# Patient Record
Sex: Male | Born: 1940 | Race: Black or African American | Hispanic: No | Marital: Married | Smoking: Never smoker
Health system: Southern US, Community
[De-identification: ages and names within clinical notes are randomized; demographics above are authoritative.]

## PROBLEM LIST (undated history)

## (undated) DIAGNOSIS — E119 Type 2 diabetes mellitus without complications: Secondary | ICD-10-CM

## (undated) DIAGNOSIS — I10 Essential (primary) hypertension: Secondary | ICD-10-CM

## (undated) DIAGNOSIS — I509 Heart failure, unspecified: Secondary | ICD-10-CM

## (undated) DIAGNOSIS — I499 Cardiac arrhythmia, unspecified: Secondary | ICD-10-CM

## (undated) DIAGNOSIS — B999 Unspecified infectious disease: Secondary | ICD-10-CM

## (undated) DIAGNOSIS — K859 Acute pancreatitis without necrosis or infection, unspecified: Secondary | ICD-10-CM

## (undated) HISTORY — PX: CHOLECYSTECTOMY: SHX55

## (undated) HISTORY — PX: ABOVE KNEE LEG AMPUTATION: SUR20

## (undated) HISTORY — PX: EYE SURGERY: SHX253

---

## 2003-05-09 ENCOUNTER — Other Ambulatory Visit: Payer: Self-pay

## 2003-05-12 ENCOUNTER — Other Ambulatory Visit: Payer: Self-pay

## 2003-05-13 ENCOUNTER — Other Ambulatory Visit: Payer: Self-pay

## 2003-08-13 ENCOUNTER — Other Ambulatory Visit: Payer: Self-pay

## 2004-10-11 ENCOUNTER — Ambulatory Visit: Payer: Self-pay | Admitting: Specialist

## 2005-08-26 ENCOUNTER — Other Ambulatory Visit: Payer: Self-pay

## 2005-08-26 ENCOUNTER — Inpatient Hospital Stay: Payer: Self-pay | Admitting: Specialist

## 2011-04-17 ENCOUNTER — Ambulatory Visit: Payer: Self-pay | Admitting: Internal Medicine

## 2011-10-27 DIAGNOSIS — E785 Hyperlipidemia, unspecified: Secondary | ICD-10-CM | POA: Insufficient documentation

## 2011-10-27 DIAGNOSIS — I4891 Unspecified atrial fibrillation: Secondary | ICD-10-CM | POA: Insufficient documentation

## 2011-10-27 DIAGNOSIS — K219 Gastro-esophageal reflux disease without esophagitis: Secondary | ICD-10-CM | POA: Insufficient documentation

## 2011-10-27 DIAGNOSIS — E7800 Pure hypercholesterolemia, unspecified: Secondary | ICD-10-CM | POA: Insufficient documentation

## 2012-02-19 DIAGNOSIS — I10 Essential (primary) hypertension: Secondary | ICD-10-CM | POA: Insufficient documentation

## 2012-02-19 DIAGNOSIS — J45909 Unspecified asthma, uncomplicated: Secondary | ICD-10-CM | POA: Insufficient documentation

## 2012-02-20 ENCOUNTER — Emergency Department: Payer: Self-pay | Admitting: Emergency Medicine

## 2012-02-20 LAB — COMPREHENSIVE METABOLIC PANEL
Albumin: 3.5 g/dL (ref 3.4–5.0)
Alkaline Phosphatase: 66 U/L (ref 50–136)
Anion Gap: 11 (ref 7–16)
Calcium, Total: 9.1 mg/dL (ref 8.5–10.1)
Co2: 24 mmol/L (ref 21–32)
EGFR (Non-African Amer.): 45 — ABNORMAL LOW
Glucose: 146 mg/dL — ABNORMAL HIGH (ref 65–99)
Osmolality: 296 (ref 275–301)
Potassium: 4.1 mmol/L (ref 3.5–5.1)
SGOT(AST): 67 U/L — ABNORMAL HIGH (ref 15–37)

## 2012-02-20 LAB — CBC
HCT: 45.2 % (ref 40.0–52.0)
HGB: 15.4 g/dL (ref 13.0–18.0)
MCHC: 33.9 g/dL (ref 32.0–36.0)
MCV: 89 fL (ref 80–100)
RDW: 14.6 % — ABNORMAL HIGH (ref 11.5–14.5)
WBC: 9.2 10*3/uL (ref 3.8–10.6)

## 2012-02-22 ENCOUNTER — Observation Stay: Payer: Self-pay | Admitting: Internal Medicine

## 2012-02-22 LAB — CBC WITH DIFFERENTIAL/PLATELET
Basophil #: 0.1 10*3/uL (ref 0.0–0.1)
Eosinophil %: 0.6 %
HCT: 44.3 % (ref 40.0–52.0)
Lymphocyte #: 2.5 10*3/uL (ref 1.0–3.6)
Lymphocyte %: 26 %
MCH: 30.2 pg (ref 26.0–34.0)
MCHC: 33.7 g/dL (ref 32.0–36.0)
Monocyte %: 12.1 %
Neutrophil %: 60.6 %
RDW: 14.3 % (ref 11.5–14.5)

## 2012-02-22 LAB — BASIC METABOLIC PANEL
Calcium, Total: 9.1 mg/dL (ref 8.5–10.1)
Co2: 29 mmol/L (ref 21–32)
Creatinine: 1.69 mg/dL — ABNORMAL HIGH (ref 0.60–1.30)
EGFR (African American): 47 — ABNORMAL LOW
EGFR (Non-African Amer.): 40 — ABNORMAL LOW
Glucose: 124 mg/dL — ABNORMAL HIGH (ref 65–99)
Osmolality: 296 (ref 275–301)
Sodium: 146 mmol/L — ABNORMAL HIGH (ref 136–145)

## 2012-02-22 LAB — CK TOTAL AND CKMB (NOT AT ARMC)
CK, Total: 92 U/L (ref 35–232)
CK, Total: 92 U/L (ref 35–232)
CK-MB: 1.4 ng/mL (ref 0.5–3.6)
CK-MB: 1.3 ng/mL (ref 0.5–3.6)

## 2012-02-22 LAB — HEPATIC FUNCTION PANEL A (ARMC)
Alkaline Phosphatase: 61 U/L (ref 50–136)
Bilirubin, Direct: 0.4 mg/dL — ABNORMAL HIGH (ref 0.00–0.20)
Bilirubin,Total: 1.4 mg/dL — ABNORMAL HIGH (ref 0.2–1.0)

## 2012-02-22 LAB — TROPONIN I: Troponin-I: 0.03 ng/mL

## 2012-02-22 LAB — PRO B NATRIURETIC PEPTIDE: B-Type Natriuretic Peptide: 3786 pg/mL — ABNORMAL HIGH (ref 0–125)

## 2012-02-23 LAB — BASIC METABOLIC PANEL
Anion Gap: 11 (ref 7–16)
BUN: 23 mg/dL — ABNORMAL HIGH (ref 7–18)
Chloride: 108 mmol/L — ABNORMAL HIGH (ref 98–107)
Co2: 24 mmol/L (ref 21–32)
Creatinine: 1.41 mg/dL — ABNORMAL HIGH (ref 0.60–1.30)
EGFR (African American): 58 — ABNORMAL LOW
EGFR (Non-African Amer.): 50 — ABNORMAL LOW
Glucose: 103 mg/dL — ABNORMAL HIGH (ref 65–99)
Osmolality: 289 (ref 275–301)
Potassium: 3.8 mmol/L (ref 3.5–5.1)
Sodium: 143 mmol/L (ref 136–145)

## 2012-02-23 LAB — CBC WITH DIFFERENTIAL/PLATELET
Basophil #: 0.1 10*3/uL (ref 0.0–0.1)
Basophil %: 0.8 %
Eosinophil %: 1.2 %
HCT: 43.5 % (ref 40.0–52.0)
HGB: 14.3 g/dL (ref 13.0–18.0)
Lymphocyte #: 2.5 10*3/uL (ref 1.0–3.6)
Lymphocyte %: 31.7 %
MCH: 29.5 pg (ref 26.0–34.0)
MCV: 90 fL (ref 80–100)
Monocyte #: 0.9 x10 3/mm (ref 0.2–1.0)
Monocyte %: 10.9 %
RBC: 4.84 10*6/uL (ref 4.40–5.90)
RDW: 14.2 % (ref 11.5–14.5)
WBC: 8 10*3/uL (ref 3.8–10.6)

## 2012-02-23 LAB — CK TOTAL AND CKMB (NOT AT ARMC)
CK, Total: 87 U/L (ref 35–232)
CK-MB: 1.6 ng/mL (ref 0.5–3.6)

## 2012-02-23 LAB — LIPID PANEL: VLDL Cholesterol, Calc: 20 mg/dL (ref 5–40)

## 2012-02-23 LAB — HEMOGLOBIN A1C: Hemoglobin A1C: 6.6 % — ABNORMAL HIGH (ref 4.2–6.3)

## 2012-02-23 LAB — TROPONIN I: Troponin-I: <0.02 ng/mL

## 2012-02-24 LAB — CBC WITH DIFFERENTIAL/PLATELET
Basophil %: 1.1 %
Eosinophil %: 1.3 %
HGB: 14.7 g/dL (ref 13.0–18.0)
Lymphocyte #: 2 10*3/uL (ref 1.0–3.6)
Lymphocyte %: 28.9 %
MCH: 29.7 pg (ref 26.0–34.0)
MCV: 90 fL (ref 80–100)
Monocyte #: 0.7 x10 3/mm (ref 0.2–1.0)
Monocyte %: 9.5 %
Neutrophil %: 59.2 %
Platelet: 108 10*3/uL — ABNORMAL LOW (ref 150–440)
RBC: 4.95 10*6/uL (ref 4.40–5.90)
WBC: 7 10*3/uL (ref 3.8–10.6)

## 2012-02-24 LAB — BASIC METABOLIC PANEL
Anion Gap: 9 (ref 7–16)
Calcium, Total: 9.1 mg/dL (ref 8.5–10.1)
Co2: 26 mmol/L (ref 21–32)
Creatinine: 1.47 mg/dL — ABNORMAL HIGH (ref 0.60–1.30)
EGFR (African American): 55 — ABNORMAL LOW
EGFR (Non-African Amer.): 48 — ABNORMAL LOW
Glucose: 120 mg/dL — ABNORMAL HIGH (ref 65–99)

## 2012-02-25 LAB — CULTURE, BLOOD (SINGLE)

## 2012-03-01 ENCOUNTER — Ambulatory Visit: Payer: Self-pay | Admitting: Internal Medicine

## 2013-07-29 DIAGNOSIS — I429 Cardiomyopathy, unspecified: Secondary | ICD-10-CM | POA: Insufficient documentation

## 2014-08-11 NOTE — H&P (Signed)
PATIENT NAME:  [name] [name], [name] [name] MR#:  [id number] DATE OF BIRTH:  1940/07/19  DATE OF ADMISSION:  02/22/2012  REFERRING PHYSICIAN: Dr. Leavy Cella  PRIMARY CARE PHYSICIAN: Dr. Leavy Cella   HISTORY OF PRESENT ILLNESS: The patient is a pleasant 74 year old African American male with history of atrial fibrillation, diabetes, hypertension, pancreatitis, and diabetic ketoacidosis in the past who presents with shortness of breath. The patient states his symptoms started with a cough, which was mostly dry, about 2 to 3 weeks ago. The patient has occasional whitish sputum. About a week ago he started to develop shortness of breath and dyspnea on exertion. The patient also developed progressive orthopnea and currently for the past few nights has been sleeping sitting up in a chair. There are no fevers, chills, or sick contacts.  He went to his primary care physician and was noted to have good oxygen sats per patient and his lungs sounded clear. His symptoms progressed and continued. He went to the ER where x-ray of the chest and BNP supported the possibility of congestive heart failure and the patient was given some doses of Lasix. At that time he had a BNP of 2900 and a negative troponin. However, his creatinine was slightly up. He stated that he did not get significantly better and his symptoms persisted. He went back to his PCP who referred the patient for direct admission here.  For today there are no labs or x-ray or EKG.  These will be ordered and the patient will be admitted to the hospital for progressive dyspnea on exertion and shortness of breath.   PAST MEDICAL HISTORY:  1. Atrial fibrillation. 2. History of pancreatitis. 3. History of diabetic ketoacidosis.  4. Hypertension.  5. Hyperlipidemia.  6. Gastroesophageal reflux disease.  7. History of right lower extremity cellulitis with chronic right lower extremity swelling.   ALLERGIES: None.   SOCIAL HISTORY: No alcohol. No drugs. Does use chewing tobacco.  Married.   PAST SURGICAL HISTORY:  1. Right lower extremity surgery. 2. Cholecystectomy.   FAMILY HISTORY: Brother with diabetes and heart attack and sister with heart attack.   REVIEW OF SYSTEMS: CONSTITUTIONAL: The patient endorses decreased p.o. intake and about 5-pound weight loss in the last several days. No fevers or chills. No pain. EYES: No blurry vision or double vision. ENT: No tinnitus or hearing loss. RESPIRATORY: Cough is mostly dry with occasional sputum production. No hemoptysis. Positive for dyspnea on exertion, orthopnea, and shortness of breath. CARDIOVASCULAR: No chest pain. History of atrial fibrillation and hypertension. GI: No nausea, vomiting, diarrhea, or abdominal pain. ENDOCRINE: Positive for diabetes. No polyuria or nocturia. GU: Denies dysuria or hematuria. HEME/LYMPH: No anemia or easy bruising. SKIN: No new rashes. MUSCULOSKELETAL: No joint pains or muscle pains.  NEUROLOGIC: No numbness, weakness, cerebrovascular accident, or transient ischemic attack.  PSYCH: No anxiety or depression.   PHYSICAL EXAMINATION:  VITAL SIGNS: Temperature 96.1, pulse 82, respiratory rate 18, blood pressure 109/77, oxygen saturation 99% on room air.   GENERAL: The patient is a well-developed [place] American male sitting in bed, talking in full sentences.   HEENT: Normocephalic, atraumatic. Pupils are equal and reactive. Extraocular muscles intact. Anicteric sclerae. Somewhat dry mucous membranes.   NECK: Supple. No thyroid tenderness. No hepatojugular reflux or JVD appreciated. No cervical lymphadenopathy.   CARDIOVASCULAR: S1, S2, regular rate and rhythm. No murmurs, rubs, or gallops.   LUNGS: Clear to auscultation with good air entry bilaterally.  ABDOMEN: Soft, nontender, nondistended. No organomegaly appreciated.   EXTREMITIES: Right lower  extremity- there is a healed scar in the lateral ankle area and right lower extremity appears somewhat increased in diameter compared to  the left lower extremity without tenderness, erythema, or evidence for cellulitis.   NEUROLOGIC: Cranial nerves II through XII grossly intact. Strength is five out of five in all extremities. Sensation intact to light touch.   PSYCH: Pleasant cooperative. Awake, alert, oriented times three.   SKIN: No obvious rashes, otherwise.  LABORATORY DATA:    No labs as of yet for today, but from 10/29 creatinine was 1.53, sodium was 147, chloride was 112.  BNP was 2957. GFR was 53. AST 67, ALT 79, total bilirubin 1.2. Troponin was negative and TSH was 3.24. The patient had no leukocytosis. X-ray of the chest from 10/29 showed the heart mildly enlarged and increased in size from prior, mild increased density at the lung bases felt to be secondary to overlying soft tissue. No focal pulmonary opacities.   ASSESSMENT AND PLAN:  1. We have a pleasant 74 year old [place] American male with hypertension, diabetes, hyperlipidemia, and atrial fibrillation who presents with subacute cough and progressive shortness of breath, dyspnea on exertion, with some lab work suggesting congestive heart failure. However, the patient does not appear to be in overt congestive heart failure. At this point we will admit the patient for direct admission.  We will hold further diuretics at this point as the patient does not appear to be clinical congestive heart failure. X-ray two days ago also did not support significant pleural effusions or pneumonia. We will start the work-up with obtaining fresh labs including BNP, BMP, and CBC. Here the patient is not hypoxic. We will get an echocardiogram and EKG. We will get a lower extremity deep vein thrombosis scan as he does have different lower extremity diameters. However, the patient stated it is chronic, but at this point deep vein thrombosis should be excluded. If GFR is okay, would also consider CT scan with PE protocol to look at the lung parenchyma and rule out pulmonary embolism. We will  cycle the troponins. The patient has no chest pain of note.  2. Acute renal failure. From two days ago GFR seemed to be down from his baseline. He did state that he had poor p.o. intake and has had some weight loss in the recent past. However, at this point I would hold his ACE inhibitor and not diurese him further and see what his GFR looks like and consider gentle IV fluids.  3. Diabetes. We will check a hemoglobin A1c, hold metformin, and start him on sliding scale insulin.  4. Hypertension. His blood pressure is acceptable at this point.  5. History of atrial fibrillation. There is no EKG and the previous EKG from several days ago found flutter. We will obtain another EKG here and consider cardiology consult. The patient did have some LFT abnormalities and we will recheck his hepatic panel here and start him on heparin for deep vein thrombosis prophylaxis.  Further tests depending on his outcome through this hospitalization.   CODE STATUS: FULL CODE.  TOTAL TIME SPENT: 60 minutes.     ____________________________ Krystal Eaton, MD sa:bjt D: 02/22/2012 13:28:00 ET T: 02/22/2012 13:56:23 ET JOB#: [id number]  cc: Krystal Eaton, MD, <Dictator> Rosalyn Gess. Blocker, MD Krystal Eaton MD ELECTRONICALLY SIGNED 02/23/2012 12:47

## 2014-08-11 NOTE — Consult Note (Signed)
PATIENT NAME:  [name] [name], [name] [name] MR#:  [id number] DATE OF BIRTH:  02/05/1941  DATE OF CONSULTATION:  02/22/2012  REFERRING PHYSICIAN:  Orson Aloe, MD CONSULTING PHYSICIAN:  Shedrick Sarli D. Romie Keeble, MD  INDICATION: Atrial fibrillation.   HISTORY OF PRESENT ILLNESS: Mr. Brackin is a 74 year old male with history of atrial fibrillation in the past, diabetes, hypertension, pancreatitis, diabetic ketoacidosis, who states to be doing reasonably well but started having complaints of orthopnea, shortness of breath, vague chest pain. He has had recurrent symptoms of congestion and cough over the last 2 to 3 weeks. He saw his primary physician a few times, went to the Emergency Room, was evaluated, had an EKG and BNP, found to be in mild failure and was treated with Lasix, BNP of 2,900. He had recurrent symptoms and returned to the Emergency Room. He also complains of possible palpitations.   REVIEW OF SYSTEMS: No blackout spells or syncope. No nausea or vomiting. No fever, no chills, no sweats. No weight loss. No weight gain. No hemoptysis or hematemesis. No bright red blood per rectum. No vision change or hearing change. No sputum production. Denies cough.   PAST MEDICAL HISTORY:  1. Atrial fibrillation.  2. Pancreatitis.  3. Diabetic ketoacidosis.  4. Hypertension.  5. Hyperlipidemia.  6. Reflux.  7. Lower extremity edema.   ALLERGIES: None.   SOCIAL HISTORY: No smoking or alcohol consumption. Married. Retired.   PAST SURGICAL HISTORY:  1. Lower extremity. Surgery.  2. Cholecystectomy.   FAMILY HISTORY: Diabetes, heart attack.   PHYSICAL EXAMINATION:  VITAL SIGNS: Blood pressure 110/80, pulse 80, irregular, respiratory rate 18, afebrile.    HEENT: Normocephalic, atraumatic. Pupils equal and reactive to light.   NECK: Supple. No jugular venous distention, bruits, or adenopathy.   LUNGS: Bilateral rhonchi. Mild rales in the bases. No wheezing.   HEART: Irregularly irregular. Systolic  ejection murmur left sternal border. PMI is nondisplaced.   ABDOMEN: Benign.   EXTREMITIES: Within normal limits.   NEUROLOGIC: Intact.   SKIN: Normal. No significant edema.   LABORATORY, RADIOLOGICAL AND DIAGNOSTIC DATA: TSH 2.24. Chest x-ray: Cardiomegaly, borderline failure, creatinine 1.53, sodium 147. BNP 2,957. EKG: Atrial fibrillation, controlled response at about 90.   ASSESSMENT:  1. Atrial fibrillation, nonspecific ST-T wave changes.  2. Renal insufficiency. 3. Heart failure. 4. Diabetes. 5. Hypertension. 6. Mild heart failure.   PLAN:  1. Agree with admit.  2. Place on telemetry.  3. Continue rate control.  4. Consider anticoagulation.  5. Would consider long-term therapy either with Coumadin or Xarelto. He appears to not have any contraindications. It is not clear how long he has had atrial fibrillation.  6. Would continue Lasix therapy. 7. Consider antiarrhythmic, possibly Rythmol or amiodarone.  8. Continue current therapy with ACE inhibitors as necessary.  9. Follow-up echocardiogram to assess LV function and wall motion.  10. Continue hypertension control. Again, presumably with an ACE to began and beta blocker. Add anticoagulation.  11. Continue diabetes management.  12. Reflux therapy.  13. Base further evaluation on results of his echocardiogram as well as antiarrhythmic therapy. Would not cardiovert at this point. Would probably anticoagulate for 3 to 4 weeks.  14. Would consider transesophageal echocardiogram.   ____________________________ Bobbie Stack. Juliann Pares, MD ddc:ap D: 02/22/2012 23:51:52 ET T: 02/23/2012 07:37:48 ET JOB#: [id number]  cc: Daesha Insco D. Juliann Pares, MD, <Dictator> Alwyn Pea MD ELECTRONICALLY SIGNED 03/26/2012 16:06

## 2014-08-11 NOTE — Consult Note (Signed)
Chief Complaint:   Subjective/Chief Complaint Pt states to have improved sob but no cp. He denies palp. He still has some fatigue.   VITAL SIGNS/ANCILLARY NOTES: **Vital Signs.:   01-Nov-13 08:00   Vital Signs Type Routine   Temperature Temperature (F) 97.6   Celsius 36.4   Temperature Source oral   Pulse Pulse 98   Respirations Respirations 18   Systolic BP Systolic BP 130   Diastolic BP (mmHg) Diastolic BP (mmHg) 83   Mean BP 98   Pulse Ox % Pulse Ox % 94   Pulse Ox Activity Level  At rest   Oxygen Delivery Room Air/ 21 %  *Intake and Output.:   01-Nov-13 08:15   Grand Totals Intake:  240 Output:      Net:  240 24 Hr.:  240   Oral Intake      In:  240   Percentage of Meal Eaten  75   Brief Assessment:   Cardiac Irregular  murmur present  -- carotid bruits  -- LE edema  + JVD  --Gallop    Respiratory normal resp effort  rhonchi    Gastrointestinal Normal    Gastrointestinal details normal Soft  Nontender  Nondistended  No masses palpable   Lab Results: Routine Chem:  01-Nov-13 04:44    Glucose, Serum  103   BUN  23   Creatinine (comp)  1.41   Sodium, Serum 143   Potassium, Serum 3.8   Chloride, Serum  108   CO2, Serum 24   Calcium (Total), Serum 9.2   Anion Gap 11   Osmolality (calc) 289   eGFR (African American)  58   eGFR (Non-African American)  50 (eGFR values <2mL/min/1.73 m2 may be an indication of chronic kidney disease (CKD). Calculated eGFR is useful in patients with stable renal function. The eGFR calculation will not be reliable in acutely ill patients when serum creatinine is changing rapidly. It is not useful in  patients on dialysis. The eGFR calculation may not be applicable to patients at the low and high extremes of body sizes, pregnant women, and vegetarians.)   Magnesium, Serum 2.1 (1.8-2.4 THERAPEUTIC RANGE: 4-7 mg/dL TOXIC: > 10 mg/dL  -----------------------)   Hemoglobin A1c ([facility])  6.6 (The American Diabetes Association  recommends that a primary goal of therapy should be <7% and that physicians should reevaluate the treatment regimen in patients with HbA1c values consistently >8%.)   Cholesterol, Serum 128   Triglycerides, Serum 98   HDL (INHOUSE)  32   VLDL Cholesterol Calculated 20   LDL Cholesterol Calculated 76 (Result(s) reported on 23 Feb 2012 at 05:58AM.)  Cardiac:  01-Nov-13 04:44    CK, Total 87   CPK-MB, Serum 1.6 (Result(s) reported on 23 Feb 2012 at [facility].)   Troponin I < 0.02 (0.00-0.05 0.05 ng/mL or less: NEGATIVE  Repeat testing in 3-6 hrs  if clinically indicated. >0.05 ng/mL: POTENTIAL  MYOCARDIAL INJURY. Repeat  testing in 3-6 hrs if  clinically indicated. NOTE: An increase or decrease  of 30% or more on serial  testing suggests a  clinically important change)  Routine Hem:  01-Nov-13 04:44    WBC (CBC) 8.0   RBC (CBC) 4.84   Hemoglobin (CBC) 14.3   Hematocrit (CBC) 43.5   Platelet Count (CBC)  103   MCV 90   MCH 29.5   MCHC 32.8   RDW 14.2   Neutrophil % 55.4   Lymphocyte % 31.7   Monocyte % 10.9  Eosinophil % 1.2   Basophil % 0.8   Neutrophil # 4.4   Lymphocyte # 2.5   Monocyte # 0.9   Eosinophil # 0.1   Basophil # 0.1 (Result(s) reported on 23 Feb 2012 at 05:31AM.)   Radiology Results: XRay:    31-Oct-13 13:24, Chest PA and Lateral   Chest PA and Lateral    REASON FOR EXAM:    shortness of breath  COMMENTS:       PROCEDURE: DXR - DXR CHEST PA (OR AP) AND LATERAL  - Feb 22 2012  1:24PM     RESULT: Comparison is made to the study of 20 February 2012.    Cardiac silhouette is at the upper limits of normal. The lungs appear   fully inflated and clear. There is no edema, effusion, infiltrate or mass.    IMPRESSION:  No acute cardiopulmonary disease. Mild hyperinflation with   borderline cardiomegaly.    Dictation Site: 2      Verified By: Sundra Aland, M.D., MD  [place]:    31-Oct-13 14:11, [place] Color Flow Doppler Low Extrem Bilat   [place] Color Flow  Doppler Low Extrem Bilat    REASON FOR EXAM:    eval for dvt  COMMENTS:       PROCEDURE: [place]  - US DOPPLER LOW EXTR BILATERAL  - Feb 22 2012  2:11PM     RESULT: Duplex Doppler interrogation of the deep venous system of both   legs from the inguinal to the popliteal region demonstrates the deep   venous systems are fully compressible throughout. The color Doppler and   spectral Doppler appearance is normal. There is normal response to distal   augmentation. The color Doppler images show no filling defect.    IMPRESSION:    1.No evidence of DVT in either lower extremity.    Dictation Site: 2    Verified By: Sundra Aland, M.D., MD  Cardiology:    31-Oct-13 13:36, ECG   Ventricular Rate 91   Atrial Rate 91   P-R Interval 164   QRS Duration 116   QT 388   QTc 477   R Axis 56   T Axis 117   ECG interpretation    Normal sinus rhythm  Nonspecific T wave abnormality  Prolonged QT  Abnormal ECG  When compared with ECG of 20-Feb-2012 03:06,  Sinus rhythm has replaced Atrial flutter  Nonspecific T wave abnormality, worse in Inferior leads  Confirmed by Humphrey Rolls, SHAUKAT ([id number]) on 02/22/2012 5:34:46 PM    Overreader: Neoma Laming   ECG     31-Oct-13 14:20, Echo Doppler   Echo Doppler    Interpretation Summary    The left ventricle is moderately dilated. There is no thrombus. Left   ventricular systolic function is moderate to severely reduced.   Ejection Fraction = 25-35%. There is mild concentric left   ventricular hypertrophy. There is moderate to severe global   hypokinesis of the left ventricle. The right ventricle is mildly   dilated. There is severe mitral regurgitation. There is moderate to   severe tricuspid regurgitation. Right ventricular systolic pressure   is elevated at 30-27mmHg.    Procedure:    A two-dimensional transthoracic echocardiogram with color flow and   Doppler was performed.    Left Ventricle    Left ventricular systolic function is moderate to  severely reduced.    Ejection Fraction = 25-35%.    There is moderate to severe global hypokinesis of the left  ventricle.    The left ventricle is moderately dilated.    There is no thrombus.    There is mild concentric left ventricular hypertrophy.    Right Ventricle    The right ventricle is mildly dilated.    There is normal right ventricular wall thickness.    The right ventricular systolic function is normal.    Atria    The left atrium is mildly dilated.    Borderline right atrial enlargement.    Mitral Valve    The mitral valve leaflets appear thickened, but open well.    There is severe mitral regurgitation.    Tricuspid Valve    The tricuspid valve is not well visualized, but is grossly normal.    There is moderate to severe tricuspid regurgitation.    Right ventricular systolic pressure is elevated at 30-61mmHg.    Aortic Valve    The aortic valve is normal in structure and function.    No aortic regurgitation is present.    Pulmonic Valve    The pulmonic valve is not well seen, but is grossly normal.    There is no pulmonic valvular regurgitation.    Great Vessels    The aortic root is not well visualized but is probably normal size.    Pericardium/Pleural    There is no pleural effusion.    No pericardial effusion.    MMode 2D Measurements and Calculations    RVDd: 3.4 cm    IVSd: 1.3 cm    LVIDd: 5.1 cm    LVIDs: 4.3 cm    LVPWd: 1.4 cm    FS: 16 %    EF(Teich): 34 %    Ao root diam: 2.9 cm    LA dimension: 4.4 cm    LVOT diam: 2.0 cm    Doppler Measurements and Calculations    MV E point: 118 cm/sec    MV dec time: 0.15sec    Ao V2 max: 96 cm/sec    Ao max PG: 4.0 mmHg    AVA(V,D): 2.1 cm2    LV max PG: 2.0 mmHg    LV V1 max: 65 cm/sec    MR max vel: 420 cm/sec    MR max PG: 71 mmHg    MR mean vel: 303 cm/sec    MR mean PG: 42 mmHg    MR VTI: 108 cm    MR PISA: 6.3cm2    MR ERO: 0.85 cm2    MR volume: 91 ml    MR PISA radius: 1.0 cm     MR alias vel: 57 cm/sec    PA V2 max: 65 cm/sec    PA max PG: 2.0 mmHg    TR Max vel: 269 cm/sec    TR Max PG: 29 mmHg    RVSP: 34 mmHg    RAP systole: 5.0 mmHg    Reading Physician: Lujean Amel   Sonographer: Sherrie Sport  Interpreting Physician:  Lujean Amel,  electronically signed on   02-23-2012 15:27:11  Requesting Physician: Lujean Amel  Nuclear Med:    01-Nov-13 14:35, Lung VQ Scan - Nuc Med   Lung VQ Scan - Nuc Med    REASON FOR EXAM:    sob  COMMENTS:       PROCEDURE: NM  - NM VQ LUNG SCAN  - Feb 23 2012  2:35PM     RESULT: Comparison: 02/22/2012    Radiopharmaceutical: 40.35 mCi Tc-42m DTPA inhaled gas via a nebulizer   for  ventilation; 4.13 mCi Tc-47m MAA administered intravenously for   perfusion.    Technique: Standard anterior, posterior, and oblique images were obtained   for the ventilation study; anterior, posterior, and oblique images were   obtained for the perfusion study. The ventilation and perfusion studies   were compared with a recent chest radiograph.  Findings:   There is a small ventilation defect in the posterior aspect of the right   lateral view which is matched on the perfusion study. There is a small   perfusion defect a along the posterior aspect of the right RPO view which   is matched with the ventilation images. Decreased perfusion to the lung   apices is matched with the ventilation images. No mismatched segmental   perfusion defects identified.    IMPRESSION:   Low probability for pulmonary embolus.    Dictation site: 2          Verified By: Gregor Hams, M.D., MD   Assessment/Plan:  Invasive Device Daily Assessment of Necessity:   Does the patient currently have any of the following indwelling devices? none   Assessment/Plan:   Assessment IMP AFIB SOB HTN CHF CM Hyperchol CRI DM    Plan .PLAN Control AFIB with rate control, Coreg bid Consider Digoxin low dose 0.125 daily Low dose ACE for CM  and CHF Lisinopril 5 daily HTN control with Coreg/ACE/Lasix Consider Rhythmol for antiarrthytmic control He will need long term anticougulation I rec cardiac cath for CHF/CM/R/o CAD Refer to Nephrology for CRI Continue DM  Consider cardiac cath as outpt Lipid management with statin   Electronic Signatures: Yolonda Kida (MD)  (Signed [id number] 09:15)  Authored: Chief Complaint, VITAL SIGNS/ANCILLARY NOTES, Brief Assessment, Lab Results, Radiology Results, Assessment/Plan   Last Updated: 02-Nov-13 09:15 by Lujean Amel D (MD)

## 2014-08-11 NOTE — Discharge Summary (Signed)
PATIENT NAME:  [name] [name], [name] [name] MR#:  [id number] DATE OF BIRTH:  12/19/1940  DATE OF ADMISSION:  02/22/2012 DATE OF DISCHARGE:  02/24/2012   PRIMARY MEDICAL DOCTOR:  Dr Leavy Cella.   DISCHARGE DIAGNOSES: Atrial fibrillation, congestive heart failure exacerbation, acute systolic heart failure.   HISTORY OF PRESENT ILLNESS:  The patient is a 74 year old African American male with history of atrial fibrillation, Diabetes mellitus, hypertension, pancreatitis and diabetic ketoacidosis in the past presented with shortness of breath. He states that his symptoms started with cough which was mostly dry, about 2 to 3 weeks ago associated with wheat-colored sputum. About a week ago he started to develop shortness of breath and dyspnea on exertion. The patient also developed weakness, orthopnea, and currently the past few nights has been sleeping sitting up in the chair. There was no fever or chills or sick contacts. He went to his primary care physician who noted to have good oxygen saturations. But the patient in his lungs sounded clear. His symptoms progressed and continued. He went to Emergency Room where x-ray of the chest and BNP supported the  possibility of congestive heart failure and he was admitted with diagnosis of acute heart failure.  HOSPITAL STAY: After getting admitted with congestive heart failure exacerbation with elevated BNP, he was given IV Lasix and he had significant diuretics and improvement in his symptoms. He was also noted having atrial fibrillation and so monitored on telemetry. His echocardiogram was done and it reported left ventricle moderately elevated, no thrombus. left ventricle systolic function moderately to severely  reduced, ejection fraction of 25 to 35%, mild concentric left ventricular hypertrophy and moderate to severe global hypokinesis of the left ventricle, right ventricle mildly dilated, severe mitral regurgitation and moderate to severe tricuspid regurgitation. Right  ventricular systolic pressure is elevated at 30 to 40 mm HG.   Cardiology consult was done for his atrial fibrillation with tachycardia and for congestive heart failure. Dr Juliann Pares saw the patient during the hospital and he suggested the patient to be  rate controlled for his atrial fibrillation with beta blockers and digoxin. He suggested to start on lisinopril for congestive heart failure and give Lasix as needed basis. He also wanted to go cardiac catheterization for the patient to  find out the cause for his congestive heart failure, but  he suggested to get it done as an outpatient and so not to start the patient on any anticoagulation therapy for atrial fibrillation. I had a discussion with Dr. Juliann Pares and he suggested  discharge the patient without anticoagulation at this time he is afebrile. He will try to get his cardiac catheterization done within the next 2 to 3 days as an outpatient basis and then he will start on anticoagulation. The patient was discharged with these instructions at home.   OTHER MEDICAL ISSUES ADDRESSED DURING THIS HOSPITAL ADMISSION:  1. Acute renal failure, his creatinine was 1.69 on presentation, which came down up to 1.47 and so we presumed that he has acute on chronic renal failure secondary to his hypertension and diabetes mellitus and advised him to follow as outpatient in nephrologic clinic.  2. Diabetes mellitus was managed on insulin sliding scale. His HbA1c was 6.6, and blood sugar remained under control with only diet.  3. Hypertension. Blood pressure was acceptable and he was on cardiac medications like Coreg, lisinopril.  4. History of atrial fibrillation, as mentioned about cardiology consult was done and they suggested to start him on Coreg and digoxin not to start on  any anticoagulation until  cardiac catheterization is done as outpatient, which he will get within the next 2 to 3 days. 5. Episode of confusion and numbness. During the hospital stay he woke up  around 3:00 a.m. with episode of confusion and having numbness in his arm and he said that he had a difficulty in finding words also. We got CT of the head done, which is reported as no acute intracranial abnormality evident but I explained to him that he is high risk of having stroke or transient ischemic attack because of atrial fibrillation and being discharged without anticoagulation because of planning for cardiac cath in near future and he agreed with these risks and understand the plan   CODE STATUS: FULL CODE.   CONDITION ON DISCHARGE: Satisfactory.  MEDICATIONS ON DISCHARGE:  Omeprazole 40 mg oral daily. Aspirin 81 mg oral daily, lisinopril 5 mg oral daily. Digoxin 125 mg oral once a day. Carvedilol 3.125 mg oral 2 times a day. Furosemide 20 mg oral once a day.   DIET:  He was advised carbohydrate controlled diet and total 1 liter of fluid in a day.  FOLLOWUP: Also advised to follow up within 1 to 2 weeks with his primary medical doctor and call Dr. Glennis Brink office within the next 1-2 days to schedule his outpatient cardiac cath  and follow up with Nephrology as an outpatient.   Total Time Spent ON this discharge: 45 minutes    ____________________________ Hope Pigeon. Elisabeth Pigeon, MD [id number] D: 02/25/2012 23:15:39 ET T: 02/26/2012 11:51:32 ET JOB#: [id number]  cc: Hope Pigeon. Elisabeth Pigeon, MD, <Dictator> Altamese Dilling MD ELECTRONICALLY SIGNED 03/12/2012 22:01

## 2014-10-05 DIAGNOSIS — E780 Pure hypercholesterolemia: Secondary | ICD-10-CM | POA: Diagnosis not present

## 2014-10-05 DIAGNOSIS — I10 Essential (primary) hypertension: Secondary | ICD-10-CM | POA: Diagnosis not present

## 2014-10-05 DIAGNOSIS — I480 Paroxysmal atrial fibrillation: Secondary | ICD-10-CM | POA: Diagnosis not present

## 2014-10-05 DIAGNOSIS — E119 Type 2 diabetes mellitus without complications: Secondary | ICD-10-CM | POA: Diagnosis not present

## 2014-10-05 DIAGNOSIS — Z125 Encounter for screening for malignant neoplasm of prostate: Secondary | ICD-10-CM | POA: Diagnosis not present

## 2014-10-20 DIAGNOSIS — E119 Type 2 diabetes mellitus without complications: Secondary | ICD-10-CM | POA: Diagnosis not present

## 2014-10-20 DIAGNOSIS — I429 Cardiomyopathy, unspecified: Secondary | ICD-10-CM | POA: Diagnosis not present

## 2014-10-20 DIAGNOSIS — I480 Paroxysmal atrial fibrillation: Secondary | ICD-10-CM | POA: Diagnosis not present

## 2014-10-20 DIAGNOSIS — E669 Obesity, unspecified: Secondary | ICD-10-CM | POA: Diagnosis not present

## 2014-11-23 DIAGNOSIS — I480 Paroxysmal atrial fibrillation: Secondary | ICD-10-CM | POA: Diagnosis not present

## 2014-11-23 DIAGNOSIS — I10 Essential (primary) hypertension: Secondary | ICD-10-CM | POA: Diagnosis not present

## 2014-11-23 DIAGNOSIS — E1165 Type 2 diabetes mellitus with hyperglycemia: Secondary | ICD-10-CM | POA: Diagnosis not present

## 2014-11-23 DIAGNOSIS — I429 Cardiomyopathy, unspecified: Secondary | ICD-10-CM | POA: Diagnosis not present

## 2015-03-24 DIAGNOSIS — I429 Cardiomyopathy, unspecified: Secondary | ICD-10-CM | POA: Diagnosis not present

## 2015-03-24 DIAGNOSIS — E669 Obesity, unspecified: Secondary | ICD-10-CM | POA: Diagnosis not present

## 2015-03-24 DIAGNOSIS — I5042 Chronic combined systolic (congestive) and diastolic (congestive) heart failure: Secondary | ICD-10-CM | POA: Diagnosis not present

## 2015-03-24 DIAGNOSIS — E119 Type 2 diabetes mellitus without complications: Secondary | ICD-10-CM | POA: Diagnosis not present

## 2015-03-24 DIAGNOSIS — N182 Chronic kidney disease, stage 2 (mild): Secondary | ICD-10-CM | POA: Diagnosis not present

## 2015-03-24 DIAGNOSIS — K219 Gastro-esophageal reflux disease without esophagitis: Secondary | ICD-10-CM | POA: Diagnosis not present

## 2015-03-24 DIAGNOSIS — I480 Paroxysmal atrial fibrillation: Secondary | ICD-10-CM | POA: Diagnosis not present

## 2015-03-24 DIAGNOSIS — I10 Essential (primary) hypertension: Secondary | ICD-10-CM | POA: Diagnosis not present

## 2015-03-27 ENCOUNTER — Ambulatory Visit
Admission: EM | Admit: 2015-03-27 | Discharge: 2015-03-27 | Disposition: A | Discharge status: Home / Self Care | Payer: Commercial Managed Care - HMO | Attending: Family Medicine | Admitting: Family Medicine

## 2015-03-27 DIAGNOSIS — S0502XA Injury of conjunctiva and corneal abrasion without foreign body, left eye, initial encounter: Secondary | ICD-10-CM | POA: Diagnosis not present

## 2015-03-27 HISTORY — DX: Type 2 diabetes mellitus without complications: E11.9

## 2015-03-27 HISTORY — DX: Essential (primary) hypertension: I10

## 2015-03-27 MED ORDER — ERYTHROMYCIN 5 MG/GM OP OINT: TOPICAL_OINTMENT | OPHTHALMIC | Status: DC

## 2015-03-27 NOTE — Discharge Instructions (Signed)

## 2015-03-27 NOTE — ED Provider Notes (Signed)
CSN: [id number]     Arrival date & time 03/27/15  1309 History   First MD Initiated Contact with Patient 03/27/15 1358     Chief Complaint  Patient presents with  . Eye Pain   HPI  [name] [name] is a pleasant 74 y.o. male who present with left eye pain. He states he was blowing and chopping Lees in his yard and hour ago. He meal he felt like something hit his left eye. He has sensation of foreign body. He has not noticed any discharge from his eye. He has a discomfort which he rates 1/10 on pain scale. He has noted any visual changes.  He denies any aggravating or relieving factors. He has not tried anything for the pain.  Past Medical History  Diagnosis Date  . Diabetes (HCC)   . HTN (hypertension)    Past Surgical History  Procedure Laterality Date  . Cholecystectomy     History reviewed. No pertinent family history. Social History  Substance Use Topics  . Smoking status: Never Smoker   . Smokeless tobacco: Current User  . Alcohol Use: No    Review of Systems  Constitutional: Negative.   HENT: Negative.   Eyes: Positive for pain and redness. Negative for photophobia, discharge, itching and visual disturbance.  Respiratory: Negative.   Cardiovascular: Negative.   Gastrointestinal: Negative.   Genitourinary: Negative.   Musculoskeletal: Negative.   Skin: Negative.   Neurological: Negative.   Psychiatric/Behavioral: Negative.     Allergies  Review of patient's allergies indicates no known allergies.  Home Medications   Prior to Admission medications   Medication Sig Start Date End Date Taking? Authorizing Provider  lisinopril (PRINIVIL,ZESTRIL) 10 MG tablet Take 10 mg by mouth daily.   Yes Historical Provider, MD  metFORMIN (GLUCOPHAGE) 1000 MG tablet Take 1,000 mg by mouth 2 (two) times daily with a meal.   Yes Historical Provider, MD  Rivaroxaban (XARELTO) 15 MG TABS tablet Take 15 mg by mouth daily with supper.   Yes Historical Provider, MD  erythromycin ophthalmic  ointment Place a 1/2 inch ribbon of ointment into the lower eyelid 5 times per day 03/27/15   Joselyn Arrow, NP   Meds Ordered and Administered this Visit  Medications - No data to display  BP 128/83 mmHg  Pulse 93  Temp(Src) 97.5 F (36.4 C)  Resp 15  Ht 6' (1.829 m)  Wt 224 lb (101.606 kg)  BMI 30.37 kg/m2  SpO2 98% No data found.   Physical Exam  Constitutional: He is oriented to person, place, and time. He appears well-developed and well-nourished. No distress.  HENT:  Head: Normocephalic and atraumatic.  Eyes: EOM and lids are normal. Pupils are equal, round, and reactive to light. Lids are everted and swept, no foreign bodies found. Right conjunctiva is not injected. Left conjunctiva is injected. Left conjunctiva has no hemorrhage. No scleral icterus.  Fundoscopic exam:      The right eye shows no arteriolar narrowing, no AV nicking, no exudate, no hemorrhage and no papilledema. The right eye shows red reflex.       The left eye shows no arteriolar narrowing, no AV nicking, no exudate, no hemorrhage and no papilledema. The left eye shows red reflex.  Slit lamp exam:      The right eye shows no corneal abrasion.       The left eye shows corneal abrasion.    Neck: Normal range of motion.  Cardiovascular: Normal rate and regular rhythm.  Pulmonary/Chest: Effort normal and breath sounds normal. No respiratory distress.  Abdominal: Soft. Bowel sounds are normal. He exhibits no distension.  Musculoskeletal: Normal range of motion. He exhibits no edema or tenderness.  Neurological: He is alert and oriented to person, place, and time. No cranial nerve deficit.  Skin: Skin is warm and dry. No rash noted. No erythema.  Psychiatric: His behavior is normal. Judgment normal.  Nursing note and vitals reviewed.   ED Course  Procedures (including critical care time)  Labs Review Labs Reviewed - No data to display  Imaging Review No results found.   Visual Acuity  Review  Right Eye Distance:   Left Eye Distance:   Bilateral Distance:    Right Eye Near:   Left Eye Near:    Bilateral Near:     MDM   1. Corneal abrasion, left, initial encounter    Plan: Test results and diagnosis reviewed with patient Rx as per orders;  benefits, risks, potential side effects reviewed Patient advised to call [facility] Monday morning for reevaluation or sooner if severe pain or visual changes  Joselyn Arrow, NP 03/27/15 1436

## 2015-03-27 NOTE — ED Notes (Signed)
Patient complains of left eye pain. States that he was chopping up leaves and something flew in his eye. He states that he is unable to get it out at home and is driving him crazy. He states that area is painful when he moves his eye. Patient states that this incident happened about 1 hour ago.

## 2015-04-14 DIAGNOSIS — I10 Essential (primary) hypertension: Secondary | ICD-10-CM | POA: Diagnosis not present

## 2015-04-14 DIAGNOSIS — E7800 Pure hypercholesterolemia, unspecified: Secondary | ICD-10-CM | POA: Diagnosis not present

## 2015-04-14 DIAGNOSIS — E119 Type 2 diabetes mellitus without complications: Secondary | ICD-10-CM | POA: Diagnosis not present

## 2015-08-26 DIAGNOSIS — E119 Type 2 diabetes mellitus without complications: Secondary | ICD-10-CM | POA: Diagnosis not present

## 2015-10-13 DIAGNOSIS — E7800 Pure hypercholesterolemia, unspecified: Secondary | ICD-10-CM | POA: Diagnosis not present

## 2015-10-13 DIAGNOSIS — I429 Cardiomyopathy, unspecified: Secondary | ICD-10-CM | POA: Diagnosis not present

## 2015-10-13 DIAGNOSIS — I509 Heart failure, unspecified: Secondary | ICD-10-CM | POA: Diagnosis not present

## 2015-10-13 DIAGNOSIS — K219 Gastro-esophageal reflux disease without esophagitis: Secondary | ICD-10-CM | POA: Diagnosis not present

## 2015-10-13 DIAGNOSIS — E119 Type 2 diabetes mellitus without complications: Secondary | ICD-10-CM | POA: Diagnosis not present

## 2015-10-13 DIAGNOSIS — I480 Paroxysmal atrial fibrillation: Secondary | ICD-10-CM | POA: Diagnosis not present

## 2015-10-13 DIAGNOSIS — I10 Essential (primary) hypertension: Secondary | ICD-10-CM | POA: Diagnosis not present

## 2015-10-20 DIAGNOSIS — I10 Essential (primary) hypertension: Secondary | ICD-10-CM | POA: Diagnosis not present

## 2015-10-20 DIAGNOSIS — I429 Cardiomyopathy, unspecified: Secondary | ICD-10-CM | POA: Diagnosis not present

## 2015-10-20 DIAGNOSIS — I5042 Chronic combined systolic (congestive) and diastolic (congestive) heart failure: Secondary | ICD-10-CM | POA: Diagnosis not present

## 2015-10-20 DIAGNOSIS — K219 Gastro-esophageal reflux disease without esophagitis: Secondary | ICD-10-CM | POA: Diagnosis not present

## 2015-10-20 DIAGNOSIS — N182 Chronic kidney disease, stage 2 (mild): Secondary | ICD-10-CM | POA: Diagnosis not present

## 2015-10-20 DIAGNOSIS — E119 Type 2 diabetes mellitus without complications: Secondary | ICD-10-CM | POA: Diagnosis not present

## 2015-10-20 DIAGNOSIS — I480 Paroxysmal atrial fibrillation: Secondary | ICD-10-CM | POA: Diagnosis not present

## 2015-10-20 DIAGNOSIS — I2510 Atherosclerotic heart disease of native coronary artery without angina pectoris: Secondary | ICD-10-CM | POA: Diagnosis not present

## 2015-10-20 DIAGNOSIS — E669 Obesity, unspecified: Secondary | ICD-10-CM | POA: Diagnosis not present

## 2015-10-29 DIAGNOSIS — E119 Type 2 diabetes mellitus without complications: Secondary | ICD-10-CM | POA: Diagnosis not present

## 2016-04-19 DIAGNOSIS — I10 Essential (primary) hypertension: Secondary | ICD-10-CM | POA: Diagnosis not present

## 2016-04-19 DIAGNOSIS — E7800 Pure hypercholesterolemia, unspecified: Secondary | ICD-10-CM | POA: Diagnosis not present

## 2016-04-19 DIAGNOSIS — E119 Type 2 diabetes mellitus without complications: Secondary | ICD-10-CM | POA: Diagnosis not present

## 2016-05-03 DIAGNOSIS — I480 Paroxysmal atrial fibrillation: Secondary | ICD-10-CM | POA: Diagnosis not present

## 2016-05-03 DIAGNOSIS — I10 Essential (primary) hypertension: Secondary | ICD-10-CM | POA: Diagnosis not present

## 2016-05-03 DIAGNOSIS — N182 Chronic kidney disease, stage 2 (mild): Secondary | ICD-10-CM | POA: Diagnosis not present

## 2016-05-03 DIAGNOSIS — I2510 Atherosclerotic heart disease of native coronary artery without angina pectoris: Secondary | ICD-10-CM | POA: Diagnosis not present

## 2016-05-03 DIAGNOSIS — I429 Cardiomyopathy, unspecified: Secondary | ICD-10-CM | POA: Diagnosis not present

## 2016-05-03 DIAGNOSIS — K219 Gastro-esophageal reflux disease without esophagitis: Secondary | ICD-10-CM | POA: Diagnosis not present

## 2016-05-03 DIAGNOSIS — E119 Type 2 diabetes mellitus without complications: Secondary | ICD-10-CM | POA: Diagnosis not present

## 2016-05-03 DIAGNOSIS — E669 Obesity, unspecified: Secondary | ICD-10-CM | POA: Diagnosis not present

## 2016-05-03 DIAGNOSIS — I5042 Chronic combined systolic (congestive) and diastolic (congestive) heart failure: Secondary | ICD-10-CM | POA: Diagnosis not present

## 2016-05-08 DIAGNOSIS — I10 Essential (primary) hypertension: Secondary | ICD-10-CM | POA: Diagnosis not present

## 2016-05-08 DIAGNOSIS — E7800 Pure hypercholesterolemia, unspecified: Secondary | ICD-10-CM | POA: Diagnosis not present

## 2016-05-08 DIAGNOSIS — E119 Type 2 diabetes mellitus without complications: Secondary | ICD-10-CM | POA: Diagnosis not present

## 2016-10-30 DIAGNOSIS — E1165 Type 2 diabetes mellitus with hyperglycemia: Secondary | ICD-10-CM | POA: Diagnosis not present

## 2016-10-30 DIAGNOSIS — K219 Gastro-esophageal reflux disease without esophagitis: Secondary | ICD-10-CM | POA: Diagnosis not present

## 2016-10-30 DIAGNOSIS — Z125 Encounter for screening for malignant neoplasm of prostate: Secondary | ICD-10-CM | POA: Diagnosis not present

## 2016-10-30 DIAGNOSIS — E7800 Pure hypercholesterolemia, unspecified: Secondary | ICD-10-CM | POA: Diagnosis not present

## 2016-10-30 DIAGNOSIS — Z716 Tobacco abuse counseling: Secondary | ICD-10-CM | POA: Diagnosis not present

## 2016-10-30 DIAGNOSIS — B351 Tinea unguium: Secondary | ICD-10-CM | POA: Diagnosis not present

## 2016-10-30 DIAGNOSIS — I10 Essential (primary) hypertension: Secondary | ICD-10-CM | POA: Diagnosis not present

## 2016-10-30 DIAGNOSIS — Z79899 Other long term (current) drug therapy: Secondary | ICD-10-CM | POA: Diagnosis not present

## 2016-11-13 DIAGNOSIS — M79674 Pain in right toe(s): Secondary | ICD-10-CM | POA: Diagnosis not present

## 2016-11-13 DIAGNOSIS — B351 Tinea unguium: Secondary | ICD-10-CM | POA: Diagnosis not present

## 2016-11-13 DIAGNOSIS — E119 Type 2 diabetes mellitus without complications: Secondary | ICD-10-CM | POA: Diagnosis not present

## 2016-11-13 DIAGNOSIS — M79675 Pain in left toe(s): Secondary | ICD-10-CM | POA: Diagnosis not present

## 2016-11-16 DIAGNOSIS — I480 Paroxysmal atrial fibrillation: Secondary | ICD-10-CM | POA: Diagnosis not present

## 2016-11-16 DIAGNOSIS — I2510 Atherosclerotic heart disease of native coronary artery without angina pectoris: Secondary | ICD-10-CM | POA: Diagnosis not present

## 2016-11-16 DIAGNOSIS — I5042 Chronic combined systolic (congestive) and diastolic (congestive) heart failure: Secondary | ICD-10-CM | POA: Diagnosis not present

## 2016-11-16 DIAGNOSIS — N182 Chronic kidney disease, stage 2 (mild): Secondary | ICD-10-CM | POA: Diagnosis not present

## 2016-11-16 DIAGNOSIS — E669 Obesity, unspecified: Secondary | ICD-10-CM | POA: Diagnosis not present

## 2016-11-16 DIAGNOSIS — I429 Cardiomyopathy, unspecified: Secondary | ICD-10-CM | POA: Diagnosis not present

## 2016-11-16 DIAGNOSIS — E119 Type 2 diabetes mellitus without complications: Secondary | ICD-10-CM | POA: Diagnosis not present

## 2016-11-16 DIAGNOSIS — K219 Gastro-esophageal reflux disease without esophagitis: Secondary | ICD-10-CM | POA: Diagnosis not present

## 2016-11-16 DIAGNOSIS — I10 Essential (primary) hypertension: Secondary | ICD-10-CM | POA: Diagnosis not present

## 2016-12-22 DIAGNOSIS — E119 Type 2 diabetes mellitus without complications: Secondary | ICD-10-CM | POA: Diagnosis not present

## 2017-01-30 DIAGNOSIS — E118 Type 2 diabetes mellitus with unspecified complications: Secondary | ICD-10-CM | POA: Diagnosis not present

## 2017-01-30 DIAGNOSIS — Z79899 Other long term (current) drug therapy: Secondary | ICD-10-CM | POA: Diagnosis not present

## 2017-01-30 DIAGNOSIS — Z23 Encounter for immunization: Secondary | ICD-10-CM | POA: Diagnosis not present

## 2017-01-30 DIAGNOSIS — Z0000 Encounter for general adult medical examination without abnormal findings: Secondary | ICD-10-CM | POA: Diagnosis not present

## 2017-01-30 DIAGNOSIS — N182 Chronic kidney disease, stage 2 (mild): Secondary | ICD-10-CM | POA: Diagnosis not present

## 2017-02-23 DIAGNOSIS — H2511 Age-related nuclear cataract, right eye: Secondary | ICD-10-CM | POA: Diagnosis not present

## 2017-03-06 ENCOUNTER — Encounter: Payer: Self-pay | Admitting: *Deleted

## 2017-03-08 ENCOUNTER — Encounter
Admission: RE | Disposition: A | Discharge status: Home / Self Care | Payer: Self-pay | Source: Ambulatory Visit | Attending: Ophthalmology

## 2017-03-08 ENCOUNTER — Ambulatory Visit: Payer: Medicare HMO | Admitting: Certified Registered Nurse Anesthetist

## 2017-03-08 ENCOUNTER — Encounter: Payer: Self-pay | Admitting: Emergency Medicine

## 2017-03-08 ENCOUNTER — Ambulatory Visit
Admission: RE | Admit: 2017-03-08 | Discharge: 2017-03-08 | Disposition: A | Discharge status: Home / Self Care | Payer: Medicare HMO | Source: Ambulatory Visit | Attending: Ophthalmology | Admitting: Ophthalmology

## 2017-03-08 DIAGNOSIS — Z7984 Long term (current) use of oral hypoglycemic drugs: Secondary | ICD-10-CM | POA: Insufficient documentation

## 2017-03-08 DIAGNOSIS — Z79899 Other long term (current) drug therapy: Secondary | ICD-10-CM | POA: Insufficient documentation

## 2017-03-08 DIAGNOSIS — Z7982 Long term (current) use of aspirin: Secondary | ICD-10-CM | POA: Insufficient documentation

## 2017-03-08 DIAGNOSIS — I110 Hypertensive heart disease with heart failure: Secondary | ICD-10-CM | POA: Diagnosis not present

## 2017-03-08 DIAGNOSIS — Z7901 Long term (current) use of anticoagulants: Secondary | ICD-10-CM | POA: Insufficient documentation

## 2017-03-08 DIAGNOSIS — Z720 Tobacco use: Secondary | ICD-10-CM | POA: Diagnosis not present

## 2017-03-08 DIAGNOSIS — Z9049 Acquired absence of other specified parts of digestive tract: Secondary | ICD-10-CM | POA: Diagnosis not present

## 2017-03-08 DIAGNOSIS — E119 Type 2 diabetes mellitus without complications: Secondary | ICD-10-CM | POA: Diagnosis not present

## 2017-03-08 DIAGNOSIS — I509 Heart failure, unspecified: Secondary | ICD-10-CM | POA: Diagnosis not present

## 2017-03-08 DIAGNOSIS — I4891 Unspecified atrial fibrillation: Secondary | ICD-10-CM | POA: Diagnosis not present

## 2017-03-08 DIAGNOSIS — H2511 Age-related nuclear cataract, right eye: Secondary | ICD-10-CM | POA: Insufficient documentation

## 2017-03-08 HISTORY — DX: Unspecified infectious disease: B99.9

## 2017-03-08 HISTORY — DX: Heart failure, unspecified: I50.9

## 2017-03-08 HISTORY — PX: CATARACT EXTRACTION W/PHACO: SHX586

## 2017-03-08 HISTORY — DX: Cardiac arrhythmia, unspecified: I49.9

## 2017-03-08 LAB — GLUCOSE, CAPILLARY: Glucose-Capillary: 99 mg/dL (ref 65–99)

## 2017-03-08 SURGERY — PHACOEMULSIFICATION, CATARACT, WITH IOL INSERTION
Anesthesia: Monitor Anesthesia Care | Site: Eye | Laterality: Right | Wound class: Clean

## 2017-03-08 MED ORDER — BSS IO SOLN
INTRAOCULAR | Status: DC | PRN
  Administered 2017-03-08: 1 via INTRAOCULAR

## 2017-03-08 MED ORDER — FENTANYL CITRATE (PF) 100 MCG/2ML IJ SOLN
INTRAMUSCULAR | Status: AC
  Filled 2017-03-08: qty 2

## 2017-03-08 MED ORDER — SODIUM HYALURONATE 23 MG/ML IO SOLN
INTRAOCULAR | Status: AC
  Filled 2017-03-08: qty 0.6

## 2017-03-08 MED ORDER — ARMC OPHTHALMIC DILATING DROPS
1.0000 "application " | OPHTHALMIC | Status: DC
  Administered 2017-03-08 (×3): 1 via OPHTHALMIC

## 2017-03-08 MED ORDER — LIDOCAINE HCL (PF) 4 % IJ SOLN
INTRAMUSCULAR | Status: AC
  Filled 2017-03-08: qty 5

## 2017-03-08 MED ORDER — SODIUM HYALURONATE 23 MG/ML IO SOLN
INTRAOCULAR | Status: DC | PRN
  Administered 2017-03-08: 0.6 mL via INTRAOCULAR

## 2017-03-08 MED ORDER — TRYPAN BLUE 0.06 % OP SOLN
OPHTHALMIC | Status: AC
  Filled 2017-03-08: qty 0.5

## 2017-03-08 MED ORDER — MOXIFLOXACIN HCL 0.5 % OP SOLN
OPHTHALMIC | Status: DC | PRN
  Administered 2017-03-08: 0.2 mL via OPHTHALMIC

## 2017-03-08 MED ORDER — FENTANYL CITRATE (PF) 100 MCG/2ML IJ SOLN
INTRAMUSCULAR | Status: DC | PRN
  Administered 2017-03-08 (×2): 50 ug via INTRAVENOUS

## 2017-03-08 MED ORDER — MOXIFLOXACIN HCL 0.5 % OP SOLN: 1.0000 [drp] | OPHTHALMIC | Status: DC | PRN

## 2017-03-08 MED ORDER — SODIUM CHLORIDE 0.9 % IV SOLN
INTRAVENOUS | Status: DC
  Administered 2017-03-08: 06:00:00 via INTRAVENOUS

## 2017-03-08 MED ORDER — SODIUM HYALURONATE 10 MG/ML IO SOLN
INTRAOCULAR | Status: DC | PRN
  Administered 2017-03-08: 0.55 mL via INTRAOCULAR

## 2017-03-08 MED ORDER — MOXIFLOXACIN HCL 0.5 % OP SOLN
OPHTHALMIC | Status: DC
Start: 2017-03-08 — End: 2017-03-08
  Filled 2017-03-08: qty 3

## 2017-03-08 MED ORDER — EPINEPHRINE PF 1 MG/ML IJ SOLN
INTRAMUSCULAR | Status: AC
  Filled 2017-03-08: qty 1

## 2017-03-08 MED ORDER — POVIDONE-IODINE 5 % OP SOLN
OPHTHALMIC | Status: DC | PRN
Start: 2017-03-08 — End: 2017-03-08
  Administered 2017-03-08: 1 via OPHTHALMIC

## 2017-03-08 MED ORDER — ARMC OPHTHALMIC DILATING DROPS
OPHTHALMIC | Status: AC
Start: 2017-03-08 — End: 2017-03-08
  Administered 2017-03-08: 1 via OPHTHALMIC
  Filled 2017-03-08: qty 0.4

## 2017-03-08 MED ORDER — LIDOCAINE HCL (PF) 4 % IJ SOLN
INTRAOCULAR | Status: DC | PRN
  Administered 2017-03-08: 4 mL via OPHTHALMIC

## 2017-03-08 MED ORDER — POVIDONE-IODINE 5 % OP SOLN
OPHTHALMIC | Status: AC
  Filled 2017-03-08: qty 30

## 2017-03-08 SURGICAL SUPPLY — 16 items
DISSECTOR HYDRO NUCLEUS 50X22 (MISCELLANEOUS) ×2 IMPLANT
GLOVE BIO SURGEON STRL SZ8 (GLOVE) ×2 IMPLANT
GLOVE BIOGEL M 6.5 STRL (GLOVE) ×2 IMPLANT
GLOVE SURG LX 7.5 STRW (GLOVE) ×1
GLOVE SURG LX STRL 7.5 STRW (GLOVE) ×1 IMPLANT
GOWN STRL REUS W/ TWL LRG LVL3 (GOWN DISPOSABLE) ×2 IMPLANT
GOWN STRL REUS W/TWL LRG LVL3 (GOWN DISPOSABLE) ×2
LABEL CATARACT MEDS ST (LABEL) ×2 IMPLANT
LENS IOL TECNIS ITEC 22.0 (Intraocular Lens) ×2 IMPLANT
PACK CATARACT (MISCELLANEOUS) ×2 IMPLANT
PACK CATARACT KING (MISCELLANEOUS) ×2 IMPLANT
PACK EYE AFTER SURG (MISCELLANEOUS) ×2 IMPLANT
SOL BSS BAG (MISCELLANEOUS) ×2
SOLUTION BSS BAG (MISCELLANEOUS) ×1 IMPLANT
WATER STERILE IRR 250ML POUR (IV SOLUTION) ×2 IMPLANT
WIPE NON LINTING 3.25X3.25 (MISCELLANEOUS) ×2 IMPLANT

## 2017-03-08 NOTE — Discharge Instructions (Signed)
Eye Surgery Discharge Instructions  Expect mild scratchy sensation or mild soreness. DO NOT RUB YOUR EYE!  The day of surgery:  Minimal physical activity, but bed rest is not required  No reading, computer work, or close hand work  No bending, lifting, or straining.  May watch TV  For 24 hours:  No driving, legal decisions, or alcoholic beverages  Safety precautions  Eat anything you prefer: It is better to start with liquids, then soup then solid foods.  _____ Eye patch should be worn until postoperative exam tomorrow.  ____ Solar shield eyeglasses should be worn for comfort in the sunlight/patch while sleeping  Resume all regular medications including aspirin or Coumadin if these were discontinued prior to surgery. You may shower, bathe, shave, or wash your hair. Tylenol may be taken for mild discomfort.  Call your doctor if you experience significant pain, nausea, or vomiting, fever > 101 or other signs of infection. [phone] or [phone] Specific instructions:  Follow-up Information    Nevada Crane, MD Follow up.   Specialty:  Ophthalmology Why:   November 16 at 9:30am Contact information: [address] [place] [address] [phone]

## 2017-03-08 NOTE — Anesthesia Preprocedure Evaluation (Signed)
Anesthesia Evaluation  Patient identified by MRN, date of birth, ID band Patient awake    Reviewed: Allergy & Precautions, NPO status , Patient's Chart, lab work & pertinent test results  History of Anesthesia Complications Negative for: history of anesthetic complications  Airway Mallampati: II  TM Distance: >3 FB Neck ROM: Full    Dental  (+) Poor Dentition   Pulmonary neg pulmonary ROS, neg sleep apnea, neg COPD,    breath sounds clear to auscultation- rhonchi (-) wheezing      Cardiovascular Exercise Tolerance: Good hypertension, Pt. on medications +CHF  (-) CAD, (-) Past MI and (-) Cardiac Stents + dysrhythmias Atrial Fibrillation  Rhythm:Regular Rate:Normal - Systolic murmurs and - Diastolic murmurs    Neuro/Psych negative neurological ROS  negative psych ROS   GI/Hepatic negative GI ROS, Neg liver ROS,   Endo/Other  diabetes, Oral Hypoglycemic Agents  Renal/GU negative Renal ROS     Musculoskeletal negative musculoskeletal ROS (+)   Abdominal (+) - obese,   Peds  Hematology negative hematology ROS (+)   Anesthesia Other Findings Past Medical History: No date: CHF (congestive heart failure) (HCC) No date: Diabetes (HCC) No date: Dysrhythmia     Comment:  AFIB No date: HTN (hypertension) No date: Infection     Comment:  LEG   Reproductive/Obstetrics                             Anesthesia Physical  Anesthesia Plan  ASA: III  Anesthesia Plan: MAC   Post-op Pain Management:    Induction: Intravenous  PONV Risk Score and Plan: 1 and Midazolam  Airway Management Planned: Natural Airway  Additional Equipment:   Intra-op Plan:   Post-operative Plan:   Informed Consent: I have reviewed the patients History and Physical, chart, labs and discussed the procedure including the risks, benefits and alternatives for the proposed anesthesia with the patient or authorized  representative who has indicated his/her understanding and acceptance.     Plan Discussed with: CRNA and Anesthesiologist  Anesthesia Plan Comments:         Anesthesia Quick Evaluation  

## 2017-03-08 NOTE — H&P (Signed)
The History and Physical notes are on paper, have been signed, and are to be scanned.   I have examined the patient and there are no changes to the H&P.   Willey Blade 03/08/2017 7:21 AM

## 2017-03-08 NOTE — Anesthesia Postprocedure Evaluation (Signed)
Anesthesia Post Note  Patient: [name] [name]  Procedure(s) Performed: CATARACT EXTRACTION PHACO AND INTRAOCULAR LENS PLACEMENT (IOC) (Right Eye)  Patient location during evaluation: PACU Anesthesia Type: MAC Level of consciousness: awake and alert and oriented Pain management: pain level controlled Vital Signs Assessment: post-procedure vital signs reviewed and stable Respiratory status: spontaneous breathing, nonlabored ventilation and respiratory function stable Cardiovascular status: blood pressure returned to baseline and stable Postop Assessment: no signs of nausea or vomiting Anesthetic complications: no     Last Vitals:  Vitals:   03/08/17 0812 03/08/17 0833  BP: (!) 134/92 (!) 126/93  Pulse: (!) 49   Resp: 16   Temp: 36.6 C   SpO2: 99% 100%    Last Pain:  Vitals:   03/08/17 0608  TempSrc: Tympanic                 Melodi Happel

## 2017-03-08 NOTE — Op Note (Signed)
OPERATIVE NOTE  [name] [name] [id number] 03/08/2017   PREOPERATIVE DIAGNOSIS:  Nuclear sclerotic cataract right eye.  H25.11   POSTOPERATIVE DIAGNOSIS:    Nuclear sclerotic cataract right eye.     PROCEDURE:  Phacoemusification with posterior chamber intraocular lens placement of the right eye   LENS:   Implant Name Type Inv. Item Serial No. Manufacturer Lot No. LRB No. Used  LENS IOL DIOP 22.0 - [id number] Intraocular Lens LENS IOL DIOP 22.0 [id number]  Right 1       PCB00 +22.0   ULTRASOUND TIME: 0 minutes 30 seconds.  CDE 2.54   SURGEON:  Willey Blade, MD, MPH  ANESTHESIOLOGIST: Anesthesiologist: Alver Fisher, MD CRNA: Malva Cogan, CRNA   ANESTHESIA:  Topical with tetracaine drops augmented with 1% preservative-free intracameral lidocaine.  ESTIMATED BLOOD LOSS: less than 1 mL.   COMPLICATIONS:  None.   DESCRIPTION OF PROCEDURE:  The patient was identified in the holding room and transported to the operating room and placed in the supine position under the operating microscope.  The right eye was identified as the operative eye and it was prepped and draped in the usual sterile ophthalmic fashion.   A 1.0 millimeter clear-corneal paracentesis was made at the 10:30 position. 0.5 ml of preservative-free 1% lidocaine with epinephrine was injected into the anterior chamber.  The anterior chamber was filled with Healon 5 viscoelastic.  A 2.4 millimeter keratome was used to make a near-clear corneal incision at the 8:00 position.  A curvilinear capsulorrhexis was made with a cystotome and capsulorrhexis forceps.  Balanced salt solution was used to hydrodissect and hydrodelineate the nucleus.   Phacoemulsification was then used in stop and chop fashion to remove the lens nucleus and epinucleus.  The remaining cortex was then removed using the irrigation and aspiration handpiece. Healon was then placed into the capsular bag to distend it for lens placement.  A lens was  then injected into the capsular bag.  The remaining viscoelastic was aspirated.   Wounds were hydrated with balanced salt solution.  The anterior chamber was inflated to a physiologic pressure with balanced salt solution.   Intracameral vigamox 0.1 mL undiluted was injected into the eye and a drop placed onto the ocular surface.  No wound leaks were noted.  The patient was taken to the recovery room in stable condition without complications of anesthesia or surgery  Willey Blade 03/08/2017, 8:11 AM

## 2017-03-08 NOTE — Anesthesia Post-op Follow-up Note (Signed)
Anesthesia QCDR form completed.        

## 2017-03-08 NOTE — Transfer of Care (Signed)
Immediate Anesthesia Transfer of Care Note  Patient: [name] [name]  Procedure(s) Performed: CATARACT EXTRACTION PHACO AND INTRAOCULAR LENS PLACEMENT (IOC) (Right Eye)  Patient Location: PACU  Anesthesia Type:MAC  Level of Consciousness: awake, alert  and oriented  Airway & Oxygen Therapy: Patient Spontanous Breathing  Post-op Assessment: Report given to RN and Post -op Vital signs reviewed and stable  Post vital signs: Reviewed and stable  Last Vitals:  Vitals:   03/08/17 0608  BP: (!) 136/92  Pulse: 82  Resp: 18  Temp: (!) 36.4 C  SpO2: 99%    Last Pain:  Vitals:   03/08/17 0608  TempSrc: Tympanic         Complications: No apparent anesthesia complications

## 2017-03-08 NOTE — Anesthesia Procedure Notes (Signed)
Procedure Name: MAC Performed by: Demetrius Charity, CRNA Pre-anesthesia Checklist: Patient identified, Suction available, Timeout performed, Patient being monitored and Emergency Drugs available Oxygen Delivery Method: Nasal cannula

## 2017-03-09 ENCOUNTER — Encounter: Payer: Self-pay | Admitting: Ophthalmology

## 2017-03-23 DIAGNOSIS — H2512 Age-related nuclear cataract, left eye: Secondary | ICD-10-CM | POA: Diagnosis not present

## 2017-04-03 ENCOUNTER — Encounter: Payer: Self-pay | Admitting: *Deleted

## 2017-04-04 ENCOUNTER — Ambulatory Visit
Admission: RE | Admit: 2017-04-04 | Discharge: 2017-04-04 | Disposition: A | Discharge status: Home / Self Care | Payer: Medicare HMO | Source: Ambulatory Visit | Attending: Ophthalmology | Admitting: Ophthalmology

## 2017-04-04 ENCOUNTER — Ambulatory Visit: Payer: Medicare HMO | Admitting: Anesthesiology

## 2017-04-04 ENCOUNTER — Other Ambulatory Visit: Payer: Self-pay

## 2017-04-04 ENCOUNTER — Encounter
Admission: RE | Disposition: A | Discharge status: Home / Self Care | Payer: Self-pay | Source: Ambulatory Visit | Attending: Ophthalmology

## 2017-04-04 ENCOUNTER — Encounter: Payer: Self-pay | Admitting: *Deleted

## 2017-04-04 DIAGNOSIS — I110 Hypertensive heart disease with heart failure: Secondary | ICD-10-CM | POA: Diagnosis not present

## 2017-04-04 DIAGNOSIS — Z7982 Long term (current) use of aspirin: Secondary | ICD-10-CM | POA: Insufficient documentation

## 2017-04-04 DIAGNOSIS — Z7901 Long term (current) use of anticoagulants: Secondary | ICD-10-CM | POA: Diagnosis not present

## 2017-04-04 DIAGNOSIS — Z7984 Long term (current) use of oral hypoglycemic drugs: Secondary | ICD-10-CM | POA: Diagnosis not present

## 2017-04-04 DIAGNOSIS — E119 Type 2 diabetes mellitus without complications: Secondary | ICD-10-CM | POA: Insufficient documentation

## 2017-04-04 DIAGNOSIS — I509 Heart failure, unspecified: Secondary | ICD-10-CM | POA: Insufficient documentation

## 2017-04-04 DIAGNOSIS — H2512 Age-related nuclear cataract, left eye: Secondary | ICD-10-CM | POA: Diagnosis not present

## 2017-04-04 DIAGNOSIS — Z79899 Other long term (current) drug therapy: Secondary | ICD-10-CM | POA: Diagnosis not present

## 2017-04-04 DIAGNOSIS — Z955 Presence of coronary angioplasty implant and graft: Secondary | ICD-10-CM | POA: Insufficient documentation

## 2017-04-04 DIAGNOSIS — I4891 Unspecified atrial fibrillation: Secondary | ICD-10-CM | POA: Insufficient documentation

## 2017-04-04 HISTORY — PX: CATARACT EXTRACTION W/PHACO: SHX586

## 2017-04-04 HISTORY — DX: Acute pancreatitis without necrosis or infection, unspecified: K85.90

## 2017-04-04 LAB — GLUCOSE, CAPILLARY: GLUCOSE-CAPILLARY: 115 mg/dL — AB (ref 65–99)

## 2017-04-04 SURGERY — PHACOEMULSIFICATION, CATARACT, WITH IOL INSERTION
Anesthesia: Monitor Anesthesia Care | Site: Eye | Laterality: Left | Wound class: Clean

## 2017-04-04 MED ORDER — MOXIFLOXACIN HCL 0.5 % OP SOLN
OPHTHALMIC | Status: AC
  Filled 2017-04-04: qty 3

## 2017-04-04 MED ORDER — MIDAZOLAM HCL 2 MG/2ML IJ SOLN
INTRAMUSCULAR | Status: AC
  Filled 2017-04-04: qty 2

## 2017-04-04 MED ORDER — EPINEPHRINE PF 1 MG/ML IJ SOLN
INTRAMUSCULAR | Status: AC
  Filled 2017-04-04: qty 1

## 2017-04-04 MED ORDER — LIDOCAINE HCL (PF) 2 % IJ SOLN
INTRAMUSCULAR | Status: AC
  Filled 2017-04-04: qty 10

## 2017-04-04 MED ORDER — LIDOCAINE HCL (PF) 4 % IJ SOLN
INTRAOCULAR | Status: DC | PRN
  Administered 2017-04-04: 4 mL via OPHTHALMIC

## 2017-04-04 MED ORDER — ARMC OPHTHALMIC DILATING DROPS
OPHTHALMIC | Status: AC
  Administered 2017-04-04: 06:00:00
  Filled 2017-04-04: qty 0.4

## 2017-04-04 MED ORDER — FENTANYL CITRATE (PF) 100 MCG/2ML IJ SOLN
INTRAMUSCULAR | Status: DC | PRN
  Administered 2017-04-04: 25 ug via INTRAVENOUS

## 2017-04-04 MED ORDER — FENTANYL CITRATE (PF) 100 MCG/2ML IJ SOLN
INTRAMUSCULAR | Status: AC
  Filled 2017-04-04: qty 2

## 2017-04-04 MED ORDER — TRYPAN BLUE 0.06 % OP SOLN
OPHTHALMIC | Status: AC
  Filled 2017-04-04: qty 0.5

## 2017-04-04 MED ORDER — POVIDONE-IODINE 5 % OP SOLN
OPHTHALMIC | Status: DC | PRN
  Administered 2017-04-04: 1 via OPHTHALMIC

## 2017-04-04 MED ORDER — CARBACHOL 0.01 % IO SOLN
INTRAOCULAR | Status: DC | PRN
  Administered 2017-04-04: 0.5 mL via INTRAOCULAR

## 2017-04-04 MED ORDER — MOXIFLOXACIN HCL 0.5 % OP SOLN
OPHTHALMIC | Status: DC | PRN
  Administered 2017-04-04: 0.2 mL via OPHTHALMIC

## 2017-04-04 MED ORDER — ARMC OPHTHALMIC DILATING DROPS
1.0000 "application " | Freq: Once | OPHTHALMIC | Status: AC
  Administered 2017-04-04: 1 via OPHTHALMIC

## 2017-04-04 MED ORDER — SODIUM HYALURONATE 23 MG/ML IO SOLN
INTRAOCULAR | Status: DC | PRN
  Administered 2017-04-04: 0.6 mL via INTRAOCULAR

## 2017-04-04 MED ORDER — MOXIFLOXACIN HCL 0.5 % OP SOLN: 1.0000 [drp] | OPHTHALMIC | Status: DC | PRN

## 2017-04-04 MED ORDER — MIDAZOLAM HCL 2 MG/2ML IJ SOLN
INTRAMUSCULAR | Status: DC | PRN
  Administered 2017-04-04: 0.5 mg via INTRAVENOUS

## 2017-04-04 MED ORDER — EPINEPHRINE PF 1 MG/ML IJ SOLN
INTRAMUSCULAR | Status: DC | PRN
  Administered 2017-04-04: 08:00:00 via OPHTHALMIC

## 2017-04-04 MED ORDER — SODIUM HYALURONATE 23 MG/ML IO SOLN
INTRAOCULAR | Status: AC
Start: 2017-04-04 — End: ?
  Filled 2017-04-04: qty 0.6

## 2017-04-04 MED ORDER — SODIUM HYALURONATE 10 MG/ML IO SOLN
INTRAOCULAR | Status: DC | PRN
  Administered 2017-04-04: 0.55 mL via INTRAOCULAR

## 2017-04-04 MED ORDER — SODIUM CHLORIDE 0.9 % IV SOLN
INTRAVENOUS | Status: DC
  Administered 2017-04-04 (×2): via INTRAVENOUS

## 2017-04-04 MED ORDER — PROPOFOL 10 MG/ML IV BOLUS
INTRAVENOUS | Status: AC
  Filled 2017-04-04: qty 20

## 2017-04-04 MED ORDER — POVIDONE-IODINE 5 % OP SOLN
OPHTHALMIC | Status: AC
  Filled 2017-04-04: qty 30

## 2017-04-04 MED ORDER — LIDOCAINE HCL (PF) 4 % IJ SOLN
INTRAMUSCULAR | Status: AC
Start: 2017-04-04 — End: ?
  Filled 2017-04-04: qty 5

## 2017-04-04 SURGICAL SUPPLY — 16 items
DISSECTOR HYDRO NUCLEUS 50X22 (MISCELLANEOUS) ×3 IMPLANT
GLOVE BIO SURGEON STRL SZ8 (GLOVE) ×3 IMPLANT
GLOVE BIOGEL M 6.5 STRL (GLOVE) ×3 IMPLANT
GLOVE SURG LX 7.5 STRW (GLOVE) ×2
GLOVE SURG LX STRL 7.5 STRW (GLOVE) ×1 IMPLANT
GOWN STRL REUS W/ TWL LRG LVL3 (GOWN DISPOSABLE) ×2 IMPLANT
GOWN STRL REUS W/TWL LRG LVL3 (GOWN DISPOSABLE) ×4
LABEL CATARACT MEDS ST (LABEL) ×3 IMPLANT
LENS IOL TECNIS ITEC 21.5 (Intraocular Lens) ×3 IMPLANT
PACK CATARACT (MISCELLANEOUS) ×3 IMPLANT
PACK CATARACT KING (MISCELLANEOUS) ×3 IMPLANT
PACK EYE AFTER SURG (MISCELLANEOUS) ×3 IMPLANT
SOL BSS BAG (MISCELLANEOUS) ×3
SOLUTION BSS BAG (MISCELLANEOUS) ×1 IMPLANT
WATER STERILE IRR 250ML POUR (IV SOLUTION) ×3 IMPLANT
WIPE NON LINTING 3.25X3.25 (MISCELLANEOUS) ×3 IMPLANT

## 2017-04-04 NOTE — Anesthesia Postprocedure Evaluation (Signed)
Anesthesia Post Note  Patient: [name] [name]  Procedure(s) Performed: CATARACT EXTRACTION PHACO AND INTRAOCULAR LENS PLACEMENT (IOC) (Left Eye)  Patient location during evaluation: PACU Anesthesia Type: MAC Level of consciousness: awake and alert Pain management: pain level controlled Vital Signs Assessment: post-procedure vital signs reviewed and stable Respiratory status: spontaneous breathing, nonlabored ventilation, respiratory function stable and patient connected to nasal cannula oxygen Cardiovascular status: stable and blood pressure returned to baseline Postop Assessment: no apparent nausea or vomiting Anesthetic complications: no     Last Vitals:  Vitals:   04/04/17 0822 04/04/17 0833  BP: (!) 147/96 (!) 151/93  Pulse: 78 62  Resp: 16 16  Temp:    SpO2: 100% 97%    Last Pain:  Vitals:   04/04/17 0819  TempSrc: Oral                 Precious Haws Piscitello

## 2017-04-04 NOTE — Anesthesia Preprocedure Evaluation (Signed)
Anesthesia Evaluation  Patient identified by MRN, date of birth, ID band Patient awake    Reviewed: Allergy & Precautions, NPO status , Patient's Chart, lab work & pertinent test results  History of Anesthesia Complications Negative for: history of anesthetic complications  Airway Mallampati: II  TM Distance: >3 FB Neck ROM: Full    Dental  (+) Poor Dentition   Pulmonary neg pulmonary ROS, neg sleep apnea, neg COPD,    breath sounds clear to auscultation- rhonchi (-) wheezing      Cardiovascular Exercise Tolerance: Good hypertension, Pt. on medications +CHF  (-) CAD, (-) Past MI and (-) Cardiac Stents + dysrhythmias Atrial Fibrillation  Rhythm:Regular Rate:Normal - Systolic murmurs and - Diastolic murmurs    Neuro/Psych negative neurological ROS  negative psych ROS   GI/Hepatic negative GI ROS, Neg liver ROS,   Endo/Other  diabetes, Oral Hypoglycemic Agents  Renal/GU negative Renal ROS     Musculoskeletal negative musculoskeletal ROS (+)   Abdominal (+) - obese,   Peds  Hematology negative hematology ROS (+)   Anesthesia Other Findings Past Medical History: No date: CHF (congestive heart failure) (HCC) No date: Diabetes (HCC) No date: Dysrhythmia     Comment:  AFIB No date: HTN (hypertension) No date: Infection     Comment:  LEG   Reproductive/Obstetrics                             Anesthesia Physical  Anesthesia Plan  ASA: III  Anesthesia Plan: MAC   Post-op Pain Management:    Induction: Intravenous  PONV Risk Score and Plan: 1 and Midazolam  Airway Management Planned: Natural Airway  Additional Equipment:   Intra-op Plan:   Post-operative Plan:   Informed Consent: I have reviewed the patients History and Physical, chart, labs and discussed the procedure including the risks, benefits and alternatives for the proposed anesthesia with the patient or authorized  representative who has indicated his/her understanding and acceptance.     Plan Discussed with: CRNA and Anesthesiologist  Anesthesia Plan Comments:         Anesthesia Quick Evaluation

## 2017-04-04 NOTE — H&P (Signed)
The History and Physical notes are on paper, have been signed, and are to be scanned.   I have examined the patient and there are no changes to the H&P.   Willey Blade 04/04/2017 7:21 AM

## 2017-04-04 NOTE — Anesthesia Post-op Follow-up Note (Signed)
Anesthesia QCDR form completed.        

## 2017-04-04 NOTE — Op Note (Signed)
OPERATIVE NOTE  Krithik Padmanabhan [id number] 04/04/2017   PREOPERATIVE DIAGNOSIS:  Nuclear sclerotic cataract left eye.  H25.12   POSTOPERATIVE DIAGNOSIS:    Nuclear sclerotic cataract left eye.     PROCEDURE:  Phacoemusification with posterior chamber intraocular lens placement of the left eye   LENS:   Implant Name Type Inv. Item Serial No. Manufacturer Lot No. LRB No. Used  LENS IOL DIOP 21.5 - [id number] Intraocular Lens LENS IOL DIOP 21.5 [id number] AMO  Left 1       PCB00 +21.5   ULTRASOUND TIME: 0 minutes 31 seconds.  CDE 3.78   SURGEON:  Willey Blade, MD, MPH   ANESTHESIA:  Topical with tetracaine drops augmented with 1% preservative-free intracameral lidocaine.  ESTIMATED BLOOD LOSS: <1 mL   COMPLICATIONS:  None.   DESCRIPTION OF PROCEDURE:  The patient was identified in the holding room and transported to the operating room and placed in the supine position under the operating microscope.  The left eye was identified as the operative eye and it was prepped and draped in the usual sterile ophthalmic fashion.   A 1.0 millimeter clear-corneal paracentesis was made at the 5:00 position. 0.5 ml of preservative-free 1% lidocaine with epinephrine was injected into the anterior chamber.  The anterior chamber was filled with Healon 5 viscoelastic.  A 2.4 millimeter keratome was used to make a near-clear corneal incision at the 2:00 position.  A curvilinear capsulorrhexis was made with a cystotome and capsulorrhexis forceps.  Balanced salt solution was used to hydrodissect and hydrodelineate the nucleus.   Phacoemulsification was then used in stop and chop fashion to remove the lens nucleus and epinucleus.  The remaining cortex was then removed using the irrigation and aspiration handpiece. Healon was then placed into the capsular bag to distend it for lens placement.  A lens was then injected into the capsular bag.  The remaining viscoelastic was aspirated.   Wounds were hydrated  with balanced salt solution.  The anterior chamber was inflated to a physiologic pressure with balanced salt solution.  A fragment was released from the paracentesis site, a new cassette had to be assembled since the other was contaminated.  The I/A was reassembled and the fragment easily removed.  Intracameral vigamox 0.1 mL undiltued was injected into the eye and a drop placed onto the ocular surface.  No wound leaks were noted.  The patient was taken to the recovery room in stable condition without complications of anesthesia or surgery  Willey Blade 04/04/2017, 8:18 AM

## 2017-04-04 NOTE — Transfer of Care (Signed)
Immediate Anesthesia Transfer of Care Note  Patient: [name] [name]  Procedure(s) Performed: CATARACT EXTRACTION PHACO AND INTRAOCULAR LENS PLACEMENT (IOC) (Left Eye)  Patient Location: PACU and Short Stay  Anesthesia Type:MAC  Level of Consciousness: awake and patient cooperative  Airway & Oxygen Therapy: Patient Spontanous Breathing  Post-op Assessment: Report given to RN and Post -op Vital signs reviewed and stable  Post vital signs: Reviewed and stable  Last Vitals:  Vitals:   04/04/17 0610 04/04/17 0822  BP: 140/88 (!) 147/96  Pulse: 72 78  Resp: 16 16  Temp: 36.6 C   SpO2: 93% 100%    Last Pain:  Vitals:   04/04/17 0610  TempSrc: Oral         Complications: No apparent anesthesia complications

## 2017-04-04 NOTE — Anesthesia Procedure Notes (Signed)
Performed by: Harshaan Whang, CRNA       

## 2017-04-04 NOTE — Discharge Instructions (Signed)
FOLLOW DR. Elmer Bales POSTOP EYE DROP SHEET AS REVIEWED.    Eye Surgery Discharge Instructions  Expect mild scratchy sensation or mild soreness. DO NOT RUB YOUR EYE!  The day of surgery:  Minimal physical activity, but bed rest is not required  No reading, computer work, or close hand work  No bending, lifting, or straining.  May watch TV  For 24 hours:  No driving, legal decisions, or alcoholic beverages  Safety precautions  Eat anything you prefer: It is better to start with liquids, then soup then solid foods.  _____ Eye patch should be worn until postoperative exam tomorrow.  ____ Solar shield eyeglasses should be worn for comfort in the sunlight/patch while sleeping  Resume all regular medications including aspirin or Coumadin if these were discontinued prior to surgery. You may shower, bathe, shave, or wash your hair. Tylenol may be taken for mild discomfort.  Call your doctor if you experience significant pain, nausea, or vomiting, fever > 101 or other signs of infection. [phone] or [phone] Specific instructions:  Follow-up Information    [place] Crane, MD Follow up.   Specialty:  Ophthalmology Why:  Thursday  04/05/17 @ 2:10 pm IN THE [facility] OFFICE Contact information: [address] [place] [address] [phone]

## 2017-04-05 ENCOUNTER — Encounter: Payer: Self-pay | Admitting: Ophthalmology

## 2017-05-18 DIAGNOSIS — Z961 Presence of intraocular lens: Secondary | ICD-10-CM | POA: Diagnosis not present

## 2017-05-23 DIAGNOSIS — I480 Paroxysmal atrial fibrillation: Secondary | ICD-10-CM | POA: Diagnosis not present

## 2017-05-23 DIAGNOSIS — I429 Cardiomyopathy, unspecified: Secondary | ICD-10-CM | POA: Diagnosis not present

## 2017-05-23 DIAGNOSIS — E669 Obesity, unspecified: Secondary | ICD-10-CM | POA: Diagnosis not present

## 2017-05-23 DIAGNOSIS — N182 Chronic kidney disease, stage 2 (mild): Secondary | ICD-10-CM | POA: Diagnosis not present

## 2017-05-23 DIAGNOSIS — E119 Type 2 diabetes mellitus without complications: Secondary | ICD-10-CM | POA: Diagnosis not present

## 2017-05-23 DIAGNOSIS — I5042 Chronic combined systolic (congestive) and diastolic (congestive) heart failure: Secondary | ICD-10-CM | POA: Diagnosis not present

## 2017-05-23 DIAGNOSIS — I10 Essential (primary) hypertension: Secondary | ICD-10-CM | POA: Diagnosis not present

## 2017-05-23 DIAGNOSIS — K219 Gastro-esophageal reflux disease without esophagitis: Secondary | ICD-10-CM | POA: Diagnosis not present

## 2017-05-23 DIAGNOSIS — I2510 Atherosclerotic heart disease of native coronary artery without angina pectoris: Secondary | ICD-10-CM | POA: Diagnosis not present

## 2017-08-08 DIAGNOSIS — Z79899 Other long term (current) drug therapy: Secondary | ICD-10-CM | POA: Diagnosis not present

## 2017-08-08 DIAGNOSIS — I10 Essential (primary) hypertension: Secondary | ICD-10-CM | POA: Diagnosis not present

## 2017-08-08 DIAGNOSIS — I4891 Unspecified atrial fibrillation: Secondary | ICD-10-CM | POA: Diagnosis not present

## 2017-08-08 DIAGNOSIS — I509 Heart failure, unspecified: Secondary | ICD-10-CM | POA: Diagnosis not present

## 2017-08-08 DIAGNOSIS — K219 Gastro-esophageal reflux disease without esophagitis: Secondary | ICD-10-CM | POA: Diagnosis not present

## 2017-08-08 DIAGNOSIS — E7800 Pure hypercholesterolemia, unspecified: Secondary | ICD-10-CM | POA: Diagnosis not present

## 2017-08-08 DIAGNOSIS — E119 Type 2 diabetes mellitus without complications: Secondary | ICD-10-CM | POA: Diagnosis not present

## 2017-08-09 DIAGNOSIS — E119 Type 2 diabetes mellitus without complications: Secondary | ICD-10-CM | POA: Diagnosis not present

## 2018-01-16 DIAGNOSIS — I429 Cardiomyopathy, unspecified: Secondary | ICD-10-CM | POA: Diagnosis not present

## 2018-01-16 DIAGNOSIS — N182 Chronic kidney disease, stage 2 (mild): Secondary | ICD-10-CM | POA: Diagnosis not present

## 2018-01-16 DIAGNOSIS — I10 Essential (primary) hypertension: Secondary | ICD-10-CM | POA: Diagnosis not present

## 2018-01-16 DIAGNOSIS — I480 Paroxysmal atrial fibrillation: Secondary | ICD-10-CM | POA: Diagnosis not present

## 2018-01-16 DIAGNOSIS — E669 Obesity, unspecified: Secondary | ICD-10-CM | POA: Diagnosis not present

## 2018-01-16 DIAGNOSIS — I2510 Atherosclerotic heart disease of native coronary artery without angina pectoris: Secondary | ICD-10-CM | POA: Diagnosis not present

## 2018-01-16 DIAGNOSIS — K219 Gastro-esophageal reflux disease without esophagitis: Secondary | ICD-10-CM | POA: Diagnosis not present

## 2018-01-16 DIAGNOSIS — E119 Type 2 diabetes mellitus without complications: Secondary | ICD-10-CM | POA: Diagnosis not present

## 2018-01-16 DIAGNOSIS — I5042 Chronic combined systolic (congestive) and diastolic (congestive) heart failure: Secondary | ICD-10-CM | POA: Diagnosis not present

## 2018-05-20 DIAGNOSIS — E538 Deficiency of other specified B group vitamins: Secondary | ICD-10-CM | POA: Insufficient documentation

## 2018-05-20 DIAGNOSIS — E559 Vitamin D deficiency, unspecified: Secondary | ICD-10-CM | POA: Insufficient documentation

## 2018-11-06 DIAGNOSIS — K219 Gastro-esophageal reflux disease without esophagitis: Secondary | ICD-10-CM | POA: Diagnosis not present

## 2018-11-06 DIAGNOSIS — J45909 Unspecified asthma, uncomplicated: Secondary | ICD-10-CM | POA: Diagnosis not present

## 2018-11-06 DIAGNOSIS — E7800 Pure hypercholesterolemia, unspecified: Secondary | ICD-10-CM | POA: Diagnosis not present

## 2018-11-06 DIAGNOSIS — E119 Type 2 diabetes mellitus without complications: Secondary | ICD-10-CM | POA: Diagnosis not present

## 2018-11-06 DIAGNOSIS — I4891 Unspecified atrial fibrillation: Secondary | ICD-10-CM | POA: Diagnosis not present

## 2018-11-06 DIAGNOSIS — E538 Deficiency of other specified B group vitamins: Secondary | ICD-10-CM | POA: Diagnosis not present

## 2018-11-06 DIAGNOSIS — Z1329 Encounter for screening for other suspected endocrine disorder: Secondary | ICD-10-CM | POA: Diagnosis not present

## 2018-11-06 DIAGNOSIS — Z125 Encounter for screening for malignant neoplasm of prostate: Secondary | ICD-10-CM | POA: Diagnosis not present

## 2018-11-06 DIAGNOSIS — I509 Heart failure, unspecified: Secondary | ICD-10-CM | POA: Diagnosis not present

## 2018-11-06 DIAGNOSIS — L230 Allergic contact dermatitis due to metals: Secondary | ICD-10-CM | POA: Diagnosis not present

## 2018-11-06 DIAGNOSIS — I10 Essential (primary) hypertension: Secondary | ICD-10-CM | POA: Diagnosis not present

## 2018-11-06 DIAGNOSIS — E559 Vitamin D deficiency, unspecified: Secondary | ICD-10-CM | POA: Diagnosis not present

## 2018-11-06 DIAGNOSIS — Z0000 Encounter for general adult medical examination without abnormal findings: Secondary | ICD-10-CM | POA: Diagnosis not present

## 2018-11-06 DIAGNOSIS — I429 Cardiomyopathy, unspecified: Secondary | ICD-10-CM | POA: Diagnosis not present

## 2019-05-07 DIAGNOSIS — I4891 Unspecified atrial fibrillation: Secondary | ICD-10-CM | POA: Diagnosis not present

## 2019-05-07 DIAGNOSIS — E559 Vitamin D deficiency, unspecified: Secondary | ICD-10-CM | POA: Diagnosis not present

## 2019-05-07 DIAGNOSIS — E119 Type 2 diabetes mellitus without complications: Secondary | ICD-10-CM | POA: Diagnosis not present

## 2019-05-07 DIAGNOSIS — Z7189 Other specified counseling: Secondary | ICD-10-CM | POA: Diagnosis not present

## 2019-05-07 DIAGNOSIS — Z0000 Encounter for general adult medical examination without abnormal findings: Secondary | ICD-10-CM | POA: Diagnosis not present

## 2019-05-07 DIAGNOSIS — Z79899 Other long term (current) drug therapy: Secondary | ICD-10-CM | POA: Diagnosis not present

## 2019-05-07 DIAGNOSIS — I10 Essential (primary) hypertension: Secondary | ICD-10-CM | POA: Diagnosis not present

## 2019-05-07 DIAGNOSIS — E538 Deficiency of other specified B group vitamins: Secondary | ICD-10-CM | POA: Diagnosis not present

## 2019-05-07 DIAGNOSIS — J45909 Unspecified asthma, uncomplicated: Secondary | ICD-10-CM | POA: Diagnosis not present

## 2019-09-16 DIAGNOSIS — E7800 Pure hypercholesterolemia, unspecified: Secondary | ICD-10-CM | POA: Diagnosis not present

## 2019-09-16 DIAGNOSIS — I4891 Unspecified atrial fibrillation: Secondary | ICD-10-CM | POA: Diagnosis not present

## 2019-09-16 DIAGNOSIS — I429 Cardiomyopathy, unspecified: Secondary | ICD-10-CM | POA: Diagnosis not present

## 2019-09-16 DIAGNOSIS — R001 Bradycardia, unspecified: Secondary | ICD-10-CM | POA: Diagnosis not present

## 2019-09-16 DIAGNOSIS — J45909 Unspecified asthma, uncomplicated: Secondary | ICD-10-CM | POA: Diagnosis not present

## 2019-09-16 DIAGNOSIS — I509 Heart failure, unspecified: Secondary | ICD-10-CM | POA: Diagnosis not present

## 2019-09-16 DIAGNOSIS — I10 Essential (primary) hypertension: Secondary | ICD-10-CM | POA: Diagnosis not present

## 2019-09-16 DIAGNOSIS — E119 Type 2 diabetes mellitus without complications: Secondary | ICD-10-CM | POA: Diagnosis not present

## 2019-09-16 DIAGNOSIS — K219 Gastro-esophageal reflux disease without esophagitis: Secondary | ICD-10-CM | POA: Diagnosis not present

## 2019-11-19 DIAGNOSIS — I429 Cardiomyopathy, unspecified: Secondary | ICD-10-CM | POA: Diagnosis not present

## 2019-11-19 DIAGNOSIS — E538 Deficiency of other specified B group vitamins: Secondary | ICD-10-CM | POA: Diagnosis not present

## 2019-11-19 DIAGNOSIS — Z1329 Encounter for screening for other suspected endocrine disorder: Secondary | ICD-10-CM | POA: Diagnosis not present

## 2019-11-19 DIAGNOSIS — E7800 Pure hypercholesterolemia, unspecified: Secondary | ICD-10-CM | POA: Diagnosis not present

## 2019-11-19 DIAGNOSIS — E119 Type 2 diabetes mellitus without complications: Secondary | ICD-10-CM | POA: Diagnosis not present

## 2019-11-19 DIAGNOSIS — R29898 Other symptoms and signs involving the musculoskeletal system: Secondary | ICD-10-CM | POA: Insufficient documentation

## 2019-11-19 DIAGNOSIS — J45909 Unspecified asthma, uncomplicated: Secondary | ICD-10-CM | POA: Diagnosis not present

## 2019-11-19 DIAGNOSIS — I509 Heart failure, unspecified: Secondary | ICD-10-CM | POA: Diagnosis not present

## 2019-11-19 DIAGNOSIS — Z0000 Encounter for general adult medical examination without abnormal findings: Secondary | ICD-10-CM | POA: Diagnosis not present

## 2019-11-19 DIAGNOSIS — I110 Hypertensive heart disease with heart failure: Secondary | ICD-10-CM | POA: Diagnosis not present

## 2019-11-19 DIAGNOSIS — I4891 Unspecified atrial fibrillation: Secondary | ICD-10-CM | POA: Diagnosis not present

## 2020-03-24 DIAGNOSIS — I428 Other cardiomyopathies: Secondary | ICD-10-CM | POA: Diagnosis not present

## 2020-03-24 DIAGNOSIS — I5022 Chronic systolic (congestive) heart failure: Secondary | ICD-10-CM | POA: Diagnosis not present

## 2020-03-24 DIAGNOSIS — R058 Other specified cough: Secondary | ICD-10-CM | POA: Diagnosis not present

## 2020-03-24 DIAGNOSIS — E7800 Pure hypercholesterolemia, unspecified: Secondary | ICD-10-CM | POA: Diagnosis not present

## 2020-03-24 DIAGNOSIS — R918 Other nonspecific abnormal finding of lung field: Secondary | ICD-10-CM | POA: Diagnosis not present

## 2020-03-24 DIAGNOSIS — R0602 Shortness of breath: Secondary | ICD-10-CM | POA: Diagnosis not present

## 2020-03-24 DIAGNOSIS — E1122 Type 2 diabetes mellitus with diabetic chronic kidney disease: Secondary | ICD-10-CM | POA: Diagnosis not present

## 2020-03-24 DIAGNOSIS — E119 Type 2 diabetes mellitus without complications: Secondary | ICD-10-CM | POA: Diagnosis not present

## 2020-03-24 DIAGNOSIS — R001 Bradycardia, unspecified: Secondary | ICD-10-CM | POA: Diagnosis not present

## 2020-03-24 DIAGNOSIS — I480 Paroxysmal atrial fibrillation: Secondary | ICD-10-CM | POA: Diagnosis not present

## 2020-03-24 DIAGNOSIS — I10 Essential (primary) hypertension: Secondary | ICD-10-CM | POA: Diagnosis not present

## 2020-05-05 DIAGNOSIS — R058 Other specified cough: Secondary | ICD-10-CM | POA: Diagnosis not present

## 2020-05-05 DIAGNOSIS — R0602 Shortness of breath: Secondary | ICD-10-CM | POA: Diagnosis not present

## 2020-05-21 DIAGNOSIS — I10 Essential (primary) hypertension: Secondary | ICD-10-CM | POA: Diagnosis not present

## 2020-05-21 DIAGNOSIS — I110 Hypertensive heart disease with heart failure: Secondary | ICD-10-CM | POA: Diagnosis not present

## 2020-05-21 DIAGNOSIS — E119 Type 2 diabetes mellitus without complications: Secondary | ICD-10-CM | POA: Diagnosis not present

## 2020-05-21 DIAGNOSIS — I509 Heart failure, unspecified: Secondary | ICD-10-CM | POA: Diagnosis not present

## 2020-05-21 DIAGNOSIS — J45909 Unspecified asthma, uncomplicated: Secondary | ICD-10-CM | POA: Diagnosis not present

## 2020-05-21 DIAGNOSIS — E538 Deficiency of other specified B group vitamins: Secondary | ICD-10-CM | POA: Diagnosis not present

## 2020-05-21 DIAGNOSIS — E559 Vitamin D deficiency, unspecified: Secondary | ICD-10-CM | POA: Diagnosis not present

## 2020-06-09 DIAGNOSIS — E7800 Pure hypercholesterolemia, unspecified: Secondary | ICD-10-CM | POA: Diagnosis not present

## 2020-06-09 DIAGNOSIS — R0602 Shortness of breath: Secondary | ICD-10-CM | POA: Diagnosis not present

## 2020-06-09 DIAGNOSIS — I480 Paroxysmal atrial fibrillation: Secondary | ICD-10-CM | POA: Diagnosis not present

## 2020-06-09 DIAGNOSIS — E119 Type 2 diabetes mellitus without complications: Secondary | ICD-10-CM | POA: Diagnosis not present

## 2020-06-09 DIAGNOSIS — I5022 Chronic systolic (congestive) heart failure: Secondary | ICD-10-CM | POA: Diagnosis not present

## 2020-06-09 DIAGNOSIS — I428 Other cardiomyopathies: Secondary | ICD-10-CM | POA: Diagnosis not present

## 2020-06-09 DIAGNOSIS — N189 Chronic kidney disease, unspecified: Secondary | ICD-10-CM | POA: Diagnosis not present

## 2020-06-09 DIAGNOSIS — I10 Essential (primary) hypertension: Secondary | ICD-10-CM | POA: Diagnosis not present

## 2020-06-09 DIAGNOSIS — R001 Bradycardia, unspecified: Secondary | ICD-10-CM | POA: Diagnosis not present

## 2020-11-19 DIAGNOSIS — E538 Deficiency of other specified B group vitamins: Secondary | ICD-10-CM | POA: Diagnosis not present

## 2020-11-19 DIAGNOSIS — J45909 Unspecified asthma, uncomplicated: Secondary | ICD-10-CM | POA: Diagnosis not present

## 2020-11-19 DIAGNOSIS — Z0000 Encounter for general adult medical examination without abnormal findings: Secondary | ICD-10-CM | POA: Diagnosis not present

## 2020-11-19 DIAGNOSIS — K219 Gastro-esophageal reflux disease without esophagitis: Secondary | ICD-10-CM | POA: Diagnosis not present

## 2020-11-19 DIAGNOSIS — I509 Heart failure, unspecified: Secondary | ICD-10-CM | POA: Diagnosis not present

## 2020-11-19 DIAGNOSIS — E119 Type 2 diabetes mellitus without complications: Secondary | ICD-10-CM | POA: Diagnosis not present

## 2020-11-19 DIAGNOSIS — I429 Cardiomyopathy, unspecified: Secondary | ICD-10-CM | POA: Diagnosis not present

## 2020-11-19 DIAGNOSIS — E559 Vitamin D deficiency, unspecified: Secondary | ICD-10-CM | POA: Diagnosis not present

## 2020-11-19 DIAGNOSIS — I4891 Unspecified atrial fibrillation: Secondary | ICD-10-CM | POA: Diagnosis not present

## 2020-11-19 DIAGNOSIS — E7800 Pure hypercholesterolemia, unspecified: Secondary | ICD-10-CM | POA: Diagnosis not present

## 2020-11-19 DIAGNOSIS — R29898 Other symptoms and signs involving the musculoskeletal system: Secondary | ICD-10-CM | POA: Diagnosis not present

## 2020-11-19 DIAGNOSIS — I110 Hypertensive heart disease with heart failure: Secondary | ICD-10-CM | POA: Diagnosis not present

## 2020-11-19 DIAGNOSIS — I10 Essential (primary) hypertension: Secondary | ICD-10-CM | POA: Diagnosis not present

## 2020-11-24 DIAGNOSIS — E119 Type 2 diabetes mellitus without complications: Secondary | ICD-10-CM | POA: Diagnosis not present

## 2020-11-24 DIAGNOSIS — E7800 Pure hypercholesterolemia, unspecified: Secondary | ICD-10-CM | POA: Diagnosis not present

## 2020-11-24 DIAGNOSIS — N189 Chronic kidney disease, unspecified: Secondary | ICD-10-CM | POA: Diagnosis not present

## 2020-11-24 DIAGNOSIS — I10 Essential (primary) hypertension: Secondary | ICD-10-CM | POA: Diagnosis not present

## 2020-11-24 DIAGNOSIS — R001 Bradycardia, unspecified: Secondary | ICD-10-CM | POA: Diagnosis not present

## 2020-11-24 DIAGNOSIS — R0602 Shortness of breath: Secondary | ICD-10-CM | POA: Diagnosis not present

## 2020-11-24 DIAGNOSIS — I480 Paroxysmal atrial fibrillation: Secondary | ICD-10-CM | POA: Diagnosis not present

## 2020-11-24 DIAGNOSIS — I5022 Chronic systolic (congestive) heart failure: Secondary | ICD-10-CM | POA: Diagnosis not present

## 2020-11-24 DIAGNOSIS — I428 Other cardiomyopathies: Secondary | ICD-10-CM | POA: Diagnosis not present

## 2020-12-20 ENCOUNTER — Other Ambulatory Visit: Payer: Self-pay

## 2020-12-20 ENCOUNTER — Emergency Department
Admission: EM | Admit: 2020-12-20 | Discharge: 2020-12-20 | Disposition: A | Discharge status: Home / Self Care | Payer: Medicare HMO | Attending: Emergency Medicine | Admitting: Emergency Medicine

## 2020-12-20 ENCOUNTER — Emergency Department: Payer: Medicare HMO

## 2020-12-20 DIAGNOSIS — L089 Local infection of the skin and subcutaneous tissue, unspecified: Secondary | ICD-10-CM | POA: Diagnosis present

## 2020-12-20 DIAGNOSIS — Z7901 Long term (current) use of anticoagulants: Secondary | ICD-10-CM | POA: Insufficient documentation

## 2020-12-20 DIAGNOSIS — F17220 Nicotine dependence, chewing tobacco, uncomplicated: Secondary | ICD-10-CM | POA: Insufficient documentation

## 2020-12-20 DIAGNOSIS — Z7982 Long term (current) use of aspirin: Secondary | ICD-10-CM | POA: Insufficient documentation

## 2020-12-20 DIAGNOSIS — D72829 Elevated white blood cell count, unspecified: Secondary | ICD-10-CM | POA: Diagnosis not present

## 2020-12-20 DIAGNOSIS — Z79899 Other long term (current) drug therapy: Secondary | ICD-10-CM | POA: Diagnosis not present

## 2020-12-20 DIAGNOSIS — I110 Hypertensive heart disease with heart failure: Secondary | ICD-10-CM | POA: Diagnosis not present

## 2020-12-20 DIAGNOSIS — Z7984 Long term (current) use of oral hypoglycemic drugs: Secondary | ICD-10-CM | POA: Insufficient documentation

## 2020-12-20 DIAGNOSIS — L03115 Cellulitis of right lower limb: Secondary | ICD-10-CM | POA: Diagnosis not present

## 2020-12-20 DIAGNOSIS — S91301A Unspecified open wound, right foot, initial encounter: Secondary | ICD-10-CM | POA: Diagnosis not present

## 2020-12-20 DIAGNOSIS — I509 Heart failure, unspecified: Secondary | ICD-10-CM | POA: Diagnosis not present

## 2020-12-20 DIAGNOSIS — E119 Type 2 diabetes mellitus without complications: Secondary | ICD-10-CM | POA: Diagnosis not present

## 2020-12-20 DIAGNOSIS — M7731 Calcaneal spur, right foot: Secondary | ICD-10-CM | POA: Diagnosis not present

## 2020-12-20 LAB — CBC
HCT: 38.2 % — ABNORMAL LOW (ref 39.0–52.0)
Hemoglobin: 13.4 g/dL (ref 13.0–17.0)
MCH: 29.3 pg (ref 26.0–34.0)
MCHC: 35.1 g/dL (ref 30.0–36.0)
MCV: 83.6 fL (ref 80.0–100.0)
Platelets: 200 10*3/uL (ref 150–400)
RBC: 4.57 MIL/uL (ref 4.22–5.81)
RDW: 14.3 % (ref 11.5–15.5)
WBC: 12.9 10*3/uL — ABNORMAL HIGH (ref 4.0–10.5)
nRBC: 0 % (ref 0.0–0.2)

## 2020-12-20 LAB — BASIC METABOLIC PANEL
Anion gap: 8 (ref 5–15)
BUN: 12 mg/dL (ref 8–23)
CO2: 24 mmol/L (ref 22–32)
Calcium: 9.1 mg/dL (ref 8.9–10.3)
Chloride: 103 mmol/L (ref 98–111)
Creatinine, Ser: 1.44 mg/dL — ABNORMAL HIGH (ref 0.61–1.24)
GFR, Estimated: 49 mL/min — ABNORMAL LOW (ref >60)
Glucose, Bld: 222 mg/dL — ABNORMAL HIGH (ref 70–99)
Potassium: 3.7 mmol/L (ref 3.5–5.1)
Sodium: 135 mmol/L (ref 135–145)

## 2020-12-20 MED ORDER — CEPHALEXIN 500 MG PO CAPS: 500.0000 mg | ORAL_CAPSULE | Freq: Four times a day (QID) | ORAL | 0 refills | Status: AC

## 2020-12-20 NOTE — Discharge Instructions (Addendum)
Please seek medical attention for any high fevers, chest pain, shortness of breath, change in behavior, persistent vomiting, bloody stool or any other new or concerning symptoms.  

## 2020-12-20 NOTE — ED Notes (Signed)
This RN cleaned and dressed pt rt ankle wound.

## 2020-12-20 NOTE — ED Provider Notes (Signed)
[facility] Emergency Department Provider Note  ____________________________________________   I have reviewed the triage vital signs and the nursing notes.   HISTORY  Chief Complaint Wound Infection   History limited by: Not Limited   HPI [name] [name] is a 80 y.o. male who presents to the emergency department today because of concern for a wound to his right ankle. The patient states that he had surgery on that leg in 2006 and has not had issues with it since then. The patient does state that a few weeks ago he noticed that the wound started to open up towards the bottom of the incision. Had been putting some dressing on it. Did have some pain to that area. Comes in today because the pain was worse. Has noticed some bleeding from the wound. The patient denies any fevers, nausea or vomiting.   Records reviewed. Per medical record review patient has a history of CHF, DM.   Past Medical History:  Diagnosis Date   CHF (congestive heart failure) (HCC)    Diabetes (HCC)    Dysrhythmia    AFIB   HTN (hypertension)    Infection    LEG   Pancreatitis     There are no problems to display for this patient.   Past Surgical History:  Procedure Laterality Date   CATARACT EXTRACTION W/PHACO Right 03/08/2017   Procedure: CATARACT EXTRACTION PHACO AND INTRAOCULAR LENS PLACEMENT (IOC);  Surgeon: [place] Crane, MD;  Location: [facility] ORS;  Service: Ophthalmology;  Laterality: Right;  Lot# [id number] H [place]: 00:30.9 AP%: 8.2 CDE: 2.54   CATARACT EXTRACTION W/PHACO Left 04/04/2017   Procedure: CATARACT EXTRACTION PHACO AND INTRAOCULAR LENS PLACEMENT (IOC);  Surgeon: [place] Crane, MD;  Location: [facility] ORS;  Service: Ophthalmology;  Laterality: Left;  [place] 00:31.4 AP% 12.0 CDE 3.78 Fluid Pack lot # [id number]   CHOLECYSTECTOMY     EYE SURGERY     right eye    Prior to Admission medications   Medication Sig Start Date End Date Taking? Authorizing Provider  aspirin  EC 81 MG tablet Take 81 mg daily by mouth.    [provider]  carvedilol (COREG) 6.25 MG tablet Take 6.25 mg 2 (two) times daily by mouth. 12/19/16   [provider]  digoxin (LANOXIN) 0.125 MG tablet Take 0.125 mcg daily by mouth. 12/19/16   [provider]  erythromycin ophthalmic ointment Place a 1/2 inch ribbon of ointment into the lower eyelid 5 times per day Patient not taking: Reported on 03/29/2017 03/27/15   Joselyn Arrow, NP  furosemide (LASIX) 20 MG tablet Take 20 mg daily by mouth. 12/19/16   [provider]  Hydrocortisone Probutate 0.1 % CREA Apply 1 application topically 3 (three) times daily as needed (for itching).  11/23/14   [provider]  lisinopril (PRINIVIL,ZESTRIL) 10 MG tablet Take 10 mg by mouth daily.    [provider]  metFORMIN (GLUCOPHAGE) 1000 MG tablet Take 500 mg by mouth 2 (two) times daily with a meal.     [provider]  XARELTO 20 MG TABS tablet Take 20 mg daily by mouth. 12/19/16   [provider]    Allergies Patient has no known allergies.  No family history on file.  Social History Social History   Tobacco Use   Smoking status: Never   Smokeless tobacco: Current    Types: Chew  Substance Use Topics   Alcohol use: No    Alcohol/week: 0.0 standard drinks  Drug use: No    Review of Systems Constitutional: No fever/chills Eyes: No visual changes. ENT: No sore throat. Cardiovascular: Denies chest pain. Respiratory: Denies shortness of breath. Gastrointestinal: No abdominal pain.  No nausea, no vomiting.  No diarrhea.   Genitourinary: Negative for dysuria. Musculoskeletal: Negative for back pain. Skin: Positive for right leg wound. Neurological: Negative for headaches, focal weakness or numbness.  ____________________________________________   PHYSICAL EXAM:  VITAL SIGNS: ED Triage Vitals [12/20/20 1416]  Enc Vitals Group     BP 127/81     Pulse Rate 74      Resp 18     Temp 98.1 F (36.7 C)     Temp Source Oral     SpO2 96 %     Weight 197 lb (89.4 kg)     Height 6' (1.829 m)     Head Circumference      Peak Flow      Pain Score 8   Constitutional: Alert and oriented.  Eyes: Conjunctivae are normal.  ENT      Head: Normocephalic and atraumatic.      Nose: No congestion/rhinnorhea.      Mouth/Throat: Mucous membranes are moist.      Neck: No stridor. Hematological/Lymphatic/Immunilogical: No cervical lymphadenopathy. Cardiovascular: Normal rate, regular rhythm.  No murmurs, rubs, or gallops.  Respiratory: Normal respiratory effort without tachypnea nor retractions. Breath sounds are clear and equal bilaterally. No wheezes/rales/rhonchi. Gastrointestinal: Soft and non tender. No rebound. No guarding.  Genitourinary: Deferred Musculoskeletal: Normal range of motion in all extremities. No lower extremity edema. Neurologic:  Normal speech and language. No gross focal neurologic deficits are appreciated.  Skin:  Wound to right ankle at distal part of the patients old surgical incision scar. No drainage at this time. No surrounding erythema. Mildly tender to palpation.  Psychiatric: Mood and affect are normal. Speech and behavior are normal. Patient exhibits appropriate insight and judgment.  ____________________________________________    LABS (pertinent positives/negatives)  CBC wbc 12.9, hgb 13.4, plt 200 BMP na 135, k 3.7, glu 222, cr 1.44  ____________________________________________   EKG  None  ____________________________________________    RADIOLOGY  Right foot No findings concerning for osteomyelitis  ____________________________________________   PROCEDURES  Procedures  ____________________________________________   INITIAL IMPRESSION / ASSESSMENT AND PLAN / ED COURSE  Pertinent labs & imaging results that were available during my care of the patient were reviewed by me and considered in my medical  decision making (see chart for details).   Patient presented to the emergency department today because of concerns for wound to old incision site in his right lower leg.  On exam he does have a small wound there.  I do not appreciate any surrounding erythema or warmth.  It is mildly tender.  No discharge at this time.  Blood work revealed a slight leukocytosis however patient is afebrile here.  Will plan on starting patient on antibiotics for possible cellulitis.  X-ray was obtained which is not consistent with osteomyelitis at this time.  Will give patient wound care center follow-up.   ____________________________________________   FINAL CLINICAL IMPRESSION(S) / ED DIAGNOSES  Final diagnoses:  Cellulitis of right foot  Wound infection     Note: This dictation was prepared with Dragon dictation. Any transcriptional errors that result from this process are unintentional     Phineas Semen, MD 12/20/20 1624

## 2020-12-20 NOTE — ED Triage Notes (Signed)
Pt here with a wound to his right foot that has gotten worse over the past few weeks. Pt states that he had an operation on his foor in 2006 nbut has had issues ever since. Wound is draining and red tinged. Pt also c/o pain to the foot.

## 2021-01-03 ENCOUNTER — Other Ambulatory Visit: Payer: Self-pay

## 2021-01-03 ENCOUNTER — Encounter: Payer: Medicare HMO | Attending: Physician Assistant | Admitting: Physician Assistant

## 2021-01-03 ENCOUNTER — Other Ambulatory Visit
Admission: RE | Admit: 2021-01-03 | Discharge: 2021-01-03 | Disposition: A | Discharge status: Home / Self Care | Payer: Medicare HMO | Source: Ambulatory Visit | Attending: Physician Assistant | Admitting: Physician Assistant

## 2021-01-03 DIAGNOSIS — E1151 Type 2 diabetes mellitus with diabetic peripheral angiopathy without gangrene: Secondary | ICD-10-CM | POA: Insufficient documentation

## 2021-01-03 DIAGNOSIS — L97319 Non-pressure chronic ulcer of right ankle with unspecified severity: Secondary | ICD-10-CM | POA: Diagnosis not present

## 2021-01-03 DIAGNOSIS — E11622 Type 2 diabetes mellitus with other skin ulcer: Secondary | ICD-10-CM | POA: Diagnosis not present

## 2021-01-03 DIAGNOSIS — A499 Bacterial infection, unspecified: Secondary | ICD-10-CM | POA: Diagnosis not present

## 2021-01-03 DIAGNOSIS — L97312 Non-pressure chronic ulcer of right ankle with fat layer exposed: Secondary | ICD-10-CM | POA: Insufficient documentation

## 2021-01-03 DIAGNOSIS — I110 Hypertensive heart disease with heart failure: Secondary | ICD-10-CM | POA: Diagnosis not present

## 2021-01-03 DIAGNOSIS — I5042 Chronic combined systolic (congestive) and diastolic (congestive) heart failure: Secondary | ICD-10-CM | POA: Diagnosis not present

## 2021-01-03 DIAGNOSIS — L089 Local infection of the skin and subcutaneous tissue, unspecified: Secondary | ICD-10-CM | POA: Diagnosis not present

## 2021-01-03 DIAGNOSIS — I480 Paroxysmal atrial fibrillation: Secondary | ICD-10-CM | POA: Diagnosis not present

## 2021-01-03 NOTE — Progress Notes (Signed)
[name] [name], [name] [name] ([id number]) Visit Report for 01/03/2021 Abuse/Suicide Risk Screen Details Patient Name: [name] [name], [name] [name] Date of Service: 01/03/2021 9:45 AM Medical Record Number: [id number] Patient Account Number: [id number] Date of Birth/Sex: 28-Jun-1940 (80 y.o. M) Treating RN: Hansel Feinstein Primary Care Zaidy Absher: Cheryll Dessert Other Clinician: Referring Susann Lawhorne: Phineas Semen Treating Gilbert Narain/Extender: Rowan Blase in Treatment: 0 Abuse/Suicide Risk Screen Items Answer ABUSE RISK SCREEN: Has anyone close to you tried to hurt or harm you recentlyo No Do you feel uncomfortable with anyone in your familyo No Has anyone forced you do things that you didnot want to doo No Electronic Signature(s) Signed: 01/03/2021 11:54:59 AM By: Hansel Feinstein Entered By: Hansel Feinstein on 01/03/2021 09:51:51 [name] [name] ([id number]) -------------------------------------------------------------------------------- Activities of Daily Living Details Patient Name: [name] [name] Date of Service: 01/03/2021 9:45 AM Medical Record Number: [id number] Patient Account Number: [id number] Date of Birth/Sex: [birth year] (80 y.o. M) Treating RN: Hansel Feinstein Primary Care Eithan Beagle: Cheryll Dessert Other Clinician: Referring Gregg Winchell: Phineas Semen Treating Maimuna Leaman/Extender: Rowan Blase in Treatment: 0 Activities of Daily Living Items Answer Activities of Daily Living (Please select one for each item) Drive Automobile Completely Able Take Medications Completely Able Use Telephone Completely Able Care for Appearance Completely Able Use Toilet Completely Able Bath / Shower Completely Able Dress Self Completely Able Feed Self Completely Able Walk Completely Able Get In / Out Bed Completely Able Housework Completely Able Prepare Meals Completely Able Handle Money Completely Able Shop for Self Completely Able Electronic Signature(s) Signed: 01/03/2021 11:54:59 AM By: Hansel Feinstein Entered By: Hansel Feinstein on 01/03/2021 09:52:11 [name] [name] ([id number]) -------------------------------------------------------------------------------- Education Screening Details Patient Name: [name] [name] Date of Service: 01/03/2021 9:45 AM Medical Record Number: [id number] Patient Account Number: [id number] Date of Birth/Sex: 25-Jan-1941 (80 y.o. M) Treating RN: Hansel Feinstein Primary Care Deysha Cartier: Cheryll Dessert Other Clinician: Referring Kentrel Clevenger: Phineas Semen Treating Anahi Belmar/Extender: Rowan Blase in Treatment: 0 Primary Learner Assessed: Patient Learning Preferences/Education Level/Primary Language Learning Preference: Explanation Highest Education Level: High School Preferred Language: English Cognitive Barrier Language Barrier: No Translator Needed: No Memory Deficit: No Emotional Barrier: No Cultural/Religious Beliefs Affecting Medical Care: No Physical Barrier Impaired Vision: No Impaired Hearing: No Decreased Hand dexterity: No Knowledge/Comprehension Knowledge Level: Medium Comprehension Level: Medium Ability to understand written instructions: High Ability to understand verbal instructions: High Motivation Anxiety Level: Calm Cooperation: Cooperative Education Importance: Acknowledges Need Interest in Health Problems: Asks Questions Perception: Coherent Willingness to Engage in Self-Management High Activities: Readiness to Engage in Self-Management High Activities: Electronic Signature(s) Signed: 01/03/2021 11:54:59 AM By: Hansel Feinstein Entered ByHansel Feinstein on 01/03/2021 09:53:55 [name] [name] ([id number]) -------------------------------------------------------------------------------- Fall Risk Assessment Details Patient Name: [name] [name] Date of Service: 01/03/2021 9:45 AM Medical Record Number: [id number] Patient Account Number: [id number] Date of Birth/Sex: 1940/11/29 (80 y.o. M) Treating RN: Hansel Feinstein Primary Care Rendy Lazard: Cheryll Dessert Other  Clinician: Referring Sheela Mcculley: Phineas Semen Treating Foster Sonnier/Extender: Rowan Blase in Treatment: 0 Fall Risk Assessment Items Have you had 2 or more falls in the last 12 monthso 0 No Have you had any fall that resulted in injury in the last 12 monthso 0 No FALLS RISK SCREEN History of falling - immediate or within 3 months 0 No Secondary diagnosis (Do you have 2 or more medical diagnoseso) 0 No Ambulatory aid None/bed rest/wheelchair/nurse 0 Yes Crutches/cane/walker 0 No Furniture 0 No Intravenous therapy Access/Saline/Heparin Lock 0 No Gait/Transferring Normal/ bed rest/ wheelchair 0 Yes Weak (short steps with or without shuffle, stooped but able to lift head while walking, may  0 No seek support from furniture) Impaired (short steps with shuffle, may have difficulty arising from chair, head down, impaired 0 No balance) Mental Status Oriented to own ability 0 Yes Electronic Signature(s) Signed: 01/03/2021 11:54:59 AM By: Hansel Feinstein Entered By: Hansel Feinstein on 01/03/2021 09:54:13 [name] [name] ([id number]) -------------------------------------------------------------------------------- Foot Assessment Details Patient Name: [name] [name] Date of Service: 01/03/2021 9:45 AM Medical Record Number: [id number] Patient Account Number: [id number] Date of Birth/Sex: 12-20-1940 (80 y.o. M) Treating RN: Hansel Feinstein Primary Care Elizabeth Paulsen: Cheryll Dessert Other Clinician: Referring Zadiel Leyh: Phineas Semen Treating Brieanne Mignone/Extender: Rowan Blase in Treatment: 0 Foot Assessment Items Site Locations + = Sensation present, - = Sensation absent, C = Callus, U = Ulcer R = Redness, W = Warmth, M = Maceration, PU = Pre-ulcerative lesion F = Fissure, S = Swelling, D = Dryness Assessment Right: Left: Other Deformity: No No Prior Foot Ulcer: No No Prior Amputation: No No Charcot Joint: No No Ambulatory Status: Ambulatory Without Help Gait: Steady Electronic  Signature(s) Signed: 01/03/2021 11:54:59 AM By: Hansel Feinstein Entered ByHansel Feinstein on 01/03/2021 09:53:14 [name] [name] ([id number]) -------------------------------------------------------------------------------- Nutrition Risk Screening Details Patient Name: [name] [name] Date of Service: 01/03/2021 9:45 AM Medical Record Number: [id number] Patient Account Number: [id number] Date of Birth/Sex: 04/29/1940 (80 y.o. M) Treating RN: Hansel Feinstein Primary Care Khiyan Crace: Cheryll Dessert Other Clinician: Referring Daviyon Widmayer: Phineas Semen Treating Bexleigh Theriault/Extender: Rowan Blase in Treatment: 0 Height (in): 72 Weight (lbs): 178 Body Mass Index (BMI): 24.1 Nutrition Risk Screening Items Score Screening NUTRITION RISK SCREEN: I have an illness or condition that made me change the kind and/or amount of food I eat 0 No I eat fewer than two meals per day 3 Yes I eat few fruits and vegetables, or milk products 0 No I have three or more drinks of beer, liquor or wine almost every day 0 No I have tooth or mouth problems that make it hard for me to eat 0 No I don't always have enough money to buy the food I need 0 No I eat alone most of the time 0 No I take three or more different prescribed or over-the-counter drugs a day 1 Yes Without wanting to, I have lost or gained 10 pounds in the last six months 0 No I am not always physically able to shop, cook and/or feed myself 0 No Nutrition Protocols Good Risk Protocol Moderate Risk Protocol 0 Provide education on nutrition High Risk Proctocol Risk Level: Moderate Risk Score: 4 Electronic Signature(s) Signed: 01/03/2021 11:54:59 AM By: Hansel Feinstein Entered By: Hansel Feinstein on 01/03/2021 09:52:34

## 2021-01-03 NOTE — Progress Notes (Signed)
[name] [name] ([id number]) Visit Report for 01/03/2021 Allergy List Details Patient Name: [name] [name] Date of Service: 01/03/2021 9:45 AM Medical Record Number: [id number] Patient Account Number: [id number] Date of Birth/Sex: [birth year] (80 y.o. M) Treating RN: [name] [name] Primary Care [name] [name]: [name] [name] Other Clinician: Referring [name] [name]: [name] [name] Treating [name] [name]/Extender: [name] [name] in Treatment: 0 Allergies Active Allergies No Known Allergies Allergy Notes Electronic Signature(s) Signed: 01/03/2021 11:54:59 AM By: [name] [name] Entered By: [name] [name] on 01/03/2021 09:53:29 [name] [name] ([id number]) -------------------------------------------------------------------------------- Arrival Information Details Patient Name: [name] [name] Date of Service: 01/03/2021 9:45 AM Medical Record Number: [id number] Patient Account Number: [id number] Date of Birth/Sex: 04-Oct-1940 (80 y.o. M) Treating RN: [name] [name] Primary Care [name] [name]: [name] [name] Other Clinician: Referring [name] [name]: [name] [name] Treating [name] [name]/Extender: [name] [name] in Treatment: 0 Visit Information Patient Arrived: [name] [name] Time: 09:39 Accompanied By: self Transfer Assistance: None Patient Identification Verified: Yes Secondary Verification Process Completed: Yes Patient Has Alerts: Yes Patient Alerts: Patient on Blood Thinner XARELTO Diabetic Electronic Signature(s) Signed: 01/03/2021 11:54:59 AM By: [name] [name] Entered By: [name] [name] on 01/03/2021 09:42:16 [name] [name] ([id number]) -------------------------------------------------------------------------------- Clinic Level of Care Assessment Details Patient Name: [name] [name] Date of Service: 01/03/2021 9:45 AM Medical Record Number: [id number] Patient Account Number: [id number] Date of Birth/Sex: 1940-12-23 (80 y.o. M) Treating RN: [name] [name] Primary Care Anasia Agro: [name] [name] Other Clinician: Referring  [name] [name]: [name] [name] Treating [name] [name]/Extender: [name] [name] in Treatment: 0 Clinic Level of Care Assessment Items TOOL 1 Quantity Score []  - Use when EandM and Procedure is performed on INITIAL visit 0 ASSESSMENTS - Nursing Assessment / Reassessment X - General Physical Exam (combine w/ comprehensive assessment (listed just below) when performed on new 1 20 pt. evals) X- 1 25 Comprehensive Assessment (HX, ROS, Risk Assessments, Wounds Hx, etc.) ASSESSMENTS - Wound and Skin Assessment / Reassessment []  - Dermatologic / Skin Assessment (not related to wound area) 0 ASSESSMENTS - Ostomy and/or Continence Assessment and Care []  - Incontinence Assessment and Management 0 []  - 0 Ostomy Care Assessment and Management (repouching, etc.) PROCESS - Coordination of Care X - Simple Patient / Family Education for ongoing care 1 15 []  - 0 Complex (extensive) Patient / Family Education for ongoing care X- 1 10 Staff obtains , Records, Test Results / Process Orders []  - 0 Staff telephones HHA, Nursing Homes / Clarify orders / etc []  - 0 Routine Transfer to another Facility (non-emergent condition) []  - 0 Routine Hospital Admission (non-emergent condition) X- 1 15 New Admissions / / Ordering NPWT, Apligraf, etc. []  - 0 Emergency Hospital Admission (emergent condition) PROCESS - Special Needs []  - Pediatric / Minor Patient Management 0 []  - 0 Isolation Patient Management []  - 0 Hearing / Language / Visual special needs []  - 0 Assessment of Community assistance (transportation, D/C planning, etc.) []  - 0 Additional assistance / Altered mentation []  - 0 Support Surface(s) Assessment (bed, cushion, seat, etc.) INTERVENTIONS - Miscellaneous []  - External ear exam 0 []  - 0 Patient Transfer (multiple staff / / Similar devices) []  - 0 Simple Staple / Suture removal (25 or less) []  - 0 Complex Staple / Suture removal (26 or more) []   - 0 Hypo/Hyperglycemic Management (do not check if billed separately) X- 1 15 Ankle / Brachial Index (ABI) - do not check if billed separately Has the patient been seen at the hospital within the last three years: Yes Total Score: 100 Level Of Care: New/Established - Level 3 [name], [name] (  [id number]) Electronic Signature(s) Signed: 01/03/2021 11:54:59 AM By: [name] [name] Entered By: [name] [name] on 01/03/2021 11:11:03 [name] [name] ([id number]) -------------------------------------------------------------------------------- Encounter Discharge Information Details Patient Name: [name] [name] Date of Service: 01/03/2021 9:45 AM Medical Record Number: [id number] Patient Account Number: [id number] Date of Birth/Sex: October 31, 1940 (80 y.o. M) Treating RN: [name] [name] Primary Care [name] [name]: [name] [name] Other Clinician: Referring [name] [name]: [name] [name] Treating [name] [name]/Extender: [name] [name] in Treatment: 0 Encounter Discharge Information Items Post Procedure Vitals Discharge Condition: Stable Temperature (F): 97.7 Ambulatory Status: Cane Pulse (bpm): 93 Discharge Destination: Home Respiratory Rate (breaths/min): 16 Transportation: Private Auto Blood Pressure (mmHg): 119/80 Accompanied By: son Schedule Follow-up Appointment: Yes Clinical Summary of Care: Electronic Signature(s) Signed: 01/03/2021 11:54:59 AM By: [name] [name] Entered By: [name] [name] on 01/03/2021 11:12:19 [name] [name] ([id number]) -------------------------------------------------------------------------------- Lower Extremity Assessment Details Patient Name: [name] [name] Date of Service: 01/03/2021 9:45 AM Medical Record Number: [id number] Patient Account Number: [id number] Date of Birth/Sex: July 02, 1940 (80 y.o. M) Treating RN: [name] [name] Primary Care [name] [name]: [name] [name] Other Clinician: Referring [name] [name]: [name] [name] Treating [name] [name]/Extender: [name] [name] in Treatment: 0 Edema  Assessment Assessed: [Left: No] [Right: Yes] Edema: [Left: N] [Right: o] Calf Left: Right: Point of Measurement: 38 cm From Medial Instep 32 cm Ankle Left: Right: Point of Measurement: 12 cm From Medial Instep 24 cm Knee To Floor Left: Right: From Medial Instep 47 cm Vascular Assessment Pulses: Dorsalis Pedis Palpable: [Right:No] Doppler Audible: [Right:Yes] Blood Pressure: Brachial: [Right:106] Ankle: [Right:Dorsalis Pedis: 48 0.45] Electronic Signature(s) Signed: 01/03/2021 11:54:59 AM By: [name] [name] Entered By: [name] [name] on 01/03/2021 10:01:55 [name] [name] ([id number]) -------------------------------------------------------------------------------- Multi Wound Chart Details Patient Name: [name] [name] Date of Service: 01/03/2021 9:45 AM Medical Record Number: [id number] Patient Account Number: [id number] Date of Birth/Sex: 06-24-1940 (80 y.o. M) Treating RN: [name] [name] Primary Care Cahlil Sattar: [name] [name] Other Clinician: Referring Kynzli Rease: [name] [name] Treating Broc Caspers/Extender: [name] [name] in Treatment: 0 Vital Signs Height(in): 72 Pulse(bpm): 93 Weight(lbs): 178 Blood Pressure(mmHg): 119/80 Body Mass Index(BMI): 24 Temperature(F): 97.7 Respiratory Rate(breaths/min): 16 Photos: [N/A:N/A] Wound Location: Right, Lateral Malleolus N/A N/A Wounding Event: Gradually Appeared N/A N/A Primary Etiology: Diabetic Wound/Ulcer of the Lower N/A N/A Extremity Comorbid History: Cataracts, Arrhythmia, Congestive N/A N/A Heart Failure, Hypertension, Type II Diabetes Date Acquired: 12/06/2020 N/A N/A [name] of Treatment: 0 N/A N/A Wound Status: Open N/A N/A Measurements L x W x D (cm) 2.7x3.8x0.8 N/A N/A Area (cm) : 8.058 N/A N/A Volume (cm) : 6.447 N/A N/A Classification: Grade 3 N/A N/A Exudate Amount: Medium N/A N/A Exudate Type: Serosanguineous N/A N/A Exudate Color: red, brown N/A N/A Foul Odor After Cleansing: Yes N/A N/A Odor Anticipated Due  to Product No N/A N/A Use: Granulation Amount: Small (1-33%) N/A N/A Granulation Quality: Red N/A N/A Necrotic Amount: Large (67-100%) N/A N/A Necrotic Tissue: Eschar, Adherent Slough N/A N/A Exposed Structures: Fat Layer (Subcutaneous Tissue): N/A N/A Yes Fascia: No Tendon: No Muscle: No Joint: No Bone: No Treatment Notes Electronic Signature(s) Signed: 01/03/2021 11:54:59 AM By: [name] [name] Entered By: [name] [name] on 01/03/2021 10:04:58 [name] [name] ([id number]) -------------------------------------------------------------------------------- Multi-Disciplinary Care Plan Details Patient Name: [name] [name] Date of Service: 01/03/2021 9:45 AM Medical Record Number: [id number] Patient Account Number: [id number] Date of Birth/Sex: 09/26/1940 (80 y.o. M) Treating RN: [name] [name] Primary Care Airelle Everding: [name] [name] Other Clinician: Referring Shannelle Alguire: [name] [name] Treating Stella Encarnacion/Extender: [name] [name] in Treatment: 0 Active Inactive Orientation to the Wound Care Program Nursing Diagnoses: Knowledge deficit related to the wound healing center program Goals:  Patient/caregiver will verbalize understanding of the Wound Healing Center Program Date Initiated: 01/03/2021 Target Resolution Date: 01/13/2021 Goal Status: Active Interventions: Provide education on orientation to the wound center Notes: Wound/Skin Impairment Nursing Diagnoses: Impaired tissue integrity Knowledge deficit related to smoking impact on wound healing Knowledge deficit related to ulceration/compromised skin integrity Goals: Patient/caregiver will verbalize understanding of skin care regimen Date Initiated: 01/03/2021 Target Resolution Date: 01/13/2021 Goal Status: Active Ulcer/skin breakdown will have a volume reduction of 30% by week 4 Date Initiated: 01/03/2021 Target Resolution Date: 02/02/2021 Goal Status: Active Ulcer/skin breakdown will have a volume reduction of 50% by week 8 Date  Initiated: 01/03/2021 Target Resolution Date: 03/05/2021 Goal Status: Active Ulcer/skin breakdown will have a volume reduction of 80% by week 12 Date Initiated: 01/03/2021 Target Resolution Date: 04/04/2021 Goal Status: Active Ulcer/skin breakdown will heal within 14 [name] Date Initiated: 01/03/2021 Target Resolution Date: 04/18/2021 Goal Status: Active Interventions: Assess patient/caregiver ability to obtain necessary supplies Assess patient/caregiver ability to perform ulcer/skin care regimen upon admission and as needed Assess ulceration(s) every visit Notes: Electronic Signature(s) Signed: 01/03/2021 11:54:59 AM By: [name] [name] Entered By: [name] [name] on 01/03/2021 10:04:46 [name] [name] ([id number]) -------------------------------------------------------------------------------- Pain Assessment Details Patient Name: [name] [name] Date of Service: 01/03/2021 9:45 AM Medical Record Number: [id number] Patient Account Number: [id number] Date of Birth/Sex: 1940-11-11 (80 y.o. M) Treating RN: [name] [name] Primary Care Ivelis Norgard: [name] [name] Other Clinician: Referring Rockne Dearinger: [name] [name] Treating Graeson Nouri/Extender: [name] [name] in Treatment: 0 Active Problems Location of Pain Severity and Description of Pain Patient Has Paino No Site Locations Rate the pain. Current Pain Level: 0 Pain Management and Medication Current Pain Management: Electronic Signature(s) Signed: 01/03/2021 11:54:59 AM By: [name] [name] Entered By: [name] [name] on 01/03/2021 09:42:54 [name] [name] ([id number]) -------------------------------------------------------------------------------- Patient/Caregiver Education Details Patient Name: [name] [name] Date of Service: 01/03/2021 9:45 AM Medical Record Number: [id number] Patient Account Number: [id number] Date of Birth/Gender: 1941-03-23 (80 y.o. M) Treating RN: [name] [name] Primary Care Physician: [name] [name] Other Clinician: Referring  Physician: Phineas [name] Treating Physician/Extender: [name] [name] in Treatment: 0 Education Assessment Education Provided To: Patient Education Topics Provided Basic Hygiene: Infection: Nutrition: Welcome To The [facility]: Wound Debridement: Wound/Skin Impairment: Electronic Signature(s) Signed: 01/03/2021 11:54:59 AM By: [name] [name] Entered By: [name] [name] on 01/03/2021 11:11:29 [name] [name] ([id number]) -------------------------------------------------------------------------------- Wound Assessment Details Patient Name: [name] [name] Date of Service: 01/03/2021 9:45 AM Medical Record Number: [id number] Patient Account Number: [id number] Date of Birth/Sex: [birth year] (80 y.o. M) Treating RN: [name] [name] Primary Care Alyse Kathan: [name] [name] Other Clinician: Referring Malya Cirillo: [name] [name] Treating Deanie Jupiter/Extender: [name] [name] in Treatment: 0 Wound Status Wound Number: 1 Primary Diabetic Wound/Ulcer of the Lower Extremity Etiology: Wound Location: Right, Lateral Malleolus Wound Status: Open Wounding Event: Gradually Appeared Comorbid Cataracts, Arrhythmia, Congestive Heart Failure, Date Acquired: 12/06/2020 History: Hypertension, Type II Diabetes [name] Of Treatment: 0 Clustered Wound: No Photos Wound Measurements Length: (cm) 2.7 Width: (cm) 3.8 Depth: (cm) 0.8 Area: (cm) 8.058 Volume: (cm) 6.447 % Reduction in Area: 0% % Reduction in Volume: 0% Tunneling: No Undermining: No Wound Description Classification: Grade 3 Exudate Amount: Medium Exudate Type: Serosanguineous Exudate Color: red, brown Foul Odor After Cleansing: Yes Due to Product Use: No Slough/Fibrino Yes Wound Bed Granulation Amount: Small (1-33%) Exposed Structure Granulation Quality: Red Fascia Exposed: No Necrotic Amount: Large (67-100%) Fat Layer (Subcutaneous Tissue) Exposed: Yes Necrotic Quality: Eschar, Adherent Slough Tendon Exposed: No Muscle Exposed:  No Joint Exposed: No Bone Exposed: No Treatment Notes Wound #1 (  Malleolus) Wound Laterality: Right, Lateral Cleanser Wound Cleanser Discharge Instruction: Wash your hands with soap and water. Remove old dressing, discard into plastic bag and place into trash. Cleanse the wound with Wound Cleanser prior to applying a clean dressing using gauze sponges, not tissues or cotton balls. Do not scrub or use excessive force. Pat dry using gauze sponges, not tissue or cotton balls. HILDING, MALLARY ([id number]) Peri-Wound Care Topical Primary Dressing Gauze Discharge Instruction: Lightly moisten with Dakins and pack lightly into woundwith saline or moistened with Dakins Solution Secondary Dressing ABD Pad 5x9 (in/in) Discharge Instruction: Cover with ABD pad Conforming Guaze Roll-Large Discharge Instruction: Apply Conforming Stretch Guaze Bandage as directed Secured With 104M Medipore H Soft Cloth Surgical Tape, 2x2 (in/yd) Compression Wrap Compression Stockings Add-Ons Electronic Signature(s) Signed: 01/03/2021 11:54:59 AM By: [name] [name] Entered By: [name] [name] on 01/03/2021 10:34:31 [name] [name] ([id number]) -------------------------------------------------------------------------------- Vitals Details Patient Name: [name] [name] Date of Service: 01/03/2021 9:45 AM Medical Record Number: [id number] Patient Account Number: [id number] Date of Birth/Sex: 05/04/1940 (80 y.o. M) Treating RN: [name] [name] Primary Care Daphanie Oquendo: [name] [name] Other Clinician: Referring Clarice Bonaventure: [name] [name] Treating Dehlia Kilner/Extender: [name] [name] in Treatment: 0 Vital Signs Time Taken: 09:39 Temperature (F): 97.7 Height (in): 72 Pulse (bpm): 93 Source: Stated Respiratory Rate (breaths/min): 16 Weight (lbs): 178 Blood Pressure (mmHg): 119/80 Source: Measured Reference Range: 80 - 120 mg / dl Body Mass Index (BMI): 24.1 Electronic Signature(s) Signed: 01/03/2021 11:54:59 AM By: [name] [name] Entered ByHansel [name] on 01/03/2021 09:43:32

## 2021-01-04 DIAGNOSIS — S91301D Unspecified open wound, right foot, subsequent encounter: Secondary | ICD-10-CM | POA: Diagnosis not present

## 2021-01-04 NOTE — Progress Notes (Signed)
BRAY, VICKERMAN ([id number]) Visit Report for 01/03/2021 Chief Complaint Document Details Patient Name: [name] [name], [name] [name] Date of Service: 01/03/2021 9:45 AM Medical Record Number: [id number] Patient Account Number: [id number] Date of Birth/Sex: Sep 09, 1940 (80 y.o. Male) Treating RN: Hansel Feinstein Primary Care Provider: Cheryll Dessert Other Clinician: Referring Provider: Phineas Semen Treating Provider/Extender: Rowan Blase in Treatment: 0 Information Obtained from: Patient Chief Complaint Right ankle ulcer Electronic Signature(s) Signed: 01/03/2021 10:42:19 AM By: Lenda Kelp PA-C Entered By: Lenda Kelp on 01/03/2021 10:42:19 [name] [name] ([id number]) -------------------------------------------------------------------------------- Debridement Details Patient Name: [name] [name] Date of Service: 01/03/2021 9:45 AM Medical Record Number: [id number] Patient Account Number: [id number] Date of Birth/Sex: 08-Feb-1941 (80 y.o. Male) Treating RN: Hansel Feinstein Primary Care Provider: Cheryll Dessert Other Clinician: Referring Provider: Phineas Semen Treating Provider/Extender: Rowan Blase in Treatment: 0 Debridement Performed for Wound #1 Right,Lateral Malleolus Assessment: Performed By: Physician Nelida Meuse., PA-C Debridement Type: Debridement Severity of Tissue Pre Debridement: Fat layer exposed Level of Consciousness (Pre- Awake and Alert procedure): Pre-procedure Verification/Time Out Yes - 10:45 Taken: Start Time: 10:46 Pain Control: Lidocaine Total Area Debrided (L x W): 2.7 (cm) x 3.8 (cm) = 10.26 (cm) Tissue and other material Non-Viable, Eschar, Slough, Biofilm, Slough debrided: Level: Non-Viable Tissue Debridement Description: Selective/Open Wound Instrument: Forceps, Scissors, Other : Dakins/gauze Bleeding: None Response to Treatment: Procedure was tolerated well Level of Consciousness (Post- Awake and Alert procedure): Post Debridement  Measurements of Total Wound Length: (cm) 2.7 Width: (cm) 3.8 Depth: (cm) 0.8 Volume: (cm) 6.447 Character of Wound/Ulcer Post Debridement: Improved Severity of Tissue Post Debridement: Fat layer exposed Post Procedure Diagnosis Same as Pre-procedure Electronic Signature(s) Signed: 01/03/2021 11:54:59 AM By: Hansel Feinstein Signed: 01/03/2021 6:07:16 PM By: Lenda Kelp PA-C Entered By: Hansel Feinstein on 01/03/2021 10:56:29 [name] [name] ([id number]) -------------------------------------------------------------------------------- HPI Details Patient Name: [name] [name] Date of Service: 01/03/2021 9:45 AM Medical Record Number: [id number] Patient Account Number: [id number] Date of Birth/Sex: 05-Jul-1940 (80 y.o. Male) Treating RN: Hansel Feinstein Primary Care Provider: Cheryll Dessert Other Clinician: Referring Provider: Phineas Semen Treating Provider/Extender: Rowan Blase in Treatment: 0 History of Present Illness HPI Description: 01/03/2021 upon evaluation today patient appears to be doing somewhat poorly in regard to wound on his right lateral ankle. This is quite significant based on what I see today. He tells me this is a site that back in 2006 he actually had a spider bite at this location that ended up requiring a fairly significant surgery to where he tells me that "the bone and there looks like a log lying in a creek". Nonetheless this sounds like it was a fairly significant wound at that time. Nonetheless its been okay for a number of years until more recently where unfortunately on around 12/06/2020 this began to breakdown and development what we are seeing currently. There is a significant amount of necrotic tissue in the central portion of the wound. More importantly and concerning [name] his ABI is very low at 0.45 I am concerned that this is an arterial issue obviously he is diabetic and that could be playing a role as well but nonetheless I do believe we need to focus and see  what we can do about getting him to vascular ASAP. The patient does have a history of diabetes mellitus type 2, hypertension, atrial fibrillation, and congestive heart failure. Electronic Signature(s) Signed: 01/03/2021 5:54:54 PM By: Lenda Kelp PA-C Entered By: Lenda Kelp on 01/03/2021 17:54:54 [name] [name] ([id number]) -------------------------------------------------------------------------------- Physical Exam Details Patient Name:  [name] [name] Date of Service: 01/03/2021 9:45 AM Medical Record Number: [id number] Patient Account Number: [id number] Date of Birth/Sex: 1941/04/05 (80 y.o. Male) Treating RN: Hansel Feinstein Primary Care Provider: Cheryll Dessert Other Clinician: Referring Provider: Phineas Semen Treating Provider/Extender: Rowan Blase in Treatment: 0 Constitutional sitting or standing blood pressure is within target range for patient.. pulse regular and within target range for patient.Marland Kitchen respirations regular, non- labored and within target range for patient.Marland Kitchen temperature within target range for patient.. Well-nourished and well-hydrated in no acute distress. Eyes conjunctiva clear no eyelid edema noted. pupils equal round and reactive to light and accommodation. Ears, Nose, Mouth, and Throat no gross abnormality of ear auricles or external auditory canals. normal hearing noted during conversation. mucus membranes moist. Respiratory normal breathing without difficulty. Cardiovascular Absent posterior tibial and dorsalis pedis pulses bilateral lower extremities. trace pitting edema of the bilateral lower extremities. Musculoskeletal normal gait and posture. no significant deformity or arthritic changes, no loss or range of motion, no clubbing. Psychiatric this patient is able to make decisions and demonstrates good insight into disease process. Alert and Oriented x 3. pleasant and cooperative. Notes Upon inspection patient's wound bed actually showed signs  of being fairly significant as far as the tissue breakdown at this point. Unfortunately we also did the MolecuLight DX imaging and there was significant and profound red/blush fluorescence noted in the wound as well as in the primary periphery of the wound extending up his leg and down into the ankle and foot region. Subsequently this does have me significantly concerned about infection he is recently been on Keflex in the past 2 weeks. Nonetheless this may still indicate a much more significant infection that needs to be addressed here. I do believe that as well however the arterial flow is a significant issue 2. I did actually perform debridement extremely lightly just to remove necrotic tissue on the surface of the wound this includes an eschar and slough there was no bleeding whatsoever and postdebridement the wound bed actually appeared to be doing a little better but they are still so much necrotic tissue were not even seeing anywhere close to good tissue here. Electronic Signature(s) Signed: 01/03/2021 5:56:05 PM By: Lenda Kelp PA-C Entered By: Lenda Kelp on 01/03/2021 17:56:05 [name] [name] ([id number]) -------------------------------------------------------------------------------- Physician Orders Details Patient Name: [name] [name] Date of Service: 01/03/2021 9:45 AM Medical Record Number: [id number] Patient Account Number: [id number] Date of Birth/Sex: 01-Apr-1941 (80 y.o. Male) Treating RN: Hansel Feinstein Primary Care Provider: Cheryll Dessert Other Clinician: Referring Provider: Phineas Semen Treating Provider/Extender: Rowan Blase in Treatment: 0 Verbal / Phone Orders: No Diagnosis Coding ICD-10 Coding Code Description E11.622 Type 2 diabetes mellitus with other skin 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 I10 Essential (primary) hypertension I48.0 Paroxysmal atrial fibrillation I50.42 Chronic combined systolic  (congestive) and diastolic (congestive) heart failure Follow-up Appointments o Return Appointment in 1 week. Bathing/ Shower/ Hygiene o May shower with wound dressing protected with water repellent cover or cast protector. o No tub bath. Edema Control - Lymphedema / Segmental Compressive Device / Other o Elevate leg(s) parallel to the floor when sitting. o DO YOUR BEST to sleep in the bed at night. DO NOT sleep in your recliner. Long hours of sitting in a recliner leads to swelling of the legs and/or potential wounds on your backside. Off-Loading o Other: - keep pressure off of the wound/right ankle to promote healing Medications-Please add to medication list. o Other: -  Pick up the Dakins solution at the pharmacy on file so you can do your dressings Wound Treatment Wound #1 - Malleolus Wound Laterality: Right, Lateral Cleanser: Wound Cleanser (DME) (Generic) 1 x Per Day/30 Days Discharge Instructions: Wash your hands with soap and water. Remove old dressing, discard into plastic bag and place into trash. Cleanse the wound with Wound Cleanser prior to applying a clean dressing using gauze sponges, not tissues or cotton balls. Do not scrub or use excessive force. Pat dry using gauze sponges, not tissue or cotton balls. Primary Dressing: Gauze (DME) (Generic) 1 x Per Day/30 Days Discharge Instructions: Lightly moisten with Dakins and pack lightly into woundwith saline or moistened with Dakins Solution Secondary Dressing: ABD Pad 5x9 (in/in) (DME) (Generic) 1 x Per Day/30 Days Discharge Instructions: Cover with ABD pad Secondary Dressing: Conforming Guaze Roll-Large (DME) (Generic) 1 x Per Day/30 Days Discharge Instructions: Apply Conforming Stretch Guaze Bandage as directed Secured With: 75M Medipore H Soft Cloth Surgical Tape, 2x2 (in/yd) (DME) (Generic) 1 x Per Day/30 Days Consults o Vascular - [facility] will call you for the appt which is needed  asap Laboratory o Bacteria identified in Wound by Culture (MICRO) - We will call you about picking up an antibiotic at the pharmacy on file oooo LOINC Code: 6462-6 KESTON, SEEVER ([id number]) oooo Convenience Name: Wound culture routine Electronic Signature(s) Signed: 01/03/2021 11:54:59 AM By: Hansel Feinstein Signed: 01/03/2021 6:07:16 PM By: Lenda Kelp PA-C Entered By: Hansel Feinstein on 01/03/2021 11:06:33 [name] [name] ([id number]) -------------------------------------------------------------------------------- Problem List Details Patient Name: [name] [name] Date of Service: 01/03/2021 9:45 AM Medical Record Number: [id number] Patient Account Number: [id number] Date of Birth/Sex: 03-Feb-1941 (80 y.o. Male) Treating RN: Hansel Feinstein Primary Care Provider: Cheryll Dessert Other Clinician: Referring Provider: Phineas Semen Treating Provider/Extender: Allen Derry Weeks in Treatment: 0 Active Problems ICD-10 Encounter Code Description Active Date MDM Diagnosis E11.622 Type 2 diabetes mellitus with other skin ulcer 01/03/2021 No Yes L97.312 Non-pressure chronic ulcer of right ankle with fat layer exposed 01/03/2021 No Yes I10 Essential (primary) hypertension 01/03/2021 No Yes I48.0 Paroxysmal atrial fibrillation 01/03/2021 No Yes I50.42 Chronic combined systolic (congestive) and diastolic (congestive) heart 01/03/2021 No Yes failure Inactive Problems Resolved Problems Electronic Signature(s) Signed: 01/03/2021 10:41:46 AM By: Lenda Kelp PA-C Entered By: Lenda Kelp on 01/03/2021 10:41:46 [name] [name] ([id number]) -------------------------------------------------------------------------------- Progress Note Details Patient Name: [name] [name] Date of Service: 01/03/2021 9:45 AM Medical Record Number: [id number] Patient Account Number: [id number] Date of Birth/Sex: April 06, 1941 (80 y.o. Male) Treating RN: Hansel Feinstein Primary Care Provider: Cheryll Dessert Other  Clinician: Referring Provider: Phineas Semen Treating Provider/Extender: Rowan Blase in Treatment: 0 Subjective Chief Complaint Information obtained from Patient Right ankle ulcer History of Present Illness (HPI) 01/03/2021 upon evaluation today patient appears to be doing somewhat poorly in regard to wound on his right lateral ankle. This is quite significant based on what I see today. He tells me this is a site that back in 2006 he actually had a spider bite at this location that ended up requiring a fairly significant surgery to where he tells me that "the bone and there looks like a log lying in a creek". Nonetheless this sounds like it was a fairly significant wound at that time. Nonetheless its been okay for a number of years until more recently where unfortunately on around 12/06/2020 this began to breakdown and development what we are seeing currently. There is a significant amount of necrotic tissue in the central portion  of the wound. More importantly and concerning [name] his ABI is very low at 0.45 I am concerned that this is an arterial issue obviously he is diabetic and that could be playing a role as well but nonetheless I do believe we need to focus and see what we can do about getting him to vascular ASAP. The patient does have a history of diabetes mellitus type 2, hypertension, atrial fibrillation, and congestive heart failure. Patient History Information obtained from Patient. Allergies No Known Allergies Social History Never smoker, Marital Status - Married, Alcohol Use - Never, Drug Use - No History, Caffeine Use - Rarely. Medical History Eyes Patient has history of Cataracts - hx Cardiovascular Patient has history of Arrhythmia - afib, Congestive Heart Failure, Hypertension Endocrine Patient has history of Type II Diabetes Patient is treated with Oral Agents. Blood sugar is not tested. Review of Systems (ROS) Constitutional Symptoms (General Health) Denies  complaints or symptoms of Fatigue, Fever, Chills, Marked Weight Change. Ear/Nose/Mouth/Throat Denies complaints or symptoms of Difficult clearing ears, Sinusitis. Hematologic/Lymphatic Denies complaints or symptoms of Bleeding / Clotting Disorders, Human Immunodeficiency Virus. Respiratory Complains or has symptoms of Shortness of Breath. Gastrointestinal Denies complaints or symptoms of Frequent diarrhea, Nausea, Vomiting. Genitourinary Denies complaints or symptoms of Kidney failure/ Dialysis, Incontinence/dribbling. Immunological Denies complaints or symptoms of Hives, Itching. Integumentary (Skin) Denies complaints or symptoms of Wounds, Bleeding or bruising tendency, Breakdown, Swelling. Musculoskeletal Denies complaints or symptoms of Muscle Pain, Muscle Weakness. Neurologic Denies complaints or symptoms of Numbness/parasthesias, Focal/Weakness. Psychiatric Denies complaints or symptoms of Anxiety, Claustrophobia. [facility], Jerolyn Shin ([id number]) Objective Constitutional sitting or standing blood pressure is within target range for patient.. pulse regular and within target range for patient.Marland Kitchen respirations regular, non- labored and within target range for patient.Marland Kitchen temperature within target range for patient.. Well-nourished and well-hydrated in no acute distress. Vitals Time Taken: 9:39 AM, Height: 72 in, Source: Stated, Weight: 178 lbs, Source: Measured, BMI: 24.1, Temperature: 97.7 F, Pulse: 93 bpm, Respiratory Rate: 16 breaths/min, Blood Pressure: 119/80 mmHg. Eyes conjunctiva clear no eyelid edema noted. pupils equal round and reactive to light and accommodation. Ears, Nose, Mouth, and Throat no gross abnormality of ear auricles or external auditory canals. normal hearing noted during conversation. mucus membranes moist. Respiratory normal breathing without difficulty. Cardiovascular Absent posterior tibial and dorsalis pedis pulses bilateral lower extremities. trace pitting  edema of the bilateral lower extremities. Musculoskeletal normal gait and posture. no significant deformity or arthritic changes, no loss or range of motion, no clubbing. Psychiatric this patient is able to make decisions and demonstrates good insight into disease process. Alert and Oriented x 3. pleasant and cooperative. General Notes: Upon inspection patient's wound bed actually showed signs of being fairly significant as far as the tissue breakdown at this point. Unfortunately we also did the MolecuLight DX imaging and there was significant and profound red/blush fluorescence noted in the wound as well as in the primary periphery of the wound extending up his leg and down into the ankle and foot region. Subsequently this does have me significantly concerned about infection he is recently been on Keflex in the past 2 weeks. Nonetheless this may still indicate a much more significant infection that needs to be addressed here. I do believe that as well however the arterial flow is a significant issue 2. I did actually perform debridement extremely lightly just to remove necrotic tissue on the surface of the wound this includes an eschar and slough there was no bleeding whatsoever and postdebridement the  wound bed actually appeared to be doing a little better but they are still so much necrotic tissue were not even seeing anywhere close to good tissue here. Integumentary (Hair, Skin) Wound #1 status is Open. Original cause of wound was Gradually Appeared. The date acquired was: 12/06/2020. The wound is located on the Right,Lateral Malleolus. The wound measures 2.7cm length x 3.8cm width x 0.8cm depth; 8.058cm^2 area and 6.447cm^3 volume. There is Fat Layer (Subcutaneous Tissue) exposed. There is no tunneling or undermining noted. There is a medium amount of serosanguineous drainage noted. Foul odor after cleansing was noted. There is small (1-33%) red granulation within the wound bed. There is a large  (67-100%) amount of necrotic tissue within the wound bed including Eschar and Adherent Slough. Assessment Active Problems ICD-10 Type 2 diabetes mellitus with other skin ulcer Non-pressure chronic ulcer of right ankle with fat layer exposed Essential (primary) hypertension Paroxysmal atrial fibrillation Chronic combined systolic (congestive) and diastolic (congestive) heart failure Procedures Wound #1 Pre-procedure diagnosis of Wound #1 is a Diabetic Wound/Ulcer of the Lower Extremity located on the Right,Lateral Malleolus .Severity of Tissue Pre Debridement is: Fat layer exposed. There was a Selective/Open Wound Non-Viable Tissue Debridement with a total area of 10.26 sq cm performed by Nelida Meuse., PA-C. With the following instrument(s): Forceps, Scissors, Dakins/gauze to remove Non-Viable tissue/material. Material removed includes Eschar, Slough, and Biofilm after achieving pain control using Lidocaine. A time out was conducted at 10:45, prior to the start of the procedure. There was no bleeding. The procedure was tolerated well. Post Debridement Measurements: 2.7cm length x 3.8cm Upshur, Lukus ([id number]) width x 0.8cm depth; 6.447cm^3 volume. Character of Wound/Ulcer Post Debridement is improved. Severity of Tissue Post Debridement is: Fat layer exposed. Post procedure Diagnosis Wound #1: Same as Pre-Procedure Plan Follow-up Appointments: Return Appointment in 1 week. Bathing/ Shower/ Hygiene: May shower with wound dressing protected with water repellent cover or cast protector. No tub bath. Edema Control - Lymphedema / Segmental Compressive Device / Other: Elevate leg(s) parallel to the floor when sitting. DO YOUR BEST to sleep in the bed at night. DO NOT sleep in your recliner. Long hours of sitting in a recliner leads to swelling of the legs and/or potential wounds on your backside. Off-Loading: Other: - keep pressure off of the wound/right ankle to promote  healing Medications-Please add to medication list.: Other: - Pick up the Dakins solution at the pharmacy on file so you can do your dressings Laboratory ordered were: Wound culture routine - We will call you about picking up an antibiotic at the pharmacy on file Consults ordered were: Vascular - [facility] will call you for the appt which is needed asap WOUND #1: - Malleolus Wound Laterality: Right, Lateral Cleanser: Wound Cleanser (DME) (Generic) 1 x Per Day/30 Days Discharge Instructions: Wash your hands with soap and water. Remove old dressing, discard into plastic bag and place into trash. Cleanse the wound with Wound Cleanser prior to applying a clean dressing using gauze sponges, not tissues or cotton balls. Do not scrub or use excessive force. Pat dry using gauze sponges, not tissue or cotton balls. Primary Dressing: Gauze (DME) (Generic) 1 x Per Day/30 Days Discharge Instructions: Lightly moisten with Dakins and pack lightly into woundwith saline or moistened with Dakins Solution Secondary Dressing: ABD Pad 5x9 (in/in) (DME) (Generic) 1 x Per Day/30 Days Discharge Instructions: Cover with ABD pad Secondary Dressing: Conforming Guaze Roll-Large (DME) (Generic) 1 x Per Day/30 Days Discharge Instructions: Apply Conforming  Stretch Guaze Bandage as directed Secured With: 22M Medipore H Soft Cloth Surgical Tape, 2x2 (in/yd) (DME) (Generic) 1 x Per Day/30 Days 1. Would recommend currently that we go ahead and initiate treatment with Dakin's moistened gauze dressing which I think is good to be the best way to go both to clean the periphery of the wound on the leg as well as packing the wound bed itself. I think this will help hopefully with clearing away some of the necrotic debris as well. 2. Muscle can recommend at this time that we have the patient cover this with an ABD pad and the roll gauze to secure in place. 3. I am also going to suggest based on what we are seeing  currently that we go ahead and obtain a wound culture. I am also going to hold off on any additional antibiotics until I see what the culture shows and subsequently how the patient does with cleaning utilizing the Dakin's as well. 4. 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 We will see patient back for reevaluation in 1 week here in the clinic. If anything worsens or changes patient will contact our office for additional recommendations. MolecuLight Medical Necessity Initial Evaluation of the wound with MolecuLightDX to determine Fluorescence bacterial imaging was medically necessary today due to: baseline bacterial bioburden level Wound(s) Scanned The following wound(s) were scanned with MolecuLight DX Right lateral ankle region MolecuLight Procedure The MolecularLight DX device was cleaned with a disinfectant wipe prior to use., The correct patient profile was confirmed and correct wound was verified., Range finder sensor used to ensure appropriate distance The following was completed: selected between imaging unit and wound bed, Room lights were turned off and the ambient light sensor was checked., Blue circle appeared around the lightbulb., The fluorescence icon was selected. Screen was tapped to enhance focus and the image was captured. Additional drapes were used to ensure adequate darknesso No MolecuLight Results BECKEM, TOMBERLIN ([id number]) The results of todayos scan revealed: Red Colors, Yellow Colors, Green Colors The indicated colors were noted in the following areas. In the periphery of the wound, In the center of the wound As a result of todayos scan, the following treatment plans were put in Culture sent and Dakin's moistened gauze packing started place. Potential ICD-10 Codes ICD-10 49.9 Bacterial infection unspecified (Red Color) Electronic  Signature(s) Signed: 01/03/2021 5:58:52 PM By: Lenda Kelp PA-C Entered By: Lenda Kelp on 01/03/2021 17:58:52 [name] [name] ([id number]) -------------------------------------------------------------------------------- ROS/PFSH Details Patient Name: [name] [name] Date of Service: 01/03/2021 9:45 AM Medical Record Number: [id number] Patient Account Number: [id number] Date of Birth/Sex: 1940-12-29 (80 y.o. Male) Treating RN: Hansel Feinstein Primary Care Provider: Cheryll Dessert Other Clinician: Referring Provider: Phineas Semen Treating Provider/Extender: Rowan Blase in Treatment: 0 Information Obtained From Patient Constitutional Symptoms (General Health) Complaints and Symptoms: Negative for: Fatigue; Fever; Chills; Marked Weight Change Ear/Nose/Mouth/Throat Complaints and Symptoms: Negative for: Difficult clearing ears; Sinusitis Hematologic/Lymphatic Complaints and Symptoms: Negative for: Bleeding / Clotting Disorders; Human Immunodeficiency Virus Respiratory Complaints and Symptoms: Positive for: Shortness of Breath Gastrointestinal Complaints and Symptoms: Negative for: Frequent diarrhea; Nausea; Vomiting Genitourinary Complaints and Symptoms: Negative for: Kidney failure/ Dialysis; Incontinence/dribbling Immunological Complaints and Symptoms: Negative for: Hives; Itching Integumentary (Skin) Complaints and Symptoms: Negative for: Wounds; Bleeding or bruising tendency; Breakdown; Swelling Musculoskeletal Complaints and Symptoms: Negative for: Muscle Pain; Muscle  Weakness Neurologic Complaints and Symptoms: Negative for: Numbness/parasthesias; Focal/Weakness Psychiatric Complaints and Symptoms: Negative for: Anxiety; Claustrophobia Fassler, Tyliek ([id number]) Eyes Medical History: Positive for: Cataracts - hx Cardiovascular Medical History: Positive for: Arrhythmia - afib; Congestive Heart Failure; Hypertension Endocrine Medical History: Positive  for: Type II Diabetes Time with diabetes: 2006 Treated with: Oral agents Blood sugar tested every day: No Oncologic HBO Extended History Items Eyes: Cataracts Immunizations Pneumococcal Vaccine: Received Pneumococcal Vaccination: No Implantable Devices None Family and Social History Never smoker; Marital Status - Married; Alcohol Use: Never; Drug Use: No History; Caffeine Use: Rarely; Financial Concerns: No; Food, Clothing or Shelter Needs: No; Support System Lacking: No; Transportation Concerns: No Electronic Signature(s) Signed: 01/03/2021 11:54:59 AM By: Hansel Feinstein Signed: 01/03/2021 6:07:16 PM By: Lenda Kelp PA-C Entered By: Hansel Feinstein on 01/03/2021 09:51:42 [name] [name] ([id number]) -------------------------------------------------------------------------------- SuperBill Details Patient Name: [name] [name] Date of Service: 01/03/2021 Medical Record Number: [id number] Patient Account Number: [id number] Date of Birth/Sex: [birth year] (80 y.o. 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 I10 Essential (primary) hypertension I48.0 Paroxysmal atrial fibrillation I50.42 Chronic combined systolic (congestive) and diastolic (congestive) heart failure Facility Procedures CPT4 Code: [id number] Description: 99213 - WOUND CARE VISIT-LEV 3 EST PT Modifier: Quantity: 1 CPT4 Code: 07121975 Description: 97597 - DEBRIDE WOUND 1ST 20 SQ CM OR < Modifier: Quantity: 1 CPT4 Code: Description: ICD-10 Diagnosis Description L97.312 Non-pressure chronic ulcer of right ankle with fat layer exposed Modifier: Quantity: CPT4 Code: 0598T Description: NONCNTACT RT FLORO WND 1ST  STE Modifier: Quantity: 1 CPT4 Code: Description: ICD-10 Diagnosis Description L97.312 Non-pressure chronic ulcer of right ankle with fat layer exposed Modifier: Quantity: Physician Procedures CPT4 Code: 8832549 Description: 99204 - WC PHYS LEVEL 4 - NEW PT Modifier: 25 Quantity: 1 CPT4 Code: Description: ICD-10 Diagnosis Description E11.622 Type 2 diabetes mellitus with other skin 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 I10 Essential (primary) hypertension I48.0 Paroxysmal atrial fibrillation Modifier: Quantity: CPT4 Code: 8264158 Description: 97597 - WC PHYS DEBR WO ANESTH 20 SQ CM Modifier: Quantity: 1 CPT4 Code: Description: ICD-10 Diagnosis Description L97.312 Non-pressure chronic ulcer of right ankle with fat layer exposed Modifier: Quantity: CPT4 Code: 0598T Description: HC MOLECULIGHT WND IMG 1ST ANATOMIC SITE Modifier: Quantity: 1 CPT4 Code: Description: ICD-10 Diagnosis Description L97.312 Non-pressure chronic ulcer of right ankle with fat layer exposed Modifier: Quantity: Electronic Signature(s) Signed: 01/03/2021 5:59:17 PM By: Lenda Kelp PA-C Previous Signature: 01/03/2021 11:54:40 AM Version By: Hansel Feinstein Entered By: Lenda Kelp on 01/03/2021 17:59:17

## 2021-01-06 LAB — AEROBIC CULTURE W GRAM STAIN (SUPERFICIAL SPECIMEN)

## 2021-01-10 ENCOUNTER — Encounter: Payer: Medicare HMO | Admitting: Physician Assistant

## 2021-01-10 ENCOUNTER — Other Ambulatory Visit (INDEPENDENT_AMBULATORY_CARE_PROVIDER_SITE_OTHER): Payer: Self-pay | Admitting: Physician Assistant

## 2021-01-10 ENCOUNTER — Other Ambulatory Visit: Payer: Self-pay

## 2021-01-10 DIAGNOSIS — E11622 Type 2 diabetes mellitus with other skin ulcer: Secondary | ICD-10-CM

## 2021-01-10 DIAGNOSIS — L97312 Non-pressure chronic ulcer of right ankle with fat layer exposed: Secondary | ICD-10-CM | POA: Diagnosis not present

## 2021-01-10 DIAGNOSIS — A499 Bacterial infection, unspecified: Secondary | ICD-10-CM | POA: Diagnosis not present

## 2021-01-10 DIAGNOSIS — E1151 Type 2 diabetes mellitus with diabetic peripheral angiopathy without gangrene: Secondary | ICD-10-CM | POA: Diagnosis not present

## 2021-01-10 DIAGNOSIS — I480 Paroxysmal atrial fibrillation: Secondary | ICD-10-CM

## 2021-01-10 DIAGNOSIS — I110 Hypertensive heart disease with heart failure: Secondary | ICD-10-CM | POA: Diagnosis not present

## 2021-01-10 DIAGNOSIS — I10 Essential (primary) hypertension: Secondary | ICD-10-CM

## 2021-01-10 DIAGNOSIS — I5042 Chronic combined systolic (congestive) and diastolic (congestive) heart failure: Secondary | ICD-10-CM | POA: Diagnosis not present

## 2021-01-10 NOTE — Progress Notes (Addendum)
VIDYUTH, BELSITO ([id number]) Visit Report for 01/10/2021 Chief Complaint Document Details Patient Name: [name] [name], [name] [name] Date of Service: 01/10/2021 3:15 PM Medical Record Number: [id number] Patient Account Number: [id number] Date of Birth/Sex: Sep 21, 1940 (80 y.o. M) Treating RN: Hansel Feinstein Primary Care Provider: Cheryll Dessert Other Clinician: Referring Provider: Cheryll Dessert Treating Provider/Extender: Rowan Blase in Treatment: 1 Information Obtained from: Patient Chief Complaint Right ankle ulcer Electronic Signature(s) Signed: 01/10/2021 3:56:18 PM By: Lenda Kelp PA-C Entered By: Lenda Kelp on 01/10/2021 15:56:18 [name] [name] ([id number]) -------------------------------------------------------------------------------- HPI Details Patient Name: [name] [name] Date of Service: 01/10/2021 3:15 PM Medical Record Number: [id number] Patient Account Number: [id number] Date of Birth/Sex: Dec 13, 1940 (80 y.o. M) Treating RN: Hansel Feinstein Primary Care Provider: Cheryll Dessert Other Clinician: Referring Provider: Cheryll Dessert Treating Provider/Extender: Rowan Blase in Treatment: 1 History of Present Illness HPI Description: 01/03/2021 upon evaluation today patient appears to be doing somewhat poorly in regard to wound on his right lateral ankle. This is quite significant based on what I see today. He tells me this is a site that back in 2006 he actually had a spider bite at this location that ended up requiring a fairly significant surgery to where he tells me that "the bone and there looks like a log lying in a creek". Nonetheless this sounds like it was a fairly significant wound at that time. Nonetheless its been okay for a number of years until more recently where unfortunately on around 12/06/2020 this began to breakdown and development what we are seeing currently. There is a significant amount of necrotic tissue in the central portion of the wound. More importantly  and concerning [name] his ABI is very low at 0.45 I am concerned that this is an arterial issue obviously he is diabetic and that could be playing a role as well but nonetheless I do believe we need to focus and see what we can do about getting him to vascular ASAP. The patient does have a history of diabetes mellitus type 2, hypertension, atrial fibrillation, and congestive heart failure. 01/10/2021 upon evaluation today patient appears to be doing well with regard to his wound. He has been tolerating the dressing changes without complication. The good news is the Levaquin does seem to be helping with overall clearing up the surface of the wound as well as the infection. We did do repeat fluorescence imaging today which showed that he had much less of the red fluorescence as compared to last week. This is great news. Still this was quite significant and still were not completely out of the water as far as bacterial load is concerned. Electronic Signature(s) Signed: 01/10/2021 5:51:37 PM By: Lenda Kelp PA-C Entered By: Lenda Kelp on 01/10/2021 17:51:37 [name] [name] ([id number]) -------------------------------------------------------------------------------- Physical Exam Details Patient Name: [name] [name] Date of Service: 01/10/2021 3:15 PM Medical Record Number: [id number] Patient Account Number: [id number] Date of Birth/Sex: Oct 31, 1940 (80 y.o. M) Treating RN: Hansel Feinstein Primary Care Provider: Cheryll Dessert Other Clinician: Referring Provider: Cheryll Dessert Treating Provider/Extender: Rowan Blase in Treatment: 1 Constitutional Well-nourished and well-hydrated in no acute distress. Respiratory normal breathing without difficulty. Psychiatric this patient is able to make decisions and demonstrates good insight into disease process. Alert and Oriented x 3. pleasant and cooperative. Notes Upon evaluation patient unfortunately continues to have significant issues with his  wounds currently. He has been tolerating the dressing changes without complication. With that being said he overall has been tolerating the Levaquin which is great news and  again the wound does appear to be doing better we started this on the 15th. He also has the appointment with vascular tomorrow which is good to be good as well to see what this shows and where we go from there I do believe his arterial flow is probably lacking. Electronic Signature(s) Signed: 01/10/2021 5:52:28 PM By: Lenda Kelp PA-C Entered By: Lenda Kelp on 01/10/2021 17:52:28 [name] [name] ([id number]) -------------------------------------------------------------------------------- Physician Orders Details Patient Name: [name] [name] Date of Service: 01/10/2021 3:15 PM Medical Record Number: [id number] Patient Account Number: [id number] Date of Birth/Sex: 06-Apr-1941 (80 y.o. 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 Bathing/ Shower/ Hygiene o May shower with wound dressing protected with water repellent cover or cast protector. o No tub bath. Edema Control - Lymphedema / Segmental Compressive Device / Other o Elevate leg(s) parallel to the floor when sitting. o DO YOUR BEST to sleep in the bed at night. DO NOT sleep in your recliner. Long hours of sitting in a recliner leads to swelling of the legs and/or potential wounds on your  backside. Off-Loading o Other: - keep pressure off of the wound/right ankle to promote healing Medications-Please add to medication list. o P.O. Antibiotics - Continue Levaquin antibiotic as ordered until they are completed Wound Treatment Wound #1 - Malleolus Wound Laterality: Right, Lateral Cleanser: Wound Cleanser (Generic) 1 x Per Day/30 Days Discharge Instructions: Wash your hands with soap and water. Remove old dressing, discard into plastic bag and place into trash. Cleanse the wound with Wound Cleanser prior to applying a clean dressing using gauze sponges, not tissues or cotton balls. Do not scrub or use excessive force. Pat dry using gauze sponges, not tissue or cotton balls. Primary Dressing: Gauze (Generic) 1 x Per Day/30 Days Discharge Instructions: Lightly moisten with Dakins and pack lightly into woundwith saline or moistened with Dakins Solution Secondary Dressing: ABD Pad 5x9 (in/in) (Generic) 1 x Per Day/30 Days Discharge Instructions: Cover with ABD pad Secondary Dressing: Conforming Guaze Roll-Large (Generic) 1 x Per Day/30 Days Discharge Instructions: Apply Conforming Stretch Guaze Bandage as directed Secured With: 70M Medipore H Soft Cloth Surgical Tape, 2x2 (in/yd) (Generic) 1 x Per Day/30 Days Notes Keep appointment at [facility] as scheduled 01/11/21 Electronic Signature(s) Signed: 01/10/2021 4:24:02 PM By: Hansel Feinstein Signed: 01/10/2021 6:26:07 PM By: Lenda Kelp PA-C Entered By: Hansel Feinstein on 01/10/2021 16:07:53 [name] [name] ([id number]) Cephus Shelling, [name] [name] ([id number]) -------------------------------------------------------------------------------- Problem List Details Patient Name: [name] [name] Date of Service: 01/10/2021 3:15 PM Medical Record Number: [id number] Patient Account Number: [id number] Date of Birth/Sex: 04/10/1941 (80 y.o. M) Treating RN: Hansel Feinstein Primary Care Provider: Cheryll Dessert Other Clinician: Referring Provider: Cheryll Dessert Treating Provider/Extender: Rowan Blase in Treatment: 1 Active Problems ICD-10 Encounter Code Description Active Date MDM Diagnosis E11.622 Type 2 diabetes mellitus with other skin ulcer 01/03/2021 No Yes L97.312 Non-pressure chronic ulcer of right ankle with fat layer exposed 01/03/2021 No Yes I10 Essential (primary) hypertension 01/03/2021 No Yes I48.0 Paroxysmal atrial fibrillation 01/03/2021 No Yes I50.42 Chronic combined systolic (congestive) and diastolic (congestive) heart  01/03/2021 No Yes failure Inactive Problems Resolved Problems Electronic Signature(s) Signed: 01/10/2021 3:56:12 PM By: Lenda Kelp PA-C Entered By: Lenda Kelp on 01/10/2021 15:56:12 [name] [name] ([id number]) -------------------------------------------------------------------------------- Progress Note Details Patient Name: [name] [name] Date of Service: 01/10/2021 3:15 PM Medical Record Number: [id number] Patient Account Number: [id number] Date of Birth/Sex: 1941/03/07 (80 y.o. M) Treating RN: Hansel Feinstein Primary Care Provider: Cheryll Dessert Other Clinician: Referring Provider: Cheryll Dessert Treating Provider/Extender: Rowan Blase in Treatment: 1 Subjective Chief Complaint Information obtained from Patient Right ankle ulcer History of Present Illness (HPI) 01/03/2021 upon evaluation today patient appears to be doing somewhat poorly in regard to wound on his right lateral ankle. This is quite significant based on what I see today. He tells me this is a site that back in 2006 he actually had a spider bite at this location that ended up requiring a fairly significant surgery to where he tells me that "the bone and there looks like a log lying in a creek". Nonetheless this sounds like it was a fairly significant wound at that time. Nonetheless its been okay for a number of years until more recently where unfortunately on around 12/06/2020 this began to breakdown and development  what we are seeing currently. There is a significant amount of necrotic tissue in the central portion of the wound. More importantly and concerning [name] his ABI is very low at 0.45 I am concerned that this is an arterial issue obviously he is diabetic and that could be playing a role as well but nonetheless I do believe we need to focus and see what we can do about getting him to vascular ASAP. The patient does have a history of diabetes mellitus type 2, hypertension, atrial fibrillation, and congestive heart failure. 01/10/2021 upon evaluation today patient appears to be doing well with regard to his wound. He has been tolerating the dressing changes without complication. The good news is the Levaquin does seem to be helping with overall clearing up the surface of the wound as well as the infection. We did do repeat fluorescence imaging today which showed that he had much less of the red fluorescence as compared to last week. This is great news. Still this was quite significant and still were not completely out of the water as far as bacterial load is concerned. Objective Constitutional Well-nourished and well-hydrated in no acute distress. Vitals Time Taken: 3:36 PM, Height: 72 in, Weight: 178 lbs, BMI: 24.1, Temperature: 98 F, Pulse: 69 bpm, Respiratory Rate: 18 breaths/min, Blood Pressure: 129/77 mmHg. Respiratory normal breathing without difficulty. Psychiatric this patient is able to make decisions and demonstrates good insight into disease process. Alert and Oriented x 3. pleasant and cooperative. General Notes: Upon evaluation patient unfortunately continues to have significant issues with his wounds currently. He has been tolerating the dressing changes without complication. With that being said he overall has been tolerating the Levaquin which is great news and again the wound does appear to be doing better we started this on the 15th. He also has the appointment with vascular tomorrow  which is good to be good as well to see what this shows and where we go from there I do believe his arterial flow is probably lacking. Integumentary (Hair, Skin) Wound #1 status is Open. Original cause of wound was Gradually Appeared. The date acquired was: 12/06/2020. The wound has been in treatment 1 weeks. The wound is located on the Right,Lateral Malleolus. The wound measures 5.6cm length x 3cm width  x 0.8cm depth; 13.195cm^2 area and 10.556cm^3 volume. There is Fat Layer (Subcutaneous Tissue) exposed. There is no tunneling or undermining noted. There is a medium amount of serosanguineous drainage noted. Foul odor after cleansing was noted. There is small (1-33%) red granulation within the wound bed. There is a large (67-100%) amount of necrotic tissue within the wound bed including Eschar and Adherent Slough. 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 Bathing/ Shower/ Hygiene: May shower with wound dressing protected with water repellent cover or cast protector. No tub bath. Edema Control - Lymphedema / Segmental Compressive Device / Other: Elevate leg(s) parallel to the floor when sitting. DO YOUR BEST to sleep in the bed at night. DO NOT sleep in your recliner. Long hours of sitting in a recliner leads to swelling of the legs and/or potential wounds on your backside. Off-Loading: Other: - keep pressure off of the wound/right ankle to promote healing Medications-Please add to medication list.: P.O. Antibiotics - Continue Levaquin antibiotic as ordered until they are completed General Notes: Keep appointment at [facility] as scheduled 01/11/21 WOUND #1:  - Malleolus Wound Laterality: Right, Lateral Cleanser: Wound Cleanser (Generic) 1 x Per Day/30 Days Discharge Instructions: Wash your hands with soap and water. Remove old dressing, discard into plastic bag and place into trash. Cleanse the wound with Wound Cleanser prior to applying a clean dressing using gauze sponges, not tissues or cotton balls. Do not scrub or use excessive force. Pat dry using gauze sponges, not tissue or cotton balls. Primary Dressing: Gauze (Generic) 1 x Per Day/30 Days Discharge Instructions: Lightly moisten with Dakins and pack lightly into woundwith saline or moistened with Dakins Solution Secondary Dressing: ABD Pad 5x9 (in/in) (Generic) 1 x Per Day/30 Days Discharge Instructions: Cover with ABD pad Secondary Dressing: Conforming Guaze Roll-Large (Generic) 1 x Per Day/30 Days Discharge Instructions: Apply Conforming Stretch Guaze Bandage as directed Secured With: 43M Medipore H Soft Cloth Surgical Tape, 2x2 (in/yd) (Generic) 1 x Per Day/30 Days 1. Would recommend currently that we going continue with the wound care measures as before and the patient is in agreement the plan this includes the use of the Dakin's moistened gauze dressing which I think is still good to be the best way to go. 2. Also continue with ABD pad to cover. 3. We will also use roll gauze to secure in place. We will see patient back for reevaluation in 1 week here in the clinic. If anything worsens or changes patient will contact our office for additional recommendations. MolecuLight DX: 1st Scanned Wound The following wound was scanned with MolecuLight DX): Right lateral malleolus ulcer Fluorescence bacterial imaging was medically necessary today due to Prior visit fluorescence image demonstrated a high bioburden load (Indication): monitoring impact of prescribe tx MolecuLight Results Red Colors, Green Colors The indicated colors were noted in the following area(s). In the center of the  wound As a result of todayos scan, the following treatment plans were put in Continue Levaquin and Dakin's moistened gauze cleaning/packing place. MolecuLight Procedure The MolecularLight DX device was cleaned with a disinfectant wipe prior to use., The correct patient profile was confirmed and correct wound was verified., Range finder sensor used to ensure appropriate distance The following was completed: selected between imaging unit and  wound bed, Room lights were turned off and the ambient light sensor was checked., Blue circle appeared around the lightbulb., The fluorescence icon was selected. Screen was tapped to enhance focus and the image was captured. Additional drapes were used to ensure adequate darknesso No [name] [name], [name] [name] ([id number]) Potential ICD-10 Codes ICD-10 A49.9 Bacterial infection unspecified (Red/Blush/Yellow Color) Additional Scanned Wounds Did you scan any additional Woundso No Electronic Signature(s) Signed: 01/10/2021 5:53:33 PM By: Lenda Kelp PA-C Entered By: Lenda Kelp on 01/10/2021 17:53:33 [name] [name] ([id number]) -------------------------------------------------------------------------------- SuperBill Details Patient Name: [name] [name] Date of Service: 01/10/2021 Medical Record Number: [id number] Patient Account Number: [id number] Date of Birth/Sex: 12-02-1940 (80 y.o. 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 I10 Essential (primary) hypertension I48.0 Paroxysmal atrial fibrillation I50.42 Chronic combined systolic (congestive) and diastolic (congestive) heart failure Facility Procedures CPT4 Code: [id number] Description: [phone]  - WOUND CARE VISIT-LEV 2 EST PT Modifier: Quantity: 1 CPT4 Code: [id number] Description: NONCNTACT RT FLORO WND 1ST STE (CDM [id number]) Modifier: Quantity: 1 CPT4 Code: Description: ICD-10 Diagnosis Description L97.312 Non-pressure chronic ulcer of right ankle with fat layer exposed Modifier: Quantity: Physician Procedures CPT4 Code: 9791504 Description: 99214 - WC PHYS LEVEL 4 - EST PT Modifier: 25 Quantity: 1 CPT4 Code: Description: ICD-10 Diagnosis Description E11.622 Type 2 diabetes mellitus with other 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

## 2021-01-11 ENCOUNTER — Ambulatory Visit (INDEPENDENT_AMBULATORY_CARE_PROVIDER_SITE_OTHER): Payer: Medicare HMO

## 2021-01-11 DIAGNOSIS — I480 Paroxysmal atrial fibrillation: Secondary | ICD-10-CM

## 2021-01-11 DIAGNOSIS — I5042 Chronic combined systolic (congestive) and diastolic (congestive) heart failure: Secondary | ICD-10-CM | POA: Diagnosis not present

## 2021-01-11 DIAGNOSIS — L98499 Non-pressure chronic ulcer of skin of other sites with unspecified severity: Secondary | ICD-10-CM

## 2021-01-11 DIAGNOSIS — L97312 Non-pressure chronic ulcer of right ankle with fat layer exposed: Secondary | ICD-10-CM

## 2021-01-11 DIAGNOSIS — I10 Essential (primary) hypertension: Secondary | ICD-10-CM | POA: Diagnosis not present

## 2021-01-11 DIAGNOSIS — E11622 Type 2 diabetes mellitus with other skin ulcer: Secondary | ICD-10-CM

## 2021-01-13 NOTE — Progress Notes (Signed)
[name] [name] [name] [name] ([id number]) Visit Report for 01/10/2021 Arrival Information Details Patient Name: [name] [name] [name] [name] Date of Service: 01/10/2021 3:15 PM Medical Record Number: [id number] Patient Account Number: [id number] Date of Birth/Sex: 20-Apr-1941 (80 y.o. M) Treating [name]: [name] [name] Primary Care [name] [name]: [name] [name] Other Clinician: Referring [name] [name]: [name] [name] Treating [name] [name]/Extender: [name] [name] in Treatment: 1 Visit Information History Since Last Visit All ordered tests and consults were completed: No Patient Arrived: [name] [name] Added or deleted any medications: No Arrival Time: 15:32 Any new allergies or adverse reactions: No Accompanied By: self Had a fall or experienced change in No Transfer Assistance: None activities of daily living that may affect Patient Identification Verified: Yes risk of falls: Secondary Verification Process Completed: Yes Signs or symptoms of abuse/neglect since last visito No Patient Requires Transmission-Based No Hospitalized since last visit: No Precautions: Implantable device outside of the clinic excluding No Patient Has Alerts: Yes cellular tissue based products placed in the center Patient Alerts: Patient on Blood since last visit: Thinner Has Dressing in Place as Prescribed: Yes XARELTO Pain Present Now: No Diabetic Electronic Signature(s) Signed: 01/13/2021 1:17:18 PM By: [name] [name] Entered By: [name] [name] on 01/10/2021 15:36:34 [name] [name] ([id number]) -------------------------------------------------------------------------------- Clinic Level of Care Assessment Details Patient Name: [name] [name] Date of Service: 01/10/2021 3:15 PM Medical Record Number: [id number] Patient Account Number: [id number] Date of Birth/Sex: [birth year] (80 y.o. M) Treating [name]: [name] [name] Primary Care [name] [name]: [name] [name] Other Clinician: Referring [name] [name]: [name] [name] Treating [name] [name]/Extender: [name] [name] in  Treatment: 1 Clinic Level of Care Assessment Items TOOL 4 Quantity Score []  - Use when only an EandM is performed on FOLLOW-UP visit 0 ASSESSMENTS - Nursing Assessment / Reassessment []  - Reassessment of Co-morbidities (includes updates in patient status) 0 []  - 0 Reassessment of Adherence to Treatment Plan ASSESSMENTS - Wound and Skin Assessment / Reassessment X - Simple Wound Assessment / Reassessment - one wound 1 5 []  - 0 Complex Wound Assessment / Reassessment - multiple wounds []  - 0 Dermatologic / Skin Assessment (not related to wound area) ASSESSMENTS - Focused Assessment []  - Circumferential Edema Measurements - multi extremities 0 []  - 0 Nutritional Assessment / Counseling / Intervention []  - 0 Lower Extremity Assessment (monofilament, tuning fork, pulses) []  - 0 Peripheral Arterial Disease Assessment (using hand held doppler) ASSESSMENTS - Ostomy and/or Continence Assessment and Care []  - Incontinence Assessment and Management 0 []  - 0 Ostomy Care Assessment and Management (repouching, etc.) PROCESS - Coordination of Care X - Simple Patient / Family Education for ongoing care 1 15 []  - 0 Complex (extensive) Patient / Family Education for ongoing care X- 1 10 Staff obtains , Records, Test Results / Process Orders []  - 0 Staff telephones HHA, Nursing Homes / Clarify orders / etc []  - 0 Routine Transfer to another Facility (non-emergent condition) []  - 0 Routine Hospital Admission (non-emergent condition) []  - 0 New Admissions / / Ordering NPWT, Apligraf, etc. []  - 0 Emergency Hospital Admission (emergent condition) X- 1 10 Simple Discharge Coordination []  - 0 Complex (extensive) Discharge Coordination PROCESS - Special Needs []  - Pediatric / Minor Patient Management 0 []  - 0 Isolation Patient Management []  - 0 Hearing / Language / Visual special needs []  - 0 Assessment of Community assistance (transportation, D/C planning,  etc.) []  - 0 Additional assistance / Altered mentation []  - 0 Support Surface(s) Assessment (bed, cushion, seat, etc.) INTERVENTIONS - Wound Cleansing / Measurement [name] [name] ( ) X- 1 5 Simple Wound Cleansing -  one wound []  - 0 Complex Wound Cleansing - multiple wounds X- 1 5 Wound Imaging (photographs - any number of wounds) []  - 0 Wound Tracing (instead of photographs) X- 1 5 Simple Wound Measurement - one wound []  - 0 Complex Wound Measurement - multiple wounds INTERVENTIONS - Wound Dressings X - Small Wound Dressing one or multiple wounds 1 10 []  - 0 Medium Wound Dressing one or multiple wounds []  - 0 Large Wound Dressing one or multiple wounds X- 1 5 Application of Medications - topical []  - 0 Application of Medications - injection INTERVENTIONS - Miscellaneous []  - External ear exam 0 []  - 0 Specimen Collection (cultures, biopsies, blood, body fluids, etc.) []  - 0 Specimen(s) / Culture(s) sent or taken to Lab for analysis []  - 0 Patient Transfer (multiple staff / / Similar devices) []  - 0 Simple Staple / Suture removal (25 or less) []  - 0 Complex Staple / Suture removal (26 or more) []  - 0 Hypo / Hyperglycemic Management (close monitor of Blood Glucose) []  - 0 Ankle / Brachial Index (ABI) - do not check if billed separately X- 1 5 Vital Signs Has the patient been seen at the hospital within the last three years: Yes Total Score: 75 Level Of Care: New/Established - Level 2 Electronic Signature(s) Signed: 01/10/2021 4:24:02 PM By: Entered By on 01/10/2021 16:09:32 ( ) -------------------------------------------------------------------------------- Encounter Discharge Information Details Patient Name: Date of Service: 01/10/2021 3:15 PM Medical Record Number: Patient Account Number: Nurse, adult Date of Birth/Sex: 03/31/1941 (80 y.o. M) Treating [name]: Primary Care Kade Demicco: Other Clinician: Referring Lorissa Kishbaugh: 01/12/2021 Treating Tekela Garguilo/Extender: [name] [name] in Treatment: 1 Encounter Discharge Information Items Discharge Condition: Stable Ambulatory Status: Cane Discharge Destination: Home Transportation: Private Auto Accompanied By: self Schedule Follow-up Appointment: Yes Clinical Summary of Care: Electronic Signature(s) Signed: 01/10/2021 4:24:02 PM By: 01/12/2021 Entered By: [name] [name] on 01/10/2021 16:12:58 [name] [name] (01/12/2021) -------------------------------------------------------------------------------- Lower Extremity Assessment Details Patient Name: [id number] Date of Service: 01/10/2021 3:15 PM Medical Record Number: 12/11/1940 Patient Account Number: (65 Date of Birth/Sex: 04-28-1940 (80 y.o. M) Treating [name]: [name] [name] Primary Care Gladis Soley: [name] [name] Other Clinician: Referring Kele Barthelemy: 01/12/2021 Treating Mariusz Jubb/Extender: [name] [name] in Treatment: 1 Edema Assessment Assessed: [Left: No] [Right: No] Edema: [Left: Ye] [Right: s] Calf Left: Right: Point of Measurement: 38 cm From Medial Instep 33 cm Ankle Left: Right: Point of Measurement: 12 cm From Medial Instep 29 cm Knee To Floor Left: Right: From Medial Instep 47 cm Vascular Assessment Pulses: Dorsalis Pedis Palpable: [Right:Yes] Electronic Signature(s) Signed: 01/13/2021 1:17:18 PM By: 01/12/2021 [name] Entered By: [name] [name] on 01/10/2021 15:39:51 [name] [name] (01/12/2021) -------------------------------------------------------------------------------- Multi Wound Chart Details Patient Name: [id number] Date of Service: 01/10/2021 3:15 PM Medical Record Number: 12/11/1940 Patient Account Number: [id number] Date of Birth/Sex: [birth year] (80 y.o. M) Treating [name]: [name] [name] Primary Care Vineta Carone: [name] [name] Other Clinician: Referring Lowella Kindley: 01/15/2021 Treating Keiondre Colee/Extender: [name] [name] in Treatment: 1 Vital Signs Height(in): 72 Pulse(bpm): 69 Weight(lbs): 178 Blood Pressure(mmHg): 129/77 Body Mass Index(BMI): 24 Temperature(F): 98 Respiratory Rate(breaths/min): 18 Photos: [N/A:N/A] Wound Location: Right, Lateral Malleolus N/A N/A Wounding Event: Gradually Appeared N/A N/A Primary Etiology: Diabetic Wound/Ulcer of the Lower N/A N/A Extremity Comorbid History: Cataracts, Arrhythmia, Congestive N/A N/A Heart Failure, Hypertension, Type II Diabetes Date Acquired: 12/06/2020 N/A N/A Weeks of Treatment: 1 N/A N/A Wound Status: Open N/A N/A Measurements L  x W x D (cm) 5.6x3x0.8 N/A N/A Area (cm) : 13.195 N/A N/A Volume (cm) : 10.556 N/A N/A % Reduction in Area: -63.80% N/A N/A % Reduction in Volume: -63.70% N/A N/A Classification: Grade 3 N/A N/A Exudate Amount: Medium N/A N/A Exudate Type: Serosanguineous N/A N/A Exudate Color: red, brown N/A N/A Foul Odor After Cleansing: Yes N/A N/A No N/A N/A [name] [name] [name] [name] ([id number]) Odor Anticipated Due to Product Use: Granulation Amount: Small (1-33%) N/A N/A Granulation Quality: Red N/A N/A Necrotic Amount: Large (67-100%) N/A N/A Necrotic Tissue: Eschar, Adherent Slough N/A N/A Exposed Structures: Fat Layer (Subcutaneous Tissue): N/A N/A Yes Fascia: No Tendon: No Muscle: No Joint: No Bone: No Treatment Notes Electronic Signature(s) Signed: 01/10/2021 4:24:02 PM By: [name] [name] Entered By: [name] [name] on 01/10/2021 16:06:39 [name] [name] ([id number]) -------------------------------------------------------------------------------- Multi-Disciplinary Care Plan Details Patient Name: [name] [name] Date of Service: 01/10/2021 3:15 PM Medical Record Number: [id number] Patient Account Number: [id number] Date of Birth/Sex: 1940/10/05 (80 y.o. M) Treating [name]: [name] [name] Primary Care Clarion Mooneyhan: [name] [name] Other Clinician: Referring Wilkin Lippy: [name] [name] Treating Barrie Wale/Extender: [name] [name] in Treatment: 1 Active Inactive Wound/Skin Impairment Nursing Diagnoses: Impaired tissue integrity Knowledge deficit related to smoking impact on wound healing Knowledge deficit related to ulceration/compromised skin integrity Goals: Patient/caregiver will verbalize understanding of skin care regimen Date Initiated: 01/03/2021 Target Resolution Date: 01/13/2021 Goal Status: Active Ulcer/skin breakdown will have a volume reduction of 30% by week 4 Date Initiated: 01/03/2021 Target Resolution Date: 02/02/2021 Goal Status: Active Ulcer/skin breakdown will have a volume reduction of 50% by week 8 Date Initiated: 01/03/2021 Target Resolution Date: 03/05/2021 Goal Status: Active Ulcer/skin breakdown will have a volume reduction of 80% by week 12 Date Initiated: 01/03/2021 Target Resolution Date: 04/04/2021 Goal Status: Active Ulcer/skin breakdown will heal within 14 weeks Date Initiated: 01/03/2021 Target Resolution Date: 04/18/2021 Goal Status: Active Interventions: Assess patient/caregiver ability to obtain necessary supplies Assess patient/caregiver ability to perform ulcer/skin care regimen upon admission and as needed Assess ulceration(s) every visit Notes: Electronic Signature(s) Signed: 01/10/2021 4:24:02 PM By: [name] [name] Entered By: [name] [name] on 01/10/2021 16:06:30 [name] [name] ([id number]) -------------------------------------------------------------------------------- Pain Assessment Details Patient Name: [name] [name] Date of Service: 01/10/2021 3:15 PM Medical Record Number: [id number] Patient Account Number: [id number] Date of Birth/Sex: 1940/11/12 (80 y.o. M) Treating [name]: [name] [name] Primary Care Etherine Mackowiak: [name] [name] Other Clinician: Referring Calin Fantroy: [name] [name] Treating Swathi Dauphin/Extender: [name] [name] in Treatment: 1 Active Problems Location of Pain Severity and Description of  Pain Patient Has Paino No Site Locations Pain Management and Medication Current Pain Management: Electronic Signature(s) Signed: 01/13/2021 1:17:18 PM By: [name] [name] Entered By: [name] [name] on 01/10/2021 15:37:00 [name] [name] ([id number]) -------------------------------------------------------------------------------- Patient/Caregiver Education Details Patient Name: [name] [name] Date of Service: 01/10/2021 3:15 PM Medical Record Number: [id number] Patient Account Number: [id number] Date of Birth/Gender: 10/13/1940 (80 y.o. M) Treating [name]: [name] [name] Primary Care Physician: [name] [name] Other Clinician: Referring Physician: Cheryll [name] Treating Physician/Extender: [name] [name] in Treatment: 1 Education Assessment Education Provided To: Patient Education Topics Provided Basic Hygiene: Wound/Skin Impairment: Electronic Signature(s) Signed: 01/10/2021 4:24:02 PM By: [name] [name] Entered By: [name] [name] on 01/10/2021 16:09:58 [name] [name] ([id number]) -------------------------------------------------------------------------------- Wound Assessment Details Patient Name: [name] [name] Date of Service: 01/10/2021 3:15 PM Medical Record Number: [id number] Patient Account Number: [id number] Date of Birth/Sex: Mar 15, 1941 (80 y.o. M) Treating [name]: [name] [name] Primary Care Nicolet Griffy: [name] [name] Other Clinician: Referring Fatiha Guzy: [name] [name] Treating Donavyn Fecher/Extender: [name] [name] in Treatment: 1 Wound Status Wound Number:  1 Primary Diabetic Wound/Ulcer of the Lower Extremity Etiology: Wound Location: Right, Lateral Malleolus Wound Status: Open Wounding Event: Gradually Appeared Comorbid Cataracts, Arrhythmia, Congestive Heart Failure, Date Acquired: 12/06/2020 History: Hypertension, Type II Diabetes Weeks Of Treatment: 1 Clustered Wound: No Photos Wound Measurements Length: (cm) 5.6 Width: (cm) 3 Depth: (cm) 0.8 Area: (cm) 13.195 Volume:  (cm) 10.556 % Reduction in Area: -63.8% % Reduction in Volume: -63.7% Tunneling: No Undermining: No Wound Description Classification: Grade 3 Exudate Amount: Medium Exudate Type: Serosanguineous Exudate Color: red, brown Foul Odor After Cleansing: Yes Due to Product Use: No Slough/Fibrino Yes Wound Bed Granulation Amount: Small (1-33%) Exposed Structure Granulation Quality: Red Fascia Exposed: No Necrotic Amount: Large (67-100%) Fat Layer (Subcutaneous Tissue) Exposed: Yes Necrotic Quality: Eschar, Adherent Slough Tendon Exposed: No Muscle Exposed: No Joint Exposed: No Bone Exposed: No Treatment Notes Wound #1 (Malleolus) Wound Laterality: Right, Lateral Cleanser Wound Cleanser Discharge Instruction: Wash your hands with soap and water. Remove old dressing, discard into plastic bag and place into trash. Cleanse the wound with Wound Cleanser prior to applying a clean dressing using gauze sponges, not tissues or cotton balls. Do not scrub or use excessive force. Pat dry using gauze sponges, not tissue or cotton balls. [name] [name] [name] [name] ([id number]) Peri-Wound Care Topical Primary Dressing Gauze Discharge Instruction: Lightly moisten with Dakins and pack lightly into woundwith saline or moistened with Dakins Solution Secondary Dressing ABD Pad 5x9 (in/in) Discharge Instruction: Cover with ABD pad Conforming Guaze Roll-Large Discharge Instruction: Apply Conforming Stretch Guaze Bandage as directed Secured With 45M Medipore H Soft Cloth Surgical Tape, 2x2 (in/yd) Compression Wrap Compression Stockings Add-Ons Electronic Signature(s) Signed: 01/10/2021 4:24:02 PM By: [name] [name] Signed: 01/13/2021 1:17:18 PM By: [name] [name] Previous Signature: 01/10/2021 3:42:32 PM Version By: [name] [name] Entered By: [name] [name] on 01/10/2021 16:02:17 [name] [name] ([id number]) -------------------------------------------------------------------------------- Vitals Details Patient Name:  [name] [name] Date of Service: 01/10/2021 3:15 PM Medical Record Number: [id number] Patient Account Number: [id number] Date of Birth/Sex: May 09, 1940 (80 y.o. M) Treating [name]: [name] [name] Primary Care Ho Parisi: [name] [name] Other Clinician: Referring Beecher Furio: [name] [name] Treating Sigourney Portillo/Extender: [name] [name] in Treatment: 1 Vital Signs Time Taken: 15:36 Temperature (F): 98 Height (in): 72 Pulse (bpm): 69 Weight (lbs): 178 Respiratory Rate (breaths/min): 18 Body Mass Index (BMI): 24.1 Blood Pressure (mmHg): 129/77 Reference Range: 80 - 120 mg / dl Electronic Signature(s) Signed: 01/13/2021 1:17:18 PM By: [name] [name] Entered By: [name] [name] on 01/10/2021 15:36:51

## 2021-01-17 ENCOUNTER — Encounter: Payer: Medicare HMO | Admitting: Internal Medicine

## 2021-01-17 ENCOUNTER — Other Ambulatory Visit: Payer: Self-pay

## 2021-01-17 DIAGNOSIS — I5042 Chronic combined systolic (congestive) and diastolic (congestive) heart failure: Secondary | ICD-10-CM | POA: Diagnosis not present

## 2021-01-17 DIAGNOSIS — E11622 Type 2 diabetes mellitus with other skin ulcer: Secondary | ICD-10-CM | POA: Diagnosis not present

## 2021-01-17 DIAGNOSIS — L97312 Non-pressure chronic ulcer of right ankle with fat layer exposed: Secondary | ICD-10-CM | POA: Diagnosis not present

## 2021-01-17 DIAGNOSIS — I480 Paroxysmal atrial fibrillation: Secondary | ICD-10-CM | POA: Diagnosis not present

## 2021-01-17 DIAGNOSIS — E1151 Type 2 diabetes mellitus with diabetic peripheral angiopathy without gangrene: Secondary | ICD-10-CM | POA: Diagnosis not present

## 2021-01-17 DIAGNOSIS — I110 Hypertensive heart disease with heart failure: Secondary | ICD-10-CM | POA: Diagnosis not present

## 2021-01-17 NOTE — Progress Notes (Signed)
NELSON, NOONE ([id number]) Visit Report for 01/17/2021 HPI Details Patient Name: [name] [name], [name] [name] Date of Service: 01/17/2021 11:30 AM Medical Record Number: [id number] Patient Account Number: [id number] Date of Birth/Sex: 1941/04/03 (80 y.o. M) Treating RN: Donnamarie Poag Primary Care Provider: Alanson Aly Other Clinician: Referring Provider: Alanson Aly Treating Provider/Extender: Tito Dine in Treatment: 2 History of Present Illness HPI Description: 01/03/2021 upon evaluation today patient appears to be doing somewhat poorly in regard to wound on his right lateral ankle. This is quite significant based on what I see today. He tells me this is a site that back in 2006 he actually had a spider bite at this location that ended up requiring a fairly significant surgery to where he tells me that "the bone and there looks like a log lying in a creek". Nonetheless this sounds like it was a fairly significant wound at that time. Nonetheless its been okay for a number of years until more recently where unfortunately on around 12/06/2020 this began to breakdown and development what we are seeing currently. There is a significant amount of necrotic tissue in the central portion of the wound. More importantly and concerning Truman Hayward his ABI is very low at 0.45 I am concerned that this is an arterial issue obviously he is diabetic and that could be playing a role as well but nonetheless I do believe we need to focus and see what we can do about getting him to vascular ASAP. The patient does have a history of diabetes mellitus type 2, hypertension, atrial fibrillation, and congestive heart failure. 01/10/2021 upon evaluation today patient appears to be doing well with regard to his wound. He has been tolerating the dressing changes without complication. The good news is the Levaquin does seem to be helping with overall clearing up the surface of the wound as well as the infection. We did do repeat  fluorescence imaging today which showed that he had much less of the red fluorescence as compared to last week. This is great news. Still this was quite significant and still were not completely out of the water as far as bacterial load is concerned. 01/17/2021; patient with a large necrotic wound over the right lateral malleolus. Arterial studies from 9/22 showed an ABI of 0.46 biphasic waveforms at the PTA on the right. His TBI is 0.24 with a toe pressure of 32 mmHg. This is indicative of severe PAD and possibly limb threatening ischemia. The patient does not describe a lot of pain except at night. He is a minimal ambulator. He is using been using Dakin's half-strength Electronic Signature(s) Signed: 01/17/2021 4:23:07 PM By: Linton Ham MD Entered By: Linton Ham on 01/17/2021 12:40:56 [name] [name] ([id number]) -------------------------------------------------------------------------------- Physical Exam Details Patient Name: [name] [name] Date of Service: 01/17/2021 11:30 AM Medical Record Number: [id number] Patient Account Number: [id number] Date of Birth/Sex: 22-Feb-1941 (80 y.o. M) Treating RN: Donnamarie Poag Primary Care Provider: Alanson Aly Other Clinician: Referring Provider: Alanson Aly Treating Provider/Extender: Tito Dine in Treatment: 2 Constitutional Patient is hypotensive.. Pulse regular and within target range for patient.Marland Kitchen Respirations regular, non-labored and within target range.. Temperature is normal and within the target range for the patient.Marland Kitchen appears in no distress. Cardiovascular Dorsalis pedis and posterior tibial pulses are absent on the right however the popliteal and femoral pulses are palpable. Notes Wound exam; right lateral malleolus completely necrotic surface with drainage. Odiferous. I think the bottom of this he will have exposed bone. No mechanical debridement. There is no surrounding soft  tissue infection Electronic  Signature(s) Signed: 01/17/2021 4:23:07 PM By: Linton Ham MD Entered By: Linton Ham on 01/17/2021 12:42:51 [name] [name] ([id number]) -------------------------------------------------------------------------------- Physician Orders Details Patient Name: [name] [name] Date of Service: 01/17/2021 11:30 AM Medical Record Number: [id number] Patient Account Number: [id number] Date of Birth/Sex: 02/17/1941 (80 y.o. M) Treating RN: Donnamarie Poag Primary Care Provider: Alanson Aly Other Clinician: Referring Provider: Alanson Aly Treating Provider/Extender: Tito Dine in Treatment: 2 Verbal / Phone Orders: No Diagnosis Coding Wound Treatment Wound #1 - Malleolus Wound Laterality: Right, Lateral Cleanser: Byram Ancillary Kit - 15 Day Supply (DME) (Generic) 1 x Per Day/15 Days Discharge Instructions: Use supplies as instructed; Kit contains: (15) Saline Bullets; (15) 3x3 Gauze; 15 pr Gloves Cleanser: Wound Cleanser (DME) (Generic) 1 x Per Day/15 Days Discharge Instructions: Wash your hands with soap and water. Remove old dressing, discard into plastic bag and place into trash. Cleanse the wound with Wound Cleanser prior to applying a clean dressing using gauze sponges, not tissues or cotton balls. Do not scrub or use excessive force. Pat dry using gauze sponges, not tissue or cotton balls. Primary Dressing: Silvercel 4 1/4x 4 1/4 (in/in) (DME) (Generic) 1 x Per Day/15 Days Discharge Instructions: Apply Silvercel 4 1/4x 4 1/4 (in/in) as instructed Secondary Dressing: ABD Pad 5x9 (in/in) (DME) (Generic) 1 x Per Day/15 Days Discharge Instructions: Cover with ABD pad Secondary Dressing: Conforming Guaze Roll-Large (DME) (Generic) 1 x Per Day/15 Days Discharge Instructions: Apply Conforming Stretch Bear as directed Secured With: 23M Medipore H Soft Cloth Surgical Tape, 2x2 (in/yd) (Generic) 1 x Per Day/15 Days Consults o Vascular - Referral sent 01/13/21  ASAP Electronic Signature(s) Signed: 01/17/2021 4:23:07 PM By: Linton Ham MD Signed: 01/17/2021 4:33:20 PM By: Donnamarie Poag Entered By: Donnamarie Poag on 01/17/2021 12:01:47 [name] [name] ([id number]) -------------------------------------------------------------------------------- Problem List Details Patient Name: [name] [name] Date of Service: 01/17/2021 11:30 AM Medical Record Number: [id number] Patient Account Number: [id number] Date of Birth/Sex: March 28, 1941 (80 y.o. 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 M) Treating RN: Donnamarie Poag Primary Care Provider: Alanson Aly Other Clinician: Referring Provider: Alanson Aly Treating Provider/Extender: Tito Dine in Treatment: 2 Subjective History of Present Illness (HPI) 01/03/2021 upon evaluation today patient appears to be doing somewhat poorly in regard to wound  on his right lateral ankle. This is quite significant based on what I see today. He tells me this is a site that back in 2006 he actually had a spider bite at this location that ended up requiring a fairly significant surgery to where he tells me that "the bone and there looks like a log lying in a creek". Nonetheless this sounds like it was a fairly significant wound at that time.  Nonetheless its been okay for a number of years until more recently where unfortunately on around 12/06/2020 this began to breakdown and development what we are seeing currently. There is a significant amount of necrotic tissue in the central portion of the wound. More importantly and concerning Truman Hayward his ABI is very low at 0.45 I am concerned that this is an arterial issue obviously he is diabetic and that could be playing a role as well but nonetheless I do believe we need to focus and see what we can do about getting him to vascular ASAP. The patient does have a history of diabetes mellitus type 2, hypertension, atrial fibrillation, and congestive heart failure. 01/10/2021 upon evaluation today patient appears to be doing well with regard to his wound. He has been tolerating the dressing changes without complication. The good news is the Levaquin does seem to be helping with overall clearing up the surface of the wound as well as the infection. We did do repeat fluorescence imaging today which showed that he had much less of the red fluorescence as compared to last week. This is great news. Still this was quite significant and still were not completely out of the water as far as bacterial load is concerned. 01/17/2021; patient with a large necrotic wound over the right lateral malleolus. Arterial studies from 9/22 showed an ABI of 0.46 biphasic waveforms at the PTA on the right. His TBI is 0.24 with a toe pressure of 32 mmHg. This is indicative of severe PAD and possibly limb threatening ischemia. The patient does not  describe a lot of pain except at night. He is a minimal ambulator. He is using been using Dakin's half-strength Objective Constitutional Patient is hypotensive.. Pulse regular and within target range for patient.Marland Kitchen Respirations regular, non-labored and within target range.. Temperature is normal and within the target range for the patient.Marland Kitchen appears in no distress. Vitals Time Taken: 11:37 AM, Height: 72 in, Weight: 178 lbs, BMI: 24.1, Temperature: 97.9 F, Pulse: 91 bpm, Respiratory Rate: 16 breaths/min, Blood Pressure: 86/61 mmHg. General Notes: denies dizziness; stated he didn't drink anything but one cup of juice, water given at office Cardiovascular Dorsalis pedis and posterior tibial pulses are absent on the right however the popliteal and femoral pulses are palpable. General Notes: Wound exam; right lateral malleolus completely necrotic surface with drainage. Odiferous. I think the bottom of this he will have exposed bone. No mechanical debridement. There is no surrounding soft tissue infection Integumentary (Hair, Skin) Wound #1 status is Open. Original cause of wound was Gradually Appeared. The date acquired was: 12/06/2020. The wound has been in treatment 2 weeks. The wound is located on the Right,Lateral Malleolus. The wound measures 5.4cm length x 2.2cm width x 1cm depth; 9.331cm^2 area and 9.331cm^3 volume. There is Fat Layer (Subcutaneous Tissue) exposed. There is no tunneling noted, however, there is undermining starting at 4:00 and ending at 6:00 with a maximum distance of 1.4cm. There is a large amount of serosanguineous drainage noted. There is small (1-33%) red granulation within the wound bed. There is a large (67-100%) amount of necrotic tissue within the wound bed including Eschar and Adherent Slough. Assessment Active Problems ICD-10 Type 2 diabetes mellitus with other skin ulcer Azeez, Chaze ([id number]) Non-pressure chronic ulcer of right ankle with fat layer  exposed Essential (primary) hypertension Paroxysmal atrial fibrillation Chronic combined systolic (congestive) and diastolic (congestive) heart failure Plan Consults ordered were: Vascular - Referral sent 01/13/21 ASAP WOUND #1: - Malleolus Wound Laterality: Right, Lateral  Cleanser: Byram Ancillary Kit - 15 Day Supply (DME) (Generic) 1 x Per Day/15 Days Discharge Instructions: Use supplies as instructed; Kit contains: (15) Saline Bullets; (15) 3x3 Gauze; 15 pr Gloves Cleanser: Wound Cleanser (DME) (Generic) 1 x Per Day/15 Days Discharge Instructions: Wash your hands with soap and water. Remove old dressing, discard into plastic bag and place into trash. Cleanse the wound with Wound Cleanser prior to applying a clean dressing using gauze sponges, not tissues or cotton balls. Do not scrub or use excessive force. Pat dry using gauze sponges, not tissue or cotton balls. Primary Dressing: Silvercel 4 1/4x 4 1/4 (in/in) (DME) (Generic) 1 x Per Day/15 Days Discharge Instructions: Apply Silvercel 4 1/4x 4 1/4 (in/in) as instructed Secondary Dressing: ABD Pad 5x9 (in/in) (DME) (Generic) 1 x Per Day/15 Days Discharge Instructions: Cover with ABD pad Secondary Dressing: Conforming Guaze Roll-Large (DME) (Generic) 1 x Per Day/15 Days Discharge Instructions: Apply Conforming Stretch Guaze Bandage as directed Secured With: 77M Medipore H Soft Cloth Surgical Tape, 2x2 (in/yd) (Generic) 1 x Per Day/15 Days 1. The patient is still on antibiotics. 2. Changed his primary dressing to silver alginate change daily because of drainage and odor. 3. We are going to work on getting this man in ASAP appointment with vein and vascular to review his noninvasive studies which show evidence of critical limb ischemia 4. The wound itself does not look good. Completely nonviable surface which I think probes down to bone. 5. Follow-up next week Electronic Signature(s) Signed: 01/17/2021 4:23:07 PM By: Linton Ham  MD Entered By: Linton Ham on 01/17/2021 12:44:20 [name] [name] ([id number]) -------------------------------------------------------------------------------- [facility] Details Patient Name: [name] [name] Date of Service: 01/17/2021 Medical Record Number: [id number] Patient Account Number: [id number] Date of Birth/Sex: 1941/01/29 (80 y.o. M) Treating RN: Donnamarie Poag Primary Care Provider: Alanson Aly Other Clinician: Referring Provider: Alanson Aly Treating Provider/Extender: Tito Dine in Treatment: 2 Diagnosis Coding ICD-10 Codes Code Description E11.622 Type 2 diabetes mellitus with other skin 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 I10 Essential (primary) hypertension I48.0 Paroxysmal atrial fibrillation I50.42 Chronic combined systolic (congestive) and diastolic (congestive) heart failure E11.51 Type 2 diabetes mellitus with diabetic peripheral angiopathy without gangrene Facility Procedures CPT4 Code: [id number] Description: 99213 - WOUND CARE VISIT-LEV 3 EST PT Modifier: Quantity: 1 Physician Procedures CPT4 Code: [id number] Description: 99214 - WC PHYS LEVEL 4 - EST PT Modifier: Quantity: 1 CPT4 Code: Description: ICD-10 Diagnosis Description Z04.045 Non-pressure chronic ulcer of right ankle with fat layer exposed E11.622 Type 2 diabetes mellitus with other skin ulcer E11.51 Type 2 diabetes mellitus with diabetic peripheral angiopathy without Modifier: gangrene Quantity: Electronic Signature(s) Signed: 01/17/2021 4:23:07 PM By: Linton Ham MD Entered By: Linton Ham on 01/17/2021 12:45:17

## 2021-01-17 NOTE — Progress Notes (Signed)
[name] [name], [name] [name] ([id number]) Visit Report for 01/17/2021 Arrival Information Details Patient Name: [name] [name], [name] [name] Date of Service: 01/17/2021 11:30 AM Medical Record Number: [id number] Patient Account Number: [id number] Date of Birth/Sex: [birth year] (80 y.o. M) Treating RN: Donnamarie Poag Primary Care Runette Scifres: Alanson Aly Other Clinician: Referring Melton Walls: Alanson Aly Treating Easton Fetty/Extender: Tito Dine in Treatment: 2 Visit Information History Since Last Visit Added or deleted any medications: No Patient Arrived: Wheel Chair Had a fall or experienced change in No Arrival Time: 11:34 activities of daily living that may affect Accompanied By: self risk of falls: Transfer Assistance: EasyPivot Patient Lift Hospitalized since last visit: No Patient Identification Verified: Yes Has Dressing in Place as Prescribed: Yes Secondary Verification Process Completed: Yes Pain Present Now: No Patient Requires Transmission-Based No Precautions: Patient Has Alerts: Yes Patient Alerts: Patient on Blood Thinner [place] Diabetic Electronic Signature(s) Signed: 01/17/2021 4:33:20 PM By: Donnamarie Poag Entered By: Donnamarie Poag on 01/17/2021 11:37:58 [name] [name] ([id number]) -------------------------------------------------------------------------------- Clinic Level of Care Assessment Details Patient Name: [name] [name] Date of Service: 01/17/2021 11:30 AM Medical Record Number: [id number] Patient Account Number: [id number] Date of Birth/Sex: 10-22-1940 (80 y.o. M) Treating RN: Donnamarie Poag Primary Care Serai Tukes: Alanson Aly Other Clinician: Referring Vandella Ord: Alanson Aly Treating Ananda Caya/Extender: Tito Dine in Treatment: 2 Clinic Level of Care Assessment Items TOOL 4 Quantity Score _0  - Use when only an EandM is performed on FOLLOW-UP visit 0 ASSESSMENTS - Nursing Assessment / Reassessment _1  - Reassessment of Co-morbidities (includes updates in  patient status) 0 _2  - 0 Reassessment of Adherence to Treatment Plan ASSESSMENTS - Wound and Skin Assessment / Reassessment X - Simple Wound Assessment / Reassessment - one wound 1 5 _3  - 0 Complex Wound Assessment / Reassessment - multiple wounds _4  - 0 Dermatologic / Skin Assessment (not related to wound area) ASSESSMENTS - Focused Assessment _5  - Circumferential Edema Measurements - multi extremities 0 _6  - 0 Nutritional Assessment / Counseling / Intervention _7  - 0 Lower Extremity Assessment (monofilament, tuning fork, pulses) _8  - 0 Peripheral Arterial Disease Assessment (using hand held doppler) ASSESSMENTS - Ostomy and/or Continence Assessment and Care _9  - Incontinence Assessment and Management 0 _10  - 0 Ostomy Care Assessment and Management (repouching, etc.) PROCESS - Coordination of Care X - Simple Patient / Family Education for ongoing care 1 15 _11  - 0 Complex (extensive) Patient / Family Education for ongoing care X- 1 10 Staff obtains Consents, Records, Test Results / Process Orders _12  - 0 Staff telephones HHA, Nursing Homes / Clarify orders / etc _13  - 0 Routine Transfer to another Facility (non-emergent condition) _14  - 0 Routine Hospital Admission (non-emergent condition) _15  - 0 New Admissions / Biomedical engineer / Ordering NPWT, Apligraf, etc. _16  - 0 Emergency Hospital Admission (emergent condition) X- 1 10 Simple Discharge Coordination _17  - 0 Complex (extensive) Discharge Coordination PROCESS - Special Needs _18  - Pediatric / Minor Patient Management 0 _19  - 0 Isolation Patient Management _20  - 0 Hearing / Language / Visual special needs _21  - 0 Assessment of Community assistance (transportation, D/C planning, etc.) _22  - 0 Additional assistance / Altered mentation _23  - 0 Support Surface(s) Assessment (bed, cushion, seat, etc.) INTERVENTIONS - Wound Cleansing / Measurement [name], [name] ([id number]) X- 1 5 Simple Wound Cleansing - one wound _24   - 0 Complex Wound Cleansing - multiple wounds X- 1 5 Wound Imaging (photographs - any number of wounds) _25  - 0 Wound Tracing (instead of photographs) X- 1 5 Simple Wound Measurement - one  wound _0  - 0 Complex Wound Measurement - multiple wounds INTERVENTIONS - Wound Dressings _1  - Small Wound Dressing one or multiple wounds 0 X- 1 15 Medium Wound Dressing one or multiple wounds _2  - 0 Large Wound Dressing one or multiple wounds X- 1 5 Application of Medications - topical <DGLOVFIEPPIRJJOA>_4<\/[id number]>_5  - 0 Application of Medications - injection INTERVENTIONS - Miscellaneous _4  - External ear exam 0 _5  - 0 Specimen Collection (cultures, biopsies, blood, body fluids, etc.) _6  - 0 Specimen(s) / Culture(s) sent or taken to Lab for analysis _7  - 0 Patient Transfer (multiple staff / Harrel Lemon Lift / Similar devices) _8  - 0 Simple Staple / Suture removal (25 or less) _9  - 0 Complex Staple / Suture removal (26 or more) _10  - 0 Hypo / Hyperglycemic Management (close monitor of Blood Glucose) _11  - 0 Ankle / Brachial Index (ABI) - do not check if billed separately X- 1 5 Vital Signs Has the patient been seen at the hospital within the last three years: Yes Total Score: 80 Level Of Care: New/Established - Level 3 Electronic Signature(s) Signed: 01/17/2021 4:33:20 PM By: Donnamarie Poag Entered By: Donnamarie Poag on 01/17/2021 12:07:16 [name] [name] ([id number]) -------------------------------------------------------------------------------- Encounter Discharge Information Details Patient Name: [name] [name] Date of Service: 01/17/2021 11:30 AM Medical Record Number: [id number] Patient Account Number: [id number] Date of Birth/Sex: Dec 28, 1940 (80 y.o. M) Treating RN: Donnamarie Poag Primary Care Brentney Goldbach: Alanson Aly Other Clinician: Referring Darol Cush: Alanson Aly Treating Louden Houseworth/Extender: Tito Dine in Treatment: 2 Encounter Discharge Information Items Discharge Condition: Stable Ambulatory  Status: Wheelchair Discharge Destination: Home Transportation: Private Auto Accompanied By: son Schedule Follow-up Appointment: Yes Clinical Summary of Care: Electronic Signature(s) Signed: 01/17/2021 4:33:20 PM By: Donnamarie Poag Entered By: Donnamarie Poag on 01/17/2021 12:09:29 [name] [name] ([id number]) -------------------------------------------------------------------------------- Lower Extremity Assessment Details Patient Name: [name] [name] Date of Service: 01/17/2021 11:30 AM Medical Record Number: [id number] Patient Account Number: [id number] Date of Birth/Sex: 04/20/1941 (80 y.o. M) Treating RN: Donnamarie Poag Primary Care Donterrius Santucci: Alanson Aly Other Clinician: Referring Hung Rhinesmith: Alanson Aly Treating Marieta Markov/Extender: Tito Dine in Treatment: 2 Edema Assessment Assessed: [Left: No] [Right: Yes] Edema: [Left: N] [Right: o] Calf Left: Right: Point of Measurement: 38 cm From Medial Instep 33 cm Ankle Left: Right: Point of Measurement: 12 cm From Medial Instep 23 cm Knee To Floor Left: Right: From Medial Instep 47 cm Electronic Signature(s) Signed: 01/17/2021 4:33:20 PM By: Donnamarie Poag Entered By: Donnamarie Poag on 01/17/2021 11:44:13 [facility], Leane Para ([id number]) -------------------------------------------------------------------------------- Multi Wound Chart Details Patient Name: [name] [name] Date of Service: 01/17/2021 11:30 AM Medical Record Number: [id number] Patient Account Number: [id number] Date of Birth/Sex: [birth year] (80 y.o. M) Treating RN: Donnamarie Poag Primary Care Mckaylah Bettendorf: Alanson Aly Other Clinician: Referring Mairi Stagliano: Alanson Aly Treating Sivan Cuello/Extender: Tito Dine in Treatment: 2 Vital Signs Height(in): 72 Pulse(bpm): 38 Weight(lbs): 178 Blood Pressure(mmHg): 86/61 Body Mass Index(BMI): 24 Temperature(F): 97.9 Respiratory Rate(breaths/min): 16 Photos: [N/A:N/A] Wound Location: Right, Lateral Malleolus N/A  N/A Wounding Event: Gradually Appeared N/A N/A Primary Etiology: Diabetic Wound/Ulcer of the Lower N/A N/A Extremity Comorbid History: Cataracts, Arrhythmia, Congestive N/A N/A Heart Failure, Hypertension, Type II Diabetes Date Acquired: 12/06/2020 N/A N/A Weeks of Treatment: 2 N/A N/A Wound Status: Open N/A N/A Measurements L x W x D (cm) 5.4x2.2x1 N/A N/A Area (cm) : 9.331 N/A N/A Volume (cm) : 9.331 N/A N/A % Reduction in Area: -15.80% N/A N/A % Reduction in Volume: -44.70% N/A N/A Starting Position 1 (o'clock): 4 Ending Position 1 (o'clock): 6  Maximum Distance 1 (cm): 1.4 Undermining: Yes N/A N/A Classification: Grade 3 N/A N/A Exudate Amount: Large N/A N/A Exudate Type: Serosanguineous N/A N/A Exudate Color: red, brown N/A N/A Granulation Amount: Small (1-33%) N/A N/A Granulation Quality: Red N/A N/A Necrotic Amount: Large (67-100%) N/A N/A Necrotic Tissue: Eschar, Adherent Slough N/A N/A Exposed Structures: Fat Layer (Subcutaneous Tissue): N/A N/A Yes Fascia: No Tendon: No Muscle: No Joint: No Bone: No Treatment Notes Wound #1 (Malleolus) Wound Laterality: Right, Lateral Cleanser [place] Ancillary Kit - 15 Day Supply [facility], Leane Para ([id number]) Discharge Instruction: Use supplies as instructed; Kit contains: (15) Saline Bullets; (15) 3x3 Gauze; 15 pr Gloves Wound Cleanser Discharge Instruction: Wash your hands with soap and water. Remove old dressing, discard into plastic bag and place into trash. Cleanse the wound with Wound Cleanser prior to applying a clean dressing using gauze sponges, not tissues or cotton balls. Do not scrub or use excessive force. Pat dry using gauze sponges, not tissue or cotton balls. Peri-Wound Care Topical Primary Dressing Silvercel 4 1/4x 4 1/4 (in/in) Discharge Instruction: Apply Silvercel 4 1/4x 4 1/4 (in/in) as instructed Secondary Dressing ABD Pad 5x9 (in/in) Discharge Instruction: Cover with ABD pad [place] Discharge Instruction: Apply Conforming Stretch Guaze Bandage as directed Secured With [place] Surgical Tape, 2x2 (in/yd) Compression Wrap Compression Stockings Add-Ons Electronic Signature(s) Signed: 01/17/2021 4:23:07 PM By: Linton Ham MD Entered By: Linton Ham on 01/17/2021 12:38:41 [name] [name] ([id number]) -------------------------------------------------------------------------------- Multi-Disciplinary Care Plan Details Patient Name: [name] [name] Date of Service: 01/17/2021 11:30 AM Medical Record Number: [id number] Patient Account Number: [id number] Date of Birth/Sex: 07-06-1940 (80 y.o. M) Treating RN: Donnamarie Poag Primary Care Crespin Forstrom: Alanson Aly Other Clinician: Referring Sherrell Farish: Alanson Aly Treating Matei Magnone/Extender: Tito Dine in Treatment: 2 Active Inactive Wound/Skin Impairment Nursing Diagnoses: Impaired tissue integrity Knowledge deficit related to smoking impact on wound healing Knowledge deficit related to ulceration/compromised skin integrity Goals: Patient/caregiver will verbalize understanding of skin care regimen Date Initiated: 01/03/2021 Date Inactivated: 01/17/2021 Target Resolution Date: 01/13/2021 Goal Status: Met Ulcer/skin breakdown will have a volume reduction of 30% by week 4 Date Initiated: 01/03/2021 Target Resolution Date: 02/02/2021 Goal Status: Active Ulcer/skin breakdown will have a volume reduction of 50% by week 8 Date Initiated: 01/03/2021 Target Resolution Date: 03/05/2021 Goal Status: Active Ulcer/skin breakdown will have a volume reduction of 80% by week 12 Date Initiated: 01/03/2021 Target Resolution Date: 04/04/2021 Goal Status: Active Ulcer/skin breakdown will heal within 14 weeks Date Initiated: 01/03/2021 Target Resolution Date: 04/18/2021 Goal Status: Active Interventions: Assess patient/caregiver ability to obtain necessary supplies Assess patient/caregiver  ability to perform ulcer/skin care regimen upon admission and as needed Assess ulceration(s) every visit Notes: Electronic Signature(s) Signed: 01/17/2021 4:33:20 PM By: Donnamarie Poag Entered By: Donnamarie Poag on 01/17/2021 11:52:37 [name] [name] ([id number]) -------------------------------------------------------------------------------- Pain Assessment Details Patient Name: [name] [name] Date of Service: 01/17/2021 11:30 AM Medical Record Number: [id number] Patient Account Number: [id number] Date of Birth/Sex: 04-Dec-1940 (80 y.o. M) Treating RN: Donnamarie Poag Primary Care Danyele Smejkal: Alanson Aly Other Clinician: Referring Ellise Kovack: Alanson Aly Treating Randalyn Ahmed/Extender: Tito Dine in Treatment: 2 Active Problems Location of Pain Severity and Description of Pain Patient Has Paino No Site Locations Rate the pain. Current Pain Level: 0 Pain Management and Medication Current Pain Management: Electronic Signature(s) Signed: 01/17/2021 4:33:20 PM By: Donnamarie Poag Entered By: Donnamarie Poag on 01/17/2021 11:39:17 [name] [name] ([id number]) -------------------------------------------------------------------------------- Patient/Caregiver Education Details Patient Name: [name] [name] Date of Service: 01/17/2021 11:30 AM Medical Record  Number: [id number] Patient Account Number: [id number] Date of Birth/Gender: July 20, 1940 (80 y.o. M) Treating RN: Donnamarie Poag Primary Care Physician: Alanson Aly Other Clinician: Referring Physician: Alanson Aly Treating Physician/Extender: Tito Dine in Treatment: 2 Education Assessment Education Provided To: Patient and Caregiver Education Topics Provided Basic Hygiene: Wound Debridement: Wound/Skin Impairment: Electronic Signature(s) Signed: 01/17/2021 4:33:20 PM By: Donnamarie Poag Entered By: Donnamarie Poag on 01/17/2021 12:08:33 [name] [name]  ([id number]) -------------------------------------------------------------------------------- Wound Assessment Details Patient Name: [name] [name] Date of Service: 01/17/2021 11:30 AM Medical Record Number: [id number] Patient Account Number: [id number] Date of Birth/Sex: [birth year] (80 y.o. M) Treating RN: Donnamarie Poag Primary Care Giorgia Wahler: Alanson Aly Other Clinician: Referring Keirston Saephanh: Alanson Aly Treating Darsh Vandevoort/Extender: Tito Dine in Treatment: 2 Wound Status Wound Number: 1 Primary Diabetic Wound/Ulcer of the Lower Extremity Etiology: Wound Location: Right, Lateral Malleolus Wound Status: Open Wounding Event: Gradually Appeared Comorbid Cataracts, Arrhythmia, Congestive Heart Failure, Date Acquired: 12/06/2020 History: Hypertension, Type II Diabetes Weeks Of Treatment: 2 Clustered Wound: No Photos Wound Measurements Length: (cm) 5.4 % Re Width: (cm) 2.2 % Re Depth: (cm) 1 Tunn Area: (cm) 9.331 Und Volume: (cm) 9.331 S En Ma duction in Area: -15.8% duction in Volume: -44.7% eling: No ermining: Yes tarting Position (o'clock): 4 ding Position (o'clock): 6 ximum Distance: (cm) 1.4 Wound Description Classification: Grade 3 Foul Exudate Amount: Large Slou Exudate Type: Serosanguineous Exudate Color: red, brown Odor After Cleansing: No gh/Fibrino Yes Wound Bed Granulation Amount: Small (1-33%) Exposed Structure Granulation Quality: Red Fascia Exposed: No Necrotic Amount: Large (67-100%) Fat Layer (Subcutaneous Tissue) Exposed: Yes Necrotic Quality: Eschar, Adherent Slough Tendon Exposed: No Muscle Exposed: No Joint Exposed: No Bone Exposed: No Treatment Notes Wound #1 (Malleolus) Wound Laterality: Right, Lateral Cleanser [place] Ancillary Kit - 15 Day Supply [facility], Leane Para ([id number]) Discharge Instruction: Use supplies as instructed; Kit contains: (15) Saline Bullets; (15) 3x3 Gauze; 15 pr Gloves Wound Cleanser Discharge Instruction:  Wash your hands with soap and water. Remove old dressing, discard into plastic bag and place into trash. Cleanse the wound with Wound Cleanser prior to applying a clean dressing using gauze sponges, not tissues or cotton balls. Do not scrub or use excessive force. Pat dry using gauze sponges, not tissue or cotton balls. Peri-Wound Care Topical Primary Dressing Silvercel 4 1/4x 4 1/4 (in/in) Discharge Instruction: Apply Silvercel 4 1/4x 4 1/4 (in/in) as instructed Secondary Dressing ABD Pad 5x9 (in/in) Discharge Instruction: Cover with ABD pad [place] Discharge Instruction: Apply Conforming Stretch Guaze Bandage as directed Secured With [place] Surgical Tape, 2x2 (in/yd) Compression Wrap Compression Stockings Add-Ons Electronic Signature(s) Signed: 01/17/2021 4:33:20 PM By: Donnamarie Poag Entered By: Donnamarie Poag on 01/17/2021 11:43:11 [name] [name] ([id number]) -------------------------------------------------------------------------------- [place] Details Patient Name: [name] [name] Date of Service: 01/17/2021 11:30 AM Medical Record Number: [id number] Patient Account Number: [id number] Date of Birth/Sex: 10-Dec-1940 (80 y.o. M) Treating RN: Donnamarie Poag Primary Care Kosisochukwu Burningham: Alanson Aly Other Clinician: Referring Lamyra Malcolm: Alanson Aly Treating Welborn Keena/Extender: Tito Dine in Treatment: 2 Vital Signs Time Taken: 11:37 Temperature (F): 97.9 Height (in): 72 Pulse (bpm): 91 Weight (lbs): 178 Respiratory Rate (breaths/min): 16 Body Mass Index (BMI): 24.1 Blood Pressure (mmHg): 86/61 Reference Range: 80 - 120 mg / dl Notes denies dizziness; stated he didn't drink anything but one cup of juice, water given at office Electronic Signature(s) Signed: 01/17/2021 4:33:20 PM By: Donnamarie Poag Entered ByDonnamarie Poag on 01/17/2021 11:39:09

## 2021-01-18 DIAGNOSIS — S91301D Unspecified open wound, right foot, subsequent encounter: Secondary | ICD-10-CM | POA: Diagnosis not present

## 2021-01-24 ENCOUNTER — Other Ambulatory Visit: Payer: Self-pay

## 2021-01-24 ENCOUNTER — Encounter: Payer: Medicare HMO | Attending: Physician Assistant | Admitting: Physician Assistant

## 2021-01-24 DIAGNOSIS — E11622 Type 2 diabetes mellitus with other skin ulcer: Secondary | ICD-10-CM | POA: Diagnosis not present

## 2021-01-24 DIAGNOSIS — E1151 Type 2 diabetes mellitus with diabetic peripheral angiopathy without gangrene: Secondary | ICD-10-CM | POA: Insufficient documentation

## 2021-01-24 DIAGNOSIS — L97312 Non-pressure chronic ulcer of right ankle with fat layer exposed: Secondary | ICD-10-CM | POA: Insufficient documentation

## 2021-01-24 DIAGNOSIS — I480 Paroxysmal atrial fibrillation: Secondary | ICD-10-CM | POA: Diagnosis not present

## 2021-01-24 DIAGNOSIS — L97318 Non-pressure chronic ulcer of right ankle with other specified severity: Secondary | ICD-10-CM | POA: Diagnosis not present

## 2021-01-24 DIAGNOSIS — I110 Hypertensive heart disease with heart failure: Secondary | ICD-10-CM | POA: Insufficient documentation

## 2021-01-24 DIAGNOSIS — I5042 Chronic combined systolic (congestive) and diastolic (congestive) heart failure: Secondary | ICD-10-CM | POA: Diagnosis not present

## 2021-01-24 NOTE — Progress Notes (Signed)
ZUHAIR, LARICCIA ([id number]) Visit Report for 01/24/2021 Arrival Information Details Patient Name: [name] [name], [name] [name] Date of Service: 01/24/2021 11:30 AM Medical Record Number: [id number] Patient Account Number: [id number] Date of Birth/Sex: 10/11/1940 (80 y.o. M) Treating RN: Donnamarie Poag Primary Care Jacorey Donaway: Alanson Aly Other Clinician: Referring Alvaretta Eisenberger: Alanson Aly Treating Jann Milkovich/Extender: Skipper Cliche in Treatment: 3 Visit Information History Since Last Visit Added or deleted any medications: No Patient Arrived: [name] [name] Had a fall or experienced change in No Arrival Time: 11:48 activities of daily living that may affect Accompanied By: self risk of falls: Transfer Assistance: Manual Hospitalized since last visit: No Patient Identification Verified: Yes Has Dressing in Place as Prescribed: Yes Secondary Verification Process Completed: Yes Pain Present Now: No Patient Requires Transmission-Based No Precautions: Patient Has Alerts: Yes Patient Alerts: Patient on Blood [place] Diabetic Electronic Signature(s) Signed: 01/24/2021 4:16:00 PM By: Donnamarie Poag Entered By: Donnamarie Poag on 01/24/2021 11:51:00 [name] [name] ([id number]) -------------------------------------------------------------------------------- Clinic Level of Care Assessment Details Patient Name: [name] [name] Date of Service: 01/24/2021 11:30 AM Medical Record Number: [id number] Patient Account Number: [id number] Date of Birth/Sex: 11-10-1940 (80 y.o. M) Treating RN: Donnamarie Poag Primary Care Druanne Bosques: Alanson Aly Other Clinician: Referring Lameeka Schleifer: Alanson Aly Treating Jaimi Belle/Extender: Skipper Cliche in Treatment: 3 Clinic Level of Care Assessment Items TOOL 1 Quantity Score []  - Use when EandM and Procedure is performed on INITIAL visit 0 ASSESSMENTS - Nursing Assessment / Reassessment []  - General Physical Exam (combine w/ comprehensive assessment (listed just below) when  performed on new 0 pt. evals) []  - 0 Comprehensive Assessment (HX, ROS, Risk Assessments, Wounds Hx, etc.) ASSESSMENTS - Wound and Skin Assessment / Reassessment []  - Dermatologic / Skin Assessment (not related to wound area) 0 ASSESSMENTS - Ostomy and/or Continence Assessment and Care []  - Incontinence Assessment and Management 0 []  - 0 Ostomy Care Assessment and Management (repouching, etc.) PROCESS - Coordination of Care []  - Simple Patient / Family Education for ongoing care 0 []  - 0 Complex (extensive) Patient / Family Education for ongoing care []  - 0 Staff obtains Programmer, systems, Records, Test Results / Process Orders []  - 0 Staff telephones HHA, Nursing Homes / Clarify orders / etc []  - 0 Routine Transfer to another Facility (non-emergent condition) []  - 0 Routine Hospital Admission (non-emergent condition) []  - 0 New Admissions / Biomedical engineer / Ordering NPWT, Apligraf, etc. []  - 0 Emergency Hospital Admission (emergent condition) PROCESS - Special Needs []  - Pediatric / Minor Patient Management 0 []  - 0 Isolation Patient Management []  - 0 Hearing / Language / Visual special needs []  - 0 Assessment of Community assistance (transportation, D/C planning, etc.) []  - 0 Additional assistance / Altered mentation []  - 0 Support Surface(s) Assessment (bed, cushion, seat, etc.) INTERVENTIONS - Miscellaneous []  - External ear exam 0 []  - 0 Patient Transfer (multiple staff / Civil Service fast streamer / Similar devices) []  - 0 Simple Staple / Suture removal (25 or less) []  - 0 Complex Staple / Suture removal (26 or more) []  - 0 Hypo/Hyperglycemic Management (do not check if billed separately) []  - 0 Ankle / Brachial Index (ABI) - do not check if billed separately Has the patient been seen at the hospital within the last three years: Yes Total Score: 0 Level Of Care: ____ [name] [name] ([id number]) Electronic Signature(s) Signed: 01/24/2021 4:16:00 PM By: Donnamarie Poag Entered  By: Donnamarie Poag on 01/24/2021 12:05:36 [name] [name] ([id number]) -------------------------------------------------------------------------------- Encounter Discharge Information Details Patient Name: [name] [name] Date of Service: 01/24/2021 11:30 AM  Medical Record Number: [id number] Patient Account Number: [id number] Date of Birth/Sex: 1941/04/06 (80 y.o. M) Treating RN: Donnamarie Poag Primary Care Jodeci Roarty: Alanson Aly Other Clinician: Referring Edom Schmuhl: Alanson Aly Treating Aryaan Persichetti/Extender: Skipper Cliche in Treatment: 3 Encounter Discharge Information Items Post Procedure Vitals Discharge Condition: Stable Temperature (F): 98.1 Ambulatory Status: Cane Pulse (bpm): 69 Discharge Destination: Home Respiratory Rate (breaths/min): 16 Transportation: Private Auto Blood Pressure (mmHg): 167/88 Accompanied By: son Schedule Follow-up Appointment: Yes Clinical Summary of Care: Electronic Signature(s) Signed: 01/24/2021 4:16:00 PM By: Donnamarie Poag Entered By: Donnamarie Poag on 01/24/2021 12:14:23 [name] [name] ([id number]) -------------------------------------------------------------------------------- Lower Extremity Assessment Details Patient Name: [name] [name] Date of Service: 01/24/2021 11:30 AM Medical Record Number: [id number] Patient Account Number: [id number] Date of Birth/Sex: 10/01/1940 (80 y.o. M) Treating RN: Donnamarie Poag Primary Care Tashima Scarpulla: Alanson Aly Other Clinician: Referring Deshay Blumenfeld: Alanson Aly Treating Shawnette Augello/Extender: Skipper Cliche in Treatment: 3 Edema Assessment Assessed: [Left: No] [Right: Yes] Edema: [Left: N] [Right: o] Calf Left: Right: Point of Measurement: 38 cm From Medial Instep 33 cm Ankle Left: Right: Point of Measurement: 12 cm From Medial Instep 23 cm Knee To Floor Left: Right: From Medial Instep 47 cm Electronic Signature(s) Signed: 01/24/2021 4:16:00 PM By: Donnamarie Poag Entered By: Donnamarie Poag on 01/24/2021  11:59:10 [name] [name] ([id number]) -------------------------------------------------------------------------------- Multi Wound Chart Details Patient Name: [name] [name] Date of Service: 01/24/2021 11:30 AM Medical Record Number: [id number] Patient Account Number: [id number] Date of Birth/Sex: 1940/11/13 (80 y.o. M) Treating RN: Donnamarie Poag Primary Care Giabella Duhart: Alanson Aly Other Clinician: Referring Averleigh Savary: Alanson Aly Treating Camryn Lampson/Extender: Skipper Cliche in Treatment: 3 Vital Signs Height(in): 72 Pulse(bpm): 46 Weight(lbs): 178 Blood Pressure(mmHg): 167/88 Body Mass Index(BMI): 24 Temperature(F): 98.2 Respiratory Rate(breaths/min): 16 Photos: [N/A:N/A] Wound Location: Right, Lateral Malleolus N/A N/A Wounding Event: Gradually Appeared N/A N/A Primary Etiology: Diabetic Wound/Ulcer of the Lower N/A N/A Extremity Comorbid History: Cataracts, Arrhythmia, Congestive N/A N/A Heart Failure, Hypertension, Type II Diabetes Date Acquired: 12/06/2020 N/A N/A Weeks of Treatment: 3 N/A N/A Wound Status: Open N/A N/A Measurements L x W x D (cm) 5.4x2.2x1 N/A N/A Area (cm) : 9.331 N/A N/A Volume (cm) : 9.331 N/A N/A % Reduction in Area: -15.80% N/A N/A % Reduction in Volume: -44.70% N/A N/A Starting Position 1 (o'clock): 3 Ending Position 1 (o'clock): 5 Maximum Distance 1 (cm): 0.9 Undermining: Yes N/A N/A Classification: Grade 3 N/A N/A Exudate Amount: Large N/A N/A Exudate Type: Serosanguineous N/A N/A Exudate Color: red, brown N/A N/A Granulation Amount: Small (1-33%) N/A N/A Granulation Quality: Red N/A N/A Necrotic Amount: Large (67-100%) N/A N/A Necrotic Tissue: Eschar, Adherent Slough N/A N/A Exposed Structures: Fat Layer (Subcutaneous Tissue): N/A N/A Yes Tendon: Yes Fascia: No Muscle: No Joint: No Bone: No Treatment Notes Electronic Signature(s) Signed: 01/24/2021 4:16:00 PM By: Sheron Nightingale, Leane Para ([id number]) Entered By: Donnamarie Poag on 01/24/2021 11:59:44 [name] [name] ([id number]) -------------------------------------------------------------------------------- Multi-Disciplinary Care Plan Details Patient Name: [name] [name] Date of Service: 01/24/2021 11:30 AM Medical Record Number: [id number] Patient Account Number: [id number] Date of Birth/Sex: [birth year] (80 y.o. M) Treating RN: Donnamarie Poag Primary Care Verna Desrocher: Alanson Aly Other Clinician: Referring Junior Huezo: Alanson Aly Treating Shayne Diguglielmo/Extender: Skipper Cliche in Treatment: 3 Active Inactive Wound/Skin Impairment Nursing Diagnoses: Impaired tissue integrity Knowledge deficit related to smoking impact on wound healing Knowledge deficit related to ulceration/compromised skin integrity Goals: Patient/caregiver will verbalize understanding of skin care regimen Date Initiated: 01/03/2021 Date Inactivated: 01/17/2021 Target Resolution Date: 01/13/2021 Goal Status: Met Ulcer/skin breakdown will have a volume reduction  of 30% by week 4 Date Initiated: 01/03/2021 Target Resolution Date: 02/02/2021 Goal Status: Active Ulcer/skin breakdown will have a volume reduction of 50% by week 8 Date Initiated: 01/03/2021 Target Resolution Date: 03/05/2021 Goal Status: Active Ulcer/skin breakdown will have a volume reduction of 80% by week 12 Date Initiated: 01/03/2021 Target Resolution Date: 04/04/2021 Goal Status: Active Ulcer/skin breakdown will heal within 14 weeks Date Initiated: 01/03/2021 Target Resolution Date: 04/18/2021 Goal Status: Active Interventions: Assess patient/caregiver ability to obtain necessary supplies Assess patient/caregiver ability to perform ulcer/skin care regimen upon admission and as needed Assess ulceration(s) every visit Notes: Electronic Signature(s) Signed: 01/24/2021 4:16:00 PM By: Donnamarie Poag Entered By: Donnamarie Poag on 01/24/2021 11:59:33 [name] [name]  ([id number]) -------------------------------------------------------------------------------- Pain Assessment Details Patient Name: [name] [name] Date of Service: 01/24/2021 11:30 AM Medical Record Number: [id number] Patient Account Number: [id number] Date of Birth/Sex: 02-04-1941 (80 y.o. M) Treating RN: Donnamarie Poag Primary Care Elaya Droege: Alanson Aly Other Clinician: Referring Neytiri Asche: Alanson Aly Treating Dontaye Hur/Extender: Skipper Cliche in Treatment: 3 Active Problems Location of Pain Severity and Description of Pain Patient Has Paino No Site Locations Rate the pain. Current Pain Level: 0 Pain Management and Medication Current Pain Management: Electronic Signature(s) Signed: 01/24/2021 4:16:00 PM By: Donnamarie Poag Entered By: Donnamarie Poag on 01/24/2021 11:53:14 [name] [name] ([id number]) -------------------------------------------------------------------------------- Patient/Caregiver Education Details Patient Name: [name] [name] Date of Service: 01/24/2021 11:30 AM Medical Record Number: [id number] Patient Account Number: [id number] Date of Birth/Gender: 14-Apr-1941 (80 y.o. M) Treating RN: Donnamarie Poag Primary Care Physician: Alanson Aly Other Clinician: Referring Physician: Alanson Aly Treating Physician/Extender: Skipper Cliche in Treatment: 3 Education Assessment Education Provided To: Patient Education Topics Provided Basic Hygiene: Wound Debridement: Wound/Skin Impairment: Electronic Signature(s) Signed: 01/24/2021 4:16:00 PM By: Donnamarie Poag Entered By: Donnamarie Poag on 01/24/2021 12:06:19 [name] [name] ([id number]) -------------------------------------------------------------------------------- Wound Assessment Details Patient Name: [name] [name] Date of Service: 01/24/2021 11:30 AM Medical Record Number: [id number] Patient Account Number: [id number] Date of Birth/Sex: 15-Apr-1941 (80 y.o. M) Treating RN: Donnamarie Poag Primary Care Chana Lindstrom: Alanson Aly Other Clinician: Referring Nolyn Swab: Alanson Aly Treating Sumiye Hirth/Extender: Skipper Cliche in Treatment: 3 Wound Status Wound Number: 1 Primary Diabetic Wound/Ulcer of the Lower Extremity Etiology: Wound Location: Right, Lateral Malleolus Wound Status: Open Wounding Event: Gradually Appeared Comorbid Cataracts, Arrhythmia, Congestive Heart Failure, Date Acquired: 12/06/2020 History: Hypertension, Type II Diabetes Weeks Of Treatment: 3 Clustered Wound: No Photos Wound Measurements Length: (cm) 5.4 Width: (cm) 2.2 Depth: (cm) 1 Area: (cm) 9.331 Volume: (cm) 9.331 % Reduction in Area: -15.8% % Reduction in Volume: -44.7% Undermining: Yes Starting Position (o'clock): 3 Ending Position (o'clock): 5 Maximum Distance: (cm) 0.9 Wound Description Classification: Grade 3 Exudate Amount: Large Exudate Type: Serosanguineous Exudate Color: red, brown Foul Odor After Cleansing: No Slough/Fibrino Yes Wound Bed Granulation Amount: Small (1-33%) Exposed Structure Granulation Quality: Red Fascia Exposed: No Necrotic Amount: Large (67-100%) Fat Layer (Subcutaneous Tissue) Exposed: Yes Necrotic Quality: Eschar, Adherent Slough Tendon Exposed: Yes Muscle Exposed: No Joint Exposed: No Bone Exposed: No Treatment Notes Wound #1 (Malleolus) Wound Laterality: Right, Lateral Cleanser Dakin 16 (oz) 0.25 Discharge Instruction: Use as directed. DEIONDRE, HARROWER ([id number]) Peri-Wound Care Topical Primary Dressing Silvercel 4 1/4x 4 1/4 (in/in) Discharge Instruction: Apply Silvercel 4 1/4x 4 1/4 (in/in) as instructed Secondary Dressing ABD Pad 5x9 (in/in) Discharge Instruction: Cover with ABD pad Virgie Discharge Instruction: Apply Conforming Stretch Guaze Bandage as directed Secured With 64M [place] [facility] Tape, 2x2 (in/yd) Compression Wrap Compression Stockings Environmental education officer) Signed:  01/24/2021 4:16:00 PM By:  Donnamarie Poag Entered By: Donnamarie Poag on 01/24/2021 11:55:33 [name] [name] ([id number]) -------------------------------------------------------------------------------- Temperance Details Patient Name: [name] [name] Date of Service: 01/24/2021 11:30 AM Medical Record Number: [id number] Patient Account Number: [id number] Date of Birth/Sex: 08-15-1940 (80 y.o. M) Treating RN: Donnamarie Poag Primary Care Zohair Epp: Alanson Aly Other Clinician: Referring Emmily Pellegrin: Alanson Aly Treating Tashiba Timoney/Extender: Skipper Cliche in Treatment: 3 Vital Signs Time Taken: 11:50 Temperature (F): 98.2 Height (in): 72 Pulse (bpm): 69 Weight (lbs): 178 Respiratory Rate (breaths/min): 16 Body Mass Index (BMI): 24.1 Blood Pressure (mmHg): 167/88 Reference Range: 80 - 120 mg / dl Electronic Signature(s) Signed: 01/24/2021 4:16:00 PM By: Donnamarie Poag Entered ByDonnamarie Poag on 01/24/2021 11:53:07

## 2021-01-24 NOTE — Progress Notes (Addendum)
JERIC, PAWELSKI ([id number]) Visit Report for 01/24/2021 Chief Complaint Document Details Patient Name: [name] [name], [name] [name] Date of Service: 01/24/2021 11:30 AM Medical Record Number: [id number] Patient Account Number: [id number] Date of Birth/Sex: 04-07-1941 (80 y.o. Male) Treating RN: Hansel Feinstein Primary Care Provider: Lorenso Quarry Other Clinician: Referring Provider: Cheryll Dessert Treating Provider/Extender: Rowan Blase in Treatment: 3 Information Obtained from: Patient Chief Complaint Right ankle ulcer Electronic Signature(s) Signed: 01/24/2021 11:57:37 AM By: Lenda Kelp PA-C Entered By: Lenda Kelp on 01/24/2021 11:57:37 [name] [name] ([id number]) -------------------------------------------------------------------------------- Debridement Details Patient Name: [name] [name] Date of Service: 01/24/2021 11:30 AM Medical Record Number: [id number] Patient Account Number: [id number] Date of Birth/Sex: Oct 30, 1940 (80 y.o. Male) Treating RN: Hansel Feinstein Primary Care Provider: Lorenso Quarry Other Clinician: Referring Provider: Cheryll Dessert Treating Provider/Extender: Rowan Blase in Treatment: 3 Debridement Performed for Wound #1 Right,Lateral Malleolus Assessment: Performed By: Physician Nelida Meuse., PA-C Debridement Type: Debridement Severity of Tissue Pre Debridement: Fat layer exposed Level of Consciousness (Pre- Awake and Alert procedure): Pre-procedure Verification/Time Out Yes - 12:00 Taken: Start Time: 12:00 Pain Control: Lidocaine Total Area Debrided (L x W): 2 (cm) x 2 (cm) = 4 (cm) Tissue and other material Non-Viable, Eschar, Slough, Slough debrided: Level: Non-Viable Tissue Debridement Description: Selective/Open Wound Instrument: Curette Bleeding: None Response to Treatment: Procedure was tolerated well Level of Consciousness (Post- Awake and Alert procedure): Post Debridement Measurements of Total Wound Length: (cm) 5.4 Width: (cm)  2.2 Depth: (cm) 1 Volume: (cm) 9.331 Character of Wound/Ulcer Post Debridement: Requires Further Debridement Severity of Tissue Post Debridement: Other severity specified Post Procedure Diagnosis Same as Pre-procedure Electronic Signature(s) Signed: 01/24/2021 4:16:00 PM By: Hansel Feinstein Signed: 01/24/2021 5:28:09 PM By: Lenda Kelp PA-C Entered By: Hansel Feinstein on 01/24/2021 12:02:58 [name] [name] ([id number]) -------------------------------------------------------------------------------- HPI Details Patient Name: [name] [name] Date of Service: 01/24/2021 11:30 AM Medical Record Number: [id number] Patient Account Number: [id number] Date of Birth/Sex: 1940-08-20 (80 y.o. Male) Treating RN: Hansel Feinstein Primary Care Provider: Lorenso Quarry Other Clinician: Referring Provider: Cheryll Dessert Treating Provider/Extender: Rowan Blase in Treatment: 3 History of Present Illness HPI Description: 01/03/2021 upon evaluation today patient appears to be doing somewhat poorly in regard to wound on his right lateral ankle. This is quite significant based on what I see today. He tells me this is a site that back in 2006 he actually had a spider bite at this location that ended up requiring a fairly significant surgery to where he tells me that "the bone and there looks like a log lying in a creek". Nonetheless this sounds like it was a fairly significant wound at that time. Nonetheless its been okay for a number of years until more recently where unfortunately on around 12/06/2020 this began to breakdown and development what we are seeing currently. There is a significant amount of necrotic tissue in the central portion of the wound. More importantly and concerning [name] his ABI is very low at 0.45 I am concerned that this is an arterial issue obviously he is diabetic and that could be playing a role as well but nonetheless I do believe we need to focus and see what we can do about getting him to  vascular ASAP. The patient does have a history of diabetes mellitus type 2, hypertension, atrial fibrillation, and congestive heart failure. 01/10/2021 upon evaluation today patient appears to be doing well with regard to his wound. He has been tolerating the dressing changes without complication. The good news is the Levaquin does  seem to be helping with overall clearing up the surface of the wound as well as the infection. We did do repeat fluorescence imaging today which showed that he had much less of the red fluorescence as compared to last week. This is great news. Still this was quite significant and still were not completely out of the water as far as bacterial load is concerned. 01/17/2021; patient with a large necrotic wound over the right lateral malleolus. Arterial studies from 9/22 showed an ABI of 0.46 biphasic waveforms at the PTA on the right. His TBI is 0.24 with a toe pressure of 32 mmHg. This is indicative of severe PAD and possibly limb threatening ischemia. The patient does not describe a lot of pain except at night. He is a minimal ambulator. He is using been using Dakin's half-strength 01/24/2021 upon evaluation today patient's ankle ulcer actually starting to loosen up as far as the eschar is concerned. With that being said the patient still is having quite a bit of issues with a significant wound here. There does not appear to be any pain that is improved with the antibiotic treatment which is good news he is done with the antibiotics at this point. With that being said initially I had thought that we had sent him for an x-ray but he tells me that he did not get that form. Nonetheless I am going to send him for an x-ray of the ankle currently. Fortunately I think that the infection is significantly better by 1 to make sure there is no evidence of osteomyelitis or something more significant that would indicate that we are going to have to address this a little bit more  aggressively. Patient voiced understanding. Electronic Signature(s) Signed: 01/24/2021 1:24:09 PM By: Lenda Kelp PA-C Entered By: Lenda Kelp on 01/24/2021 13:24:09 [name] [name] ([id number]) -------------------------------------------------------------------------------- Physical Exam Details Patient Name: [name] [name] Date of Service: 01/24/2021 11:30 AM Medical Record Number: [id number] Patient Account Number: [id number] Date of Birth/Sex: 03/20/1941 (80 y.o. Male) Treating RN: Hansel Feinstein Primary Care Provider: Lorenso Quarry Other Clinician: Referring Provider: Cheryll Dessert Treating Provider/Extender: Rowan Blase in Treatment: 3 Constitutional Well-nourished and well-hydrated in no acute distress. Respiratory normal breathing without difficulty. Psychiatric this patient is able to make decisions and demonstrates good insight into disease process. Alert and Oriented x 3. pleasant and cooperative. Notes Upon inspection patient's wound bed actually showed signs of good granulation epithelization at this point. Fortunately there does not appear to be any signs of active infection systemically which is great news and overall very pleased with where things stand today. No fevers, chills, nausea, vomiting, or diarrhea. Electronic Signature(s) Signed: 01/24/2021 1:24:25 PM By: Lenda Kelp PA-C Entered By: Lenda Kelp on 01/24/2021 13:24:25 [name] [name] ([id number]) -------------------------------------------------------------------------------- Physician Orders Details Patient Name: [name] [name] Date of Service: 01/24/2021 11:30 AM Medical Record Number: [id number] Patient Account Number: [id number] Date of Birth/Sex: [birth year] (80 y.o. Male) Treating RN: Hansel Feinstein Primary Care Provider: Lorenso Quarry Other Clinician: Referring Provider: Cheryll Dessert Treating Provider/Extender: Rowan Blase in Treatment: 3 Verbal / Phone Orders: No Diagnosis  Coding ICD-10 Coding Code Description E11.622 Type 2 diabetes mellitus with other skin 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 I10 Essential (primary) hypertension I48.0 Paroxysmal atrial fibrillation I50.42 Chronic combined systolic (congestive) and diastolic (congestive) heart failure Follow-up Appointments o Return Appointment in 1 week. Bathing/ Shower/ Hygiene o May shower with wound dressing protected with water repellent cover or cast  protector. o No tub bath. Edema Control - Lymphedema / Segmental Compressive Device / Other o Elevate leg(s) parallel to the floor when sitting. o DO YOUR BEST to sleep in the bed at night. DO NOT sleep in your recliner. Long hours of sitting in a recliner leads to swelling of the legs and/or potential wounds on your backside. Off-Loading o Other: - keep pressure off of the wound/right ankle to promote healing Wound Treatment Wound #1 - Malleolus Wound Laterality: Right, Lateral Cleanser: Dakin 16 (oz) 0.25 1 x Per Day/30 Days Discharge Instructions: Use as directed. Primary Dressing: Silvercel 4 1/4x 4 1/4 (in/in) 1 x Per Day/30 Days Discharge Instructions: Apply Silvercel 4 1/4x 4 1/4 (in/in) as instructed Secondary Dressing: ABD Pad 5x9 (in/in) (Generic) 1 x Per Day/30 Days Discharge Instructions: Cover with ABD pad Secondary Dressing: Conforming Guaze Roll-Large (Generic) 1 x Per Day/30 Days Discharge Instructions: Apply Conforming Stretch Guaze Bandage as directed Secured With: 73M Medipore H Soft Cloth Surgical Tape, 2x2 (in/yd) (Generic) 1 x Per Day/30 Days Radiology o X-ray, foot - (ICD10 E11.622 - Type 2 diabetes mellitus with other skin ulcer) o MRI without Contrast right ankle - (ICD10 E11.622 - Type 2 diabetes mellitus with other skin ulcer) Electronic Signature(s) Signed: 02/15/2021 4:19:00 PM By: Yevonne Pax RN Signed: 02/21/2021 3:59:15 PM By: Lenda Kelp PA-C Previous  Signature: 01/24/2021 4:16:00 PM Version By: Hansel Feinstein Previous Signature: 01/24/2021 5:28:09 PM Version By: Lenda Kelp PA-C Entered By: Yevonne Pax on 02/10/2021 16:24:15 [name] [name] ([id number]) Cephus Shelling, Jerolyn Shin ([id number]) -------------------------------------------------------------------------------- Problem List Details Patient Name: [name] [name] Date of Service: 01/24/2021 11:30 AM Medical Record Number: [id number] Patient Account Number: [id number] Date of Birth/Sex: 1941-03-20 (80 y.o. Male) Treating RN: Hansel Feinstein Primary Care Provider: Lorenso Quarry Other Clinician: Referring Provider: Cheryll Dessert Treating Provider/Extender: Rowan Blase in Treatment: 3 Active Problems ICD-10 Encounter Code Description Active Date MDM Diagnosis E11.622 Type 2 diabetes mellitus with other skin ulcer 01/03/2021 No Yes L97.312 Non-pressure chronic ulcer of right ankle with fat layer exposed 01/03/2021 No Yes I10 Essential (primary) hypertension 01/03/2021 No Yes I48.0 Paroxysmal atrial fibrillation 01/03/2021 No Yes I50.42 Chronic combined systolic (congestive) and diastolic (congestive) heart 01/03/2021 No Yes failure Inactive Problems Resolved Problems Electronic Signature(s) Signed: 01/24/2021 11:57:29 AM By: Lenda Kelp PA-C Entered By: Lenda Kelp on 01/24/2021 11:57:29 [name] [name] ([id number]) -------------------------------------------------------------------------------- Progress Note Details Patient Name: [name] [name] Date of Service: 01/24/2021 11:30 AM Medical Record Number: [id number] Patient Account Number: [id number] Date of Birth/Sex: Mar 04, 1941 (80 y.o. Male) Treating RN: Hansel Feinstein Primary Care Provider: Lorenso Quarry Other Clinician: Referring Provider: Cheryll Dessert Treating Provider/Extender: Rowan Blase in Treatment: 3 Subjective Chief Complaint Information obtained from Patient Right ankle ulcer History of Present Illness  (HPI) 01/03/2021 upon evaluation today patient appears to be doing somewhat poorly in regard to wound on his right lateral ankle. This is quite significant based on what I see today. He tells me this is a site that back in 2006 he actually had a spider bite at this location that ended up requiring a fairly significant surgery to where he tells me that "the bone and there looks like a log lying in a creek". Nonetheless this sounds like it was a fairly significant wound at that time. Nonetheless its been okay for a number of years until more recently where unfortunately on around 12/06/2020 this began to breakdown and development what we are seeing currently. There is a significant amount of necrotic tissue in  the central portion of the wound. More importantly and concerning [name] his ABI is very low at 0.45 I am concerned that this is an arterial issue obviously he is diabetic and that could be playing a role as well but nonetheless I do believe we need to focus and see what we can do about getting him to vascular ASAP. The patient does have a history of diabetes mellitus type 2, hypertension, atrial fibrillation, and congestive heart failure. 01/10/2021 upon evaluation today patient appears to be doing well with regard to his wound. He has been tolerating the dressing changes without complication. The good news is the Levaquin does seem to be helping with overall clearing up the surface of the wound as well as the infection. We did do repeat fluorescence imaging today which showed that he had much less of the red fluorescence as compared to last week. This is great news. Still this was quite significant and still were not completely out of the water as far as bacterial load is concerned. 01/17/2021; patient with a large necrotic wound over the right lateral malleolus. Arterial studies from 9/22 showed an ABI of 0.46 biphasic waveforms at the PTA on the right. His TBI is 0.24 with a toe pressure of 32 mmHg.  This is indicative of severe PAD and possibly limb threatening ischemia. The patient does not describe a lot of pain except at night. He is a minimal ambulator. He is using been using Dakin's half-strength 01/24/2021 upon evaluation today patient's ankle ulcer actually starting to loosen up as far as the eschar is concerned. With that being said the patient still is having quite a bit of issues with a significant wound here. There does not appear to be any pain that is improved with the antibiotic treatment which is good news he is done with the antibiotics at this point. With that being said initially I had thought that we had sent him for an x-ray but he tells me that he did not get that form. Nonetheless I am going to send him for an x-ray of the ankle currently. Fortunately I think that the infection is significantly better by 1 to make sure there is no evidence of osteomyelitis or something more significant that would indicate that we are going to have to address this a little bit more aggressively. Patient voiced understanding. Objective Constitutional Well-nourished and well-hydrated in no acute distress. Vitals Time Taken: 11:50 AM, Height: 72 in, Weight: 178 lbs, BMI: 24.1, Temperature: 98.2 F, Pulse: 69 bpm, Respiratory Rate: 16 breaths/min, Blood Pressure: 167/88 mmHg. Respiratory normal breathing without difficulty. Psychiatric this patient is able to make decisions and demonstrates good insight into disease process. Alert and Oriented x 3. pleasant and cooperative. General Notes: Upon inspection patient's wound bed actually showed signs of good granulation epithelization at this point. Fortunately there does not appear to be any signs of active infection systemically which is great news and overall very pleased with where things stand today. No fevers, chills, nausea, vomiting, or diarrhea. I did perform a debridement of the extremely loose necrotic tissue which was hanging from the  lateral portion of the ankle. This was with scissors and forceps and was done so very carefully so as not to cause any damage or overall worsening with regard to the wound considering his extreme poor arterial flow. Integumentary (Hair, Skin) Wound #1 status is Open. Original cause of wound was Gradually Appeared. The date acquired was: 12/06/2020. The wound has been in treatment 3 weeks.  The wound is located on the Right,Lateral Malleolus. The wound measures 5.4cm length x 2.2cm width x 1cm depth; 9.331cm^2 area Kadel, Davy ([id number]) and 9.331cm^3 volume. There is tendon and Fat Layer (Subcutaneous Tissue) exposed. There is undermining starting at 3:00 and ending at 5:00 with a maximum distance of 0.9cm. There is a large amount of serosanguineous drainage noted. There is small (1-33%) red granulation within the wound bed. There is a large (67-100%) amount of necrotic tissue within the wound bed including Eschar and Adherent Slough. Assessment Active Problems ICD-10 Type 2 diabetes mellitus with other skin ulcer Non-pressure chronic ulcer of right ankle with fat layer exposed Essential (primary) hypertension Paroxysmal atrial fibrillation Chronic combined systolic (congestive) and diastolic (congestive) heart failure Procedures Wound #1 Pre-procedure diagnosis of Wound #1 is a Diabetic Wound/Ulcer of the Lower Extremity located on the Right,Lateral Malleolus .Severity of Tissue Pre Debridement is: Fat layer exposed. There was a Selective/Open Wound Non-Viable Tissue Debridement with a total area of 4 sq cm performed by Nelida Meuse., PA-C. With the following instrument(s): Curette to remove Non-Viable tissue/material. Material removed includes Eschar and Slough and after achieving pain control using Lidocaine. A time out was conducted at 12:00, prior to the start of the procedure. There was no bleeding. The procedure was tolerated well. Post Debridement Measurements: 5.4cm length x 2.2cm  width x 1cm depth; 9.331cm^3 volume. Character of Wound/Ulcer Post Debridement requires further debridement. Severity of Tissue Post Debridement is: Other severity specified. Post procedure Diagnosis Wound #1: Same as Pre-Procedure Plan Follow-up Appointments: Return Appointment in 1 week. Bathing/ Shower/ Hygiene: May shower with wound dressing protected with water repellent cover or cast protector. No tub bath. Edema Control - Lymphedema / Segmental Compressive Device / Other: Elevate leg(s) parallel to the floor when sitting. DO YOUR BEST to sleep in the bed at night. DO NOT sleep in your recliner. Long hours of sitting in a recliner leads to swelling of the legs and/or potential wounds on your backside. Off-Loading: Other: - keep pressure off of the wound/right ankle to promote healing Radiology ordered were: X-ray, foot WOUND #1: - Malleolus Wound Laterality: Right, Lateral Cleanser: Dakin 16 (oz) 0.25 1 x Per Day/30 Days Discharge Instructions: Use as directed. Primary Dressing: Silvercel 4 1/4x 4 1/4 (in/in) 1 x Per Day/30 Days Discharge Instructions: Apply Silvercel 4 1/4x 4 1/4 (in/in) as instructed Secondary Dressing: ABD Pad 5x9 (in/in) (Generic) 1 x Per Day/30 Days Discharge Instructions: Cover with ABD pad Secondary Dressing: Conforming Guaze Roll-Large (Generic) 1 x Per Day/30 Days Discharge Instructions: Apply Conforming Stretch Guaze Bandage as directed Secured With: 36M Medipore H Soft Cloth Surgical Tape, 2x2 (in/yd) (Generic) 1 x Per Day/30 Days 1. Would recommend currently that we going continue with the wound care measures as before and the patient is in agreement with the plan. This includes the use of the silver alginate dressing which I think is helping to control some of the excessive drainage which is good news. 2. I am also can recommend that we have the patient continue with the ABD pad to cover followed by roll gauze to secure in place. 3. Also continue to  clean the area with Dakin's solution. 4. I am also can recommend that he go for an x-ray today I thought we already sent him for this but apparently that did not get done. Depending on the results of the x-ray and MRI may be warranted due to the depth of the wound. [place], Jerolyn Shin ([id number]) We will  see patient back for reevaluation in 1 week here in the clinic. If anything worsens or changes patient will contact our office for additional recommendations. Electronic Signature(s) Signed: 01/24/2021 1:26:13 PM By: Lenda Kelp PA-C Previous Signature: 01/24/2021 1:25:13 PM Version By: Lenda Kelp PA-C Entered By: Lenda Kelp on 01/24/2021 13:26:13 [name] [name] ([id number]) -------------------------------------------------------------------------------- SuperBill Details Patient Name: [name] [name] Date of Service: 01/24/2021 Medical Record Number: [id number] Patient Account Number: [id number] Date of Birth/Sex: 1940-05-20 (80 y.o. Male) Treating RN: Hansel Feinstein Primary Care Provider: Lorenso Quarry Other Clinician: Referring Provider: Cheryll Dessert Treating Provider/Extender: Rowan Blase in Treatment: 3 Diagnosis Coding ICD-10 Codes Code Description E11.622 Type 2 diabetes mellitus with other skin 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 I10 Essential (primary) hypertension I48.0 Paroxysmal atrial fibrillation I50.42 Chronic combined systolic (congestive) and diastolic (congestive) heart failure Facility Procedures CPT4 Code: [id number] Description: [phone] - DEBRIDE WOUND 1ST 20 SQ CM OR < Modifier: Quantity: 1 CPT4 Code: Description: ICD-10 Diagnosis Description L97.312 Non-pressure chronic ulcer of right ankle with fat layer exposed Modifier: Quantity: Physician Procedures CPT4 Code: [id number] Description: 99214 - WC PHYS LEVEL 4 - EST PT Modifier: 25 Quantity: 1 CPT4 Code: Description: ICD-10 Diagnosis Description E11.622  Type 2 diabetes mellitus with other skin 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 I10 Essential (primary) hypertension I48.0 Paroxysmal atrial fibrillation Modifier: Quantity: CPT4 Code: [id number] Description: 97597 - WC PHYS DEBR WO ANESTH 20 SQ CM Modifier: Quantity: 1 CPT4 Code: Description: ICD-10 Diagnosis Description L97.312 Non-pressure chronic ulcer of right ankle with fat layer exposed Modifier: Quantity: Electronic Signature(s) Signed: 01/24/2021 1:25:29 PM By: Lenda Kelp PA-C Entered By: Lenda Kelp on 01/24/2021 13:25:29

## 2021-01-25 ENCOUNTER — Other Ambulatory Visit: Payer: Self-pay | Admitting: Physician Assistant

## 2021-01-25 ENCOUNTER — Ambulatory Visit
Admission: RE | Admit: 2021-01-25 | Discharge: 2021-01-25 | Disposition: A | Discharge status: Home / Self Care | Payer: Medicare HMO | Attending: Physician Assistant | Admitting: Physician Assistant

## 2021-01-25 ENCOUNTER — Ambulatory Visit
Admission: RE | Admit: 2021-01-25 | Discharge: 2021-01-25 | Disposition: A | Discharge status: Home / Self Care | Payer: Medicare HMO | Source: Ambulatory Visit | Attending: Physician Assistant | Admitting: Physician Assistant

## 2021-01-25 DIAGNOSIS — R600 Localized edema: Secondary | ICD-10-CM | POA: Diagnosis not present

## 2021-01-25 DIAGNOSIS — S81801A Unspecified open wound, right lower leg, initial encounter: Secondary | ICD-10-CM | POA: Insufficient documentation

## 2021-01-31 ENCOUNTER — Other Ambulatory Visit: Payer: Self-pay | Admitting: Physician Assistant

## 2021-01-31 ENCOUNTER — Other Ambulatory Visit: Payer: Self-pay

## 2021-01-31 ENCOUNTER — Encounter: Payer: Medicare HMO | Admitting: Internal Medicine

## 2021-01-31 DIAGNOSIS — I5042 Chronic combined systolic (congestive) and diastolic (congestive) heart failure: Secondary | ICD-10-CM | POA: Diagnosis not present

## 2021-01-31 DIAGNOSIS — I480 Paroxysmal atrial fibrillation: Secondary | ICD-10-CM | POA: Diagnosis not present

## 2021-01-31 DIAGNOSIS — E11622 Type 2 diabetes mellitus with other skin ulcer: Secondary | ICD-10-CM | POA: Diagnosis not present

## 2021-01-31 DIAGNOSIS — L97316 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 without evidence of necrosis: Secondary | ICD-10-CM | POA: Diagnosis not present

## 2021-01-31 DIAGNOSIS — L97312 Non-pressure chronic ulcer of right ankle with fat layer exposed: Secondary | ICD-10-CM

## 2021-01-31 DIAGNOSIS — I110 Hypertensive heart disease with heart failure: Secondary | ICD-10-CM | POA: Diagnosis not present

## 2021-01-31 DIAGNOSIS — E1151 Type 2 diabetes mellitus with diabetic peripheral angiopathy without gangrene: Secondary | ICD-10-CM | POA: Diagnosis not present

## 2021-01-31 NOTE — Progress Notes (Signed)
GEDDY, BOYDSTUN ([id number]) Visit Report for 01/31/2021 Arrival Information Details Patient Name: [name] [name], [name] [name] Date of Service: 01/31/2021 11:30 AM Medical Record Number: [id number] Patient Account Number: [id number] Date of Birth/Sex: 10-Aug-1940 (80 y.o. M) Treating RN: Donnamarie Poag Primary Care Johnay Mano: Alanson Aly Other Clinician: Referring Ahamed Hofland: Alanson Aly Treating Luane Rochon/Extender: Tito Dine in Treatment: 4 Visit Information History Since Last Visit Added or deleted any medications: No Patient Arrived: [name] Had a fall or experienced change in No Arrival Time: 11:42 activities of daily living that may affect Accompanied By: self risk of falls: Transfer Assistance: None Hospitalized since last visit: No Patient Identification Verified: Yes Has Dressing in Place as Prescribed: Yes Secondary Verification Process Completed: Yes Pain Present Now: Yes Patient Requires Transmission-Based No Precautions: Patient Has Alerts: Yes Patient Alerts: Patient on Blood Thinner [place] Diabetic Electronic Signature(s) Signed: 01/31/2021 3:42:16 PM By: Donnamarie Poag Entered By: Donnamarie Poag on 01/31/2021 11:43:23 [name] ([id number]) -------------------------------------------------------------------------------- Clinic Level of Care Assessment Details Patient Name: [name] Date of Service: 01/31/2021 11:30 AM Medical Record Number: [id number] Patient Account Number: [id number] Date of Birth/Sex: 05-23-1940 (80 y.o. M) Treating RN: Donnamarie Poag Primary Care Jori Thrall: Alanson Aly Other Clinician: Referring Larkin Morelos: Alanson Aly Treating Hitoshi Werts/Extender: Tito Dine in Treatment: 4 Clinic Level of Care Assessment Items TOOL 4 Quantity Score []  - Use when only an EandM is performed on FOLLOW-UP visit 0 ASSESSMENTS - Nursing Assessment / Reassessment []  - Reassessment of Co-morbidities (includes updates in patient status) 0 []   - 0 Reassessment of Adherence to Treatment Plan ASSESSMENTS - Wound and Skin Assessment / Reassessment X - Simple Wound Assessment / Reassessment - one wound 1 5 []  - 0 Complex Wound Assessment / Reassessment - multiple wounds []  - 0 Dermatologic / Skin Assessment (not related to wound area) ASSESSMENTS - Focused Assessment []  - Circumferential Edema Measurements - multi extremities 0 []  - 0 Nutritional Assessment / Counseling / Intervention []  - 0 Lower Extremity Assessment (monofilament, tuning fork, pulses) []  - 0 Peripheral Arterial Disease Assessment (using hand held doppler) ASSESSMENTS - Ostomy and/or Continence Assessment and Care []  - Incontinence Assessment and Management 0 []  - 0 Ostomy Care Assessment and Management (repouching, etc.) PROCESS - Coordination of Care X - Simple Patient / Family Education for ongoing care 1 15 []  - 0 Complex (extensive) Patient / Family Education for ongoing care X- 1 10 Staff obtains Programmer, systems, Records, Test Results / Process Orders []  - 0 Staff telephones HHA, Nursing Homes / Clarify orders / etc []  - 0 Routine Transfer to another Facility (non-emergent condition) []  - 0 Routine Hospital Admission (non-emergent condition) []  - 0 New Admissions / Biomedical engineer / Ordering NPWT, Apligraf, etc. []  - 0 Emergency Hospital Admission (emergent condition) X- 1 10 Simple Discharge Coordination []  - 0 Complex (extensive) Discharge Coordination PROCESS - Special Needs []  - Pediatric / Minor Patient Management 0 []  - 0 Isolation Patient Management []  - 0 Hearing / Language / Visual special needs []  - 0 Assessment of Community assistance (transportation, D/C planning, etc.) []  - 0 Additional assistance / Altered mentation []  - 0 Support Surface(s) Assessment (bed, cushion, seat, etc.) INTERVENTIONS - Wound Cleansing / Measurement Goodson, Corrie ([id number]) X- 1 5 Simple Wound Cleansing - one wound []  - 0 Complex Wound  Cleansing - multiple wounds X- 1 5 Wound Imaging (photographs - any number of wounds) []  - 0 Wound Tracing (instead of photographs) X- 1 5 Simple Wound Measurement - one wound []  -  0 Complex Wound Measurement - multiple wounds INTERVENTIONS - Wound Dressings $RemoveBeforeD'[]'FIwbIujwIgREkl$  - Small Wound Dressing one or multiple wounds 0 X- 1 15 Medium Wound Dressing one or multiple wounds $RemoveBeforeD'[]'NXAJOOBuFWVwtq$  - 0 Large Wound Dressing one or multiple wounds X- 1 5 Application of Medications - topical $RemoveB'[]'sbHbWbZm$  - 0 Application of Medications - injection INTERVENTIONS - Miscellaneous $RemoveBeforeD'[]'hPKYXzaXlgBajp$  - External ear exam 0 $Remo'[]'UKBSm$  - 0 Specimen Collection (cultures, biopsies, blood, body fluids, etc.) $RemoveBefor'[]'zcMcWVMTFsRA$  - 0 Specimen(s) / Culture(s) sent or taken to Lab for analysis $RemoveBefo'[]'hRkwfSxQSrV$  - 0 Patient Transfer (multiple staff / Civil Service fast streamer / Similar devices) $RemoveBeforeDE'[]'ZRfvIeLLgbuwWRO$  - 0 Simple Staple / Suture removal (25 or less) $Remove'[]'SKVqiKi$  - 0 Complex Staple / Suture removal (26 or more) $Remove'[]'GSilEHA$  - 0 Hypo / Hyperglycemic Management (close monitor of Blood Glucose) $RemoveBefore'[]'hntzzkInrhGTa$  - 0 Ankle / Brachial Index (ABI) - do not check if billed separately X- 1 5 Vital Signs Has the patient been seen at the hospital within the last three years: Yes Total Score: 80 Level Of Care: New/Established - Level 3 Electronic Signature(s) Signed: 01/31/2021 3:42:16 PM By: Donnamarie Poag Entered By: Donnamarie Poag on 01/31/2021 12:01:34 [name] ([id number]) -------------------------------------------------------------------------------- Encounter Discharge Information Details Patient Name: [name] Date of Service: 01/31/2021 11:30 AM Medical Record Number: [id number] Patient Account Number: [id number] Date of Birth/Sex: 05-09-1940 (80 y.o. M) Treating RN: Donnamarie Poag Primary Care Bevely Hackbart: Alanson Aly Other Clinician: Referring Prim Morace: Alanson Aly Treating Random Dobrowski/Extender: Tito Dine in Treatment: 4 Encounter Discharge Information Items Discharge Condition: Stable Ambulatory Status:  Cane Discharge Destination: Home Transportation: Private Auto Accompanied By: self Schedule Follow-up Appointment: Yes Clinical Summary of Care: Electronic Signature(s) Signed: 01/31/2021 3:42:16 PM By: Donnamarie Poag Entered By: Donnamarie Poag on 01/31/2021 12:02:26 [name] ([id number]) -------------------------------------------------------------------------------- Lower Extremity Assessment Details Patient Name: [name] Date of Service: 01/31/2021 11:30 AM Medical Record Number: [id number] Patient Account Number: [id number] Date of Birth/Sex: 06-Aug-1940 (80 y.o. M) Treating RN: Donnamarie Poag Primary Care Jarrad Mclees: Alanson Aly Other Clinician: Referring Shaarav Ripple: Alanson Aly Treating Lillyona Polasek/Extender: Ricard Dillon Weeks in Treatment: 4 Edema Assessment Assessed: [Left: No] [Right: Yes] Edema: [Left: N] [Right: o] Vascular Assessment Pulses: Dorsalis Pedis Palpable: [Right:No Yes] Electronic Signature(s) Signed: 01/31/2021 3:42:16 PM By: Donnamarie Poag Entered By: Donnamarie Poag on 01/31/2021 11:50:25 [facility], Leane Para ([id number]) -------------------------------------------------------------------------------- Multi Wound Chart Details Patient Name: [name] Date of Service: 01/31/2021 11:30 AM Medical Record Number: [id number] Patient Account Number: [id number] Date of Birth/Sex: 17-Feb-1941 (80 y.o. M) Treating RN: Donnamarie Poag Primary Care Anakin Varkey: Alanson Aly Other Clinician: Referring Nataya Bastedo: Alanson Aly Treating Shalinda Burkholder/Extender: Tito Dine in Treatment: 4 Vital Signs Height(in): 72 Pulse(bpm): 85 Weight(lbs): 178 Blood Pressure(mmHg): 114/75 Body Mass Index(BMI): 24 Temperature(F): 97.8 Respiratory Rate(breaths/min): 16 Photos: [N/A:N/A] Wound Location: Right, Lateral Malleolus N/A N/A Wounding Event: Gradually Appeared N/A N/A Primary Etiology: Diabetic Wound/Ulcer of the Lower N/A N/A Extremity Comorbid History:  Cataracts, Arrhythmia, Congestive N/A N/A Heart Failure, Hypertension, Type II Diabetes Date Acquired: 12/06/2020 N/A N/A Weeks of Treatment: 4 N/A N/A Wound Status: Open N/A N/A Measurements L x W x D (cm) 6x2.7x1.4 N/A N/A Area (cm) : 12.723 N/A N/A Volume (cm) : 17.813 N/A N/A % Reduction in Area: -57.90% N/A N/A % Reduction in Volume: -176.30% N/A N/A Starting Position 1 (o'clock): 2 Ending Position 1 (o'clock): 5 Maximum Distance 1 (cm): 0.9 Undermining: Yes N/A N/A Classification: Grade 3 N/A N/A Exudate Amount: Large N/A N/A Exudate Type: Serosanguineous N/A N/A Exudate Color: red, brown N/A N/A Foul  Odor After Cleansing: Yes N/A N/A Odor Anticipated Due to Product No N/A N/A Use: Granulation Amount: Small (1-33%) N/A N/A Granulation Quality: Red N/A N/A Necrotic Amount: Large (67-100%) N/A N/A Necrotic Tissue: Eschar, Adherent Slough N/A N/A Exposed Structures: Fat Layer (Subcutaneous Tissue): N/A N/A Yes Tendon: Yes Bone: Yes Fascia: No Muscle: No Joint: No Treatment Notes Wound #1 (Malleolus) Wound Laterality: Right, Lateral Beadles, Elkin ([id number]) Cleanser Dakin 16 (oz) 0.25 Discharge Instruction: For cleaning---Use as directed. Peri-Wound Care Topical Primary Dressing Silvercel 4 1/4x 4 1/4 (in/in) Discharge Instruction: Apply Silvercel 4 1/4x 4 1/4 (in/in) as instructed Secondary Dressing ABD Pad 5x9 (in/in) Discharge Instruction: Cover with ABD pad [place] Discharge Instruction: Apply Conforming Stretch Guaze Bandage as directed Secured With [place] Surgical Tape, 2x2 (in/yd) Compression Wrap Compression Stockings Add-Ons Electronic Signature(s) Signed: 01/31/2021 5:14:01 PM By: Linton Ham MD Entered By: Linton Ham on 01/31/2021 12:10:25 [name] ([id number]) -------------------------------------------------------------------------------- Multi-Disciplinary Care Plan Details Patient Name:  [name] Date of Service: 01/31/2021 11:30 AM Medical Record Number: [id number] Patient Account Number: [id number] Date of Birth/Sex: [birth year] (80 y.o. M) Treating RN: Donnamarie Poag Primary Care Asiah Befort: Alanson Aly Other Clinician: Referring Lacrisha Bielicki: Alanson Aly Treating Elfriede Bonini/Extender: Tito Dine in Treatment: 4 Active Inactive Wound/Skin Impairment Nursing Diagnoses: Impaired tissue integrity Knowledge deficit related to smoking impact on wound healing Knowledge deficit related to ulceration/compromised skin integrity Goals: Patient/caregiver will verbalize understanding of skin care regimen Date Initiated: 01/03/2021 Date Inactivated: 01/17/2021 Target Resolution Date: 01/13/2021 Goal Status: Met Ulcer/skin breakdown will have a volume reduction of 30% by week 4 Date Initiated: 01/03/2021 Date Inactivated: 01/31/2021 Target Resolution Date: 02/02/2021 Unmet Reason: AVVS consult/MRI Goal Status: Unmet ordered Ulcer/skin breakdown will have a volume reduction of 50% by week 8 Date Initiated: 01/03/2021 Target Resolution Date: 03/05/2021 Goal Status: Active Ulcer/skin breakdown will have a volume reduction of 80% by week 12 Date Initiated: 01/03/2021 Target Resolution Date: 04/04/2021 Goal Status: Active Ulcer/skin breakdown will heal within 14 weeks Date Initiated: 01/03/2021 Target Resolution Date: 04/18/2021 Goal Status: Active Interventions: Assess patient/caregiver ability to obtain necessary supplies Assess patient/caregiver ability to perform ulcer/skin care regimen upon admission and as needed Assess ulceration(s) every visit Notes: Electronic Signature(s) Signed: 01/31/2021 3:42:16 PM By: Donnamarie Poag Entered By: Donnamarie Poag on 01/31/2021 11:50:42 [name] ([id number]) -------------------------------------------------------------------------------- Pain Assessment Details Patient Name: [name] Date of Service: 01/31/2021  11:30 AM Medical Record Number: [id number] Patient Account Number: [id number] Date of Birth/Sex: 08-13-1940 (80 y.o. M) Treating RN: Donnamarie Poag Primary Care Michie Molnar: Alanson Aly Other Clinician: Referring Wally Behan: Alanson Aly Treating Ebelyn Bohnet/Extender: Tito Dine in Treatment: 4 Active Problems Location of Pain Severity and Description of Pain Patient Has Paino No Site Locations Rate the pain. Current Pain Level: 0 Pain Management and Medication Current Pain Management: Electronic Signature(s) Signed: 01/31/2021 3:42:16 PM By: Donnamarie Poag Entered By: Donnamarie Poag on 01/31/2021 11:46:09 [name] ([id number]) -------------------------------------------------------------------------------- Patient/Caregiver Education Details Patient Name: [name] Date of Service: 01/31/2021 11:30 AM Medical Record Number: [id number] Patient Account Number: [id number] Date of Birth/Gender: [birth year] (80 y.o. M) Treating RN: Donnamarie Poag Primary Care Physician: Alanson Aly Other Clinician: Referring Physician: Alanson Aly Treating Physician/Extender: Tito Dine in Treatment: 4 Education Assessment Education Provided To: Patient Education Topics Provided Basic Hygiene: Wound/Skin Impairment: Electronic Signature(s) Signed: 01/31/2021 3:42:16 PM By: Donnamarie Poag Entered By: Donnamarie Poag on 01/31/2021 12:01:56 [name] ([id number]) -------------------------------------------------------------------------------- Wound Assessment Details Patient Name: [name] Date of Service:  01/31/2021 11:30 AM Medical Record Number: [id number] Patient Account Number: [id number] Date of Birth/Sex: [birth year] (80 y.o. M) Treating RN: Donnamarie Poag Primary Care Anil Havard: Alanson Aly Other Clinician: Referring Markeise Mathews: Alanson Aly Treating Nyjae Hodge/Extender: Tito Dine in Treatment: 4 Wound Status Wound Number: 1 Primary  Diabetic Wound/Ulcer of the Lower Extremity Etiology: Wound Location: Right, Lateral Malleolus Wound Status: Open Wounding Event: Gradually Appeared Comorbid Cataracts, Arrhythmia, Congestive Heart Failure, Date Acquired: 12/06/2020 History: Hypertension, Type II Diabetes Weeks Of Treatment: 4 Clustered Wound: No Photos Wound Measurements Length: (cm) 6 % R Width: (cm) 2.7 % R Depth: (cm) 1.4 Tun Area: (cm) 12.723 Un Volume: (cm) 17.813 E M eduction in Area: -57.9% eduction in Volume: -176.3% neling: No dermining: Yes Starting Position (o'clock): 2 nding Position (o'clock): 5 aximum Distance: (cm) 0.9 Wound Description Classification: Grade 3 Fo Exudate Amount: Large Du Exudate Type: Serosanguineous Sl Exudate Color: red, brown ul Odor After Cleansing: Yes e to Product Use: No ough/Fibrino Yes Wound Bed Granulation Amount: Small (1-33%) Exposed Structure Granulation Quality: Red Fascia Exposed: No Necrotic Amount: Large (67-100%) Fat Layer (Subcutaneous Tissue) Exposed: Yes Necrotic Quality: Eschar, Adherent Slough Tendon Exposed: Yes Muscle Exposed: No Joint Exposed: No Bone Exposed: Yes Treatment Notes Wound #1 (Malleolus) Wound Laterality: Right, Lateral Cleanser Dakin 16 (oz) 0.25 Rozario, Jalil ([id number]) Discharge Instruction: For cleaning---Use as directed. Peri-Wound Care Topical Primary Dressing Silvercel 4 1/4x 4 1/4 (in/in) Discharge Instruction: Apply Silvercel 4 1/4x 4 1/4 (in/in) as instructed Secondary Dressing ABD Pad 5x9 (in/in) Discharge Instruction: Cover with ABD pad [place] Discharge Instruction: Apply Conforming Stretch Guaze Bandage as directed Secured With [place] Surgical Tape, 2x2 (in/yd) Compression Wrap Compression Stockings Add-Ons Electronic Signature(s) Signed: 01/31/2021 3:42:16 PM By: Donnamarie Poag Entered By: Donnamarie Poag on 01/31/2021 12:02:42 [name]  ([id number]) -------------------------------------------------------------------------------- [place] Details Patient Name: [name] Date of Service: 01/31/2021 11:30 AM Medical Record Number: [id number] Patient Account Number: [id number] Date of Birth/Sex: 1941/01/23 (80 y.o. M) Treating RN: Donnamarie Poag Primary Care Kaydyn Sayas: Alanson Aly Other Clinician: Referring Camara Renstrom: Alanson Aly Treating Danette Weinfeld/Extender: Tito Dine in Treatment: 4 Vital Signs Time Taken: 11:44 Temperature (F): 97.8 Height (in): 72 Pulse (bpm): 77 Weight (lbs): 178 Respiratory Rate (breaths/min): 16 Body Mass Index (BMI): 24.1 Blood Pressure (mmHg): 114/75 Reference Range: 80 - 120 mg / dl Electronic Signature(s) Signed: 01/31/2021 3:42:16 PM By: Donnamarie Poag Entered ByDonnamarie Poag on 01/31/2021 11:45:42

## 2021-01-31 NOTE — Progress Notes (Signed)
COLSEN, MODI ([id number]) Visit Report for 01/31/2021 HPI Details Patient Name: [name] [name], [name] [name] Date of Service: 01/31/2021 11:30 AM Medical Record Number: [id number] Patient Account Number: [id number] Date of Birth/Sex: 1940-08-29 (80 y.o. M) Treating RN: Primary Care Provider: Cheryll Dessert Other Clinician: Referring Provider: Cheryll Dessert Treating Provider/Extender: Altamese [place] in Treatment: 4 History of Present Illness HPI Description: 01/03/2021 upon evaluation today patient appears to be doing somewhat poorly in regard to wound on his right lateral ankle. This is quite significant based on what I see today. He tells me this is a site that back in 2006 he actually had a spider bite at this location that ended up requiring a fairly significant surgery to where he tells me that "the bone and there looks like a log lying in a creek". Nonetheless this sounds like it was a fairly significant wound at that time. Nonetheless its been okay for a number of years until more recently where unfortunately on around 12/06/2020 this began to breakdown and development what we are seeing currently. There is a significant amount of necrotic tissue in the central portion of the wound. More importantly and concerning [name] his ABI is very low at 0.45 I am concerned that this is an arterial issue obviously he is diabetic and that could be playing a role as well but nonetheless I do believe we need to focus and see what we can do about getting him to vascular ASAP. The patient does have a history of diabetes mellitus type 2, hypertension, atrial fibrillation, and congestive heart failure. 01/10/2021 upon evaluation today patient appears to be doing well with regard to his wound. He has been tolerating the dressing changes without complication. The good news is the Levaquin does seem to be helping with overall clearing up the surface of the wound as well as the infection. We did do repeat fluorescence  imaging today which showed that he had much less of the red fluorescence as compared to last week. This is great news. Still this was quite significant and still were not completely out of the water as far as bacterial load is concerned. 01/17/2021; patient with a large necrotic wound over the right lateral malleolus. Arterial studies from 9/22 showed an ABI of 0.46 biphasic waveforms at the PTA on the right. His TBI is 0.24 with a toe pressure of 32 mmHg. This is indicative of severe PAD and possibly limb threatening ischemia. The patient does not describe a lot of pain except at night. He is a minimal ambulator. He is using been using Dakin's half-strength 01/24/2021 upon evaluation today patient's ankle ulcer actually starting to loosen up as far as the eschar is concerned. With that being said the patient still is having quite a bit of issues with a significant wound here. There does not appear to be any pain that is improved with the antibiotic treatment which is good news he is done with the antibiotics at this point. With that being said initially I had thought that we had sent him for an x-ray but he tells me that he did not get that form. Nonetheless I am going to send him for an x-ray of the ankle currently. Fortunately I think that the infection is significantly better by 1 to make sure there is no evidence of osteomyelitis or something more significant that would indicate that we are going to have to address this a little bit more aggressively. Patient voiced understanding. 10/10; this patient continues to have a necrotic wound  on the right lateral malleolus. This is about the same as last time I saw this. He has is vein and vascular consult on 10/13. X-ray of the area did did not show osteomyelitis but I agree with an MRI that is currently ordered on 10/18. The wound does not look any worse than the last time I saw it but its not improved significant amount of necrotic debris. Electronic  Signature(s) Signed: 01/31/2021 5:14:01 PM By: Baltazar Najjar MD Entered By: Baltazar Najjar on 01/31/2021 12:11:26 [name] [name] ([id number]) -------------------------------------------------------------------------------- Physical Exam Details Patient Name: [name] [name] Date of Service: 01/31/2021 11:30 AM Medical Record Number: [id number] Patient Account Number: [id number] Date of Birth/Sex: Jan 25, 1941 (80 y.o. M) Treating RN: Primary Care Provider: Cheryll Dessert Other Clinician: Referring Provider: Cheryll Dessert Treating Provider/Extender: Altamese [place] in Treatment: 4 Constitutional Sitting or standing Blood Pressure is within target range for patient.. Pulse regular and within target range for patient.Marland Kitchen Respirations regular, non- labored and within target range.. Temperature is normal and within the target range for the patient.Marland Kitchen appears in no distress. Cardiovascular Nonpalpable on the right foot. Notes Wound exam; punched-out area on the right lateral calcaneus. There is exposed tendon here that looked necrotic. Underneath the tendon there is palpable bone. There is no active infection around the wound but certainly this would be a setting where osteomyelitis would be possible. Electronic Signature(s) Signed: 01/31/2021 5:14:01 PM By: Baltazar Najjar MD Entered By: Baltazar Najjar on 01/31/2021 12:12:33 [name] [name] ([id number]) -------------------------------------------------------------------------------- Physician Orders Details Patient Name: [name] [name] Date of Service: 01/31/2021 11:30 AM Medical Record Number: [id number] Patient Account Number: [id number] Date of Birth/Sex: [birth year] (80 y.o. M) Treating RN: Hansel Feinstein Primary Care Provider: Cheryll Dessert Other Clinician: Referring Provider: Cheryll Dessert Treating Provider/Extender: Altamese [place] in Treatment: 4 Verbal / Phone Orders: No Diagnosis Coding Follow-up  Appointments o Return Appointment in 1 week. Bathing/ Shower/ Hygiene o May shower with wound dressing protected with water repellent cover or cast protector. o No tub bath. Edema Control - Lymphedema / Segmental Compressive Device / Other o Elevate leg(s) parallel to the floor when sitting. o DO YOUR BEST to sleep in the bed at night. DO NOT sleep in your recliner. Long hours of sitting in a recliner leads to swelling of the legs and/or potential wounds on your backside. Off-Loading o Other: - keep pressure off of the wound/right ankle to promote healing Wound Treatment Wound #1 - Malleolus Wound Laterality: Right, Lateral Cleanser: Dakin 16 (oz) 0.25 1 x Per Day/30 Days Discharge Instructions: For cleaning---Use as directed. Primary Dressing: Silvercel 4 1/4x 4 1/4 (in/in) 1 x Per Day/30 Days Discharge Instructions: Apply Silvercel 4 1/4x 4 1/4 (in/in) as instructed Secondary Dressing: ABD Pad 5x9 (in/in) (Generic) 1 x Per Day/30 Days Discharge Instructions: Cover with ABD pad Secondary Dressing: Conforming Guaze Roll-Large (Generic) 1 x Per Day/30 Days Discharge Instructions: Apply Conforming Stretch Guaze Bandage as directed Secured With: 60M Medipore H Soft Cloth Surgical Tape, 2x2 (in/yd) (Generic) 1 x Per Day/30 Days Notes Keep your appt with Vein and Vascular as schedued 02/03/21 and MRI scheduled 02/07/21 to further evaluate your right ankle Electronic Signature(s) Signed: 01/31/2021 3:42:16 PM By: Hansel Feinstein Signed: 01/31/2021 5:14:01 PM By: Baltazar Najjar MD Entered By: Hansel Feinstein on 01/31/2021 11:57:22 [name] [name] ([id number]) -------------------------------------------------------------------------------- Problem List Details Patient Name: [name] [name] Date of Service: 01/31/2021 11:30 AM Medical Record Number: [id number] Patient Account Number: [id number] Date of Birth/Sex: 1941-02-07 (80 y.o. 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 M) Treating RN: Primary Care Provider: Cheryll Dessert Other Clinician: Referring Provider: Cheryll Dessert Treating Provider/Extender: Altamese [place] in Treatment: 4 Subjective History of Present Illness (HPI) 01/03/2021 upon evaluation today patient appears to be doing somewhat poorly in regard to wound on his right lateral ankle. This is quite significant based on what I see today. He tells me this is a site that back in 2006 he actually had a spider bite at this location that ended up requiring a fairly significant surgery to where he tells me that "the bone and there looks like a log lying in a creek". Nonetheless this sounds like it was a fairly significant wound at that time. Nonetheless its been okay for a number of years until more recently where unfortunately on around 12/06/2020 this began to breakdown and development what we are seeing currently.  There is a significant amount of necrotic tissue in the central portion of the wound. More importantly and concerning [name] his ABI is very low at 0.45 I am concerned that this is an arterial issue obviously he is diabetic and that could be playing a role as well but nonetheless I do believe we need to focus and see what we can do about getting him to vascular ASAP. The patient does have a history of diabetes mellitus type 2, hypertension, atrial fibrillation, and congestive heart failure. 01/10/2021 upon evaluation today patient appears to be doing well with regard to his wound. He has been tolerating the dressing changes without complication. The good news is the Levaquin does seem to be helping with overall clearing up the surface of the wound as well as the infection. We did do repeat fluorescence imaging today which showed that he had much less of the red fluorescence as compared to last week. This is great news. Still this was quite significant and still were not completely out of the water as far as bacterial load is concerned. 01/17/2021; patient with a large necrotic wound over the right lateral malleolus. Arterial studies from 9/22 showed an ABI of 0.46 biphasic waveforms at the PTA on the right. His TBI is 0.24 with a toe pressure of 32 mmHg. This is indicative of severe PAD and possibly limb threatening ischemia. The patient does not describe a lot of pain except at night. He is a minimal ambulator. He is using been using Dakin's half-strength 01/24/2021 upon evaluation today patient's ankle ulcer actually starting to loosen up as far as the eschar is concerned. With that being said the patient still is having quite a bit of issues with a significant wound here. There does not appear to be any pain that is improved with the antibiotic treatment which is good news he is  done with the antibiotics at this point. With that being said initially I had thought that we had sent him for an x-ray but he  tells me that he did not get that form. Nonetheless I am going to send him for an x-ray of the ankle currently. Fortunately I think that the infection is significantly better by 1 to make sure there is no evidence of osteomyelitis or something more significant that would indicate that we are going to have to address this a little bit more aggressively. Patient voiced understanding. 10/10; this patient continues to have a necrotic wound on the right lateral malleolus. This is about the same as last time I saw this. He has is vein and vascular consult on 10/13. X-ray of the area did did not show osteomyelitis but I agree with an MRI that is currently ordered on 10/18. The wound does not look any worse than the last time I saw it but its not improved significant amount of necrotic debris. Objective Constitutional Sitting or standing Blood Pressure is within target range for patient.. Pulse regular and within target range for patient.Marland Kitchen Respirations regular, non- labored and within target range.. Temperature is normal and within the target range for the patient.Marland Kitchen appears in no distress. Vitals Time Taken: 11:44 AM, Height: 72 in, Weight: 178 lbs, BMI: 24.1, Temperature: 97.8 F, Pulse: 77 bpm, Respiratory Rate: 16 breaths/min, Blood Pressure: 114/75 mmHg. Cardiovascular Nonpalpable on the right foot. General Notes: Wound exam; punched-out area on the right lateral calcaneus. There is exposed tendon here that looked necrotic. Underneath the tendon there is palpable bone. There is no active infection around the wound but certainly this would be a setting where osteomyelitis would be possible. Integumentary (Hair, Skin) Wound #1 status is Open. Original cause of wound was Gradually Appeared. The date acquired was: 12/06/2020. The wound has been in treatment 4 weeks. The wound is located on the Right,Lateral Malleolus. The wound measures 6cm length x 2.7cm width x 1.4cm depth; 12.723cm^2 area and  17.813cm^3 volume. There is bone, tendon, and Fat Layer (Subcutaneous Tissue) exposed. There is no tunneling noted, however, there is undermining starting at 2:00 and ending at 5:00 with a maximum distance of 0.9cm. There is a large amount of serosanguineous drainage noted. Foul odor after cleansing was noted. There is small (1-33%) red granulation within the wound bed. There is a large (67-100%) amount of necrotic tissue within the wound bed including Eschar and Adherent Slough. [facility], Jerolyn Shin ([id number]) Assessment Active Problems ICD-10 Type 2 diabetes mellitus with other skin ulcer Non-pressure chronic ulcer of right ankle with fat layer exposed Essential (primary) hypertension Paroxysmal atrial fibrillation Chronic combined systolic (congestive) and diastolic (congestive) heart failure Plan Follow-up Appointments: Return Appointment in 1 week. Bathing/ Shower/ Hygiene: May shower with wound dressing protected with water repellent cover or cast protector. No tub bath. Edema Control - Lymphedema / Segmental Compressive Device / Other: Elevate leg(s) parallel to the floor when sitting. DO YOUR BEST to sleep in the bed at night. DO NOT sleep in your recliner. Long hours of sitting in a recliner leads to swelling of the legs and/or potential wounds on your backside. Off-Loading: Other: - keep pressure off of the wound/right ankle to promote healing General Notes: Keep your appt with Vein and Vascular as schedued 02/03/21 and MRI scheduled 02/07/21 to further evaluate your right ankle WOUND #1: - Malleolus Wound Laterality: Right, Lateral Cleanser: Dakin 16 (oz) 0.25 1 x Per Day/30 Days Discharge Instructions: For cleaning---Use as  directed. Primary Dressing: Silvercel 4 1/4x 4 1/4 (in/in) 1 x Per Day/30 Days Discharge Instructions: Apply Silvercel 4 1/4x 4 1/4 (in/in) as instructed Secondary Dressing: ABD Pad 5x9 (in/in) (Generic) 1 x Per Day/30 Days Discharge Instructions: Cover with  ABD pad Secondary Dressing: Conforming Guaze Roll-Large (Generic) 1 x Per Day/30 Days Discharge Instructions: Apply Conforming Stretch Guaze Bandage as directed Secured With: 49M Medipore H Soft Cloth Surgical Tape, 2x2 (in/yd) (Generic) 1 x Per Day/30 Days 1. Vein and vascular on 10/13 read critical limb ischemia 2. MRI next week on 10/18 3. I continued with the silver alginate. Fortunately the patient does not seem to have an active soft tissue infection. A lot of the necrotic part of this wound looks like tendon Electronic Signature(s) Signed: 01/31/2021 5:14:01 PM By: Baltazar Najjar MD Entered By: Baltazar Najjar on 01/31/2021 12:13:22 [name] [name] ([id number]) -------------------------------------------------------------------------------- SuperBill Details Patient Name: [name] [name] Date of Service: 01/31/2021 Medical Record Number: [id number] Patient Account Number: [id number] Date of Birth/Sex: April 10, 1941 (80 y.o. M) Treating RN: Hansel Feinstein Primary Care Provider: Cheryll Dessert Other Clinician: Referring Provider: Cheryll Dessert Treating Provider/Extender: Altamese [place] in Treatment: 4 Diagnosis Coding ICD-10 Codes Code Description E11.622 Type 2 diabetes mellitus with other skin 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 I10 Essential (primary) hypertension I48.0 Paroxysmal atrial fibrillation I50.42 Chronic combined systolic (congestive) and diastolic (congestive) heart failure Facility Procedures CPT4 Code: [id number] Description: 99213 - WOUND CARE VISIT-LEV 3 EST PT Modifier: Quantity: 1 Physician Procedures CPT4 Code: [id number] Description: WC PHYS LEVEL 3 o NEW PT Modifier: Quantity: 1 CPT4 Code: Description: ICD-10 Diagnosis Description L97.312 Non-pressure chronic ulcer of right ankle with fat layer exposed E11.622 Type 2 diabetes mellitus with other skin ulcer Modifier: Quantity: Electronic  Signature(s) Signed: 01/31/2021 5:14:01 PM By: Baltazar Najjar MD Entered By: Baltazar Najjar on 01/31/2021 12:13:59

## 2021-02-01 ENCOUNTER — Other Ambulatory Visit (INDEPENDENT_AMBULATORY_CARE_PROVIDER_SITE_OTHER): Payer: Self-pay | Admitting: Nurse Practitioner

## 2021-02-01 DIAGNOSIS — S91001S Unspecified open wound, right ankle, sequela: Secondary | ICD-10-CM

## 2021-02-01 DIAGNOSIS — I739 Peripheral vascular disease, unspecified: Secondary | ICD-10-CM

## 2021-02-02 ENCOUNTER — Ambulatory Visit (INDEPENDENT_AMBULATORY_CARE_PROVIDER_SITE_OTHER): Payer: Medicare HMO | Admitting: Nurse Practitioner

## 2021-02-02 ENCOUNTER — Ambulatory Visit (INDEPENDENT_AMBULATORY_CARE_PROVIDER_SITE_OTHER): Payer: Medicare HMO

## 2021-02-02 ENCOUNTER — Other Ambulatory Visit: Payer: Self-pay

## 2021-02-02 VITALS — BP 143/81 | HR 76 | Ht 72.0 in | Wt 179.0 lb

## 2021-02-02 DIAGNOSIS — S91301D Unspecified open wound, right foot, subsequent encounter: Secondary | ICD-10-CM | POA: Diagnosis not present

## 2021-02-02 DIAGNOSIS — E119 Type 2 diabetes mellitus without complications: Secondary | ICD-10-CM | POA: Insufficient documentation

## 2021-02-02 DIAGNOSIS — I509 Heart failure, unspecified: Secondary | ICD-10-CM | POA: Insufficient documentation

## 2021-02-02 DIAGNOSIS — I7025 Atherosclerosis of native arteries of other extremities with ulceration: Secondary | ICD-10-CM | POA: Diagnosis not present

## 2021-02-02 DIAGNOSIS — E1142 Type 2 diabetes mellitus with diabetic polyneuropathy: Secondary | ICD-10-CM | POA: Insufficient documentation

## 2021-02-02 DIAGNOSIS — I739 Peripheral vascular disease, unspecified: Secondary | ICD-10-CM

## 2021-02-02 DIAGNOSIS — I10 Essential (primary) hypertension: Secondary | ICD-10-CM | POA: Diagnosis not present

## 2021-02-02 DIAGNOSIS — E7800 Pure hypercholesterolemia, unspecified: Secondary | ICD-10-CM

## 2021-02-02 DIAGNOSIS — S91001S Unspecified open wound, right ankle, sequela: Secondary | ICD-10-CM | POA: Diagnosis not present

## 2021-02-07 ENCOUNTER — Encounter: Payer: Medicare HMO | Admitting: Physician Assistant

## 2021-02-07 ENCOUNTER — Other Ambulatory Visit: Payer: Self-pay

## 2021-02-07 DIAGNOSIS — I110 Hypertensive heart disease with heart failure: Secondary | ICD-10-CM | POA: Diagnosis not present

## 2021-02-07 DIAGNOSIS — I5042 Chronic combined systolic (congestive) and diastolic (congestive) heart failure: Secondary | ICD-10-CM | POA: Diagnosis not present

## 2021-02-07 DIAGNOSIS — L97316 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 without evidence of necrosis: Secondary | ICD-10-CM | POA: Diagnosis not present

## 2021-02-07 DIAGNOSIS — E11622 Type 2 diabetes mellitus with other skin ulcer: Secondary | ICD-10-CM | POA: Diagnosis not present

## 2021-02-07 DIAGNOSIS — I480 Paroxysmal atrial fibrillation: Secondary | ICD-10-CM | POA: Diagnosis not present

## 2021-02-07 DIAGNOSIS — L97312 Non-pressure chronic ulcer of right ankle with fat layer exposed: Secondary | ICD-10-CM | POA: Diagnosis not present

## 2021-02-07 DIAGNOSIS — E1151 Type 2 diabetes mellitus with diabetic peripheral angiopathy without gangrene: Secondary | ICD-10-CM | POA: Diagnosis not present

## 2021-02-07 NOTE — Progress Notes (Addendum)
DENNEY, SHEIN ([id number]) Visit Report for 02/07/2021 Chief Complaint Document Details Patient Name: [name] [name], [name] [name] Date of Service: 02/07/2021 12:30 PM Medical Record Number: [id number] Patient Account Number: [id number] Date of Birth/Sex: 10/06/1940 (80 y.o. M) Treating RN: Yevonne Pax Primary Care Provider: Cheryll Dessert Other Clinician: Referring Provider: Cheryll Dessert Treating Provider/Extender: Rowan Blase in Treatment: 5 Information Obtained from: Patient Chief Complaint Right ankle ulcer Electronic Signature(s) Signed: 02/07/2021 12:58:20 PM By: Lenda Kelp PA-C Entered By: Lenda Kelp on 02/07/2021 12:58:20 [name] [name] ([id number]) -------------------------------------------------------------------------------- HPI Details Patient Name: [name] [name] Date of Service: 02/07/2021 12:30 PM Medical Record Number: [id number] Patient Account Number: [id number] Date of Birth/Sex: 02-10-1941 (80 y.o. M) Treating RN: Yevonne Pax Primary Care Provider: Cheryll Dessert Other Clinician: Referring Provider: Cheryll Dessert Treating Provider/Extender: Rowan Blase in Treatment: 5 History of Present Illness HPI Description: 01/03/2021 upon evaluation today patient appears to be doing somewhat poorly in regard to wound on his right lateral ankle. This is quite significant based on what I see today. He tells me this is a site that back in 2006 he actually had a spider bite at this location that ended up requiring a fairly significant surgery to where he tells me that "the bone and there looks like a log lying in a creek". Nonetheless this sounds like it was a fairly significant wound at that time. Nonetheless its been okay for a number of years until more recently where unfortunately on around 12/06/2020 this began to breakdown and development what we are seeing currently. There is a significant amount of necrotic tissue in the central portion of the wound. More  importantly and concerning [name] [name] his ABI is very low at 0.45 I am concerned that this is an arterial issue obviously he is diabetic and that could be playing a role as well but nonetheless I do believe we need to focus and see what we can do about getting him to vascular ASAP. The patient does have a history of diabetes mellitus type 2, hypertension, atrial fibrillation, and congestive heart failure. 01/10/2021 upon evaluation today patient appears to be doing well with regard to his wound. He has been tolerating the dressing changes without complication. The good news is the Levaquin does seem to be helping with overall clearing up the surface of the wound as well as the infection. We did do repeat fluorescence imaging today which showed that he had much less of the red fluorescence as compared to last week. This is great news. Still this was quite significant and still were not completely out of the water as far as bacterial load is concerned. 01/17/2021; patient with a large necrotic wound over the right lateral malleolus. Arterial studies from 9/22 showed an ABI of 0.46 biphasic waveforms at the PTA on the right. His TBI is 0.24 with a toe pressure of 32 mmHg. This is indicative of severe PAD and possibly limb threatening ischemia. The patient does not describe a lot of pain except at night. He is a minimal ambulator. He is using been using Dakin's half-strength 01/24/2021 upon evaluation today patient's ankle ulcer actually starting to loosen up as far as the eschar is concerned. With that being said the patient still is having quite a bit of issues with a significant wound here. There does not appear to be any pain that is improved with the antibiotic treatment which is good news he is done with the antibiotics at this point. With that being said initially I had thought that  we had sent him for an x-ray but he tells me that he did not get that form. Nonetheless I am going to send him for an x-ray of  the ankle currently. Fortunately I think that the infection is significantly better by 1 to make sure there is no evidence of osteomyelitis or something more significant that would indicate that we are going to have to address this a little bit more aggressively. Patient voiced understanding. 10/10; this patient continues to have a necrotic wound on the right lateral malleolus. This is about the same as last time I saw this. He has is vein and vascular consult on 10/13. X-ray of the area did did not show osteomyelitis but I agree with an MRI that is currently ordered on 10/18. The wound does not look any worse than the last time I saw it but its not improved significant amount of necrotic debris. 02/07/2021 upon evaluation today patient's wound actually showing signs of still having a significant amount of necrotic tissue in the base of the wound. Again would not perform any extensive sharp debridement I do believe there is necrotic tendon noted in the base of the wound as well and again his arterial flow is very low. He is going be having an arteriogram to try to see if they can open things up which I think is needed he definitely is at the critical limb ischemia situation at this point. His ABIs on the right were 0.62 and on the left 0.87 and the TBI on the right was 0.23 with on the left was 0.26. Either way this is significant and I think he is going to need to be addressed as soon as possible vascular is already supposed to get in touch with scheduling to try to get him in for the procedure.Patient has his MRI tomorrow. Electronic Signature(s) Signed: 02/07/2021 1:35:34 PM By: Lenda Kelp PA-C Entered By: Lenda Kelp on 02/07/2021 13:35:34 [name] [name] ([id number]) -------------------------------------------------------------------------------- Physical Exam Details Patient Name: [name] [name] Date of Service: 02/07/2021 12:30 PM Medical Record Number: [id number] Patient Account  Number: [id number] Date of Birth/Sex: 31-Jul-1940 (80 y.o. M) Treating RN: Yevonne Pax Primary Care Provider: Cheryll Dessert Other Clinician: Referring Provider: Cheryll Dessert Treating Provider/Extender: Rowan Blase in Treatment: 5 Constitutional Well-nourished and well-hydrated in no acute distress. Respiratory normal breathing without difficulty. Psychiatric this patient is able to make decisions and demonstrates good insight into disease process. Alert and Oriented x 3. pleasant and cooperative. Notes Upon inspection patient's wound bed showed signs of necrotic tissue noted in the base of the wound and I think at this point based on what we are seeing that the best thing is probably can be for [place] to go ahead and revert back to the Dakin's moistened gauze dressing to try to clean up some of the necrotic debris I think this will do well for him both in controlling some of the smell as well as trying to alleviate some of the actual necrotic tissue and loosen this up so that we get it out. Thus only way that he is going to see things actually improved. Electronic Signature(s) Signed: 02/07/2021 1:36:12 PM By: Lenda Kelp PA-C Entered By: Lenda Kelp on 02/07/2021 13:36:12 [name] [name] ([id number]) -------------------------------------------------------------------------------- Physician Orders Details Patient Name: [name] [name] Date of Service: 02/07/2021 12:30 PM Medical Record Number: [id number] Patient Account Number: [id number] Date of Birth/Sex: 02/26/1941 (80 y.o. M) Treating RN: Yevonne Pax Primary Care Provider: Cheryll Dessert Other Clinician: Referring Provider:  Cheryll Dessert Treating Provider/Extender: Rowan Blase in Treatment: 5 Verbal / Phone Orders: No Diagnosis Coding ICD-10 Coding Code Description E11.622 Type 2 diabetes mellitus with other skin 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 I10 Essential  (primary) hypertension I48.0 Paroxysmal atrial fibrillation I50.42 Chronic combined systolic (congestive) and diastolic (congestive) heart failure Follow-up Appointments o Return Appointment in 1 week. Bathing/ Shower/ Hygiene o May shower with wound dressing protected with water repellent cover or cast protector. o No tub bath. Edema Control - Lymphedema / Segmental Compressive Device / Other o Elevate leg(s) parallel to the floor when sitting. o DO YOUR BEST to sleep in the bed at night. DO NOT sleep in your recliner. Long hours of sitting in a recliner leads to swelling of the legs and/or potential wounds on your backside. Off-Loading o Other: - keep pressure off of the wound/right ankle to promote healing Wound Treatment Wound #1 - Malleolus Wound Laterality: Right, Lateral Cleanser: Dakin 16 (oz) 0.25 1 x Per Day/30 Days Discharge Instructions: For cleaning---Use as directed. Primary Dressing: dakins 1 x Per Day/30 Days Discharge Instructions: wet to dry Secondary Dressing: ABD Pad 5x9 (in/in) (Generic) 1 x Per Day/30 Days Discharge Instructions: Cover with ABD pad Secondary Dressing: Conforming Guaze Roll-Large (Generic) 1 x Per Day/30 Days Discharge Instructions: Apply Conforming Stretch Guaze Bandage as directed Secured With: 3M Medipore H Soft Cloth Surgical Tape, 2x2 (in/yd) (Generic) 1 x Per Day/30 Days Electronic Signature(s) Signed: 02/07/2021 5:23:05 PM By: Lenda Kelp PA-C Signed: 02/11/2021 4:19:44 PM By: Yevonne Pax RN Entered By: Yevonne Pax on 02/07/2021 13:09:58 [name] [name] ([id number]) -------------------------------------------------------------------------------- Problem List Details Patient Name: [name] [name] Date of Service: 02/07/2021 12:30 PM Medical Record Number: [id number] Patient Account Number: [id number] Date of Birth/Sex: Mar 12, 1941 (80 y.o. M) Treating RN: Yevonne Pax Primary Care Provider: Cheryll Dessert Other  Clinician: Referring Provider: Cheryll Dessert Treating Provider/Extender: Rowan Blase in Treatment: 5 Active Problems ICD-10 Encounter Code Description Active Date MDM Diagnosis E11.622 Type 2 diabetes mellitus with other skin ulcer 01/03/2021 No Yes L97.312 Non-pressure chronic ulcer of right ankle with fat layer exposed 01/03/2021 No Yes I10 Essential (primary) hypertension 01/03/2021 No Yes I48.0 Paroxysmal atrial fibrillation 01/03/2021 No Yes I50.42 Chronic combined systolic (congestive) and diastolic (congestive) heart 01/03/2021 No Yes failure Inactive Problems Resolved Problems Electronic Signature(s) Signed: 02/07/2021 12:58:10 PM By: Lenda Kelp PA-C Entered By: Lenda Kelp on 02/07/2021 12:58:08 [name] [name] ([id number]) -------------------------------------------------------------------------------- Progress Note Details Patient Name: [name] [name] Date of Service: 02/07/2021 12:30 PM Medical Record Number: [id number] Patient Account Number: [id number] Date of Birth/Sex: 24-Aug-1940 (80 y.o. M) Treating RN: Yevonne Pax Primary Care Provider: Cheryll Dessert Other Clinician: Referring Provider: Cheryll Dessert Treating Provider/Extender: Rowan Blase in Treatment: 5 Subjective Chief Complaint Information obtained from Patient Right ankle ulcer History of Present Illness (HPI) 01/03/2021 upon evaluation today patient appears to be doing somewhat poorly in regard to wound on his right lateral ankle. This is quite significant based on what I see today. He tells me this is a site that back in 2006 he actually had a spider bite at this location that ended up requiring a fairly significant surgery to where he tells me that "the bone and there looks like a log lying in a creek". Nonetheless this sounds like it was a fairly significant wound at that time. Nonetheless its been okay for a number of years until more recently where unfortunately on  around 12/06/2020 this began to  breakdown and development what we are seeing currently. There is a significant amount of necrotic tissue in the central portion of the wound. More importantly and concerning [name] [name] his ABI is very low at 0.45 I am concerned that this is an arterial issue obviously he is diabetic and that could be playing a role as well but nonetheless I do believe we need to focus and see what we can do about getting him to vascular ASAP. The patient does have a history of diabetes mellitus type 2, hypertension, atrial fibrillation, and congestive heart failure. 01/10/2021 upon evaluation today patient appears to be doing well with regard to his wound. He has been tolerating the dressing changes without complication. The good news is the Levaquin does seem to be helping with overall clearing up the surface of the wound as well as the infection. We did do repeat fluorescence imaging today which showed that he had much less of the red fluorescence as compared to last week. This is great news. Still this was quite significant and still were not completely out of the water as far as bacterial load is concerned. 01/17/2021; patient with a large necrotic wound over the right lateral malleolus. Arterial studies from 9/22 showed an ABI of 0.46 biphasic waveforms at the PTA on the right. His TBI is 0.24 with a toe pressure of 32 mmHg. This is indicative of severe PAD and possibly limb threatening ischemia. The patient does not describe a lot of pain except at night. He is a minimal ambulator. He is using been using Dakin's half-strength 01/24/2021 upon evaluation today patient's ankle ulcer actually starting to loosen up as far as the eschar is concerned. With that being said the patient still is having quite a bit of issues with a significant wound here. There does not appear to be any pain that is improved with the antibiotic treatment which is good news he is done with the antibiotics at this point.  With that being said initially I had thought that we had sent him for an x-ray but he tells me that he did not get that form. Nonetheless I am going to send him for an x-ray of the ankle currently. Fortunately I think that the infection is significantly better by 1 to make sure there is no evidence of osteomyelitis or something more significant that would indicate that we are going to have to address this a little bit more aggressively. Patient voiced understanding. 10/10; this patient continues to have a necrotic wound on the right lateral malleolus. This is about the same as last time I saw this. He has is vein and vascular consult on 10/13. X-ray of the area did did not show osteomyelitis but I agree with an MRI that is currently ordered on 10/18. The wound does not look any worse than the last time I saw it but its not improved significant amount of necrotic debris. 02/07/2021 upon evaluation today patient's wound actually showing signs of still having a significant amount of necrotic tissue in the base of the wound. Again would not perform any extensive sharp debridement I do believe there is necrotic tendon noted in the base of the wound as well and again his arterial flow is very low. He is going be having an arteriogram to try to see if they can open things up which I think is needed he definitely is at the critical limb ischemia situation at this point. His ABIs on the right were 0.62 and on the left 0.87  and the TBI on the right was 0.23 with on the left was 0.26. Either way this is significant and I think he is going to need to be addressed as soon as possible vascular is already supposed to get in touch with scheduling to try to get him in for the procedure.Patient has his MRI tomorrow. Objective Constitutional Well-nourished and well-hydrated in no acute distress. Vitals Time Taken: 12:48 PM, Height: 72 in, Weight: 178 lbs, BMI: 24.1, Temperature: 97.6 F, Pulse: 75 bpm, Respiratory  Rate: 18 breaths/min, Blood Pressure: 96/63 mmHg. Respiratory normal breathing without difficulty. Psychiatric this patient is able to make decisions and demonstrates good insight into disease process. Alert and Oriented x 3. pleasant and cooperative. [name] [name], [name] [name] ([id number]) General Notes: Upon inspection patient's wound bed showed signs of necrotic tissue noted in the base of the wound and I think at this point based on what we are seeing that the best thing is probably can be for [place] to go ahead and revert back to the Dakin's moistened gauze dressing to try to clean up some of the necrotic debris I think this will do well for him both in controlling some of the smell as well as trying to alleviate some of the actual necrotic tissue and loosen this up so that we get it out. Thus only way that he is going to see things actually improved. Integumentary (Hair, Skin) Wound #1 status is Open. Original cause of wound was Gradually Appeared. The date acquired was: 12/06/2020. The wound has been in treatment 5 weeks. The wound is located on the Right,Lateral Malleolus. The wound measures 7cm length x 3cm width x 1.5cm depth; 16.493cm^2 area and 24.74cm^3 volume. There is bone, tendon, and Fat Layer (Subcutaneous Tissue) exposed. There is no tunneling or undermining noted. There is a medium amount of serosanguineous drainage noted. Foul odor after cleansing was noted. There is small (1-33%) red granulation within the wound bed. There is a large (67-100%) amount of necrotic tissue within the wound bed including Eschar and Adherent Slough. Assessment Active Problems ICD-10 Type 2 diabetes mellitus with other skin ulcer Non-pressure chronic ulcer of right ankle with fat layer exposed Essential (primary) hypertension Paroxysmal atrial fibrillation Chronic combined systolic (congestive) and diastolic (congestive) heart failure Plan Follow-up Appointments: Return Appointment in 1 week. Bathing/ Shower/  Hygiene: May shower with wound dressing protected with water repellent cover or cast protector. No tub bath. Edema Control - Lymphedema / Segmental Compressive Device / Other: Elevate leg(s) parallel to the floor when sitting. DO YOUR BEST to sleep in the bed at night. DO NOT sleep in your recliner. Long hours of sitting in a recliner leads to swelling of the legs and/or potential wounds on your backside. Off-Loading: Other: - keep pressure off of the wound/right ankle to promote healing WOUND #1: - Malleolus Wound Laterality: Right, Lateral Cleanser: Dakin 16 (oz) 0.25 1 x Per Day/30 Days Discharge Instructions: For cleaning---Use as directed. Primary Dressing: dakins 1 x Per Day/30 Days Discharge Instructions: wet to dry Secondary Dressing: ABD Pad 5x9 (in/in) (Generic) 1 x Per Day/30 Days Discharge Instructions: Cover with ABD pad Secondary Dressing: Conforming Guaze Roll-Large (Generic) 1 x Per Day/30 Days Discharge Instructions: Apply Conforming Stretch Guaze Bandage as directed Secured With: 39M Medipore H Soft Cloth Surgical Tape, 2x2 (in/yd) (Generic) 1 x Per Day/30 Days 1. Would recommend that we switch back to Dakin's moistened gauze dressing for the right lateral ankle. 2. The patient is also can be having his arterial angiogram  to try to see what they can do to open up blood flow on this right leg. He probably need something on the left leg as well although right now the right is obviously of greater importance. 3. I am also can recommend that we have the patient continue to monitor for any signs of worsening or infection. Obviously right now I do not see obvious infection that is dramatically improved but nonetheless this is still something that we need to keep a close eye on. We will see patient back for reevaluation in 1 week here in the clinic. If anything worsens or changes patient will contact our office for additional recommendations. Electronic Signature(s) Signed:  02/07/2021 1:37:02 PM By: Lenda Kelp PA-C Entered By: Lenda Kelp on 02/07/2021 13:37:01 [name] [name] ([id number]) -------------------------------------------------------------------------------- SuperBill Details Patient Name: [name] [name] Date of Service: 02/07/2021 Medical Record Number: [id number] Patient Account Number: [id number] Date of Birth/Sex: 1940-10-02 (80 y.o. M) Treating RN: Yevonne Pax Primary Care Provider: Cheryll Dessert Other Clinician: Referring Provider: Cheryll Dessert Treating Provider/Extender: Rowan Blase in Treatment: 5 Diagnosis Coding ICD-10 Codes Code Description E11.622 Type 2 diabetes mellitus with other skin 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 I10 Essential (primary) hypertension I48.0 Paroxysmal atrial fibrillation I50.42 Chronic combined systolic (congestive) and diastolic (congestive) heart failure Facility Procedures CPT4 Code: [id number] Description: 99213 - WOUND CARE VISIT-LEV 3 EST PT Modifier: Quantity: 1 Physician Procedures CPT4 Code: [id number] Description: 99214 - WC PHYS LEVEL 4 - EST PT Modifier: Quantity: 1 CPT4 Code: Description: ICD-10 Diagnosis Description E11.622 Type 2 diabetes mellitus with other skin 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 I10 Essential (primary) hypertension I48.0 Paroxysmal atrial fibrillation Modifier: Quantity: Electronic Signature(s) Signed: 02/07/2021 1:37:33 PM By: Lenda Kelp PA-C Entered By: Lenda Kelp on 02/07/2021 13:37:32

## 2021-02-08 ENCOUNTER — Ambulatory Visit
Admission: RE | Admit: 2021-02-08 | Discharge: 2021-02-08 | Disposition: A | Discharge status: Home / Self Care | Payer: Medicare HMO | Source: Ambulatory Visit | Attending: Physician Assistant | Admitting: Physician Assistant

## 2021-02-08 DIAGNOSIS — M25471 Effusion, right ankle: Secondary | ICD-10-CM | POA: Diagnosis not present

## 2021-02-08 DIAGNOSIS — L97312 Non-pressure chronic ulcer of right ankle with fat layer exposed: Secondary | ICD-10-CM | POA: Diagnosis not present

## 2021-02-08 DIAGNOSIS — L97309 Non-pressure chronic ulcer of unspecified ankle with unspecified severity: Secondary | ICD-10-CM | POA: Diagnosis not present

## 2021-02-08 DIAGNOSIS — M65871 Other synovitis and tenosynovitis, right ankle and foot: Secondary | ICD-10-CM | POA: Diagnosis not present

## 2021-02-08 DIAGNOSIS — M722 Plantar fascial fibromatosis: Secondary | ICD-10-CM | POA: Diagnosis not present

## 2021-02-08 MED ORDER — GADOBUTROL 1 MMOL/ML IV SOLN
8.0000 mL | Freq: Once | INTRAVENOUS | Status: AC | PRN
  Administered 2021-02-08: 8 mL via INTRAVENOUS

## 2021-02-09 ENCOUNTER — Other Ambulatory Visit (INDEPENDENT_AMBULATORY_CARE_PROVIDER_SITE_OTHER): Payer: Self-pay | Admitting: Vascular Surgery

## 2021-02-09 ENCOUNTER — Encounter (INDEPENDENT_AMBULATORY_CARE_PROVIDER_SITE_OTHER): Payer: Self-pay | Admitting: Nurse Practitioner

## 2021-02-09 DIAGNOSIS — I70235 Atherosclerosis of native arteries of right leg with ulceration of other part of foot: Secondary | ICD-10-CM

## 2021-02-09 NOTE — H&P (View-Only) (Signed)
Subjective:    Patient ID: [name] [name], male    DOB: [birth year], 80 y.o.   MRN: [id number] Chief Complaint  Patient presents with   New Patient (Initial Visit)    NP stone PVD wound right ankle . abi    [name] [name] is a 80 year old male is seen for evaluation of painful lower extremities and diminished pulses associated with ulceration of the right foot.  The patient notes the ulcer has been present for multiple weeks and has not been improving.  This is despite excellent wound care by the wound care center.  It is very painful and has had some drainage.  No specific history of trauma noted by the patient.  The patient denies fever or chills.  the patient does have diabetes which has been difficult to control.  Patient notes prior to the ulcer developing the extremities were painful particularly with ambulation or activity and the discomfort is very consistent day today. Typically, the pain occurs at less than one block, progress is as activity continues to the point that the patient must stop walking. Resting including standing still for several minutes allowed resumption of the activity and the ability to walk a similar distance before stopping again. Uneven terrain and inclined shorten the distance. The pain has been progressive over the past several years.   The patient denies rest pain or dangling of an extremity off the side of the bed during the night for relief. No prior interventions or surgeries.  No history of back problems or DJD of the lumbar sacral spine.   The patient denies amaurosis fugax or recent TIA symptoms. There are no recent neurological changes noted. The patient denies history of DVT, PE or superficial thrombophlebitis. The patient denies recent episodes of angina or shortness of breath.   Today the right ABI 0.62 with a TBI 0.23.  Left ABI is 1.11 with a TBI of 0.26.  The patient has monophasic/biphasic waveforms left lower extremity with monophasic anterior  tibial artery waveforms in the right.  The posterior tibial artery has no waveforms detected with dampened toe waveforms bilaterally.   Review of Systems  Cardiovascular:  Positive for leg swelling.  Musculoskeletal:  Positive for gait problem.  Skin:  Positive for wound.  All other systems reviewed and are negative.     Objective:   Physical Exam Vitals reviewed.  HENT:     Head: Normocephalic.  Cardiovascular:     Rate and Rhythm: Normal rate.     Pulses:          Dorsalis pedis pulses are detected w/ Doppler on the right side and detected w/ Doppler on the left side.       Posterior tibial pulses are 0 on the right side and detected w/ Doppler on the left side.  Pulmonary:     Effort: Pulmonary effort is normal.  Musculoskeletal:     Right lower leg: Edema present.  Skin:    General: Skin is dry.  Neurological:     Mental Status: He is alert and oriented to person, place, and time. Mental status is at baseline.     Gait: Gait abnormal.  Psychiatric:        Mood and Affect: Mood normal.        Behavior: Behavior normal.        Thought Content: Thought content normal.        Judgment: Judgment normal.    BP (!) 143/81   Pulse 76  Ht 6' (1.829 m)   Wt 179 lb (81.2 kg)   BMI 24.28 kg/m   Past Medical History:  Diagnosis Date   CHF (congestive heart failure) (HCC)    Diabetes (HCC)    Dysrhythmia    AFIB   HTN (hypertension)    Infection    LEG   Pancreatitis     Social History   Socioeconomic History   Marital status: Married    Spouse name: Not on file   Number of children: Not on file   Years of education: Not on file   Highest education level: Not on file  Occupational History   Not on file  Tobacco Use   Smoking status: Never   Smokeless tobacco: Current    Types: Chew  Substance and Sexual Activity   Alcohol use: No    Alcohol/week: 0.0 standard drinks   Drug use: No   Sexual activity: Not on file  Other Topics Concern   Not on file   Social History Narrative   Not on file   Social Determinants of Health   Financial Resource Strain: Not on file  Food Insecurity: Not on file  Transportation Needs: Not on file  Physical Activity: Not on file  Stress: Not on file  Social Connections: Not on file  Intimate Partner Violence: Not on file    Past Surgical History:  Procedure Laterality Date   CATARACT EXTRACTION W/PHACO Right 03/08/2017   Procedure: CATARACT EXTRACTION PHACO AND INTRAOCULAR LENS PLACEMENT (IOC);  Surgeon: Nevada Crane, MD;  Location: [facility] ORS;  Service: Ophthalmology;  Laterality: Right;  Lot# [id number] [place]: 00:30.9 AP%: 8.2 CDE: 2.54   CATARACT EXTRACTION W/PHACO Left 04/04/2017   Procedure: CATARACT EXTRACTION PHACO AND INTRAOCULAR LENS PLACEMENT (IOC);  Surgeon: Nevada Crane, MD;  Location: [facility] ORS;  Service: Ophthalmology;  Laterality: Left;  [place] 00:31.4 AP% 12.0 CDE 3.78 Fluid Pack lot # [id number]   CHOLECYSTECTOMY     EYE SURGERY     right eye    History reviewed. No pertinent family history.  No Known Allergies  CBC Latest Ref Rng & Units 12/20/2020 02/24/2012 02/23/2012  WBC 4.0 - 10.5 K/uL 12.9(H) 7.0 8.0  Hemoglobin 13.0 - 17.0 g/dL 25.8 52.7 78.2  Hematocrit 39.0 - 52.0 % 38.2(L) 44.4 43.5  Platelets 150 - 400 K/uL 200 108(L) 103(L)      CMP     Component Value Date/Time   NA 135 12/20/2020 1421   NA 143 02/24/2012 0325   K 3.7 12/20/2020 1421   K 4.1 02/24/2012 0325   CL 103 12/20/2020 1421   CL 108 (H) 02/24/2012 0325   CO2 24 12/20/2020 1421   CO2 26 02/24/2012 0325   GLUCOSE 222 (H) 12/20/2020 1421   GLUCOSE 120 (H) 02/24/2012 0325   BUN 12 12/20/2020 1421   BUN 25 (H) 02/24/2012 0325   CREATININE 1.44 (H) 12/20/2020 1421   CREATININE 1.47 (H) 02/24/2012 0325   CALCIUM 9.1 12/20/2020 1421   CALCIUM 9.1 02/24/2012 0325   PROT 7.1 02/22/2012 1451   ALBUMIN 3.6 02/22/2012 1451   AST 52 (H) 02/22/2012 1451   ALT 88 (H) 02/22/2012 1451   ALKPHOS 61  02/22/2012 1451   BILITOT 1.4 (H) 02/22/2012 1451   GFRNONAA 49 (L) 12/20/2020 1421   GFRNONAA 48 (L) 02/24/2012 0325   GFRAA 55 (L) 02/24/2012 0325     VAS [place] ABI WITH/WO TBI  Result Date: 02/08/2021  LOWER EXTREMITY DOPPLER STUDY Patient Name:  [name] [name]  Date of Exam:   02/02/2021 Medical Rec #: [id number]     Accession #:    [id number] Date of Birth: 1940-06-23     Patient Gender: M Patient Age:   23 years Exam Location:  [facility] Procedure:      VAS [place] ABI WITH/WO TBI Referring Phys: Sheppard Plumber --------------------------------------------------------------------------------  Indications: Peripheral artery disease, and slow healing wound lateral right              ankle.  Comparison Study: 01/11/2021 Performing Technologist: Reece Agar RT (R)(VS)  Examination Guidelines: A complete evaluation includes at minimum, Doppler waveform signals and systolic blood pressure reading at the level of bilateral brachial, anterior tibial, and posterior tibial arteries, when vessel segments are accessible. Bilateral testing is considered an integral part of a complete examination. Photoelectric Plethysmograph (PPG) waveforms and toe systolic pressure readings are included as required and additional duplex testing as needed. Limited examinations for reoccurring indications may be performed as noted.  ABI Findings: +---------+------------------+-----+----------+------------+ Right    Rt Pressure (mmHg)IndexWaveform  Comment      +---------+------------------+-----+----------+------------+ Brachial 122                                           +---------+------------------+-----+----------+------------+ ATA      80                     monophasic             +---------+------------------+-----+----------+------------+ PTA                                       Not detected +---------+------------------+-----+----------+------------+ Great Toe30                0.23  Abnormal               +---------+------------------+-----+----------+------------+ +---------+------------------+-----+----------+-------+ Left     Lt Pressure (mmHg)IndexWaveform  Comment +---------+------------------+-----+----------+-------+ Brachial 130                                      +---------+------------------+-----+----------+-------+ ATA      113                    monophasic        +---------+------------------+-----+----------+-------+ PTA      144               1.11 biphasic          +---------+------------------+-----+----------+-------+ Great Toe34                0.26 Abnormal          +---------+------------------+-----+----------+-------+ +-------+-----------+-----------+------------+------------+ ABI/TBIToday's ABIToday's TBIPrevious ABIPrevious TBI +-------+-----------+-----------+------------+------------+ Right  .62        .23        .54         .24          +-------+-----------+-----------+------------+------------+ Left   1.11       .26        1.08        .51          +-------+-----------+-----------+------------+------------+ Bilateral ABIs appear essentially unchanged compared to prior study on 01/11/2021. Bilateral TBIs appear essentially  unchanged compared to prior study on 01/11/2021.  Summary: Right: Resting right ankle-brachial index indicates moderate right lower extremity arterial disease. The right toe-brachial index is abnormal. Right PTA was not detected. Left: Resting left ankle-brachial index is within normal range. No evidence of significant left lower extremity arterial disease. The left toe-brachial index is abnormal. *See table(s) above for measurements and observations.  Electronically signed by Jason Dew MD on 02/08/2021 at 9:11:59 AM.    Final    VAS US ABI WITH/WO TBI  Result Date: 01/11/2021  LOWER EXTREMITY DOPPLER STUDY Patient Name:  Kylee Barbe  Date of Exam:   01/11/2021 Medical Rec #: [id number]      Accession #:    [id number] Date of Birth: 04/20/1941     Patient Gender: M Patient Age:   80 years Exam Location:  [facility] Procedure:      VAS US ABI WITH/WO TBI Referring Phys: HOYT STONE III --------------------------------------------------------------------------------  Indications: Peripheral artery disease, and slow healing wound lateral right              ankle.  Performing Technologist: Terry Knight RVT  Examination Guidelines: A complete evaluation includes at minimum, Doppler waveform signals and systolic blood pressure reading at the level of bilateral brachial, anterior tibial, and posterior tibial arteries, when vessel segments are accessible. Bilateral testing is considered an integral part of a complete examination. Photoelectric Plethysmograph (PPG) waveforms and toe systolic pressure readings are included as required and additional duplex testing as needed. Limited examinations for reoccurring indications may be performed as noted.  ABI Findings: +---------+------------------+-----+----------+--------+ Right    Rt Pressure (mmHg)IndexWaveform  Comment  +---------+------------------+-----+----------+--------+ Brachial 134                                       +---------+------------------+-----+----------+--------+ ATA      72                     monophasic.54      +---------+------------------+-----+----------+--------+ PTA      62                0.46 biphasic           +---------+------------------+-----+----------+--------+ Great Toe32                0.24 Abnormal           +---------+------------------+-----+----------+--------+ +---------+------------------+-----+--------+-------+ Left     Lt Pressure (mmHg)IndexWaveformComment +---------+------------------+-----+--------+-------+ Brachial 134                                    +---------+------------------+-----+--------+-------+ ATA                             absent           +---------+------------------+-----+--------+-------+ PTA      145               1.08 biphasic        +---------+------------------+-----+--------+-------+ Great Toe68                0.51 Normal          +---------+------------------+-----+--------+-------+  Summary: Right: Resting right ankle-brachial index indicates moderate right lower extremity arterial disease. The right toe-brachial index is abnormal. Duplex added shows dampened monophasic flow in   the distal PTA,ATA. Left: Resting left ankle-brachial index is within normal range. No evidence of significant left lower extremity arterial disease. The left toe-brachial index is abnormal.  *See table(s) above for measurements and observations.  Electronically signed by Festus Barren MD on 01/11/2021 at 4:38:01 PM.    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  This represents a limb threatening ischemia and places the patient at the risk for limb loss.  Patient should undergo angiography of the right lower extremities with the hope for intervention for limb salvage.  The risks and benefits as well as the alternative therapies was discussed in detail with the patient.  All questions were answered.  Patient agrees to proceed with angiography.  The patient will follow up with me in the office after the procedure.    2. Hypertension, unspecified type Continue antihypertensive medications as already ordered, these medications have been reviewed and there are no changes at this time.   3. Hypercholesteremia Continue statin as ordered and reviewed, no changes at this time    Current Outpatient Medications on File Prior to Visit  Medication Sig Dispense Refill   aspirin EC 81 MG tablet Take 81 mg daily by mouth.      carvedilol (COREG) 6.25 MG tablet Take 6.25 mg 2 (two) times daily by mouth.     digoxin (LANOXIN) 0.125 MG tablet Take 0.125 mcg daily by mouth.     erythromycin ophthalmic ointment Place a 1/2 inch ribbon of ointment into the lower eyelid 5 times per day 1 g 0   furosemide (LASIX) 20 MG tablet Take 20 mg daily by mouth.     Hydrocortisone Probutate 0.1 % CREA Apply 1 application topically 3 (three) times daily as needed (for itching).      lisinopril (PRINIVIL,ZESTRIL) 10 MG tablet Take 10 mg by mouth daily.     metFORMIN (GLUCOPHAGE) 1000 MG tablet Take 500 mg by mouth 2 (two) times daily with a meal.      XARELTO 20 MG TABS tablet Take 20 mg daily by mouth.     No current facility-administered medications on file prior to visit.    There are no Patient Instructions on file for this visit. No follow-ups on file.   Georgiana Spinner, NP

## 2021-02-09 NOTE — Progress Notes (Signed)
Subjective:    Patient ID: [name] [name], male    DOB: [birth year], 80 y.o.   MRN: [id number] Chief Complaint  Patient presents with   New Patient (Initial Visit)    NP stone PVD wound right ankle . abi    [name] [name] is a 80 year old male is seen for evaluation of painful lower extremities and diminished pulses associated with ulceration of the right foot.  The patient notes the ulcer has been present for multiple weeks and has not been improving.  This is despite excellent wound care by the wound care center.  It is very painful and has had some drainage.  No specific history of trauma noted by the patient.  The patient denies fever or chills.  the patient does have diabetes which has been difficult to control.  Patient notes prior to the ulcer developing the extremities were painful particularly with ambulation or activity and the discomfort is very consistent day today. Typically, the pain occurs at less than one block, progress is as activity continues to the point that the patient must stop walking. Resting including standing still for several minutes allowed resumption of the activity and the ability to walk a similar distance before stopping again. Uneven terrain and inclined shorten the distance. The pain has been progressive over the past several years.   The patient denies rest pain or dangling of an extremity off the side of the bed during the night for relief. No prior interventions or surgeries.  No history of back problems or DJD of the lumbar sacral spine.   The patient denies amaurosis fugax or recent TIA symptoms. There are no recent neurological changes noted. The patient denies history of DVT, PE or superficial thrombophlebitis. The patient denies recent episodes of angina or shortness of breath.   Today the right ABI 0.62 with a TBI 0.23.  Left ABI is 1.11 with a TBI of 0.26.  The patient has monophasic/biphasic waveforms left lower extremity with monophasic anterior  tibial artery waveforms in the right.  The posterior tibial artery has no waveforms detected with dampened toe waveforms bilaterally.   Review of Systems  Cardiovascular:  Positive for leg swelling.  Musculoskeletal:  Positive for gait problem.  Skin:  Positive for wound.  All other systems reviewed and are negative.     Objective:   Physical Exam Vitals reviewed.  HENT:     Head: Normocephalic.  Cardiovascular:     Rate and Rhythm: Normal rate.     Pulses:          Dorsalis pedis pulses are detected w/ Doppler on the right side and detected w/ Doppler on the left side.       Posterior tibial pulses are 0 on the right side and detected w/ Doppler on the left side.  Pulmonary:     Effort: Pulmonary effort is normal.  Musculoskeletal:     Right lower leg: Edema present.  Skin:    General: Skin is dry.  Neurological:     Mental Status: He is alert and oriented to person, place, and time. Mental status is at baseline.     Gait: Gait abnormal.  Psychiatric:        Mood and Affect: Mood normal.        Behavior: Behavior normal.        Thought Content: Thought content normal.        Judgment: Judgment normal.    BP (!) 143/81   Pulse 76  Ht 6' (1.829 m)   Wt 179 lb (81.2 kg)   BMI 24.28 kg/m   Past Medical History:  Diagnosis Date   CHF (congestive heart failure) (HCC)    Diabetes (HCC)    Dysrhythmia    AFIB   HTN (hypertension)    Infection    LEG   Pancreatitis     Social History   Socioeconomic History   Marital status: Married    Spouse name: Not on file   Number of children: Not on file   Years of education: Not on file   Highest education level: Not on file  Occupational History   Not on file  Tobacco Use   Smoking status: Never   Smokeless tobacco: Current    Types: Chew  Substance and Sexual Activity   Alcohol use: No    Alcohol/week: 0.0 standard drinks   Drug use: No   Sexual activity: Not on file  Other Topics Concern   Not on file   Social History Narrative   Not on file   Social Determinants of Health   Financial Resource Strain: Not on file  Food Insecurity: Not on file  Transportation Needs: Not on file  Physical Activity: Not on file  Stress: Not on file  Social Connections: Not on file  Intimate Partner Violence: Not on file    Past Surgical History:  Procedure Laterality Date   CATARACT EXTRACTION W/PHACO Right 03/08/2017   Procedure: CATARACT EXTRACTION PHACO AND INTRAOCULAR LENS PLACEMENT (IOC);  Surgeon: [name] Crane, MD;  Location: [facility] ORS;  Service: Ophthalmology;  Laterality: Right;  Lot# [id number] [place]: 00:30.9 AP%: 8.2 CDE: 2.54   CATARACT EXTRACTION W/PHACO Left 04/04/2017   Procedure: CATARACT EXTRACTION PHACO AND INTRAOCULAR LENS PLACEMENT (IOC);  Surgeon: [name] Crane, MD;  Location: [facility] ORS;  Service: Ophthalmology;  Laterality: Left;  [place] 00:31.4 AP% 12.0 CDE 3.78 Fluid Pack lot # [id number]   CHOLECYSTECTOMY     EYE SURGERY     right eye    History reviewed. No pertinent family history.  No Known Allergies  CBC Latest Ref Rng & Units 12/20/2020 02/24/2012 02/23/2012  WBC 4.0 - 10.5 K/uL 12.9(H) 7.0 8.0  Hemoglobin 13.0 - 17.0 g/dL 25.8 52.7 78.2  Hematocrit 39.0 - 52.0 % 38.2(L) 44.4 43.5  Platelets 150 - 400 K/uL 200 108(L) 103(L)      CMP     Component Value Date/Time   NA 135 12/20/2020 1421   NA 143 02/24/2012 0325   K 3.7 12/20/2020 1421   K 4.1 02/24/2012 0325   CL 103 12/20/2020 1421   CL 108 (H) 02/24/2012 0325   CO2 24 12/20/2020 1421   CO2 26 02/24/2012 0325   GLUCOSE 222 (H) 12/20/2020 1421   GLUCOSE 120 (H) 02/24/2012 0325   BUN 12 12/20/2020 1421   BUN 25 (H) 02/24/2012 0325   CREATININE 1.44 (H) 12/20/2020 1421   CREATININE 1.47 (H) 02/24/2012 0325   CALCIUM 9.1 12/20/2020 1421   CALCIUM 9.1 02/24/2012 0325   PROT 7.1 02/22/2012 1451   ALBUMIN 3.6 02/22/2012 1451   AST 52 (H) 02/22/2012 1451   ALT 88 (H) 02/22/2012 1451   ALKPHOS 61  02/22/2012 1451   BILITOT 1.4 (H) 02/22/2012 1451   GFRNONAA 49 (L) 12/20/2020 1421   GFRNONAA 48 (L) 02/24/2012 0325   GFRAA 55 (L) 02/24/2012 0325     VAS [place] ABI WITH/WO TBI  Result Date: 02/08/2021  LOWER EXTREMITY DOPPLER STUDY Patient Name:  [name] [name]  Date of Exam:   02/02/2021 Medical Rec #: [id number]     Accession #:    [id number] Date of Birth: 1940-06-23     Patient Gender: M Patient Age:   23 years Exam Location:  [facility] Procedure:      VAS [place] ABI WITH/WO TBI Referring Phys: Sheppard Plumber --------------------------------------------------------------------------------  Indications: Peripheral artery disease, and slow healing wound lateral right              ankle.  Comparison Study: 01/11/2021 Performing Technologist: Reece Agar RT (R)(VS)  Examination Guidelines: A complete evaluation includes at minimum, Doppler waveform signals and systolic blood pressure reading at the level of bilateral brachial, anterior tibial, and posterior tibial arteries, when vessel segments are accessible. Bilateral testing is considered an integral part of a complete examination. Photoelectric Plethysmograph (PPG) waveforms and toe systolic pressure readings are included as required and additional duplex testing as needed. Limited examinations for reoccurring indications may be performed as noted.  ABI Findings: +---------+------------------+-----+----------+------------+ Right    Rt Pressure (mmHg)IndexWaveform  Comment      +---------+------------------+-----+----------+------------+ Brachial 122                                           +---------+------------------+-----+----------+------------+ ATA      80                     monophasic             +---------+------------------+-----+----------+------------+ PTA                                       Not detected +---------+------------------+-----+----------+------------+ Great Toe30                0.23  Abnormal               +---------+------------------+-----+----------+------------+ +---------+------------------+-----+----------+-------+ Left     Lt Pressure (mmHg)IndexWaveform  Comment +---------+------------------+-----+----------+-------+ Brachial 130                                      +---------+------------------+-----+----------+-------+ ATA      113                    monophasic        +---------+------------------+-----+----------+-------+ PTA      144               1.11 biphasic          +---------+------------------+-----+----------+-------+ Great Toe34                0.26 Abnormal          +---------+------------------+-----+----------+-------+ +-------+-----------+-----------+------------+------------+ ABI/TBIToday's ABIToday's TBIPrevious ABIPrevious TBI +-------+-----------+-----------+------------+------------+ Right  .62        .23        .54         .24          +-------+-----------+-----------+------------+------------+ Left   1.11       .26        1.08        .51          +-------+-----------+-----------+------------+------------+ Bilateral ABIs appear essentially unchanged compared to prior study on 01/11/2021. Bilateral TBIs appear essentially  unchanged compared to prior study on 01/11/2021.  Summary: Right: Resting right ankle-brachial index indicates moderate right lower extremity arterial disease. The right toe-brachial index is abnormal. Right PTA was not detected. Left: Resting left ankle-brachial index is within normal range. No evidence of significant left lower extremity arterial disease. The left toe-brachial index is abnormal. *See table(s) above for measurements and observations.  Electronically signed by Festus Barren MD on 02/08/2021 at 9:11:59 AM.    Final    VAS [place] ABI WITH/WO TBI  Result Date: 01/11/2021  LOWER EXTREMITY DOPPLER STUDY Patient Name:  [name] [name]  Date of Exam:   01/11/2021 Medical Rec #: [id number]      Accession #:    [id number] Date of Birth: 06-Apr-1941     Patient Gender: M Patient Age:   80 years Exam Location:  [facility] Procedure:      VAS [place] ABI WITH/WO TBI Referring Phys: HOYT STONE III --------------------------------------------------------------------------------  Indications: Peripheral artery disease, and slow healing wound lateral right              ankle.  Performing Technologist: Salvadore Farber RVT  Examination Guidelines: A complete evaluation includes at minimum, Doppler waveform signals and systolic blood pressure reading at the level of bilateral brachial, anterior tibial, and posterior tibial arteries, when vessel segments are accessible. Bilateral testing is considered an integral part of a complete examination. Photoelectric Plethysmograph (PPG) waveforms and toe systolic pressure readings are included as required and additional duplex testing as needed. Limited examinations for reoccurring indications may be performed as noted.  ABI Findings: +---------+------------------+-----+----------+--------+ Right    Rt Pressure (mmHg)IndexWaveform  Comment  +---------+------------------+-----+----------+--------+ Brachial 134                                       +---------+------------------+-----+----------+--------+ ATA      72                     monophasic.54      +---------+------------------+-----+----------+--------+ PTA      62                0.46 biphasic           +---------+------------------+-----+----------+--------+ Great Toe32                0.24 Abnormal           +---------+------------------+-----+----------+--------+ +---------+------------------+-----+--------+-------+ Left     Lt Pressure (mmHg)IndexWaveformComment +---------+------------------+-----+--------+-------+ Brachial 134                                    +---------+------------------+-----+--------+-------+ ATA                             absent           +---------+------------------+-----+--------+-------+ PTA      145               1.08 biphasic        +---------+------------------+-----+--------+-------+ Great Toe68                0.51 Normal          +---------+------------------+-----+--------+-------+  Summary: Right: Resting right ankle-brachial index indicates moderate right lower extremity arterial disease. The right toe-brachial index is abnormal. Duplex added shows dampened monophasic flow in  the distal PTA,ATA. Left: Resting left ankle-brachial index is within normal range. No evidence of significant left lower extremity arterial disease. The left toe-brachial index is abnormal.  *See table(s) above for measurements and observations.  Electronically signed by Festus Barren MD on 01/11/2021 at 4:38:01 PM.    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  This represents a limb threatening ischemia and places the patient at the risk for limb loss.  Patient should undergo angiography of the right lower extremities with the hope for intervention for limb salvage.  The risks and benefits as well as the alternative therapies was discussed in detail with the patient.  All questions were answered.  Patient agrees to proceed with angiography.  The patient will follow up with me in the office after the procedure.    2. Hypertension, unspecified type Continue antihypertensive medications as already ordered, these medications have been reviewed and there are no changes at this time.   3. Hypercholesteremia Continue statin as ordered and reviewed, no changes at this time    Current Outpatient Medications on File Prior to Visit  Medication Sig Dispense Refill   aspirin EC 81 MG tablet Take 81 mg daily by mouth.      carvedilol (COREG) 6.25 MG tablet Take 6.25 mg 2 (two) times daily by mouth.     digoxin (LANOXIN) 0.125 MG tablet Take 0.125 mcg daily by mouth.     erythromycin ophthalmic ointment Place a 1/2 inch ribbon of ointment into the lower eyelid 5 times per day 1 g 0   furosemide (LASIX) 20 MG tablet Take 20 mg daily by mouth.     Hydrocortisone Probutate 0.1 % CREA Apply 1 application topically 3 (three) times daily as needed (for itching).      lisinopril (PRINIVIL,ZESTRIL) 10 MG tablet Take 10 mg by mouth daily.     metFORMIN (GLUCOPHAGE) 1000 MG tablet Take 500 mg by mouth 2 (two) times daily with a meal.      XARELTO 20 MG TABS tablet Take 20 mg daily by mouth.     No current facility-administered medications on file prior to visit.    There are no Patient Instructions on file for this visit. No follow-ups on file.   Georgiana Spinner, NP

## 2021-02-10 ENCOUNTER — Encounter
Admission: RE | Disposition: A | Discharge status: Home / Self Care | Payer: Self-pay | Source: Home / Self Care | Attending: Vascular Surgery

## 2021-02-10 ENCOUNTER — Encounter: Payer: Self-pay | Admitting: Vascular Surgery

## 2021-02-10 ENCOUNTER — Other Ambulatory Visit: Payer: Self-pay

## 2021-02-10 ENCOUNTER — Ambulatory Visit
Admission: RE | Admit: 2021-02-10 | Discharge: 2021-02-10 | Disposition: A | Discharge status: Home / Self Care | Payer: Medicare HMO | Attending: Vascular Surgery | Admitting: Vascular Surgery

## 2021-02-10 DIAGNOSIS — Z7984 Long term (current) use of oral hypoglycemic drugs: Secondary | ICD-10-CM | POA: Diagnosis not present

## 2021-02-10 DIAGNOSIS — L97519 Non-pressure chronic ulcer of other part of right foot with unspecified severity: Secondary | ICD-10-CM | POA: Insufficient documentation

## 2021-02-10 DIAGNOSIS — Z7901 Long term (current) use of anticoagulants: Secondary | ICD-10-CM | POA: Diagnosis not present

## 2021-02-10 DIAGNOSIS — E11621 Type 2 diabetes mellitus with foot ulcer: Secondary | ICD-10-CM | POA: Insufficient documentation

## 2021-02-10 DIAGNOSIS — E7800 Pure hypercholesterolemia, unspecified: Secondary | ICD-10-CM | POA: Diagnosis not present

## 2021-02-10 DIAGNOSIS — I70235 Atherosclerosis of native arteries of right leg with ulceration of other part of foot: Secondary | ICD-10-CM | POA: Diagnosis not present

## 2021-02-10 DIAGNOSIS — L97909 Non-pressure chronic ulcer of unspecified part of unspecified lower leg with unspecified severity: Secondary | ICD-10-CM

## 2021-02-10 DIAGNOSIS — Z79899 Other long term (current) drug therapy: Secondary | ICD-10-CM | POA: Diagnosis not present

## 2021-02-10 DIAGNOSIS — E1151 Type 2 diabetes mellitus with diabetic peripheral angiopathy without gangrene: Secondary | ICD-10-CM | POA: Insufficient documentation

## 2021-02-10 DIAGNOSIS — I110 Hypertensive heart disease with heart failure: Secondary | ICD-10-CM | POA: Diagnosis not present

## 2021-02-10 DIAGNOSIS — Z7982 Long term (current) use of aspirin: Secondary | ICD-10-CM | POA: Diagnosis not present

## 2021-02-10 DIAGNOSIS — I509 Heart failure, unspecified: Secondary | ICD-10-CM | POA: Insufficient documentation

## 2021-02-10 DIAGNOSIS — F17290 Nicotine dependence, other tobacco product, uncomplicated: Secondary | ICD-10-CM | POA: Diagnosis not present

## 2021-02-10 HISTORY — PX: LOWER EXTREMITY ANGIOGRAPHY: CATH118251

## 2021-02-10 LAB — GLUCOSE, CAPILLARY
Glucose-Capillary: 69 mg/dL — ABNORMAL LOW (ref 70–99)
Glucose-Capillary: 132 mg/dL — ABNORMAL HIGH (ref 70–99)
Glucose-Capillary: 104 mg/dL — ABNORMAL HIGH (ref 70–99)

## 2021-02-10 LAB — CREATININE, SERUM
Creatinine, Ser: 1.51 mg/dL — ABNORMAL HIGH (ref 0.61–1.24)
GFR, Estimated: 47 mL/min — ABNORMAL LOW (ref >60)

## 2021-02-10 LAB — BUN: BUN: 15 mg/dL (ref 8–23)

## 2021-02-10 SURGERY — LOWER EXTREMITY ANGIOGRAPHY
Anesthesia: Moderate Sedation | Site: Leg Lower | Laterality: Right

## 2021-02-10 MED ORDER — HEPARIN SODIUM (PORCINE) 1000 UNIT/ML IJ SOLN
INTRAMUSCULAR | Status: AC
  Filled 2021-02-10: qty 1

## 2021-02-10 MED ORDER — ONDANSETRON HCL 4 MG/2ML IJ SOLN: 4.0000 mg | Freq: Four times a day (QID) | INTRAMUSCULAR | Status: DC | PRN

## 2021-02-10 MED ORDER — LABETALOL HCL 5 MG/ML IV SOLN
10.0000 mg | INTRAVENOUS | Status: DC | PRN
Start: 2021-02-10 — End: 2021-02-10

## 2021-02-10 MED ORDER — HYDROMORPHONE HCL 1 MG/ML IJ SOLN
INTRAMUSCULAR | Status: AC
  Filled 2021-02-10: qty 0.5

## 2021-02-10 MED ORDER — DIPHENHYDRAMINE HCL 50 MG/ML IJ SOLN: 50.0000 mg | Freq: Once | INTRAMUSCULAR | Status: DC | PRN

## 2021-02-10 MED ORDER — ATORVASTATIN CALCIUM 10 MG PO TABS: 10.0000 mg | ORAL_TABLET | Freq: Every day | ORAL | Status: DC

## 2021-02-10 MED ORDER — MIDAZOLAM HCL 2 MG/ML PO SYRP: 8.0000 mg | ORAL_SOLUTION | Freq: Once | ORAL | Status: DC | PRN

## 2021-02-10 MED ORDER — CEFAZOLIN SODIUM-DEXTROSE 2-4 GM/100ML-% IV SOLN
2.0000 g | Freq: Once | INTRAVENOUS | Status: AC
  Administered 2021-02-10: 2 g via INTRAVENOUS

## 2021-02-10 MED ORDER — FAMOTIDINE 20 MG PO TABS: 40.0000 mg | ORAL_TABLET | Freq: Once | ORAL | Status: DC | PRN

## 2021-02-10 MED ORDER — MIDAZOLAM HCL 5 MG/5ML IJ SOLN
INTRAMUSCULAR | Status: AC
  Filled 2021-02-10: qty 5

## 2021-02-10 MED ORDER — DEXTROSE 50 % IV SOLN
INTRAVENOUS | Status: AC
  Administered 2021-02-10: 50 mL via INTRAVENOUS
  Filled 2021-02-10: qty 50

## 2021-02-10 MED ORDER — FENTANYL CITRATE (PF) 100 MCG/2ML IJ SOLN
INTRAMUSCULAR | Status: DC | PRN
  Administered 2021-02-10: 50 ug via INTRAVENOUS

## 2021-02-10 MED ORDER — ASPIRIN EC 81 MG PO TBEC: 81.0000 mg | DELAYED_RELEASE_TABLET | Freq: Every day | ORAL | 11 refills | Status: DC

## 2021-02-10 MED ORDER — HEPARIN SODIUM (PORCINE) 1000 UNIT/ML IJ SOLN
INTRAMUSCULAR | Status: DC | PRN
  Administered 2021-02-10: 5000 [IU] via INTRAVENOUS

## 2021-02-10 MED ORDER — ATORVASTATIN CALCIUM 10 MG PO TABS: 10.0000 mg | ORAL_TABLET | Freq: Every day | ORAL | 11 refills | Status: DC

## 2021-02-10 MED ORDER — HYDROMORPHONE HCL 1 MG/ML IJ SOLN
1.0000 mg | Freq: Once | INTRAMUSCULAR | Status: AC | PRN
  Administered 2021-02-10: 0.5 mg via INTRAVENOUS

## 2021-02-10 MED ORDER — SODIUM CHLORIDE 0.9 % IV SOLN: 250.0000 mL | INTRAVENOUS | Status: DC | PRN

## 2021-02-10 MED ORDER — METHYLPREDNISOLONE SODIUM SUCC 125 MG IJ SOLR: 125.0000 mg | Freq: Once | INTRAMUSCULAR | Status: DC | PRN

## 2021-02-10 MED ORDER — ACETAMINOPHEN 325 MG PO TABS: 650.0000 mg | ORAL_TABLET | ORAL | Status: DC | PRN

## 2021-02-10 MED ORDER — SODIUM CHLORIDE 0.9% FLUSH: 3.0000 mL | Freq: Two times a day (BID) | INTRAVENOUS | Status: DC

## 2021-02-10 MED ORDER — IODIXANOL 320 MG/ML IV SOLN
INTRAVENOUS | Status: DC | PRN
  Administered 2021-02-10: 50 mL via INTRA_ARTERIAL

## 2021-02-10 MED ORDER — HYDRALAZINE HCL 20 MG/ML IJ SOLN: 5.0000 mg | INTRAMUSCULAR | Status: DC | PRN

## 2021-02-10 MED ORDER — SODIUM CHLORIDE 0.9 % IV SOLN: INTRAVENOUS | Status: DC

## 2021-02-10 MED ORDER — DEXTROSE 50 % IV SOLN: 1.0000 | Freq: Once | INTRAVENOUS | Status: AC

## 2021-02-10 MED ORDER — FENTANYL CITRATE PF 50 MCG/ML IJ SOSY
PREFILLED_SYRINGE | INTRAMUSCULAR | Status: AC
  Filled 2021-02-10: qty 1

## 2021-02-10 MED ORDER — MIDAZOLAM HCL 2 MG/2ML IJ SOLN
INTRAMUSCULAR | Status: DC | PRN
  Administered 2021-02-10: 1.5 mg via INTRAVENOUS

## 2021-02-10 MED ORDER — SODIUM CHLORIDE 0.9% FLUSH: 3.0000 mL | INTRAVENOUS | Status: DC | PRN

## 2021-02-10 SURGICAL SUPPLY — 21 items
BALLN ULTRVRSE 2X220X150 (BALLOONS) ×2
BALLN ULTRVRSE 3X300X150 (BALLOONS) ×1
BALLN ULTRVRSE 3X300X150 OTW (BALLOONS) ×1
BALLOON ULTRVRSE 2X220X150 (BALLOONS) IMPLANT
BALLOON ULTRVRSE 3X300X150 OTW (BALLOONS) IMPLANT
CATH ANGIO 5F PIGTAIL 65CM (CATHETERS) ×1 IMPLANT
CATH NAVICROSS ANGLED 135CM (MICROCATHETER) ×1 IMPLANT
CATH SEEKER .035X150CM (CATHETERS) ×1 IMPLANT
CATH VERT 5FR 125CM (CATHETERS) ×1 IMPLANT
COVER PROBE U/S 5X48 (MISCELLANEOUS) ×1 IMPLANT
DEVICE STARCLOSE SE CLOSURE (Vascular Products) ×1 IMPLANT
GLIDEWIRE ADV .035X260CM (WIRE) ×1 IMPLANT
GUIDEWIRE PFTE-COATED .018X300 (WIRE) ×1 IMPLANT
KIT ENCORE 26 ADVANTAGE (KITS) ×1 IMPLANT
PACK ANGIOGRAPHY (CUSTOM PROCEDURE TRAY) ×2 IMPLANT
SHEATH BRITE TIP 5FRX11 (SHEATH) ×1 IMPLANT
SHEATH RAABE 6FRX70 (SHEATH) ×1 IMPLANT
SYR MEDRAD MARK 7 150ML (SYRINGE) ×1 IMPLANT
TUBING CONTRAST HIGH PRESS 72 (TUBING) ×1 IMPLANT
WIRE G V18X300CM (WIRE) ×1 IMPLANT
WIRE GUIDERIGHT .035X150 (WIRE) ×1 IMPLANT

## 2021-02-10 NOTE — Op Note (Signed)
[facility]  Percutaneous Study/Intervention Procedural Note   Date of Surgery: 02/10/2021  Surgeon(s):Brittan Butterbaugh    Assistants:none  Pre-operative Diagnosis: PAD with ulceration RLE  Post-operative diagnosis:  Same  Procedure(s) Performed:             1.  Ultrasound guidance for vascular access left femoral artery             2.  Catheter placement into right common femoral artery from left femoral approach             3.  Aortogram and selective right lower extremity angiogram             4.  Percutaneous transluminal angioplasty of right posterior tibial artery with 3 mm diameter angioplasty balloon             5.  Percutaneous transluminal angioplasty of the right dorsalis pedis artery with 2 mm diameter angioplasty balloon and the remainder of the anterior tibial artery with 3 mm diameter angioplasty balloon  6.  StarClose closure device left femoral artery  EBL: 5 cc  Contrast: 50 cc  Fluoro Time: 9.5 minutes  Moderate Conscious Sedation Time: approximately 43 minutes using 1.5 mg of Versed and 50 mcg of Fentanyl              Indications:  Patient is a 80 y.o.male with nonhealing ulcerations on the right foot as well as pain. The patient has noninvasive study showing markedly reduced flow in the right foot. The patient is brought in for angiography for further evaluation and potential treatment.  Due to the limb threatening nature of the situation, angiogram was performed for attempted limb salvage. The patient is aware that if the procedure fails, amputation would be expected.  The patient also understands that even with successful revascularization, amputation may still be required due to the severity of the situation.  Risks and benefits are discussed and informed consent is obtained.   Procedure:  The patient was identified and appropriate procedural time out was performed.  The patient was then placed supine on the table and prepped and draped in the  usual sterile fashion. Moderate conscious sedation was administered during a face to face encounter with the patient throughout the procedure with my supervision of the RN administering medicines and monitoring the patient's vital signs, pulse oximetry, telemetry and mental status throughout from the start of the procedure until the patient was taken to the recovery room. Ultrasound was used to evaluate the left common femoral artery.  It was patent .  A digital ultrasound image was acquired.  A Seldinger needle was used to access the left common femoral artery under direct ultrasound guidance and a permanent image was performed.  A 0.035 J wire was advanced without resistance and a 5Fr sheath was placed.  Pigtail catheter was placed into the aorta and an AP aortogram was performed. This demonstrated normal renal arteries and normal aorta and iliac segments without significant stenosis. I then crossed the aortic bifurcation and advanced to the right femoral head. Selective right lower extremity angiogram was then performed. This demonstrated Fairly normal common femoral artery, profunda femoris artery, and superficial femoral and popliteal arteries.  Circulation time was extremely slow likely due to a markedly reduced ejection fraction.  The tibial vessels were all heavily diseased.  All 3 vessels had long segment occlusion and distal opacification was extremely poor on initial imaging. It was felt that it was in the patient's best interest to  proceed with intervention after these images to avoid a second procedure and a larger amount of contrast and fluoroscopy based off of the findings from the initial angiogram. The patient was systemically heparinized and a 6 French 70 cm sheath was then placed over the Terumo Advantage wire. I then used a Kumpe catheter and the advantage wire to first get into the posterior tibial artery where selective imaging showed a long segment occlusion.  I was able to get into the mid to  distal posterior tibial artery but there remained occlusion distally.  I did elect to go ahead and perform angioplasty on the segments down to the mid to distal posterior tibial artery to improve collateral flow distally.  A 3 mm diameter by 30 cm length angioplasty balloon was inflated from the tibioperoneal trunk down to the mid to distal posterior tibial artery and inflated up to 8 atm for 1 minute.  Completion imaging showed continued occlusion distally but there was improved collateral flow.  I then turned my attention to the anterior tibial artery.  Using the Kumpe catheter and an 0.018 advantage wire was able to get into the anterior tibial artery and ultimately exchanged for a long seeker catheter and cross the long segment occlusion in the right anterior tibial artery and confirm intraluminal flow in the foot.  I then replaced the 0.018 advantage wire and performed angioplasty.  A 2 mm diameter angioplasty balloon was inflated in the dorsalis pedis artery in the foot and ankle and taken up to 10 atm for 1 minute.  2 inflations with a 3 mm diameter by 30 cm length angioplasty balloon were then used to treat from the ankle up to the origin of the anterior tibial artery to treat the long segment occlusion.  The more distal inflation was 8 atm for 1 minute and the more proximal flexion was 14 atm for 1 minute.  Completion imaging showed excellent flow through the anterior tibial artery although circulation time remained slow.  There did not appear to be greater than 20% residual stenosis. I elected to terminate the procedure. The sheath was removed and StarClose closure device was deployed in the left femoral artery with excellent hemostatic result. The patient was taken to the recovery room in stable condition having tolerated the procedure well.  Findings:               Aortogram: The aorta and iliac arteries were widely patent and somewhat ectatic.  The renal arteries appeared patent.             Right  lower Extremity: Fairly normal common femoral artery, profunda femoris artery, and superficial femoral and popliteal arteries.  Circulation time was extremely slow likely due to a markedly reduced ejection fraction.  The tibial vessels were all heavily diseased.  All 3 vessels had long segment occlusion and distal opacification was extremely poor on initial imaging.   Disposition: Patient was taken to the recovery room in stable condition having tolerated the procedure well.  Complications: None  [name] 02/10/2021 11:55 AM   This note was created with Dragon Medical transcription system. Any errors in dictation are purely unintentional.

## 2021-02-10 NOTE — Progress Notes (Signed)
Pt. Med. With Dilaudid 0.5 mg slow IVP for c/o "8 or 10" out of 10  for c/o severe right foot pain. Pt. Repeatedly speaking out "WOO: it's really starting to hurt down there."

## 2021-02-10 NOTE — Interval H&P Note (Signed)
History and Physical Interval Note:  02/10/2021 10:56 AM  [name] [name]  has presented today for surgery, with the diagnosis of RT Leg Angiography   ASO with ulceration   BARD Rep   cc: Loni Muse.  The various methods of treatment have been discussed with the patient and family. After consideration of risks, benefits and other options for treatment, the patient has consented to  Procedure(s): LOWER EXTREMITY ANGIOGRAPHY (Right) as a surgical intervention.  The patient's history has been reviewed, patient examined, no change in status, stable for surgery.  I have reviewed the patient's chart and labs.  Questions were answered to the patient's satisfaction.     Festus Barren

## 2021-02-10 NOTE — Progress Notes (Signed)
Dr. Wyn Quaker stopped by bedside to see pt. And discussed procedural results with pt. Pt. Verbalized and nodded understanding of conversation. Left groin stable without complications noted at present.

## 2021-02-11 NOTE — Progress Notes (Signed)
GODFREY, TRITSCHLER ([id number]) Visit Report for 02/07/2021 Arrival Information Details Patient Name: [name] [name], [name] [name] Date of Service: 02/07/2021 12:30 PM Medical Record Number: [id number] Patient Account Number: [id number] Date of Birth/Sex: 1940/08/31 (80 y.o. M) Treating RN: Carlene Coria Primary Care Naif Alabi: Alanson Aly Other Clinician: Referring Julina Altmann: Alanson Aly Treating Tamana Hatfield/Extender: Skipper Cliche in Treatment: 5 Visit Information History Since Last Visit All ordered tests and consults were completed: No Patient Arrived: [name] [name] Added or deleted any medications: No Arrival Time: 12:44 Any new allergies or adverse reactions: No Accompanied By: self Had a fall or experienced change in No Transfer Assistance: None activities of daily living that may affect Patient Identification Verified: Yes risk of falls: Secondary Verification Process Completed: Yes Signs or symptoms of abuse/neglect since last visito No Patient Requires Transmission-Based No Hospitalized since last visit: No Precautions: Implantable device outside of the clinic excluding No Patient Has Alerts: Yes cellular tissue based products placed in the center Patient Alerts: Patient on Blood since last visit: Thinner Has Dressing in Place as Prescribed: Yes XARELTO Pain Present Now: No Diabetic ABI left .87 TBI 0.26 ABI right .62 TBI 0.23 Electronic Signature(s) Signed: 02/11/2021 4:19:44 PM By: Carlene Coria RN Entered By: Carlene Coria on 02/07/2021 13:06:55 [name] [name] ([id number]) -------------------------------------------------------------------------------- Clinic Level of Care Assessment Details Patient Name: [name] [name] Date of Service: 02/07/2021 12:30 PM Medical Record Number: [id number] Patient Account Number: [id number] Date of Birth/Sex: 1940/04/26 (80 y.o. M) Treating RN: Carlene Coria Primary Care Lundy Cozart: Alanson Aly Other Clinician: Referring Shamiah Kahler: Alanson Aly Treating Shambhavi Salley/Extender: Skipper Cliche in Treatment: 5 Clinic Level of Care Assessment Items TOOL 4 Quantity Score X - Use when only an EandM is performed on FOLLOW-UP visit 1 0 ASSESSMENTS - Nursing Assessment / Reassessment X - Reassessment of Co-morbidities (includes updates in patient status) 1 10 X- 1 5 Reassessment of Adherence to Treatment Plan ASSESSMENTS - Wound and Skin Assessment / Reassessment X - Simple Wound Assessment / Reassessment - one wound 1 5 [] - 0 Complex Wound Assessment / Reassessment - multiple wounds [] - 0 Dermatologic / Skin Assessment (not related to wound area) ASSESSMENTS - Focused Assessment [] - Circumferential Edema Measurements - multi extremities 0 [] - 0 Nutritional Assessment / Counseling / Intervention [] - 0 Lower Extremity Assessment (monofilament, tuning fork, pulses) [] - 0 Peripheral Arterial Disease Assessment (using hand held doppler) ASSESSMENTS - Ostomy and/or Continence Assessment and Care [] - Incontinence Assessment and Management 0 [] - 0 Ostomy Care Assessment and Management (repouching, etc.) PROCESS - Coordination of Care X - Simple Patient / Family Education for ongoing care 1 15 [] - 0 Complex (extensive) Patient / Family Education for ongoing care X- 1 10 Staff obtains Programmer, systems, Records, Test Results / Process Orders [] - 0 Staff telephones HHA, Nursing Homes / Clarify orders / etc [] - 0 Routine Transfer to another Facility (non-emergent condition) [] - 0 Routine Hospital Admission (non-emergent condition) [] - 0 New Admissions / Biomedical engineer / Ordering NPWT, Apligraf, etc. [] - 0 Emergency Hospital Admission (emergent condition) X- 1 10 Simple Discharge Coordination [] - 0 Complex (extensive) Discharge Coordination PROCESS - Special Needs [] - Pediatric / Minor Patient Management 0 [] - 0 Isolation Patient Management [] - 0 Hearing / Language / Visual special needs [] -  0 Assessment of Community assistance (transportation, D/C planning, etc.) [] - 0 Additional assistance / Altered mentation [] - 0 Support Surface(s) Assessment (bed, cushion, seat, etc.) INTERVENTIONS - Wound  Cleansing / Measurement [name], [name] ([id number]) X- 1 5 Simple Wound Cleansing - one wound [] - 0 Complex Wound Cleansing - multiple wounds X- 1 5 Wound Imaging (photographs - any number of wounds) [] - 0 Wound Tracing (instead of photographs) X- 1 5 Simple Wound Measurement - one wound [] - 0 Complex Wound Measurement - multiple wounds INTERVENTIONS - Wound Dressings [] - Small Wound Dressing one or multiple wounds 0 X- 1 15 Medium Wound Dressing one or multiple wounds [] - 0 Large Wound Dressing one or multiple wounds X- 1 5 Application of Medications - topical [] - 0 Application of Medications - injection INTERVENTIONS - Miscellaneous [] - External ear exam 0 [] - 0 Specimen Collection (cultures, biopsies, blood, body fluids, etc.) [] - 0 Specimen(s) / Culture(s) sent or taken to Lab for analysis [] - 0 Patient Transfer (multiple staff / Civil Service fast streamer / Similar devices) [] - 0 Simple Staple / Suture removal (25 or less) [] - 0 Complex Staple / Suture removal (26 or more) [] - 0 Hypo / Hyperglycemic Management (close monitor of Blood Glucose) [] - 0 Ankle / Brachial Index (ABI) - do not check if billed separately X- 1 5 Vital Signs Has the patient been seen at the hospital within the last three years: Yes Total Score: 95 Level Of Care: New/Established - Level 3 Electronic Signature(s) Signed: 02/11/2021 4:19:44 PM By: Carlene Coria RN Entered By: Carlene Coria on 02/07/2021 13:09:02 [name] [name] ([id number]) -------------------------------------------------------------------------------- Encounter Discharge Information Details Patient Name: [name] [name] Date of Service: 02/07/2021 12:30 PM Medical Record Number: [id number] Patient Account Number:  [id number] Date of Birth/Sex: 1941-04-16 (80 y.o. M) Treating RN: Carlene Coria Primary Care Alnisa Hasley: Alanson Aly Other Clinician: Referring Madinah Quarry: Alanson Aly Treating Ginette Bradway/Extender: Skipper Cliche in Treatment: 5 Encounter Discharge Information Items Discharge Condition: Stable Ambulatory Status: Ambulatory Discharge Destination: Home Transportation: Private Auto Accompanied By: self Schedule Follow-up Appointment: Yes Clinical Summary of Care: Patient Declined Electronic Signature(s) Signed: 02/11/2021 4:19:44 PM By: Carlene Coria RN Entered By: Carlene Coria on 02/07/2021 13:11:19 [name] [name] ([id number]) -------------------------------------------------------------------------------- Lower Extremity Assessment Details Patient Name: [name] [name] Date of Service: 02/07/2021 12:30 PM Medical Record Number: [id number] Patient Account Number: [id number] Date of Birth/Sex: Feb 14, 1941 (80 y.o. M) Treating RN: Carlene Coria Primary Care Xitlaly Ault: Alanson Aly Other Clinician: Referring Makenzy Krist: Alanson Aly Treating Ardie Mclennan/Extender: Skipper Cliche in Treatment: 5 Edema Assessment Assessed: [Left: No] [Right: No] [Left: Edema] [Right: :] Calf Left: Right: Point of Measurement: 35 cm From Medial Instep 29 cm Ankle Left: Right: Point of Measurement: 8 cm From Medial Instep 23 cm Knee To Floor Left: Right: From Medial Instep 40 cm Vascular Assessment Pulses: Dorsalis Pedis Palpable: [Right:Yes] Electronic Signature(s) Signed: 02/11/2021 4:19:44 PM By: Carlene Coria RN Entered By: Carlene Coria on 02/07/2021 12:54:56 [name] [name] ([id number]) -------------------------------------------------------------------------------- Multi Wound Chart Details Patient Name: [name] [name] Date of Service: 02/07/2021 12:30 PM Medical Record Number: [id number] Patient Account Number: [id number] Date of Birth/Sex: Nov 03, 1940 (80 y.o. M) Treating RN: Carlene Coria Primary Care Senaya Dicenso: Alanson Aly Other Clinician: Referring Daysha Ashmore: Alanson Aly Treating Arlis Everly/Extender: Skipper Cliche in Treatment: 5 Vital Signs Height(in): 72 Pulse(bpm): 13 Weight(lbs): 178 Blood Pressure(mmHg): 96/63 Body Mass Index(BMI): 24 Temperature(F): 97.6 Respiratory Rate(breaths/min): 18 Photos: [N/A:N/A] Wound Location: Right, Lateral Malleolus N/A N/A Wounding Event: Gradually Appeared N/A N/A Primary Etiology: Diabetic Wound/Ulcer of the Lower N/A N/A Extremity Comorbid History: Cataracts, Arrhythmia, Congestive N/A N/A Heart Failure, Hypertension, Type II Diabetes Date Acquired: 12/06/2020  N/A N/A Weeks of Treatment: 5 N/A N/A Wound Status: Open N/A N/A Measurements L x W x D (cm) 7x3x1.5 N/A N/A Area (cm) : 16.493 N/A N/A Volume (cm) : 24.74 N/A N/A % Reduction in Area: -104.70% N/A N/A % Reduction in Volume: -283.70% N/A N/A Classification: Grade 3 N/A N/A Exudate Amount: Medium N/A N/A Exudate Type: Serosanguineous N/A N/A Exudate Color: red, brown N/A N/A Foul Odor After Cleansing: Yes N/A N/A Odor Anticipated Due to Product No N/A N/A Use: Granulation Amount: Small (1-33%) N/A N/A Granulation Quality: Red N/A N/A Necrotic Amount: Large (67-100%) N/A N/A Necrotic Tissue: Eschar, Adherent Slough N/A N/A Exposed Structures: Fat Layer (Subcutaneous Tissue): N/A N/A Yes Tendon: Yes Bone: Yes Fascia: No Muscle: No Joint: No Epithelialization: None N/A N/A Treatment Notes Electronic Signature(s) Signed: 02/11/2021 4:19:44 PM By: Carlene Coria RN [facility] [place] ([id number]) Entered By: Carlene Coria on 02/07/2021 13:07:32 [name] [name] ([id number]) -------------------------------------------------------------------------------- Multi-Disciplinary Care Plan Details Patient Name: [name] [name] Date of Service: 02/07/2021 12:30 PM Medical Record Number: [id number] Patient Account Number: [id number] Date of Birth/Sex:  28-Aug-1940 (80 y.o. M) Treating RN: Carlene Coria Primary Care : Alanson Aly Other Clinician: Referring : Alanson Aly Treating /Extender: Skipper Cliche in Treatment: 5 Active Inactive Wound/Skin Impairment Nursing Diagnoses: Impaired tissue integrity Knowledge deficit related to smoking impact on wound healing Knowledge deficit related to ulceration/compromised skin integrity Goals: Patient/caregiver will verbalize understanding of skin care regimen Date Initiated: 01/03/2021 Date Inactivated: 01/17/2021 Target Resolution Date: 01/13/2021 Goal Status: Met Ulcer/skin breakdown will have a volume reduction of 30% by week 4 Date Initiated: 01/03/2021 Date Inactivated: 01/31/2021 Target Resolution Date: 02/02/2021 Unmet Reason: AVVS consult/MRI Goal Status: Unmet ordered Ulcer/skin breakdown will have a volume reduction of 50% by week 8 Date Initiated: 01/03/2021 Target Resolution Date: 03/05/2021 Goal Status: Active Ulcer/skin breakdown will have a volume reduction of 80% by week 12 Date Initiated: 01/03/2021 Target Resolution Date: 04/04/2021 Goal Status: Active Ulcer/skin breakdown will heal within 14 weeks Date Initiated: 01/03/2021 Target Resolution Date: 04/18/2021 Goal Status: Active Interventions: Assess patient/caregiver ability to obtain necessary supplies Assess patient/caregiver ability to perform ulcer/skin care regimen upon admission and as needed Assess ulceration(s) every visit Notes: Electronic Signature(s) Signed: 02/11/2021 4:19:44 PM By: Carlene Coria RN Entered By: Carlene Coria on 02/07/2021 13:07:12 [name] [name] ([id number]) -------------------------------------------------------------------------------- Pain Assessment Details Patient Name: [name] [name] Date of Service: 02/07/2021 12:30 PM Medical Record Number: [id number] Patient Account Number: [id number] Date of Birth/Sex: 01/27/1941 (80 y.o. M) Treating RN: Carlene Coria Primary Care : Alanson Aly Other Clinician: Referring : Alanson Aly Treating /Extender: Skipper Cliche in Treatment: 5 Active Problems Location of Pain Severity and Description of Pain Patient Has Paino No Site Locations Pain Management and Medication Current Pain Management: Electronic Signature(s) Signed: 02/11/2021 4:19:44 PM By: Carlene Coria RN Entered By: Carlene Coria on 02/07/2021 12:49:06 [name] [name] ([id number]) -------------------------------------------------------------------------------- Patient/Caregiver Education Details Patient Name: [name] [name] Date of Service: 02/07/2021 12:30 PM Medical Record Number: [id number] Patient Account Number: [id number] Date of Birth/Gender: 1940/11/29 (80 y.o. M) Treating RN: Carlene Coria Primary Care Physician: Alanson Aly Other Clinician: Referring Physician: Alanson Aly Treating Physician/Extender: Skipper Cliche in Treatment: 5 Education Assessment Education Provided To: Patient Education Topics Provided Wound/Skin Impairment: Methods: Explain/Verbal Responses: State content correctly Electronic Signature(s) Signed: 02/11/2021 4:19:44 PM By: Carlene Coria RN Entered By: Carlene Coria on 02/07/2021 13:10:32 [name] [name] ([id number]) -------------------------------------------------------------------------------- Wound Assessment Details Patient Name: [name] [name] Date of Service: 02/07/2021 12:30 PM Medical Record Number: [id number] Patient  Account Number: [id number] Date of Birth/Sex: [birth year] (80 y.o. M) Treating RN: Carlene Coria Primary Care : Alanson Aly Other Clinician: Referring : Alanson Aly Treating /Extender: Skipper Cliche in Treatment: 5 Wound Status Wound Number: 1 Primary Diabetic Wound/Ulcer of the Lower Extremity Etiology: Wound Location: Right, Lateral Malleolus Wound Status: Open Wounding Event: Gradually  Appeared Comorbid Cataracts, Arrhythmia, Congestive Heart Failure, Date Acquired: 12/06/2020 History: Hypertension, Type II Diabetes Weeks Of Treatment: 5 Clustered Wound: No Photos Wound Measurements Length: (cm) 7 Width: (cm) 3 Depth: (cm) 1.5 Area: (cm) 16.493 Volume: (cm) 24.74 % Reduction in Area: -104.7% % Reduction in Volume: -283.7% Epithelialization: None Tunneling: No Undermining: No Wound Description Classification: Grade 3 Exudate Amount: Medium Exudate Type: Serosanguineous Exudate Color: red, brown Foul Odor After Cleansing: Yes Due to Product Use: No Slough/Fibrino Yes Wound Bed Granulation Amount: Small (1-33%) Exposed Structure Granulation Quality: Red Fascia Exposed: No Necrotic Amount: Large (67-100%) Fat Layer (Subcutaneous Tissue) Exposed: Yes Necrotic Quality: Eschar, Adherent Slough Tendon Exposed: Yes Muscle Exposed: No Joint Exposed: No Bone Exposed: Yes Treatment Notes Wound #1 (Malleolus) Wound Laterality: Right, Lateral Cleanser Dakin 16 (oz) 0.25 Discharge Instruction: For cleaning---Use as directed. Peri-Wound Care [name] [name], [name] [name] ([id number]) Topical Primary Dressing dakins Discharge Instruction: wet to dry Secondary Dressing ABD Pad 5x9 (in/in) Discharge Instruction: Cover with ABD pad [place] Discharge Instruction: Apply Conforming Stretch Guaze Bandage as directed Secured With [employer] Surgical Tape, 2x2 (in/yd) Compression Wrap Compression Stockings Add-Ons Electronic Signature(s) Signed: 02/11/2021 4:19:44 PM By: Carlene Coria RN Entered By: Carlene Coria on 02/07/2021 12:53:46 [name] [name] ([id number]) -------------------------------------------------------------------------------- [place] Details Patient Name: [name] [name] Date of Service: 02/07/2021 12:30 PM Medical Record Number: [id number] Patient Account Number: [id number] Date of Birth/Sex: 03-09-1941 (80 y.o. M) Treating RN:  Carlene Coria Primary Care : Alanson Aly Other Clinician: Referring : Alanson Aly Treating /Extender: Skipper Cliche in Treatment: 5 Vital Signs Time Taken: 12:48 Temperature (F): 97.6 Height (in): 72 Pulse (bpm): 75 Weight (lbs): 178 Respiratory Rate (breaths/min): 18 Body Mass Index (BMI): 24.1 Blood Pressure (mmHg): 96/63 Reference Range: 80 - 120 mg / dl Electronic Signature(s) Signed: 02/11/2021 4:19:44 PM By: Carlene Coria RN Entered By: Carlene Coria on 02/07/2021 12:48:57

## 2021-02-15 ENCOUNTER — Encounter: Payer: Medicare HMO | Admitting: Physician Assistant

## 2021-02-15 ENCOUNTER — Other Ambulatory Visit: Payer: Self-pay

## 2021-02-15 DIAGNOSIS — L97316 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 without evidence of necrosis: Secondary | ICD-10-CM | POA: Diagnosis not present

## 2021-02-15 DIAGNOSIS — L97312 Non-pressure chronic ulcer of right ankle with fat layer exposed: Secondary | ICD-10-CM | POA: Diagnosis not present

## 2021-02-15 DIAGNOSIS — I5042 Chronic combined systolic (congestive) and diastolic (congestive) heart failure: Secondary | ICD-10-CM | POA: Diagnosis not present

## 2021-02-15 DIAGNOSIS — E11622 Type 2 diabetes mellitus with other skin ulcer: Secondary | ICD-10-CM | POA: Diagnosis not present

## 2021-02-15 DIAGNOSIS — E1151 Type 2 diabetes mellitus with diabetic peripheral angiopathy without gangrene: Secondary | ICD-10-CM | POA: Diagnosis not present

## 2021-02-15 DIAGNOSIS — I110 Hypertensive heart disease with heart failure: Secondary | ICD-10-CM | POA: Diagnosis not present

## 2021-02-15 DIAGNOSIS — I480 Paroxysmal atrial fibrillation: Secondary | ICD-10-CM | POA: Diagnosis not present

## 2021-02-15 NOTE — Progress Notes (Signed)
[name] [name] ([id number]) Visit Report for 02/15/2021 HBO Risk Assessment Details Patient Name: [name] [name] Date of Service: 02/15/2021 8:30 AM Medical Record Number: [id number] Patient Account Number: [id number] Date of Birth/Sex: February 24, 1941 (80 y.o. Male) Treating RN: Hansel Feinstein Primary Care Jenkins Risdon: Lorenso Quarry Other Clinician: Referring Brenetta Penny: Cheryll Dessert Treating Kimberl Vig/Extender: Rowan Blase in Treatment: 6 HBO Risk Assessment Items Answer Any "Yes" answers must be brought to the hyperbaric physician's attention. Breathing or Lung problemso No Currently use tobacco productso No Used tobacco products in the pasto No Heart problemso Yes Seen a doctor for any heart or blood pressure problemso Yes If yes, provide the name of doctor: PCP [place] Do you take water pills (diuretic)o Yes If yes, last time taken: furosemide Diabeteso Yes On Diabetes pillo Yes If yes, last time taken: 02/15/21 On Insulino No Dialysiso No Eye problems like glaucomao No Ear problems or surgeryo No Sinus Problemso No Cancero No Confinement Anxiety (Claustrophobia- fear of confined places)o No Any medical implants/devices that are fully or partially implanted or attached to your bodyo No Pregnanto No Seizureso No Date of last: CBC: 12/20/2020 Electronic Signature(s) Signed: 02/15/2021 4:40:41 PM By: Hansel Feinstein Entered ByHansel Feinstein on 02/15/2021 09:38:48

## 2021-02-15 NOTE — Progress Notes (Signed)
BENETT, SWOYER ([id number]) Visit Report for 02/15/2021 Chief Complaint Document Details Patient Name: [name] [name] Date of Service: 02/15/2021 8:30 AM Medical Record Number: [id number] Patient Account Number: [id number] Date of Birth/Sex: [birth year] (80 y.o. Male) Treating RN: Hansel Feinstein Primary Care Provider: Lorenso Quarry Other Clinician: Referring Provider: Cheryll Dessert Treating Provider/Extender: Rowan Blase in Treatment: 6 Information Obtained from: Patient Chief Complaint Right ankle ulcer with osteomyelitis Electronic Signature(s) Signed: 02/15/2021 9:39:29 AM By: Lenda Kelp PA-C Entered By: Lenda Kelp on 02/15/2021 09:39:29 [name] [name] ([id number]) -------------------------------------------------------------------------------- Debridement Details Patient Name: [name] [name] Date of Service: 02/15/2021 8:30 AM Medical Record Number: [id number] Patient Account Number: [id number] Date of Birth/Sex: 1940-07-16 (80 y.o. Male) Treating RN: Hansel Feinstein Primary Care Provider: Lorenso Quarry Other Clinician: Referring Provider: Cheryll Dessert Treating Provider/Extender: Rowan Blase in Treatment: 6 Debridement Performed for Wound #1 Right,Lateral Malleolus Assessment: Performed By: Physician [name] [name]., PA-C Debridement Type: Debridement Severity of Tissue Pre Debridement: Bone involvement without necrosis Level of Consciousness (Pre- Awake and Alert procedure): Pre-procedure Verification/Time Out Yes - 09:19 Taken: Start Time: 09:19 Pain Control: Lidocaine Total Area Debrided (L x W): 7 (cm) x 3 (cm) = 21 (cm) Tissue and other material Viable, Non-Viable, Eschar, Muscle, Slough, Subcutaneous, Tendon, Fascia , Slough debrided: Level: Skin/Subcutaneous Tissue/Muscle Debridement Description: Excisional Instrument: Forceps, Scissors Bleeding: Minimum Hemostasis Achieved: Pressure Response to Treatment: Procedure was tolerated  well Level of Consciousness (Post- Awake and Alert procedure): Post Debridement Measurements of Total Wound Length: (cm) 7 Width: (cm) 3 Depth: (cm) 2 Volume: (cm) 32.987 Character of Wound/Ulcer Post Debridement: Improved Severity of Tissue Post Debridement: Bone involvement without necrosis Post Procedure Diagnosis Same as Pre-procedure Electronic Signature(s) Signed: 02/15/2021 10:05:15 AM By: Lenda Kelp PA-C Signed: 02/15/2021 4:40:41 PM By: Hansel Feinstein Entered By: Lenda Kelp on 02/15/2021 10:05:15 [name] [name] ([id number]) -------------------------------------------------------------------------------- HPI Details Patient Name: [name] [name] Date of Service: 02/15/2021 8:30 AM Medical Record Number: [id number] Patient Account Number: [id number] Date of Birth/Sex: Sep 05, 1940 (80 y.o. Male) Treating RN: Hansel Feinstein Primary Care Provider: Lorenso Quarry Other Clinician: Referring Provider: Cheryll Dessert Treating Provider/Extender: Rowan Blase in Treatment: 6 History of Present Illness HPI Description: 01/03/2021 upon evaluation today patient appears to be doing somewhat poorly in regard to wound on his right lateral ankle. This is quite significant based on what I see today. He tells me this is a site that back in 2006 he actually had a spider bite at this location that ended up requiring a fairly significant surgery to where he tells me that "the bone and there looks like a log lying in a creek". Nonetheless this sounds like it was a fairly significant wound at that time. Nonetheless its been okay for a number of years until more recently where unfortunately on around 12/06/2020 this began to breakdown and development what we are seeing currently. There is a significant amount of necrotic tissue in the central portion of the wound. More importantly and concerning [name] his ABI is very low at 0.45 I am concerned that this is an arterial issue obviously he is  diabetic and that could be playing a role as well but nonetheless I do believe we need to focus and see what we can do about getting him to vascular ASAP. The patient does have a history of diabetes mellitus type 2, hypertension, atrial fibrillation, and congestive heart failure. 01/10/2021 upon evaluation today patient appears to be doing well with regard to his wound. He has been tolerating  the dressing changes without complication. The good news is the Levaquin does seem to be helping with overall clearing up the surface of the wound as well as the infection. We did do repeat fluorescence imaging today which showed that he had much less of the red fluorescence as compared to last week. This is great news. Still this was quite significant and still were not completely out of the water as far as bacterial load is concerned. 01/17/2021; patient with a large necrotic wound over the right lateral malleolus. Arterial studies from 9/22 showed an ABI of 0.46 biphasic waveforms at the PTA on the right. His TBI is 0.24 with a toe pressure of 32 mmHg. This is indicative of severe PAD and possibly limb threatening ischemia. The patient does not describe a lot of pain except at night. He is a minimal ambulator. He is using been using Dakin's half-strength 01/24/2021 upon evaluation today patient's ankle ulcer actually starting to loosen up as far as the eschar is concerned. With that being said the patient still is having quite a bit of issues with a significant wound here. There does not appear to be any pain that is improved with the antibiotic treatment which is good news he is done with the antibiotics at this point. With that being said initially I had thought that we had sent him for an x-ray but he tells me that he did not get that form. Nonetheless I am going to send him for an x-ray of the ankle currently. Fortunately I think that the infection is significantly better by 1 to make sure there is no evidence  of osteomyelitis or something more significant that would indicate that we are going to have to address this a little bit more aggressively. Patient voiced understanding. 10/10; this patient continues to have a necrotic wound on the right lateral malleolus. This is about the same as last time I saw this. He has is vein and vascular consult on 10/13. X-ray of the area did did not show osteomyelitis but I agree with an MRI that is currently ordered on 10/18. The wound does not look any worse than the last time I saw it but its not improved significant amount of necrotic debris. 02/07/2021 upon evaluation today patient's wound actually showing signs of still having a significant amount of necrotic tissue in the base of the wound. Again would not perform any extensive sharp debridement I do believe there is necrotic tendon noted in the base of the wound as well and again his arterial flow is very low. He is going be having an arteriogram to try to see if they can open things up which I think is needed he definitely is at the critical limb ischemia situation at this point. His ABIs on the right were 0.62 and on the left 0.87 and the TBI on the right was 0.23 with on the left was 0.26. Either way this is significant and I think he is going to need to be addressed as soon as possible vascular is already supposed to get in touch with scheduling to try to get him in for the procedure.Patient has his MRI tomorrow. 02/15/2021 upon evaluation today patient presents for follow-up evaluation regarding his right ankle ulcer. We do have several results to review today. I did actually look at his MRI which showed evidence of abnormal edema and endosteal enhancement laterally in the distal fibula compatible with osteomyelitis. There was also tenosynovitis noted proximally to the ulceration. Subsequently the patient  did have subcutaneous edema on the dorsum of the foot along with medial ankle without definitive  enhancement nonspecific for cellulitis versus infectious edema. Either way this seems to be related as well. I do believe that he likely does have an infection at this time that is prevalent both in the bone as well as in the soft tissues noted currently. With regard to the vascular evaluation and intervention he did have this on 02/10/2021 and this was a aortogram and selective right lower extremity angiogram. Subsequently of note the patient did have to have angioplasty on the segments down to the mid to distal posterior tibial artery to improve collateral flow distally. He also had occlusions that were still noted distally once that was completed. Subsequently the patient furthermore had balloon angioplasty of the ankle up to the origin of the anterior tibial artery to treat the long segment occlusion. Completion imaging showed excellent flow through the anterior tibial artery although circulation time remain slow there did not appear to be any greater than 20% residual stenosis which was good news. Lastly I did review the patient's echocardiogram as well this was performed January of this year and showed an ejection fraction which was stated to be 55%. Overall I think this is compatible with him going forward with hyperbaric oxygen therapy. Nonetheless we will have to get a few other things taken care of as far as hyperbaric planning is concerned. Electronic Signature(s) Signed: 02/15/2021 10:41:13 AM By: Lenda Kelp PA-C Entered By: Lenda Kelp on 02/15/2021 10:41:13 [name] [name] ([id number]) -------------------------------------------------------------------------------- Physical Exam Details Patient Name: [name] [name] Date of Service: 02/15/2021 8:30 AM Medical Record Number: [id number] Patient Account Number: [id number] Date of Birth/Sex: 10-19-1940 (80 y.o. Male) Treating RN: Hansel Feinstein Primary Care Provider: Lorenso Quarry Other Clinician: Referring Provider: Cheryll Dessert Treating Provider/Extender: Rowan Blase in Treatment: 6 Constitutional Well-nourished and well-hydrated in no acute distress. Respiratory normal breathing without difficulty. Psychiatric this patient is able to make decisions and demonstrates good insight into disease process. Alert and Oriented x 3. pleasant and cooperative. Notes Upon inspection patient's wound bed still showed signs of necrotic tissue including necrotic muscle and a little bit of necrotic tendon noted as well as subcutaneous tissue that had to be debrided away. I performed a debridement today removing a vast majority of this down to the fibular region. I did not expose bone but it was very close and definitely some of the connective tissue/fascia covering was removed as it was nonviable. We are still not down to a completely clean wound surface but is getting much better. There still can be a significant amount of healing that is can have to take place here. Being that he does have osteomyelitis I do believe that hyperbaric oxygen therapy would greatly support him in the healing process. This will help both with healing as well as helping out with the treatment of the osteomyelitis which is also a big deal here. Fortunately I think that this is something we should be able to gain approval for we just need to go through the appropriate steps. Electronic Signature(s) Signed: 02/15/2021 11:29:48 AM By: Lenda Kelp PA-C Entered By: Lenda Kelp on 02/15/2021 11:29:48 [name] [name] ([id number]) -------------------------------------------------------------------------------- Physician Orders Details Patient Name: [name] [name] Date of Service: 02/15/2021 8:30 AM Medical Record Number: [id number] Patient Account Number: [id number] Date of Birth/Sex: Sep 14, 1940 (80 y.o. 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 o Other: - See your primary care physician about your low blood pressure and pain control Bathing/ Shower/ Hygiene o May shower with wound dressing protected with water repellent cover or cast protector. o No tub bath. Edema Control - Lymphedema / Segmental Compressive Device / Other o Elevate leg(s) parallel to the floor when sitting. o DO YOUR BEST to sleep in the bed at night. DO NOT sleep in your recliner. Long hours of sitting in a recliner leads to swelling of the legs and/or potential wounds on your backside. Off-Loading o Other: - keep pressure off of the wound/right ankle to promote healing Additional Orders / Instructions o Decrease/Stop Smoking - tobacco products not recommended o Follow Nutritious Diet and Increase Protein Intake - increase protein intake; eat breakfast o Other: - Check your blood sugar daily Hyperbaric Oxygen Therapy Wound #1 Right,Lateral Malleolus o Evaluate for HBO Therapy o Indication and location: - Osteomyelitis with Wag Grade 3 ulcer right ankle o If appropriate for treatment, begin HBOT per protocol: o 2.5 ATA for 90 Minutes with 2 Five (5) Minute Air Breaks o One treatment per day (delivered Monday through Friday unless otherwise specified in Special Instructions below): o Total # of Treatments: - 40 o Finger stick Blood Glucose Pre- and Post- HBOT  Treatment. o Follow Hyperbaric Oxygen Glycemia Protocol Medications-Please add to medication list. o Take one  Tylenol (Acetaminophen) and one  Motrin (Ibuprofen) every 6 hours for pain. Do not take ibuprofen if you are on blood thinners or have stomach ulcers. - for pain as needed o P.O. Antibiotics - Pick up and restart antibiotics as prescribed Wound Treatment Wound #1 - Malleolus Wound Laterality: Right, Lateral Secondary Dressing: ABD Pad 5x9 (in/in) (Generic) 1 x Per Day/30 Days Discharge Instructions: Cover with ABD pad Secondary Dressing: Conforming Guaze Roll-Large (Generic) 1 x Per Day/30 Days Discharge Instructions: Apply Conforming Stretch Guaze Bandage as directed NEEMA, FLUEGGE ([id number]) Secured With: 30M Medipore H Soft Cloth Surgical Tape, 2x2 (in/yd) (Generic) 1 x Per Day/30 Days Radiology o X-ray, Chest - Take the order sheet and get a chest xray as soon as able (pt request 10/26_ - (ICD10 L97.316 - 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 A. Notify HBO physician and await physician orders. 2 If result is 70 mg/dl or below: B. If the result meets the hospital definition of a critical result, follow hospital policy. A. Give patient an 8 ounce Glucerna Shake, an 8 ounce Ensure, or 8 ounces of a Glucerna/Ensure equivalent  dietary supplement*. B. Wait 30 minutes. If  result is 71 mg/dl to 413 mg/dl: C. Retest patientos capillary blood glucose (CBG). D. If result greater than or equal to 110 mg/dl, proceed with HBO. If result less than 110 mg/dl, notify HBO physician and consider holding HBO. If result is 131 mg/dl to 244 mg/dl: A. Proceed with HBO. A. Notify HBO physician and await physician orders. B. It is recommended to hold HBO and do If result is 250 mg/dl or greater: blood/urine ketone testing. C. If the result meets the hospital definition of a critical result, follow hospital policy. POST-HBO GLYCEMIA INTERVENTIONS ACTION INTERVENTION Obtain post HBO capillary blood glucose (ensure 1 physician order is in chart). A. Notify HBO physician and await physician orders. 2 If result is 70 mg/dl or below: B. If the result meets the hospital definition of a critical result, follow hospital policy. A. Give patient an 8 ounce Glucerna Shake, an 8 ounce Ensure, or 8 ounces of a Glucerna/Ensure equivalent dietary supplement*. B. Wait 15 minutes for symptoms of hypoglycemia (i.e. nervousness, anxiety, If result is 71 mg/dl to 010 mg/dl: sweating, chills, clamminess, irritability, confusion, tachycardia or dizziness). C. If patient asymptomatic, discharge patient. If patient symptomatic, repeat capillary blood glucose (CBG) and notify HBO physician. If result is 101 mg/dl to 272 mg/dl: A. Discharge patient. If result is 250 mg/dl or greater: A. Notify HBO physician and await physician orders. [facility], [place]avious ([id number]) B. It is recommended to do blood/urine ketone testing. C. If the result meets the hospital definition of a critical result, follow hospital policy. *Juice or candies are NOT equivalent products. If patient refuses the Glucerna or Ensure, please consult the hospital dietitian for an appropriate substitute. Electronic Signature(s) Signed: 02/15/2021 11:39:17 AM By: Lenda Kelp PA-C Entered By: Lenda Kelp on 02/15/2021 11:39:16 [name] [name] ([id number]) -------------------------------------------------------------------------------- Problem List Details Patient Name: [name] [name] Date of Service: 02/15/2021 8:30 AM Medical Record Number: [id number] Patient Account Number: [id number] Date of Birth/Sex: 1940-11-10 (80 y.o. 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 without 01/03/2021 No Yes evidence of necrosis I10 Essential (primary) hypertension 01/03/2021 No Yes I48.0 Paroxysmal atrial fibrillation 01/03/2021 No Yes I50.42 Chronic combined systolic (congestive) and diastolic (congestive) heart 01/03/2021 No Yes failure Inactive Problems Resolved Problems Electronic Signature(s) Signed: 02/15/2021 9:39:16 AM By: Lenda Kelp PA-C Entered By: Lenda Kelp on 02/15/2021 09:39:16 [name] [name] ([id number]) -------------------------------------------------------------------------------- Progress Note Details Patient Name: [name] [name] Date of Service: 02/15/2021 8:30 AM Medical Record Number: [id number] Patient Account Number: [id number] Date of Birth/Sex: 09-Nov-1940 (80 y.o. Male) Treating RN: Hansel Feinstein Primary Care Provider: Lorenso Quarry Other Clinician: Referring Provider: Cheryll Dessert Treating Provider/Extender: Rowan Blase in Treatment: 6 Subjective Chief Complaint Information obtained from Patient Right ankle ulcer with osteomyelitis History of Present Illness (HPI) 01/03/2021 upon  evaluation today patient appears to be doing somewhat poorly in regard to wound on his right lateral ankle. This is quite significant based on what I see today. He tells me this is a site that back in 2006 he actually had a spider bite at this location that ended up requiring a fairly significant surgery to where he tells me that "the bone and  there looks like a log lying in a creek". Nonetheless this sounds like it was a fairly significant wound at that time. Nonetheless its been okay for a number of years until more recently where unfortunately on around 12/06/2020 this began to breakdown and development what we are seeing currently. There is a significant amount of necrotic tissue in the central portion of the wound. More importantly and concerning [name] his ABI is very low at 0.45 I am concerned that this is an arterial issue obviously he is diabetic and that could be playing a role as well but nonetheless I do believe we need to focus and see what we can do about getting him to vascular ASAP. The patient does have a history of diabetes mellitus type 2, hypertension, atrial fibrillation, and congestive heart failure. 01/10/2021 upon evaluation today patient appears to be doing well with regard to his wound. He has been tolerating the dressing changes without complication. The good news is the Levaquin does seem to be helping with overall clearing up the surface of the wound as well as the infection. We did do repeat fluorescence imaging today which showed that he had much less of the red fluorescence as compared to last week. This is great news. Still this was quite significant and still were not completely out of the water as far as bacterial load is concerned. 01/17/2021; patient with a large necrotic wound over the right lateral malleolus. Arterial studies from 9/22 showed an ABI of 0.46 biphasic waveforms at the PTA on the right. His TBI is 0.24 with a toe pressure of 32 mmHg. This is indicative of  severe PAD and possibly limb threatening ischemia. The patient does not describe a lot of pain except at night. He is a minimal ambulator. He is using been using Dakin's half-strength 01/24/2021 upon evaluation today patient's ankle ulcer actually starting to loosen up as far as the eschar is concerned. With that being said the patient still is having quite a bit of issues with a significant wound here. There does not appear to be any pain that is improved with the antibiotic treatment which is good news he is done with the antibiotics at this point. With that being said initially I had thought that we had sent him for an x-ray but he tells me that he did not get that form. Nonetheless I am going to send him for an x-ray of the ankle currently. Fortunately I think that the infection is significantly better by 1 to make sure there is no evidence of osteomyelitis or something more significant that would indicate that we are going to have to address this a little bit more aggressively. Patient voiced understanding. 10/10; this patient continues to have a necrotic wound on the right lateral malleolus. This is about the same as last time I saw this. He has is vein and vascular consult on 10/13. X-ray of the area did did not show osteomyelitis but I agree with an MRI that is currently ordered on 10/18. The wound does not look any worse than the last time I saw it but its not improved significant amount of necrotic debris. 02/07/2021 upon evaluation today patient's wound actually showing signs of still having a significant amount of necrotic tissue in the base of the wound. Again would not perform any extensive sharp debridement I do believe there is necrotic tendon noted in the base of the wound as well and again his arterial flow is very low. He is going be  having an arteriogram to try to see if they can open things up which I think is needed he definitely is at the critical limb ischemia situation at this  point. His ABIs on the right were 0.62 and on the left 0.87 and the TBI on the right was 0.23 with on the left was 0.26. Either way this is significant and I think he is going to need to be addressed as soon as possible vascular is already supposed to get in touch with scheduling to try to get him in for the procedure.Patient has his MRI tomorrow. 02/15/2021 upon evaluation today patient presents for follow-up evaluation regarding his right ankle ulcer. We do have several results to review today. I did actually look at his MRI which showed evidence of abnormal edema and endosteal enhancement laterally in the distal fibula compatible with osteomyelitis. There was also tenosynovitis noted proximally to the ulceration. Subsequently the patient did have subcutaneous edema on the dorsum of the foot along with medial ankle without definitive enhancement nonspecific for cellulitis versus infectious edema. Either way this seems to be related as well. I do believe that he likely does have an infection at this time that is prevalent both in the bone as well as in the soft tissues noted currently. With regard to the vascular evaluation and intervention he did have this on 02/10/2021 and this was a aortogram and selective right lower extremity angiogram. Subsequently of note the patient did have to have angioplasty on the segments down to the mid to distal posterior tibial artery to improve collateral flow distally. He also had occlusions that were still noted distally once that was completed. Subsequently the patient furthermore had balloon angioplasty of the ankle up to the origin of the anterior tibial artery to treat the long segment occlusion. Completion imaging showed excellent flow through the anterior tibial artery although circulation time remain slow there did not appear to be any greater than 20% residual stenosis which was good news. Lastly I did review the patient's echocardiogram as well this was  performed January of this year and showed an ejection fraction which was stated to be 55%. Overall I think this is compatible with him going forward with hyperbaric oxygen therapy. Nonetheless we will have to get a few other things taken care of as far as hyperbaric planning is concerned. [facility], Jerolyn Shin ([id number]) Objective Constitutional Well-nourished and well-hydrated in no acute distress. Vitals Time Taken: 8:41 AM, Height: 72 in, Weight: 178 lbs, BMI: 24.1, Temperature: 97.7 F, Pulse: 91 bpm, Respiratory Rate: 16 breaths/min, Blood Pressure: 92/61 mmHg. General Notes: stated he only drank half cup coffee today and water given at appt and encouraged to intake more non caffeine fluid each day Respiratory normal breathing without difficulty. Psychiatric this patient is able to make decisions and demonstrates good insight into disease process. Alert and Oriented x 3. pleasant and cooperative. General Notes: Upon inspection patient's wound bed still showed signs of necrotic tissue including necrotic muscle and a little bit of necrotic tendon noted as well as subcutaneous tissue that had to be debrided away. I performed a debridement today removing a vast majority of this down to the fibular region. I did not expose bone but it was very close and definitely some of the connective tissue/fascia covering was removed as it was nonviable. We are still not down to a completely clean wound surface but is getting much better. There still can be a significant amount of healing that is can have to take  place here. Being that he does have osteomyelitis I do believe that hyperbaric oxygen therapy would greatly support him in the healing process. This will help both with healing as well as helping out with the treatment of the osteomyelitis which is also a big deal here. Fortunately I think that this is something we should be able to gain approval for we just need to go through the appropriate  steps. Integumentary (Hair, Skin) Wound #1 status is Open. Original cause of wound was Gradually Appeared. The date acquired was: 12/06/2020. The wound has been in treatment 6 weeks. The wound is located on the Right,Lateral Malleolus. The wound measures 7cm length x 3cm width x 1.5cm depth; 16.493cm^2 area and 24.74cm^3 volume. There is bone, muscle, tendon, and Fat Layer (Subcutaneous Tissue) exposed. There is no tunneling noted, however, there is undermining starting at 2:00 and ending at 5:00 with a maximum distance of 0.7cm. There is a medium amount of serosanguineous drainage noted. Foul odor after cleansing was noted. The wound margin is distinct with the outline attached to the wound base. There is small (1-33%) red, hyper - granulation within the wound bed. There is a large (67-100%) amount of necrotic tissue within the wound bed including Eschar, Adherent Slough and Necrosis of Muscle. 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 There was a Excisional Skin/Subcutaneous Tissue/Muscle Debridement with a total area of 21 sq cm performed by [name] [name]., PA-C. With the following instrument(s): Forceps, and Scissors to remove Viable and Non-Viable tissue/material. Material removed includes Muscle, Eschar, Tendon, Subcutaneous Tissue, Slough, and Fascia after achieving pain control using Lidocaine. A time out was conducted at 09:19, prior to the start of  the procedure. A Minimum amount of bleeding was controlled with Pressure. The procedure was tolerated well. Post Debridement Measurements: 7cm length x 3cm width x 2cm depth; 32.987cm^3 volume. Character of Wound/Ulcer Post Debridement is improved. Severity of Tissue Post Debridement is: Bone involvement without necrosis. Post procedure Diagnosis Wound #1: Same as Pre-Procedure Plan [facility], Jerolyn Shin ([id number]) Follow-up Appointments: Return Appointment in 1 week. Other: - See your primary care physician about your low blood pressure and pain control Bathing/ Shower/ Hygiene: May shower with wound dressing protected with water repellent cover or cast protector. No tub bath. Edema Control - Lymphedema / Segmental Compressive Device / Other: Elevate leg(s) parallel to the floor when sitting. DO YOUR BEST to sleep in the bed at night. DO NOT sleep in your recliner. Long hours of sitting in a recliner leads to swelling of the legs and/or potential wounds on your backside. Off-Loading: Other: - keep pressure off of the wound/right ankle to promote healing Additional Orders / Instructions: Decrease/Stop Smoking - tobacco products not recommended Follow Nutritious Diet and Increase Protein Intake - increase protein intake; eat breakfast Other: - Check your blood sugar daily Hyperbaric Oxygen Therapy: Wound #1 Right,Lateral Malleolus: Evaluate for HBO Therapy Indication and location: - Osteomyelitis with Wag Grade 3 ulcer right ankle If appropriate for treatment, begin HBOT per protocol: 2.5 ATA for 90 Minutes with 2 Five (5) Minute Air Breaks One  treatment per day (delivered Monday through Friday unless otherwise specified in Special Instructions below): Total # of Treatments: - 40 Finger stick Blood Glucose Pre- and Post- HBOT Treatment. Follow Hyperbaric Oxygen Glycemia Protocol Medications-Please add to medication list.: Take one 500mg  Tylenol (Acetaminophen) and one 200mg  Motrin  (Ibuprofen) every 6 hours for pain. Do not take ibuprofen if you are on blood thinners or have stomach ulcers. - for pain as needed P.O. Antibiotics - Pick up and restart antibiotics as prescribed Radiology ordered were: X-ray, Chest - Take the order sheet and get a chest xray as soon as able (pt request 10/26_ Services and Therapies ordered were: Electrocardiogram (EKG) - They will call you with an appt, we will set it up The following medication(s) was prescribed: Levaquin oral 500 mg tablet 1 1 tablet oral taken 1 time per day for 30 days starting 02/15/2021 WOUND #1: - Malleolus Wound Laterality: Right, Lateral Secondary Dressing: ABD Pad 5x9 (in/in) (Generic) 1 x Per Day/30 Days Discharge Instructions: Cover with ABD pad Secondary Dressing: Conforming Guaze Roll-Large (Generic) 1 x Per Day/30 Days Discharge Instructions: Apply Conforming Stretch Guaze Bandage as directed Secured With: 41M Medipore H Soft Cloth Surgical Tape, 2x2 (in/yd) (Generic) 1 x Per Day/30 Days 1. Based on what I am seeing currently I do believe this patient would be a good candidate for hyperbaric oxygen therapy to try to get this healed both from the standpoint of the osteomyelitis as well as the actual wound itself as soon as possible to prevent this from worsening. Obviously the only other alternative would be standard wound care and hoping for the best or else potentially below-knee amputation which definitely does not want to consider. His son was present during the conversation today and they want to proceed with the HBO therapy. I do think that they feel like this will be absolutely appropriate. 2. Based on what I am seeing currently I do think that we should continue with a Dakin's moistened gauze dressing I think that still the best way to go once we get this clean the patient may benefit from a wound VAC we will discuss that in greater detail as it comes closer to that time depending on how things  appear. 3. I am pleased that he has better blood flow now I do think this is can make a big difference in his healing overall and I think the hyperbaric oxygen therapy will actually support this as well. 4. With regard to antibiotics I am going to actually recommend that we also go ahead and place him back on the antibiotic currently which was doing well for him that is the Levaquin I think that still good to be an excellent option at this point as well. The patient voiced understanding. This will be Levaquin. 5. I am also can recommend at this time that we have the patient go ahead and continue with the recommendation for an updated x-ray and EKG this will be an x-ray of the chest just to ensure everything appears to be doing well he does have normal findings on his echocardiogram with a ejection fraction of 55% which is good news. We will see patient back for reevaluation in 1 week here in the clinic. If anything worsens or changes patient will contact our office for additional recommendations. Electronic Signature(s) Signed: 02/15/2021 11:39:32 AM By: 02/17/2021 PA-C Entered By: 02/17/2021 on 02/15/2021 11:39:31 Lenda Kelp (02/17/2021) -------------------------------------------------------------------------------- SuperBill Details Patient Name: [name] [name] Date of Service: 02/15/2021  Medical Record Number: [id number] Patient Account Number: [id number] Date of Birth/Sex: [birth year] (80 y.o. 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 I10 Essential (primary)  hypertension I48.0 Paroxysmal atrial fibrillation I50.42 Chronic combined systolic (congestive) and diastolic (congestive) heart failure Facility Procedures CPT4 Code Description: [id number] 11043 - DEB MUSC/FASCIA 20 SQ CM/< Modifier: Quantity: 1 CPT4 Code Description: ICD-10 Diagnosis Description L97.316 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 without Modifier: evidence of necrosi Quantity: s CPT4 Code Description: [id number] 11046 - DEB MUSC/FASCIA EA ADDL 20 CM Modifier: Quantity: 1 CPT4 Code Description: ICD-10 Diagnosis Description L97.316 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 without Modifier: evidence of necrosi Quantity: s Physician Procedures CPT4 Code Description: [id number] - WC PHYS LEVEL 4 - EST PT Modifier: 25 Quantity: 1 CPT4 Code Description: ICD-10 Diagnosis Description M86.671 Other chronic osteomyelitis, right ankle and foot E11.622 Type 2 diabetes mellitus with other skin ulcer L97.316 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 without ev I10  Essential (primary) hypertension Modifier: idence of necrosi Quantity: s CPT4 Code Description: [id number] 11043 - WC PHYS DEBR MUSCLE/FASCIA 20 SQ CM Modifier: Quantity: 1 CPT4 Code Description: ICD-10 Diagnosis Description L97.316 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

## 2021-02-15 NOTE — Progress Notes (Signed)
YANI, LAL ([id number]) Visit Report for 02/15/2021 Arrival Information Details Patient Name: [name] [name], [name] [name] Date of Service: 02/15/2021 8:30 AM Medical Record Number: [id number] Patient Account Number: [id number] Date of Birth/Sex: 24-Sep-1940 (80 y.o. Male) Treating RN: Donnamarie Poag Primary Care Neithan Day: Toni Arthurs Other Clinician: Referring Dontavian Marchi: Alanson Aly Treating Kortlyn Koltz/Extender: Skipper Cliche in Treatment: 6 Visit Information History Since Last Visit Added or deleted any medications: No Patient Arrived: Wheel Chair Had a fall or experienced change in No Arrival Time: 08:35 activities of daily living that may affect Accompanied By: self risk of falls: Transfer Assistance: EasyPivot Patient Lift Hospitalized since last visit: No Patient Identification Verified: Yes Has Dressing in Place as Prescribed: Yes Secondary Verification Process Completed: Yes Has Footwear/Offloading in Place as Prescribed: Yes Patient Requires Transmission-Based No Right: Surgical Shoe with Pressure Relief Precautions: Insole Patient Has Alerts: Yes Pain Present Now: No Patient Alerts: Patient on Blood Thinner XARELTO Diabetic ABI left .87 TBI 0.26 ABI right .62 TBI 0.23 Electronic SignatureWebbs) Signed: 02/15/2021 4:40:41 PM By: Donnamarie Poag Entered By: Donnamarie Poag on 02/15/2021 08:41:59 [name] [name]) -------------------------------------------------------------------------------- Clinic Level of Care Assessment Details Patient Name: [name] [name] Date of Service: 02/15/2021 8:30 AM Medical Record Number: [id number] Patient Account Number: [id number] Date of Birth/Sex: 01-Sep-1940 (80 y.o. Male) Treating RN: Donnamarie Poag Primary Care Kevyn Boquet: Toni Arthurs Other Clinician: Referring Nassim Cosma: Alanson Aly Treating Jazman Reuter/Extender: Skipper Cliche in Treatment: 6 Clinic Level of Care Assessment Items TOOL 1 Quantity Score _0  - Use when EandM and  Procedure is performed on INITIAL visit 0 ASSESSMENTS - Nursing Assessment / Reassessment _1  - General Physical Exam (combine w/ comprehensive assessment (listed just below) when performed on new 0 pt. evals) _2  - 0 Comprehensive Assessment (HX, ROS, Risk Assessments, Wounds Hx, etc.) ASSESSMENTS - Wound and Skin Assessment / Reassessment _3  - Dermatologic / Skin Assessment (not related to wound area) 0 ASSESSMENTS - Ostomy and/or Continence Assessment and Care _4  - Incontinence Assessment and Management 0 _5  - 0 Ostomy Care Assessment and Management (repouching, etc.) PROCESS - Coordination of Care _6  - Simple Patient / Family Education for ongoing care 0 _7  - 0 Complex (extensive) Patient / Family Education for ongoing care _8  - 0 Staff obtains Programmer, systems, Records, Test Results / Process Orders _9  - 0 Staff telephones HHA, Nursing Homes / Clarify orders / etc _10  - 0 Routine Transfer to another Facility (non-emergent condition) _11  - 0 Routine Hospital Admission (non-emergent condition) _12  - 0 New Admissions / Biomedical engineer / Ordering NPWT, Apligraf, etc. _13  - 0 Emergency Hospital Admission (emergent condition) PROCESS - Special Needs _14  - Pediatric / Minor Patient Management 0 _15  - 0 Isolation Patient Management _16  - 0 Hearing / Language / Visual special needs _17  - 0 Assessment of Community assistance (transportation, D/C planning, etc.) _18  - 0 Additional assistance / Altered mentation _19  - 0 Support SurfaceWebbs) Assessment (bed, cushion, seat, etc.) INTERVENTIONS - Miscellaneous _20  - External ear exam 0 _21  - 0 Patient Transfer (multiple staff / Civil Service fast streamer / Similar devices) _22  - 0 Simple Staple / Suture removal (25 or less) _23  - 0 Complex Staple / Suture removal (26 or more) _24  - 0 Hypo/Hyperglycemic Management (do not check if billed separately) _25  - 0 Ankle / Brachial Index (ABI) - do not check if billed separately Has the patient been seen at the  hospital within the last three years: Yes Total Score: 0 Level Of Care: ____ [name] [name]) Electronic SignatureWebbs) Signed: 02/15/2021 4:40:41 PM By:  Bishop, Joy Entered By: Donnamarie Poag on 02/15/2021 09:24:59 [name] [name]) -------------------------------------------------------------------------------- Lower Extremity Assessment Details Patient Name: [name] [name], [name] [name] Date of Service: 02/15/2021 8:30 AM Medical Record Number: [id number] Patient Account Number: [id number] Date of Birth/Sex: 1940-10-12 (80 y.o. Male) Treating RN: Donnamarie Poag Primary Care Lilygrace Rodick: Toni Arthurs Other Clinician: Referring Analayah Brooke: Alanson Aly Treating Keddrick Wyne/Extender: Skipper Cliche in Treatment: 6 Edema Assessment Assessed: [Left: No] [Right: Yes] Edema: [Left: N] [Right: o] Calf Left: Right: Point of Measurement: 35 cm From Medial Instep 29 cm Ankle Left: Right: Point of Measurement: 8 cm From Medial Instep 23 cm Knee To Floor Left: Right: From Medial Instep 40 cm Vascular Assessment Pulses: Dorsalis Pedis Palpable: [Right:No Yes] Electronic SignatureWebbs) Signed: 02/15/2021 4:40:41 PM By: Donnamarie Poag Entered By: Donnamarie Poag on 02/15/2021 08:49:24 [name] [name], [name] [name] ([id number]) -------------------------------------------------------------------------------- Multi Wound Chart Details Patient Name: [name] [name] Date of Service: 02/15/2021 8:30 AM Medical Record Number: [id number] Patient Account Number: [id number] Date of Birth/Sex: [birth year] (80 y.o. Male) Treating RN: Donnamarie Poag Primary Care Eriq Hufford: Toni Arthurs Other Clinician: Referring Tashon Capp: Alanson Aly Treating Christofer Shen/Extender: Skipper Cliche in Treatment: 6 Vital Signs HeightWebbin): 72 PulseWebbbpm): 91 WeightWebblbs): 178 Blood PressureWebbmmHg): 92/61 Body Mass IndexWebbBMI): 24 TemperatureWebbF): 97.7 Respiratory RateWebbbreaths/min): 16 Photos: [N/A:N/A] Wound Location: Right, Lateral Malleolus  N/A N/A Wounding Event: Gradually Appeared N/A N/A Primary Etiology: Diabetic Wound/Ulcer of the Lower N/A N/A Extremity Comorbid History: Cataracts, Arrhythmia, Congestive N/A N/A Heart Failure, Hypertension, Type II Diabetes Date Acquired: 12/06/2020 N/A N/A Weeks of Treatment: 6 N/A N/A Wound Status: Open N/A N/A Measurements L x W x D (cm) 7x3x1.5 N/A N/A Area (cm) : 16.493 N/A N/A Volume (cm) : 24.74 N/A N/A % Reduction in Area: -104.70% N/A N/A % Reduction in Volume: -283.70% N/A N/A Starting Position 1 (o'clock): 2 Ending Position 1 (o'clock): 5 Maximum Distance 1 (cm): 0.7 Undermining: Yes N/A N/A Classification: Grade 3 N/A N/A Exudate Amount: Medium N/A N/A Exudate Type: Serosanguineous N/A N/A Exudate Color: red, brown N/A N/A Foul Odor After Cleansing: Yes N/A N/A Odor Anticipated Due to Product No N/A N/A Use: Granulation Amount: Small (1-33%) N/A N/A Granulation Quality: Red, Hyper-granulation N/A N/A Necrotic Amount: Large (67-100%) N/A N/A Necrotic Tissue: Eschar, Adherent Slough N/A N/A Exposed Structures: Fat Layer (Subcutaneous Tissue): N/A N/A Yes Tendon: Yes Bone: Yes Fascia: No Muscle: No Joint: No Epithelialization: None N/A N/A Treatment Notes [name] [name], [name] [name] ([id number]) Electronic SignatureWebbs) Signed: 02/15/2021 4:40:41 PM By: Donnamarie Poag Entered By: Donnamarie Poag on 02/15/2021 09:08:17 [name] [name]) -------------------------------------------------------------------------------- Multi-Disciplinary Care Plan Details Patient Name: [name] [name] Date of Service: 02/15/2021 8:30 AM Medical Record Number: [id number] Patient Account Number: [id number] Date of Birth/Sex: 04-28-1940 (80 y.o. Male) Treating RN: Donnamarie Poag Primary Care Trajon Rosete: Toni Arthurs Other Clinician: Referring Cherish Runde: Alanson Aly Treating Gottfried Standish/Extender: Skipper Cliche in Treatment: 6 Active Inactive Wound/Skin Impairment Nursing  Diagnoses: Impaired tissue integrity Knowledge deficit related to smoking impact on wound healing Knowledge deficit related to ulceration/compromised skin integrity Goals: Patient/caregiver will verbalize understanding of skin care regimen Date Initiated: 01/03/2021 Date Inactivated: 01/17/2021 Target Resolution Date: 01/13/2021 Goal Status: Met Ulcer/skin breakdown will have a volume reduction of 30% by week 4 Date Initiated: 01/03/2021 Date Inactivated: 01/31/2021 Target Resolution Date: 02/02/2021 Unmet Reason: AVVS consult/MRI Goal Status: Unmet ordered Ulcer/skin breakdown will have a volume reduction of 50% by week 8 Date Initiated: 01/03/2021 Target Resolution Date: 03/05/2021 Goal Status: Active Ulcer/skin breakdown will have a volume reduction of 80% by week 12 Date Initiated: 01/03/2021  Target Resolution Date: 04/04/2021 Goal Status: Active Ulcer/skin breakdown will heal within 14 weeks Date Initiated: 01/03/2021 Target Resolution Date: 04/18/2021 Goal Status: Active Interventions: Assess patient/caregiver ability to obtain necessary supplies Assess patient/caregiver ability to perform ulcer/skin care regimen upon admission and as needed Assess ulcerationWebbs) every visit Notes: Electronic SignatureWebbs) Signed: 02/15/2021 4:40:41 PM By: Donnamarie Poag Entered By: Donnamarie Poag on 02/15/2021 08:51:14 [name] [name]) -------------------------------------------------------------------------------- Pain Assessment Details Patient Name: [name] [name] Date of Service: 02/15/2021 8:30 AM Medical Record Number: [id number] Patient Account Number: [id number] Date of Birth/Sex: [birth year] (80 y.o. Male) Treating RN: Donnamarie Poag Primary Care Terrius Gentile: Toni Arthurs Other Clinician: Referring Severus Brodzinski: Alanson Aly Treating Orphia Mctigue/Extender: Skipper Cliche in Treatment: 6 Active Problems Location of Pain Severity and Description of Pain Patient Has Paino No Site  Locations Rate the pain. Current Pain Level: 0 Pain Management and Medication Current Pain Management: Electronic SignatureWebbs) Signed: 02/15/2021 4:40:41 PM By: Donnamarie Poag Entered By: Donnamarie Poag on 02/15/2021 08:43:35 [name] [name]) -------------------------------------------------------------------------------- Patient/Caregiver Education Details Patient Name: [name] [name] Date of Service: 02/15/2021 8:30 AM Medical Record Number: [id number] Patient Account Number: [id number] Date of Birth/Gender: Jan 08, 1941 (80 y.o. Male) Treating RN: Donnamarie Poag Primary Care Physician: Toni Arthurs Other Clinician: Referring Physician: Alanson Aly Treating Physician/Extender: Skipper Cliche in Treatment: 6 Education Assessment Education Provided To: Patient and Caregiver Education Topics Provided Basic Hygiene: Infection: Nutrition: Offloading: Wound Debridement: Wound/Skin Impairment: Electronic SignatureWebbs) Signed: 02/15/2021 4:40:41 PM By: Donnamarie Poag Entered By: Donnamarie Poag on 02/15/2021 09:08:41 [name] [name]) -------------------------------------------------------------------------------- Wound Assessment Details Patient Name: [name] [name] Date of Service: 02/15/2021 8:30 AM Medical Record Number: [id number] Patient Account Number: [id number] Date of Birth/Sex: 1940-09-13 (80 y.o. Male) Treating RN: Donnamarie Poag Primary Care Marice Angelino: Toni Arthurs Other Clinician: Referring Tawan Corkern: Alanson Aly Treating Bode Pieper/Extender: Skipper Cliche in Treatment: 6 Wound Status Wound Number: 1 Primary Diabetic Wound/Ulcer of the Lower Extremity Etiology: Wound Location: Right, Lateral Malleolus Wound Status: Open Wounding Event: Gradually Appeared Comorbid Cataracts, Arrhythmia, Congestive Heart Failure, Date Acquired: 12/06/2020 History: Hypertension, Type II Diabetes Weeks Of Treatment: 6 Clustered Wound: No Photos Wound  Measurements Length: (cm) 7 Width: (cm) 3 Depth: (cm) 1.5 Area: (cm) 16.493 Volume: (cm) 24.74 % Reduction in Area: -104.7% % Reduction in Volume: -283.7% Epithelialization: None Tunneling: No Undermining: Yes Starting Position (o'clock): 2 Ending Position (o'clock): 5 Maximum Distance: (cm) 0.7 Wound Description Classification: Grade 3 Wound Margin: Distinct, outline attached Exudate Amount: Medium Exudate Type: Serosanguineous Exudate Color: red, brown Foul Odor After Cleansing: Yes Due to Product Use: No Slough/Fibrino Yes Wound Bed Granulation Amount: Small (1-33%) Exposed Structure Granulation Quality: Red, Hyper-granulation Fascia Exposed: No Necrotic Amount: Large (67-100%) Fat Layer (Subcutaneous Tissue) Exposed: Yes Necrotic Quality: Eschar, Adherent Slough Tendon Exposed: Yes Muscle Exposed: Yes Necrosis of Muscle: Yes Joint Exposed: No Bone Exposed: Yes Electronic SignatureWebbs) Signed: 02/15/2021 9:41:14 AM By: Worthy Keeler PA-C Signed: 02/15/2021 4:40:41 PM By: Donnamarie Poag Entered By: Worthy Keeler on 02/15/2021 09:41:14 [name] [name]) Cruz Condon, [name] [name] ([id number]) -------------------------------------------------------------------------------- Vitals Details Patient Name: [name] [name] Date of Service: 02/15/2021 8:30 AM Medical Record Number: [id number] Patient Account Number: [id number] Date of Birth/Sex: May 04, 1940 (80 y.o. Male) Treating RN: Donnamarie Poag Primary Care [place]ett: Toni Arthurs Other Clinician: Referring Reon Hunley: Alanson Aly Treating Georgeanna Radziewicz/Extender: Skipper Cliche in Treatment: 6 Vital Signs Time Taken: 08:41 Temperature (F): 97.7 Height (in): 72 Pulse (bpm): 91 Weight (lbs): 178 Respiratory Rate (breaths/min): 16 Body Mass Index (BMI): 24.1 Blood Pressure (mmHg): 92/61 Reference Range:  80 - 120 mg / dl Notes stated he only drank half cup coffee today and water given at appt and encouraged to intake  more non caffeine fluid each day Electronic SignatureWebbs) Signed: 02/15/2021 4:40:41 PM By: Donnamarie Poag Entered ByDonnamarie Poag on 02/15/2021 08:42:47

## 2021-02-22 ENCOUNTER — Ambulatory Visit
Admission: RE | Admit: 2021-02-22 | Discharge: 2021-02-22 | Disposition: A | Discharge status: Home / Self Care | Payer: Medicare HMO | Source: Ambulatory Visit | Attending: Physician Assistant | Admitting: Physician Assistant

## 2021-02-22 ENCOUNTER — Ambulatory Visit
Admission: RE | Admit: 2021-02-22 | Discharge: 2021-02-22 | Disposition: A | Discharge status: Home / Self Care | Payer: Medicare HMO | Source: Home / Self Care | Attending: Physician Assistant | Admitting: Physician Assistant

## 2021-02-22 ENCOUNTER — Other Ambulatory Visit: Payer: Self-pay

## 2021-02-22 ENCOUNTER — Other Ambulatory Visit: Payer: Self-pay | Admitting: Physician Assistant

## 2021-02-22 ENCOUNTER — Encounter: Payer: Medicare HMO | Attending: Physician Assistant | Admitting: Physician Assistant

## 2021-02-22 DIAGNOSIS — L97316 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 without evidence of necrosis: Secondary | ICD-10-CM | POA: Insufficient documentation

## 2021-02-22 DIAGNOSIS — E1169 Type 2 diabetes mellitus with other specified complication: Secondary | ICD-10-CM | POA: Insufficient documentation

## 2021-02-22 DIAGNOSIS — S81801A Unspecified open wound, right lower leg, initial encounter: Secondary | ICD-10-CM | POA: Insufficient documentation

## 2021-02-22 DIAGNOSIS — X58XXXA Exposure to other specified factors, initial encounter: Secondary | ICD-10-CM | POA: Insufficient documentation

## 2021-02-22 DIAGNOSIS — M869 Osteomyelitis, unspecified: Secondary | ICD-10-CM | POA: Insufficient documentation

## 2021-02-22 DIAGNOSIS — I4891 Unspecified atrial fibrillation: Secondary | ICD-10-CM | POA: Diagnosis not present

## 2021-02-22 DIAGNOSIS — L97315 Non-pressure chronic ulcer of right ankle with muscle involvement without evidence of necrosis: Secondary | ICD-10-CM | POA: Diagnosis not present

## 2021-02-22 DIAGNOSIS — Z01818 Encounter for other preprocedural examination: Secondary | ICD-10-CM | POA: Diagnosis not present

## 2021-02-22 DIAGNOSIS — E11622 Type 2 diabetes mellitus with other skin ulcer: Secondary | ICD-10-CM | POA: Insufficient documentation

## 2021-02-22 NOTE — Progress Notes (Addendum)
LILY, KERNEN ([id number]) Visit Report for 02/22/2021 Arrival Information Details Patient Name: FINES, KIMBERLIN Date of Service: 02/22/2021 2:30 PM Medical Record Number: [id number] Patient Account Number: [id number] Date of Birth/Sex: 1940-09-11 (80 y.o. M) Treating RN: Donnamarie Poag Primary Care Breauna Mazzeo: Toni Arthurs Other Clinician: Referring Pedro Oldenburg: Toni Arthurs Treating Damian Hofstra/Extender: Skipper Cliche in Treatment: 7 Visit Information History Since Last Visit Added or deleted any medications: No Patient Arrived: Wheel Chair Had a fall or experienced change in No Arrival Time: 14:28 activities of daily living that may affect Accompanied By: son risk of falls: Transfer Assistance: EasyPivot Patient Lift Hospitalized since last visit: No Patient Identification Verified: Yes Has Dressing in Place as Prescribed: Yes Secondary Verification Process Completed: Yes Pain Present Now: No Patient Requires Transmission-Based No Precautions: Patient Has Alerts: Yes Patient Alerts: Patient on Blood Thinner XARELTO Diabetic ABI left .87 TBI 0.26 ABI right .62 TBI 0.23 Electronic Signature(s) Signed: 02/22/2021 4:15:58 PM By: Donnamarie Poag Entered By: Donnamarie Poag on 02/22/2021 14:36:18 [name] ([id number]) -------------------------------------------------------------------------------- Clinic Level of Care Assessment Details Patient Name: [name] Date of Service: 02/22/2021 2:30 PM Medical Record Number: [id number] Patient Account Number: [id number] Date of Birth/Sex: [birth year] (80 y.o. M) Treating RN: Donnamarie Poag Primary Care Omarri Eich: Toni Arthurs Other Clinician: Referring Artyom Stencel: Toni Arthurs Treating Aksh Swart/Extender: Skipper Cliche in Treatment: 7 Clinic Level of Care Assessment Items TOOL 1 Quantity Score []  - Use when EandM and Procedure is performed on INITIAL visit 0 ASSESSMENTS - Nursing Assessment / Reassessment []  - General Physical Exam  (combine w/ comprehensive assessment (listed just below) when performed on new 0 pt. evals) []  - 0 Comprehensive Assessment (HX, ROS, Risk Assessments, Wounds Hx, etc.) ASSESSMENTS - Wound and Skin Assessment / Reassessment []  - Dermatologic / Skin Assessment (not related to wound area) 0 ASSESSMENTS - Ostomy and/or Continence Assessment and Care []  - Incontinence Assessment and Management 0 []  - 0 Ostomy Care Assessment and Management (repouching, etc.) PROCESS - Coordination of Care []  - Simple Patient / Family Education for ongoing care 0 []  - 0 Complex (extensive) Patient / Family Education for ongoing care []  - 0 Staff obtains Programmer, systems, Records, Test Results / Process Orders []  - 0 Staff telephones HHA, Nursing Homes / Clarify orders / etc []  - 0 Routine Transfer to another Facility (non-emergent condition) []  - 0 Routine Hospital Admission (non-emergent condition) []  - 0 New Admissions / Biomedical engineer / Ordering NPWT, Apligraf, etc. []  - 0 Emergency Hospital Admission (emergent condition) PROCESS - Special Needs []  - Pediatric / Minor Patient Management 0 []  - 0 Isolation Patient Management []  - 0 Hearing / Language / Visual special needs []  - 0 Assessment of Community assistance (transportation, D/C planning, etc.) []  - 0 Additional assistance / Altered mentation []  - 0 Support Surface(s) Assessment (bed, cushion, seat, etc.) INTERVENTIONS - Miscellaneous []  - External ear exam 0 []  - 0 Patient Transfer (multiple staff / Civil Service fast streamer / Similar devices) []  - 0 Simple Staple / Suture removal (25 or less) []  - 0 Complex Staple / Suture removal (26 or more) []  - 0 Hypo/Hyperglycemic Management (do not check if billed separately) []  - 0 Ankle / Brachial Index (ABI) - do not check if billed separately Has the patient been seen at the hospital within the last three years: Yes Total Score: 0 Level Of Care: ____ [name] ([id number]) Electronic  Signature(s) Signed: 02/22/2021 4:15:58 PM By: Donnamarie Poag Entered By: Donnamarie Poag on 02/22/2021 14:47:58 [facility], Leane Para ([id number]) -------------------------------------------------------------------------------- Encounter  Discharge Information Details Patient Name: YUNIOR, JAIN Date of Service: 02/22/2021 2:30 PM Medical Record Number: [id number] Patient Account Number: [id number] Date of Birth/Sex: [birth year] (80 y.o. M) Treating RN: Donnamarie Poag Primary Care Kalia Vahey: Toni Arthurs Other Clinician: Referring Maryagnes Carrasco: Toni Arthurs Treating Abygale Karpf/Extender: Skipper Cliche in Treatment: 7 Encounter Discharge Information Items Post Procedure Vitals Discharge Condition: Stable Temperature (F): 97.8 Ambulatory Status: Wheelchair Pulse (bpm): 90 Discharge Destination: Home Respiratory Rate (breaths/min): 16 Transportation: Private Auto Blood Pressure (mmHg): 100/68 Accompanied By: son Schedule Follow-up Appointment: Yes Clinical Summary of Care: Electronic Signature(s) Signed: 02/22/2021 4:15:58 PM By: Donnamarie Poag Entered By: Donnamarie Poag on 02/22/2021 14:56:50 [name] ([id number]) -------------------------------------------------------------------------------- Lower Extremity Assessment Details Patient Name: [name] Date of Service: 02/22/2021 2:30 PM Medical Record Number: [id number] Patient Account Number: [id number] Date of Birth/Sex: 06/12/1940 (80 y.o. M) Treating RN: Donnamarie Poag Primary Care Rosalea Withrow: Toni Arthurs Other Clinician: Referring Jerard Bays: Toni Arthurs Treating Gwin Eagon/Extender: Jeri Cos Weeks in Treatment: 7 Edema Assessment Assessed: [Left: No] [Right: Yes] Edema: [Left: N] [Right: o] Knee To Floor Left: Right: From Medial Instep 40 cm Vascular Assessment Pulses: Dorsalis Pedis Palpable: [Right:Yes] Electronic Signature(s) Signed: 02/22/2021 4:15:58 PM By: Donnamarie Poag Entered By: Donnamarie Poag on 02/22/2021 14:43:43 [facility], Leane Para  ([id number]) -------------------------------------------------------------------------------- Multi Wound Chart Details Patient Name: [name] Date of Service: 02/22/2021 2:30 PM Medical Record Number: [id number] Patient Account Number: [id number] Date of Birth/Sex: 1940-09-15 (80 y.o. M) Treating RN: Donnamarie Poag Primary Care Canna Nickelson: Toni Arthurs Other Clinician: Referring Radwan Cowley: Toni Arthurs Treating Devani Odonnel/Extender: Jeri Cos Weeks in Treatment: 7 Vital Signs Height(in): 72 Pulse(bpm): 90 Weight(lbs): 178 Blood Pressure(mmHg): 100/68 Body Mass Index(BMI): 24 Temperature(F): 97.8 Respiratory Rate(breaths/min): 16 Photos: [N/A:N/A] Wound Location: Right, Lateral Malleolus N/A N/A Wounding Event: Gradually Appeared N/A N/A Primary Etiology: Diabetic Wound/Ulcer of the Lower N/A N/A Extremity Comorbid History: Cataracts, Arrhythmia, Congestive N/A N/A Heart Failure, Hypertension, Type II Diabetes Date Acquired: 12/06/2020 N/A N/A Weeks of Treatment: 7 N/A N/A Wound Status: Open N/A N/A Measurements L x W x D (cm) 6.7x3x1.8 N/A N/A Area (cm) : 15.787 N/A N/A Volume (cm) : 28.416 N/A N/A % Reduction in Area: -95.90% N/A N/A % Reduction in Volume: -340.80% N/A N/A Starting Position 1 (o'clock): 2 Ending Position 1 (o'clock): 4 Maximum Distance 1 (cm): 0.8 Undermining: Yes N/A N/A Classification: Grade 3 N/A N/A Exudate Amount: Medium N/A N/A Exudate Type: Serosanguineous N/A N/A Exudate Color: red, brown N/A N/A Foul Odor After Cleansing: Yes N/A N/A Odor Anticipated Due to Product No N/A N/A Use: Wound Margin: Distinct, outline attached N/A N/A Granulation Amount: Small (1-33%) N/A N/A Granulation Quality: Red, Hyper-granulation N/A N/A Necrotic Amount: Large (67-100%) N/A N/A Necrotic Tissue: Eschar, Adherent Slough N/A N/A Exposed Structures: Fat Layer (Subcutaneous Tissue): N/A N/A Yes Tendon: Yes Muscle: Yes Bone: Yes Fascia: No Joint:  No Epithelialization: None N/A N/A Treatment Notes REHMAN, LEVINSON ([id number]) Electronic Signature(s) Signed: 02/22/2021 4:15:58 PM By: Donnamarie Poag Entered By: Donnamarie Poag on 02/22/2021 14:44:39 [name] ([id number]) -------------------------------------------------------------------------------- Multi-Disciplinary Care Plan Details Patient Name: [name] Date of Service: 02/22/2021 2:30 PM Medical Record Number: [id number] Patient Account Number: [id number] Date of Birth/Sex: [birth year] (80 y.o. M) Treating RN: Donnamarie Poag Primary Care Rice Walsh: Toni Arthurs Other Clinician: Referring Javona Bergevin: Toni Arthurs Treating Brenee Gajda/Extender: Skipper Cliche in Treatment: 7 Active Inactive Wound/Skin Impairment Nursing Diagnoses: Impaired tissue integrity Knowledge deficit related to smoking impact on wound healing Knowledge deficit related to ulceration/compromised skin integrity Goals: Patient/caregiver will verbalize understanding of skin  care regimen Date Initiated: 01/03/2021 Date Inactivated: 01/17/2021 Target Resolution Date: 01/13/2021 Goal Status: Met Ulcer/skin breakdown will have a volume reduction of 30% by week 4 Date Initiated: 01/03/2021 Date Inactivated: 01/31/2021 Target Resolution Date: 02/02/2021 Unmet Reason: AVVS consult/MRI Goal Status: Unmet ordered Ulcer/skin breakdown will have a volume reduction of 50% by week 8 Date Initiated: 01/03/2021 Target Resolution Date: 03/05/2021 Goal Status: Active Ulcer/skin breakdown will have a volume reduction of 80% by week 12 Date Initiated: 01/03/2021 Target Resolution Date: 04/04/2021 Goal Status: Active Ulcer/skin breakdown will heal within 14 weeks Date Initiated: 01/03/2021 Target Resolution Date: 04/18/2021 Goal Status: Active Interventions: Assess patient/caregiver ability to obtain necessary supplies Assess patient/caregiver ability to perform ulcer/skin care regimen upon admission and as  needed Assess ulceration(s) every visit Notes: Electronic Signature(s) Signed: 02/22/2021 4:15:58 PM By: Donnamarie Poag Entered By: Donnamarie Poag on 02/22/2021 14:44:08 [name] ([id number]) -------------------------------------------------------------------------------- Pain Assessment Details Patient Name: [name] Date of Service: 02/22/2021 2:30 PM Medical Record Number: [id number] Patient Account Number: [id number] Date of Birth/Sex: [birth year] (80 y.o. M) Treating RN: Donnamarie Poag Primary Care Naoko Diperna: Toni Arthurs Other Clinician: Referring Elgene Coral: Toni Arthurs Treating Viveca Beckstrom/Extender: Skipper Cliche in Treatment: 7 Active Problems Location of Pain Severity and Description of Pain Patient Has Paino No Site Locations Rate the pain. Current Pain Level: 0 Pain Management and Medication Current Pain Management: Notes states pain is at night Electronic Signature(s) Signed: 02/22/2021 4:15:58 PM By: Donnamarie Poag Entered By: Donnamarie Poag on 02/22/2021 14:37:10 [name] ([id number]) -------------------------------------------------------------------------------- Patient/Caregiver Education Details Patient Name: [name] Date of Service: 02/22/2021 2:30 PM Medical Record Number: [id number] Patient Account Number: [id number] Date of Birth/Gender: [birth year] (80 y.o. M) Treating RN: Donnamarie Poag Primary Care Physician: Toni Arthurs Other Clinician: Referring Physician: Toni Arthurs Treating Physician/Extender: Skipper Cliche in Treatment: 7 Education Assessment Education Provided To: Patient Education Topics Provided Basic Hygiene: Hyperbaric Oxygenation: Methods: Demonstration, Explain/Verbal Infection: Offloading: Venous: Wound/Skin Impairment: Electronic Signature(s) Signed: 02/28/2021 4:21:52 PM By: Donnamarie Poag Previous Signature: 02/22/2021 4:15:58 PM Version By: Donnamarie Poag Entered By: Donnamarie Poag on 02/28/2021 16:20:46 [name]  ([id number]) -------------------------------------------------------------------------------- Wound Assessment Details Patient Name: [name] Date of Service: 02/22/2021 2:30 PM Medical Record Number: [id number] Patient Account Number: [id number] Date of Birth/Sex: 25-Apr-1940 (80 y.o. M) Treating RN: Donnamarie Poag Primary Care Orella Cushman: Toni Arthurs Other Clinician: Referring Sanjay Broadfoot: Toni Arthurs Treating Raffael Bugarin/Extender: Jeri Cos Weeks in Treatment: 7 Wound Status Wound Number: 1 Primary Diabetic Wound/Ulcer of the Lower Extremity Etiology: Wound Location: Right, Lateral Malleolus Wound Status: Open Wounding Event: Gradually Appeared Comorbid Cataracts, Arrhythmia, Congestive Heart Failure, Date Acquired: 12/06/2020 History: Hypertension, Type II Diabetes Weeks Of Treatment: 7 Clustered Wound: No Photos Wound Measurements Length: (cm) 6.7 % Re Width: (cm) 3 % Re Depth: (cm) 1.8 Epit Area: (cm) 15.787 Tun Volume: (cm) 28.416 Und S E M duction in Area: -95.9% duction in Volume: -340.8% helialization: None neling: No ermining: Yes tarting Position (o'clock): 2 nding Position (o'clock): 4 aximum Distance: (cm) 0.8 Wound Description Classification: Grade 3 Foul Wound Margin: Distinct, outline attached Due Exudate Amount: Medium Slou Exudate Type: Serosanguineous Exudate Color: red, brown Odor After Cleansing: Yes to Product Use: No gh/Fibrino Yes Wound Bed Granulation Amount: Small (1-33%) Exposed Structure Granulation Quality: Red, Hyper-granulation Fascia Exposed: No Necrotic Amount: Large (67-100%) Fat Layer (Subcutaneous Tissue) Exposed: Yes Necrotic Quality: Eschar, Adherent Slough Tendon Exposed: Yes Muscle Exposed: Yes Necrosis of Muscle: No Joint Exposed: No Bone Exposed: Yes Treatment Notes Wound #1 (Malleolus) Wound Laterality: Right,  Lateral [place] ([id number]) Cleanser Peri-Wound Care Topical Primary  Dressing Gauze Discharge Instruction: [place] As directed: dry, moistened with saline or moistened with Dakins Solution Secondary Dressing ABD Pad 5x9 (in/in) Discharge Instruction: Cover with ABD pad [place] Discharge Instruction: [place] as directed Secured With [employer] Surgical Tape, 2x2 (in/yd) Compression Wrap Compression Stockings Add-Ons Electronic Signature(s) Signed: 02/22/2021 4:15:58 PM By: Donnamarie Poag Entered By: Donnamarie Poag on 02/22/2021 14:42:42 [name] ([id number]) -------------------------------------------------------------------------------- [place] Details Patient Name: [name] Date of Service: 02/22/2021 2:30 PM Medical Record Number: [id number] Patient Account Number: [id number] Date of Birth/Sex: 06-May-1940 (80 y.o. M) Treating RN: Donnamarie Poag Primary Care Traci Gafford: Toni Arthurs Other Clinician: Referring Edras Wilford: Toni Arthurs Treating Ihan Pat/Extender: Jeri Cos Weeks in Treatment: 7 Vital Signs Time Taken: 14:36 Temperature (F): 97.8 Height (in): 72 Pulse (bpm): 90 Weight (lbs): 178 Respiratory Rate (breaths/min): 16 Body Mass Index (BMI): 24.1 Blood Pressure (mmHg): 100/68 Reference Range: 80 - 120 mg / dl Electronic Signature(s) Signed: 02/22/2021 4:15:58 PM By: Donnamarie Poag Entered ByDonnamarie Poag on 02/22/2021 14:36:56

## 2021-02-23 ENCOUNTER — Encounter: Payer: Medicare HMO | Admitting: Internal Medicine

## 2021-02-24 NOTE — Progress Notes (Addendum)
DEVAUN, HERNANDEZ ([id number]) Visit Report for 02/22/2021 Chief Complaint Document Details Patient Name: [name] [name], [name] [name] Date of Service: 02/22/2021 2:30 PM Medical Record Number: [id number] Patient Account Number: [id number] Date of Birth/Sex: Jan 06, 1941 (80 y.o. M) Treating RN: Hansel Feinstein Primary Care Provider: Lorenso Quarry Other Clinician: Referring Provider: Lorenso Quarry Treating Provider/Extender: Rowan Blase in Treatment: 7 Information Obtained from: Patient Chief Complaint Right ankle ulcer with osteomyelitis Electronic Signature(s) Signed: 02/22/2021 2:52:32 PM By: Lenda Kelp PA-C Entered By: Lenda Kelp on 02/22/2021 14:52:31 [name] [name] ([id number]) -------------------------------------------------------------------------------- Debridement Details Patient Name: [name] [name] Date of Service: 02/22/2021 2:30 PM Medical Record Number: [id number] Patient Account Number: [id number] Date of Birth/Sex: May 31, 1940 (80 y.o. M) Treating RN: Hansel Feinstein Primary Care Provider: Lorenso Quarry Other Clinician: Referring Provider: Lorenso Quarry Treating Provider/Extender: Allen Derry Weeks in Treatment: 7 Debridement Performed for Wound #1 Right,Lateral Malleolus Assessment: Performed By: Physician Nelida Meuse., PA-C Debridement Type: Debridement Severity of Tissue Pre Debridement: Muscle involvement without necrosis Level of Consciousness (Pre- Awake and Alert procedure): Pre-procedure Verification/Time Out Yes - 14:45 Taken: Start Time: 14:45 Pain Control: Lidocaine Total Area Debrided (L x W): 1 (cm) x 1 (cm) = 1 (cm) Tissue and other material Viable, Non-Viable, [place], [place] , [place] debrided: Level: Skin/Subcutaneous Tissue/Muscle Debridement Description: Excisional Instrument: Forceps, Scissors Bleeding: None Response to Treatment: Procedure was tolerated well Level of Consciousness (Post- Awake and Alert procedure): Post Debridement  Measurements of Total Wound Length: (cm) 6.7 Width: (cm) 3 Depth: (cm) 1.8 Volume: (cm) 28.416 Character of Wound/Ulcer Post Debridement: Improved Severity of Tissue Post Debridement: Muscle involvement without necrosis Post Procedure Diagnosis Same as Pre-procedure Electronic Signature(s) Signed: 02/22/2021 3:35:16 PM By: Lenda Kelp PA-C Signed: 02/22/2021 4:15:58 PM By: Hansel Feinstein Entered By: Lenda Kelp on 02/22/2021 15:35:15 [name] [name] ([id number]) -------------------------------------------------------------------------------- HPI Details Patient Name: [name] [name] Date of Service: 02/22/2021 2:30 PM Medical Record Number: [id number] Patient Account Number: [id number] Date of Birth/Sex: [birth year] (80 y.o. M) Treating RN: Hansel Feinstein Primary Care Provider: Lorenso Quarry Other Clinician: Referring Provider: Lorenso Quarry Treating Provider/Extender: Rowan Blase in Treatment: 7 History of Present Illness HPI Description: 01/03/2021 upon evaluation today patient appears to be doing somewhat poorly in regard to wound on his right lateral ankle. This is quite significant based on what I see today. He tells me this is a site that back in 2006 he actually had a spider bite at this location that ended up requiring a fairly significant surgery to where he tells me that "the bone and there looks like a log lying in a creek". Nonetheless this sounds like it was a fairly significant wound at that time. Nonetheless its been okay for a number of years until more recently where unfortunately on around 12/06/2020 this began to breakdown and development what we are seeing currently. There is a significant amount of necrotic tissue in the central portion of the wound. More importantly and concerning [name] [name] his ABI is very low at 0.45 I am concerned that this is an arterial issue obviously he is diabetic and that could be playing a role as well but nonetheless I do believe we need to  focus and see what we can do about getting him to vascular ASAP. The patient does have a history of diabetes mellitus type 2, hypertension, atrial fibrillation, and congestive heart failure. 01/10/2021 upon evaluation today patient appears to be doing well with regard to his wound. He has been tolerating the dressing changes without complication. The good  news is the Levaquin does seem to be helping with overall clearing up the surface of the wound as well as the infection. We did do repeat fluorescence imaging today which showed that he had much less of the red fluorescence as compared to last week. This is great news. Still this was quite significant and still were not completely out of the water as far as bacterial load is concerned. 01/17/2021; patient with a large necrotic wound over the right lateral malleolus. Arterial studies from 9/22 showed an ABI of 0.46 biphasic waveforms at the PTA on the right. His TBI is 0.24 with a toe pressure of 32 mmHg. This is indicative of severe PAD and possibly limb threatening ischemia. The patient does not describe a lot of pain except at night. He is a minimal ambulator. He is using been using Dakin's half-strength 01/24/2021 upon evaluation today patient's ankle ulcer actually starting to loosen up as far as the eschar is concerned. With that being said the patient still is having quite a bit of issues with a significant wound here. There does not appear to be any pain that is improved with the antibiotic treatment which is good news he is done with the antibiotics at this point. With that being said initially I had thought that we had sent him for an x-ray but he tells me that he did not get that form. Nonetheless I am going to send him for an x-ray of the ankle currently. Fortunately I think that the infection is significantly better by 1 to make sure there is no evidence of osteomyelitis or something more significant that would indicate that we are going to  have to address this a little bit more aggressively. Patient voiced understanding. 10/10; this patient continues to have a necrotic wound on the right lateral malleolus. This is about the same as last time I saw this. He has is vein and vascular consult on 10/13. X-ray of the area did did not show osteomyelitis but I agree with an MRI that is currently ordered on 10/18. The wound does not look any worse than the last time I saw it but its not improved significant amount of necrotic debris. 02/07/2021 upon evaluation today patient's wound actually showing signs of still having a significant amount of necrotic tissue in the base of the wound. Again would not perform any extensive sharp debridement I do believe there is necrotic tendon noted in the base of the wound as well and again his arterial flow is very low. He is going be having an arteriogram to try to see if they can open things up which I think is needed he definitely is at the critical limb ischemia situation at this point. His ABIs on the right were 0.62 and on the left 0.87 and the TBI on the right was 0.23 with on the left was 0.26. Either way this is significant and I think he is going to need to be addressed as soon as possible vascular is already supposed to get in touch with scheduling to try to get him in for the procedure.Patient has his MRI tomorrow. 02/15/2021 upon evaluation today patient presents for follow-up evaluation regarding his right ankle ulcer. We do have several results to review today. I did actually look at his MRI which showed evidence of abnormal edema and endosteal enhancement laterally in the distal fibula compatible with osteomyelitis. There was also tenosynovitis noted proximally to the ulceration. Subsequently the patient did have subcutaneous edema on the dorsum  of the foot along with medial ankle without definitive enhancement nonspecific for cellulitis versus infectious edema. Either way this seems to be  related as well. I do believe that he likely does have an infection at this time that is prevalent both in the bone as well as in the soft tissues noted currently. With regard to the vascular evaluation and intervention he did have this on 02/10/2021 and this was a aortogram and selective right lower extremity angiogram. Subsequently of note the patient did have to have angioplasty on the segments down to the mid to distal posterior tibial artery to improve collateral flow distally. He also had occlusions that were still noted distally once that was completed. Subsequently the patient furthermore had balloon angioplasty of the ankle up to the origin of the anterior tibial artery to treat the long segment occlusion. Completion imaging showed excellent flow through the anterior tibial artery although circulation time remain slow there did not appear to be any greater than 20% residual stenosis which was good news. Lastly I did review the patient's echocardiogram as well this was performed January of this year and showed an ejection fraction which was stated to be 55%. Overall I think this is compatible with him going forward with hyperbaric oxygen therapy. Nonetheless we will have to get a few other things taken care of as far as hyperbaric planning is concerned. 02/22/2021 upon evaluation today patient's wound is actually showing signs of improvement although he does have 1 area of deep tissue injury that had any concerns I discussed that with him today needs to be careful about keeping pressure off of this area. Otherwise he does have some additional granular growth which I think is good and the sooner we get him in the chamber the better he should be starting hyperbarics tomorrow assuming that he ends up doing well with the EKG and chest x-rays which were performed today. Electronic Signature(s) Signed: 02/22/2021 3:33:02 PM By: Lenda Kelp PA-C Entered By: Lenda Kelp on 02/22/2021  15:33:02 [name] [name] ([id number]) -------------------------------------------------------------------------------- Physical Exam Details Patient Name: [name] [name] Date of Service: 02/22/2021 2:30 PM Medical Record Number: [id number] Patient Account Number: [id number] Date of Birth/Sex: [birth year] (80 y.o. M) Treating RN: Hansel Feinstein Primary Care Provider: Lorenso Quarry Other Clinician: Referring Provider: Lorenso Quarry Treating Provider/Extender: Allen Derry Weeks in Treatment: 7 Constitutional Well-nourished and well-hydrated in no acute distress. Respiratory normal breathing without difficulty. Psychiatric this patient is able to make decisions and demonstrates good insight into disease process. Alert and Oriented x 3. pleasant and cooperative. Notes Upon inspection patient's wound bed actually showed signs of good granulation epithelization now that he has good blood flow we are seeing some definite improvement here and I am very pleased in that regard. Fortunately there does not appear to be any evidence of active infection systemically which is also great news. Electronic Signature(s) Signed: 02/22/2021 3:33:26 PM By: Lenda Kelp PA-C Entered By: Lenda Kelp on 02/22/2021 15:33:26 [name] [name] ([id number]) -------------------------------------------------------------------------------- Physician Orders Details Patient Name: [name] [name] Date of Service: 02/22/2021 2:30 PM Medical Record Number: [id number] Patient Account Number: [id number] Date of Birth/Sex: 07-Oct-1940 (80 y.o. M) Treating RN: Hansel Feinstein Primary Care Provider: Lorenso Quarry Other Clinician: Referring Provider: Lorenso Quarry Treating Provider/Extender: Rowan Blase in Treatment: 7 Verbal / Phone Orders: No Diagnosis Coding Follow-up Appointments o Return Appointment in 1 week. o Other: - See your primary care physician about your low blood pressure and pain control Bathing/ Shower/  Hygiene   o May shower with wound dressing protected with water repellent cover or cast protector. o No tub bath. Edema Control - Lymphedema / Segmental Compressive Device / Other o Elevate leg(s) parallel to the floor when sitting. o DO YOUR BEST to sleep in the bed at night. DO NOT sleep in your recliner. Long hours of sitting in a recliner leads to swelling of the legs and/or potential wounds on your backside. 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 o Follow Hyperbaric Oxygen Glycemia Protocol Medications-Please add to medication list. o Take one 500mg  Tylenol (Acetaminophen) and one 200mg  Motrin (Ibuprofen) every 6 hours for pain. Do not take ibuprofen if you are on blood thinners or have stomach ulcers. - for pain as needed o P.O. Antibiotics - Finish antibiotics as prescribed Wound Treatment Wound #1 - Malleolus Wound Laterality: Right, Lateral Primary Dressing: Gauze 1 x Per Day/30 Days Discharge Instructions: Dakins As directed: dry, moistened with saline or moistened with Dakins Solution Secondary Dressing: ABD Pad 5x9 (in/in) (Generic) 1 x Per Day/30 Days Discharge Instructions: Cover with ABD pad Secondary  Dressing: Conforming Guaze Roll-Large (Generic) 1 x Per Day/30 Days Discharge Instructions: Apply Conforming Stretch Guaze Bandage as directed Secured With: 94M Medipore H Soft Cloth Surgical Tape, 2x2 (in/yd) (Generic) 1 x Per Day/30 Days GLYCEMIA INTERVENTIONS PROTOCOL PRE-HBO GLYCEMIA INTERVENTIONS ACTION INTERVENTION 1 [name] [name], [name] [name] ([id number]) Obtain pre-HBO capillary blood glucose (ensure physician order is in chart). A. Notify HBO physician and await physician orders. 2 If result is 70 mg/dl or below: B. If the result meets the hospital definition of a critical result, follow hospital policy. A. Give patient an 8 ounce Glucerna Shake, an 8 ounce Ensure, or 8 ounces of a Glucerna/Ensure equivalent dietary supplement*. B. Wait 30 minutes. If result is 71 mg/dl to 552 mg/dl: C. Retest patientos capillary blood glucose (CBG). D. If result greater than or equal to 110 mg/dl, proceed with HBO. If result less than 110 mg/dl, notify HBO physician and consider holding HBO. If result is 131 mg/dl to 080 mg/dl: A. Proceed with HBO. A. Notify HBO physician and await physician orders. B. It is recommended to hold HBO and do If result is 250 mg/dl or greater: blood/urine ketone testing. C. If the result meets the hospital definition of a critical result, follow hospital policy. POST-HBO GLYCEMIA INTERVENTIONS ACTION INTERVENTION Obtain post HBO capillary blood glucose (ensure 1 physician order is in chart). A. Notify HBO physician and await physician orders. 2 If result is 70 mg/dl or below: B. If the result meets the hospital definition of a critical result, follow hospital policy. A. Give patient an 8 ounce Glucerna Shake, an 8 ounce Ensure, or 8 ounces of a Glucerna/Ensure equivalent dietary supplement*. B. Wait 15 minutes for symptoms of hypoglycemia (i.e. nervousness, anxiety, If result is 71 mg/dl to  100 mg/dl: sweating, chills, clamminess,  irritability, confusion, tachycardia or dizziness). C. If patient asymptomatic, discharge patient. If patient symptomatic, repeat capillary blood glucose (CBG) and notify HBO physician. If result is 101 mg/dl to 093 mg/dl: A. Discharge patient. A. Notify HBO physician and await physician orders. B. It is recommended to do blood/urine If result is 250 mg/dl or greater: ketone testing. C. If the result meets the hospital definition of a critical result, follow hospital policy. *Juice or candies are NOT equivalent products. If patient refuses the Glucerna or Ensure, please consult the hospital dietitian for an appropriate substitute. Electronic Signature(s) Signed: 02/28/2021 8:26:40 AM By: Hansel Feinstein Signed: 02/28/2021 4:18:05 PM By: Lenda Kelp PA-C Previous Signature: 02/22/2021 4:15:58 PM Version By: Hansel Feinstein Previous Signature: 02/23/2021 6:58:05 PM Version By: Lenda Kelp PA-C Entered By: Hansel Feinstein on 02/28/2021 08:26:11 [name] [name] ([id number]) -------------------------------------------------------------------------------- Problem List Details Patient Name: [name] [name] Date of Service: 02/22/2021 2:30 PM Medical Record Number: [id number] Patient Account Number: [id number] Date of Birth/Sex: Aug 31, 1940 (80 y.o. 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 without 01/03/2021 No Yes evidence of necrosis I10 Essential (primary) hypertension 01/03/2021 No Yes I48.0 Paroxysmal atrial fibrillation 01/03/2021 No Yes 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 M) Treating RN: Hansel Feinstein Primary Care Provider: Lorenso Quarry Other Clinician: Referring Provider: Lorenso Quarry Treating Provider/Extender: Rowan Blase in Treatment: 7 Subjective Chief Complaint Information obtained from Patient Right ankle ulcer with osteomyelitis History of Present Illness (HPI) 01/03/2021 upon evaluation today patient appears to be doing somewhat poorly in regard to wound on his right lateral ankle. This is quite significant based on what I see today. He tells me this is a site that back in 2006 he actually had a spider bite at this location that ended up requiring a fairly significant surgery to where he tells me that "the bone and there looks like a log lying in a creek". Nonetheless this sounds like it was a fairly significant wound at that time. Nonetheless its been okay for a number of years until more recently where unfortunately on around 12/06/2020 this began to breakdown and development what we are seeing currently. There is a significant amount of necrotic tissue in the central portion of the wound. More importantly and concerning [name] [name] his ABI is very low at 0.45 I am concerned that this is an arterial issue obviously he is diabetic and that could be playing a role as well but nonetheless I do believe we need to focus and see what we can do about getting him to vascular ASAP. The patient does have a history of  diabetes mellitus type 2, hypertension, atrial fibrillation, and congestive heart failure. 01/10/2021 upon evaluation today patient appears to be doing well with regard to his wound. He has been tolerating  the dressing changes without complication. The good news is the Levaquin does seem to be helping with overall clearing up the surface of the wound as well as the infection. We did do repeat fluorescence imaging today which showed that he had much less of the red fluorescence as compared to last week. This is great news. Still this was quite significant and still were not completely out of the water as far as bacterial load is concerned. 01/17/2021; patient with a large necrotic wound over the right lateral malleolus. Arterial studies from 9/22 showed an ABI of 0.46 biphasic waveforms at the PTA on the right. His TBI is 0.24 with a toe pressure of 32 mmHg. This is indicative of severe PAD and possibly limb threatening ischemia. The patient does not describe a lot of pain except at night. He is a minimal ambulator. He is using been using Dakin's half-strength 01/24/2021 upon evaluation today patient's ankle ulcer actually starting to loosen up as far as the eschar is concerned. With that being said the patient still is having quite a bit of issues with a significant wound here. There does not appear to be any pain that is improved with the antibiotic treatment which is good news he is done with the antibiotics at this point. With that being said initially I had thought that we had sent him for an x-ray but he tells me that he did not get that form. Nonetheless I am going to send him for an x-ray of the ankle currently. Fortunately I think that the infection is significantly better by 1 to make sure there is no evidence of osteomyelitis or something more significant that would indicate that we are going to have to address this a little bit more aggressively. Patient voiced understanding. 10/10; this  patient continues to have a necrotic wound on the right lateral malleolus. This is about the same as last time I saw this. He has is vein and vascular consult on 10/13. X-ray of the area did did not show osteomyelitis but I agree with an MRI that is currently ordered on 10/18. The wound does not look any worse than the last time I saw it but its not improved significant amount of necrotic debris. 02/07/2021 upon evaluation today patient's wound actually showing signs of still having a significant amount of necrotic tissue in the base of the wound. Again would not perform any extensive sharp debridement I do believe there is necrotic tendon noted in the base of the wound as well and again his arterial flow is very low. He is going be having an arteriogram to try to see if they can open things up which I think is needed he definitely is at the critical limb ischemia situation at this point. His ABIs on the right were 0.62 and on the left 0.87 and the TBI on the right was 0.23 with on the left was 0.26. Either way this is significant and I think he is going to need to be addressed as soon as possible vascular is already supposed to get in touch with scheduling to try to get him in for the procedure.Patient has his MRI tomorrow. 02/15/2021 upon evaluation today patient presents for follow-up evaluation regarding his right ankle ulcer. We do have several results to review today. I did actually look at his MRI which showed evidence of abnormal edema and endosteal enhancement laterally in the distal fibula compatible with osteomyelitis. There was also tenosynovitis noted proximally to the ulceration. Subsequently the patient  did have subcutaneous edema on the dorsum of the foot along with medial ankle without definitive enhancement nonspecific for cellulitis versus infectious edema. Either way this seems to be related as well. I do believe that he likely does have an infection at this time that is prevalent  both in the bone as well as in the soft tissues noted currently. With regard to the vascular evaluation and intervention he did have this on 02/10/2021 and this was a aortogram and selective right lower extremity angiogram. Subsequently of note the patient did have to have angioplasty on the segments down to the mid to distal posterior tibial artery to improve collateral flow distally. He also had occlusions that were still noted distally once that was completed. Subsequently the patient furthermore had balloon angioplasty of the ankle up to the origin of the anterior tibial artery to treat the long segment occlusion. Completion imaging showed excellent flow through the anterior tibial artery although circulation time remain slow there did not appear to be any greater than 20% residual stenosis which was good news. Lastly I did review the patient's echocardiogram as well this was performed January of this year and showed an ejection fraction which was stated to be 55%. Overall I think this is compatible with him going forward with hyperbaric oxygen therapy. Nonetheless we will have to get a few other things taken care of as far as hyperbaric planning is concerned. 02/22/2021 upon evaluation today patient's wound is actually showing signs of improvement although he does have 1 area of deep tissue injury that had any concerns I discussed that with him today needs to be careful about keeping pressure off of this area. Otherwise he does have some additional granular growth which I think is good and the sooner we get him in the chamber the better he should be starting hyperbarics tomorrow assuming that he ends up doing well with the EKG and chest x-rays which were performed today. [facility], Jerolyn Shin ([id number]) Objective Constitutional Well-nourished and well-hydrated in no acute distress. Vitals Time Taken: 2:36 PM, Height: 72 in, Weight: 178 lbs, BMI: 24.1, Temperature: 97.8 F, Pulse: 90 bpm, Respiratory  Rate: 16 breaths/min, Blood Pressure: 100/68 mmHg. Respiratory normal breathing without difficulty. Psychiatric this patient is able to make decisions and demonstrates good insight into disease process. Alert and Oriented x 3. pleasant and cooperative. General Notes: Upon inspection patient's wound bed actually showed signs of good granulation epithelization now that he has good blood flow we are seeing some definite improvement here and I am very pleased in that regard. Fortunately there does not appear to be any evidence of active infection systemically which is also great news. Integumentary (Hair, Skin) Wound #1 status is Open. Original cause of wound was Gradually Appeared. The date acquired was: 12/06/2020. The wound has been in treatment 7 weeks. The wound is located on the Right,Lateral Malleolus. The wound measures 6.7cm length x 3cm width x 1.8cm depth; 15.787cm^2 area and 28.416cm^3 volume. There is bone, muscle, tendon, and Fat Layer (Subcutaneous Tissue) exposed. There is no tunneling noted, however, there is undermining starting at 2:00 and ending at 4:00 with a maximum distance of 0.8cm. There is a medium amount of serosanguineous drainage noted. Foul odor after cleansing was noted. The wound margin is distinct with the outline attached to the wound base. There is small (1-33%) red, hyper - granulation within the wound bed. There is a large (67-100%) amount of necrotic tissue within the wound bed including Eschar and Adherent Slough. 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 There was a Excisional Skin/Subcutaneous Tissue/Muscle Debridement with a total area of 1 sq cm performed by Nelida Meuse., PA-C. With the following instrument(s): Forceps, and Scissors to remove Viable and Non-Viable tissue/material. Material removed includes Slough and Fascia and after achieving pain control using Lidocaine. A time out was conducted at 14:45, prior to the start of the procedure. There was no bleeding. The procedure was tolerated well. Post Debridement Measurements: 6.7cm length x 3cm width x 1.8cm depth; 28.416cm^3 volume. Character of Wound/Ulcer Post Debridement is improved. Severity of Tissue Post Debridement is: Muscle involvement without necrosis. Post procedure Diagnosis Wound #1: Same as Pre-Procedure Plan Follow-up Appointments: Return Appointment in 1 week. Other: - See your primary care physician about your low blood pressure and pain control Perras, [name] [name] ([id number]) Bathing/ Shower/ Hygiene: May shower with wound dressing protected with water repellent cover or cast protector. No tub bath. Edema Control - Lymphedema / Segmental Compressive Device / Other: Elevate leg(s) parallel to the floor when sitting. DO YOUR BEST to sleep in the bed at night. DO NOT sleep in your recliner. Long hours of sitting in a recliner leads to swelling of the legs and/or potential wounds on your backside. Off-Loading: Other: - keep pressure off of the wound/right ankle to promote healing Additional Orders / Instructions: Decrease/Stop Smoking - tobacco products not recommended Follow Nutritious Diet and Increase Protein Intake - increase protein intake; eat breakfast Other: - Check your blood sugar daily Hyperbaric Oxygen Therapy: Wound #1 Right,Lateral Malleolus: Evaluate for HBO Therapy Indication and location: - Osteomyelitis with Wag  Grade 3 ulcer right ankle If appropriate for treatment, begin HBOT per protocol: 2.5 ATA for 90 Minutes with 2 Five (5) Minute Air Breaks One treatment per day (delivered Monday through Friday unless otherwise specified in Special Instructions below): Total # of Treatments: - 40 Finger stick Blood Glucose Pre- and Post- HBOT Treatment. Follow Hyperbaric Oxygen Glycemia Protocol Medications-Please add to medication list.: Take one 500mg  Tylenol (Acetaminophen) and one 200mg  Motrin (Ibuprofen) every 6 hours for pain. Do not take ibuprofen if you are on blood thinners or have stomach ulcers. - for pain as needed P.O. Antibiotics - Finish antibiotics as prescribed WOUND #1: - Malleolus Wound Laterality: Right, Lateral Primary Dressing: Gauze 1 x Per Day/30 Days Discharge Instructions: Dakins As directed: dry, moistened with saline or moistened with Dakins Solution Secondary Dressing: ABD Pad 5x9 (in/in) (Generic) 1 x Per Day/30 Days Discharge Instructions: Cover with ABD pad Secondary Dressing: Conforming Guaze Roll-Large (Generic) 1 x Per Day/30 Days Discharge Instructions: Apply Conforming Stretch Guaze Bandage as directed Secured With: 65M Medipore H Soft Cloth Surgical Tape, 2x2 (in/yd) (Generic) 1 x Per Day/30 Days 1. Would recommend currently that we go ahead and continue with the Dakin's moistened gauze dressing I think that is probably still the best way to go based on what I am seeing currently. 2. Also think this patient could be a candidate for TheraSkin as a option for him, to see how things do especially with him started hyperbarics as well  as cleaning up the surface of the wound in general but I think TheraSkin may be an excellent option to help cover over the bone and tissue here to try to get some more rapid epithelization and growth. A wound VAC is also a possibility both which we have discussed before waiting to see how things go over the next week to 2 weeks. We will see  patient back for reevaluation in 1 week here in the clinic. If anything worsens or changes patient will contact our office for additional recommendations. The patient pending a good x-ray on the chest and EKG will be started hyperbarics tomorrow. Electronic Signature(s) Signed: 02/22/2021 3:35:33 PM By: Lenda Kelp PA-C Previous Signature: 02/22/2021 3:34:20 PM Version By: Lenda Kelp PA-C Entered By: Lenda Kelp on 02/22/2021 15:35:33 [name] [name] ([id number]) -------------------------------------------------------------------------------- SuperBill Details Patient Name: [name] [name] Date of Service: 02/22/2021 Medical Record Number: [id number] Patient Account Number: [id number] Date of Birth/Sex: 23-Jul-1940 (80 y.o. 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 I10 Essential (primary) hypertension I48.0 Paroxysmal atrial fibrillation I50.42 Chronic combined systolic (congestive) and diastolic (congestive) heart failure Facility Procedures CPT4 Code Description: [id number] 11043 - DEB MUSC/FASCIA 20 SQ CM/< Modifier: Quantity: 1 CPT4 Code Description: ICD-10 Diagnosis Description L97.316 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 without Modifier: evidence of necrosi Quantity: s Physician Procedures CPT4 Code Description: [id number] 99214 - WC PHYS LEVEL 4 - EST PT Modifier: 25 Quantity: 1 CPT4 Code Description: ICD-10 Diagnosis Description M86.671 Other chronic osteomyelitis, right ankle and foot E11.622 Type 2 diabetes mellitus with other skin ulcer L97.316  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

## 2021-02-28 ENCOUNTER — Other Ambulatory Visit: Payer: Self-pay

## 2021-02-28 ENCOUNTER — Encounter: Payer: Medicare HMO | Admitting: Physician Assistant

## 2021-02-28 DIAGNOSIS — L97316 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 without evidence of necrosis: Secondary | ICD-10-CM | POA: Diagnosis not present

## 2021-02-28 DIAGNOSIS — M86671 Other chronic osteomyelitis, right ankle and foot: Secondary | ICD-10-CM | POA: Diagnosis not present

## 2021-02-28 DIAGNOSIS — E1169 Type 2 diabetes mellitus with other specified complication: Secondary | ICD-10-CM | POA: Diagnosis not present

## 2021-02-28 DIAGNOSIS — M869 Osteomyelitis, unspecified: Secondary | ICD-10-CM | POA: Diagnosis not present

## 2021-02-28 DIAGNOSIS — E11622 Type 2 diabetes mellitus with other skin ulcer: Secondary | ICD-10-CM | POA: Diagnosis not present

## 2021-02-28 LAB — GLUCOSE, CAPILLARY
Glucose-Capillary: 178 mg/dL — ABNORMAL HIGH (ref 70–99)
Glucose-Capillary: 80 mg/dL (ref 70–99)
Glucose-Capillary: 94 mg/dL (ref 70–99)

## 2021-02-28 NOTE — Progress Notes (Signed)
SHADRICK, SENNE ([id number]) Visit Report for 02/28/2021 Arrival Information Details Patient Name: [name] [name], [name] [name] Date of Service: 02/28/2021 8:00 AM Medical Record Number: [id number] Patient Account Number: [id number] Date of Birth/Sex: 10-Apr-1941 (80 y.o. M) Treating RN: Hansel Feinstein Primary Care Marylee Belzer: Lorenso Quarry Other Clinician: Izetta Dakin Referring Derrek Puff: Lorenso Quarry Treating Baani Bober/Extender: Rowan Blase in Treatment: 8 Visit Information History Since Last Visit Added or deleted any medications: No Patient Arrived: Wheel Chair Any new allergies or adverse reactions: No Arrival Time: 08:15 Had a fall or experienced change in No Accompanied By: wife activities of daily living that may affect Transfer Assistance: Manual risk of falls: Patient Identification Verified: Yes Signs or symptoms of abuse/neglect since last visito No Secondary Verification Process Completed: Yes Implantable device outside of the clinic excluding No Patient Requires Transmission-Based No cellular tissue based products placed in the center Precautions: since last visit: Patient Has Alerts: Yes Has Dressing in Place as Prescribed: Yes Patient Alerts: Patient on Blood Pain Present Now: Yes Thinner XARELTO Diabetic ABI left .87 TBI 0.26 ABI right .62 TBI 0.23 Electronic Signature(s) Signed: 02/28/2021 1:27:14 PM By: Aleda Grana RCP, RRT, CHT Entered By: Aleda Grana on 02/28/2021 08:49:46 [name] [name] ([id number]) -------------------------------------------------------------------------------- Encounter Discharge Information Details Patient Name: [name] [name] Date of Service: 02/28/2021 8:00 AM Medical Record Number: [id number] Patient Account Number: [id number] Date of Birth/Sex: January 04, 1941 (80 y.o. M) Treating RN: Hansel Feinstein Primary Care Yakir Wenke: Lorenso Quarry Other Clinician: Izetta Dakin Referring Jaymison Luber: Lorenso Quarry Treating  Lorrie Strauch/Extender: Rowan Blase in Treatment: 8 Encounter Discharge Information Items Discharge Condition: Stable Ambulatory Status: Wheelchair Discharge Destination: Home Transportation: Private Auto Accompanied By: wife Schedule Follow-up Appointment: Yes Clinical Summary of Care: Notes Patient has an HBO treatment scheduled on 03/01/21 at 08:00 am. Electronic Signature(s) Signed: 02/28/2021 1:27:14 PM By: Aleda Grana RCP, RRT, CHT Entered By: Aleda Grana on 02/28/2021 13:23:07 [name] [name] ([id number]) -------------------------------------------------------------------------------- Vitals Details Patient Name: [name] [name] Date of Service: 02/28/2021 8:00 AM Medical Record Number: [id number] Patient Account Number: [id number] Date of Birth/Sex: 1940-05-02 (80 y.o. M) Treating RN: Hansel Feinstein Primary Care Ryker Pherigo: Lorenso Quarry Other Clinician: Izetta Dakin Referring Jesstin Studstill: Lorenso Quarry Treating Nevaya Nagele/Extender: Allen Derry Weeks in Treatment: 8 Vital Signs Time Taken: 08:05 Temperature (F): 98.9 Height (in): 72 Pulse (bpm): 90 Weight (lbs): 178 Respiratory Rate (breaths/min): 18 Body Mass Index (BMI): 24.1 Blood Pressure (mmHg): 128/70 Capillary Blood Glucose (mg/dl): 268 Reference Range: 80 - 120 mg / dl Electronic Signature(s) Signed: 02/28/2021 1:27:14 PM By: Aleda Grana RCP, RRT, CHT Entered By: Aleda Grana on 02/28/2021 08:51:20

## 2021-02-28 NOTE — Progress Notes (Signed)
CAROLE, DEERE ([id number]) Visit Report for 02/28/2021 HBO Details Patient Name: [name] [name], [name] [name] Date of Service: 02/28/2021 8:00 AM Medical Record Number: [id number] Patient Account Number: [id number] Date of Birth/Sex: February 12, 1941 (80 y.o. 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 HBO Treatment Details Treatment Number: 1 Patient Type: Outpatient Chamber Type: Monoplace Chamber Serial #: [id number] Treatment Protocol: 2.0 ATA with 90 minutes oxygen, and no air breaks Treatment Details Compression Rate Down: 1.0 psi / minute De-Compression Rate Up: 1.5 psi / minute Compress Tx Pressure Air breaks and breathing periods Decompress Decompress Begins Reached (leave unused spaces blank) Begins Ends Chamber Pressure (ATA) 1 2 - - - - - - 2 1 Clock Time (24 hr) 08:39 08:55 - - - - - - 10:25 10:35 Treatment Length: 116 (minutes) Treatment Segments: 4 Vital Signs Capillary Blood Glucose Reference Range: 80 - 120 mg / dl HBO Diabetic Blood Glucose Intervention Range: <131 mg/dl or >885 mg/dl Time Capillary Blood Glucose Pulse Blood Respiratory Action Type: Vitals Pulse: Temperature: Glucose Meter Oximetry Pressure: Rate: Taken: Taken: (mg/dl): #: (%) Pre 02:77 412/87 90 18 98.9 178 1 none per protocol Gave Ensure per protocol; will Post 10:46 104/78 90 18 98.2 80 1 recheck Post 11:12 94 1 Released to home Treatment Response Treatment Toleration: Well Treatment Completion Treatment Completed without Adverse Event Status: Electronic Signature(s) Signed: 02/28/2021 1:27:14 PM By: Aleda Grana  RCP, RRT, CHT Signed: 02/28/2021 4:18:05 PM By: Lenda Kelp PA-C Entered By: Aleda Grana on 02/28/2021 13:22:03 [name] [name] ([id number]) -------------------------------------------------------------------------------- HBO Safety Checklist Details Patient Name: [name] [name] Date of Service: 02/28/2021 8:00 AM Medical Record Number: [id number] Patient Account Number: [id number] Date of Birth/Sex: 04-Jul-1940 (80 y.o. M) Treating RN: Hansel Feinstein Primary Care Phelan Goers: Lorenso Quarry Other Clinician: Izetta Dakin Referring Noely Kuhnle: Lorenso Quarry Treating Nicolette Gieske/Extender: Allen Derry Weeks in Treatment: 8 HBO Safety Checklist Items Safety Checklist Consent Form Signed Patient voided / foley secured and emptied When did you last eato 06:30 am Last dose of injectable or oral agent n/a Ostomy pouch emptied and vented if applicable NA All implantable devices assessed, documented and approved NA Intravenous access site secured and place NA Valuables secured Linens and cotton and cotton/polyester blend (less than 51% polyester) Personal oil-based products / skin lotions / body lotions removed Wigs or hairpieces removed NA Smoking or tobacco materials removed NA Books / newspapers / magazines / loose paper removed NA Cologne, aftershave, perfume and deodorant removed Jewelry removed (may wrap wedding band) Make-up removed NA Hair care products removed Battery operated devices (external) removed NA Heating patches and chemical warmers removed NA Titanium eyewear removed NA Nail polish cured greater than 10 hours NA Casting material cured greater than 10 hours NA Hearing aids removed NA Loose dentures or partials removed NA Prosthetics have been removed NA Patient demonstrates correct use of air break device (if applicable) Patient concerns have been addressed Patient grounding bracelet on and cord attached to chamber Specifics for Inpatients  (complete in addition to above) Medication sheet sent with patient Intravenous medications needed or due during therapy sent with patient Drainage tubes (e.g. nasogastric tube or chest tube secured and vented) Endotracheal or Tracheotomy tube secured Cuff deflated of air and inflated with  saline Airway suctioned Electronic Signature(s) Signed: 02/28/2021 1:27:14 PM By: Aleda Grana RCP, RRT, CHT Entered By: Aleda Grana on 02/28/2021 08:53:20

## 2021-03-01 ENCOUNTER — Encounter: Payer: Medicare HMO | Admitting: Physician Assistant

## 2021-03-01 DIAGNOSIS — M86671 Other chronic osteomyelitis, right ankle and foot: Secondary | ICD-10-CM | POA: Diagnosis not present

## 2021-03-01 DIAGNOSIS — E11622 Type 2 diabetes mellitus with other skin ulcer: Secondary | ICD-10-CM | POA: Diagnosis not present

## 2021-03-01 DIAGNOSIS — E1169 Type 2 diabetes mellitus with other specified complication: Secondary | ICD-10-CM | POA: Diagnosis not present

## 2021-03-01 DIAGNOSIS — I10 Essential (primary) hypertension: Secondary | ICD-10-CM | POA: Diagnosis not present

## 2021-03-01 DIAGNOSIS — I480 Paroxysmal atrial fibrillation: Secondary | ICD-10-CM | POA: Diagnosis not present

## 2021-03-01 DIAGNOSIS — L97316 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 without evidence of necrosis: Secondary | ICD-10-CM | POA: Diagnosis not present

## 2021-03-01 DIAGNOSIS — M869 Osteomyelitis, unspecified: Secondary | ICD-10-CM | POA: Diagnosis not present

## 2021-03-01 LAB — GLUCOSE, CAPILLARY
Glucose-Capillary: 165 mg/dL — ABNORMAL HIGH (ref 70–99)
Glucose-Capillary: 110 mg/dL — ABNORMAL HIGH (ref 70–99)
Glucose-Capillary: 77 mg/dL (ref 70–99)

## 2021-03-01 NOTE — Progress Notes (Signed)
[name] [name], [name] [name] ([id number]) Visit Report for 03/01/2021 Arrival Information Details Patient Name: [name] [name], [name] [name] Date of Service: 03/01/2021 8:00 AM Medical Record Number: [id number] Patient Account Number: [id number] Date of Birth/Sex: 18-Nov-1940 (80 y.o. M) Treating RN: Hansel Feinstein Primary Care Leiah Giannotti: Lorenso Quarry Other Clinician: Izetta Dakin Referring Lima Chillemi: Lorenso Quarry Treating Oleg Oleson/Extender: Rowan Blase in Treatment: 8 Visit Information History Since Last Visit Added or deleted any medications: No Patient Arrived: Wheel Chair Any new allergies or adverse reactions: No Arrival Time: 08:05 Had a fall or experienced change in No Accompanied By: wife activities of daily living that may affect Transfer Assistance: None risk of falls: Patient Identification Verified: Yes Signs or symptoms of abuse/neglect since last visito No Secondary Verification Process Completed: Yes Hospitalized since last visit: No Patient Requires Transmission-Based No Implantable device outside of the clinic excluding No Precautions: cellular tissue based products placed in the center Patient Has Alerts: Yes since last visit: Patient Alerts: Patient on Blood Pain Present Now: No Thinner XARELTO Diabetic ABI left .87 TBI 0.26 ABI right .62 TBI 0.23 Electronic Signature(s) Signed: 03/01/2021 11:07:55 AM By: Aleda Grana RCP, RRT, CHT Entered By: Aleda Grana on 03/01/2021 08:33:08 [name] [name] ([id number]) -------------------------------------------------------------------------------- Encounter Discharge Information Details Patient Name: [name] [name] Date of Service: 03/01/2021 8:00 AM Medical Record Number: [id number] Patient Account Number: [id number] Date of Birth/Sex: Apr 13, 1941 (80 y.o. M) Treating RN: Hansel Feinstein Primary Care Caroleena Paolini: Lorenso Quarry Other Clinician: Izetta Dakin Referring Livia Tarr: Lorenso Quarry Treating Jemma Rasp/Extender:  Rowan Blase in Treatment: 8 Encounter Discharge Information Items Discharge Condition: Stable Ambulatory Status: Ambulatory Discharge Destination: Home Transportation: Private Auto Accompanied By: wife Schedule Follow-up Appointment: Yes Clinical Summary of Care: Notes Patient has an HBO treatment scheduled on 03/02/21 at 08:00 am. Electronic Signature(s) Signed: 03/01/2021 11:07:55 AM By: Aleda Grana RCP, RRT, CHT Entered By: Aleda Grana on 03/01/2021 11:07:27 [name] [name] ([id number]) -------------------------------------------------------------------------------- Vitals Details Patient Name: [name] [name] Date of Service: 03/01/2021 8:00 AM Medical Record Number: [id number] Patient Account Number: [id number] Date of Birth/Sex: 1940/08/12 (80 y.o. M) Treating RN: Hansel Feinstein Primary Care Atilla Zollner: Lorenso Quarry Other Clinician: Izetta Dakin Referring Lenvil Swaim: Lorenso Quarry Treating Neelah Mannings/Extender: Allen Derry Weeks in Treatment: 8 Vital Signs Time Taken: 08:05 Temperature (F): 98.3 Height (in): 72 Pulse (bpm): 90 Weight (lbs): 178 Respiratory Rate (breaths/min): 18 Body Mass Index (BMI): 24.1 Blood Pressure (mmHg): 118/66 Capillary Blood Glucose (mg/dl): 096 Reference Range: 80 - 120 mg / dl Electronic Signature(s) Signed: 03/01/2021 11:07:55 AM By: Aleda Grana RCP, RRT, CHT Entered By: Aleda Grana on 03/01/2021 08:33:44

## 2021-03-01 NOTE — Progress Notes (Addendum)
JAIVEN, GRAVELINE ([id number]) Visit Report for 03/01/2021 Chief Complaint Document Details Patient Name: [name] [name], [name] [name] Date of Service: 03/01/2021 11:30 AM Medical Record Number: [id number] Patient Account Number: [id number] Date of Birth/Sex: [birth year] (80 y.o. M) Treating RN: Huel Coventry Primary Care Provider: Lorenso Quarry Other Clinician: Referring Provider: Lorenso Quarry Treating Provider/Extender: Rowan Blase in Treatment: 8 Information Obtained from: Patient Chief Complaint Right ankle ulcer with osteomyelitis Electronic Signature(s) Signed: 03/01/2021 11:33:40 AM By: Lenda Kelp PA-C Entered By: Lenda Kelp on 03/01/2021 11:33:40 [name] [name] ([id number]) -------------------------------------------------------------------------------- HPI Details Patient Name: [name] [name] Date of Service: 03/01/2021 11:30 AM Medical Record Number: [id number] Patient Account Number: [id number] Date of Birth/Sex: [birth year] (80 y.o. M) Treating RN: Huel Coventry Primary Care Provider: Lorenso Quarry Other Clinician: Referring Provider: Lorenso Quarry Treating Provider/Extender: Rowan Blase in Treatment: 8 History of Present Illness HPI Description: 01/03/2021 upon evaluation today patient appears to be doing somewhat poorly in regard to wound on his right lateral ankle. This is quite significant based on what I see today. He tells me this is a site that back in 2006 he actually had a spider bite at this location that ended up requiring a fairly significant surgery to where he tells me that "the bone and there looks like a log lying in a creek". Nonetheless this sounds like it was a fairly significant wound at that time. Nonetheless its been okay for a number of years until more recently where unfortunately on around 12/06/2020 this began to breakdown and development what we are seeing currently. There is a significant amount of necrotic tissue in the central portion of the wound. More  importantly and concerning [name] his ABI is very low at 0.45 I am concerned that this is an arterial issue obviously he is diabetic and that could be playing a role as well but nonetheless I do believe we need to focus and see what we can do about getting him to vascular ASAP. The patient does have a history of diabetes mellitus type 2, hypertension, atrial fibrillation, and congestive heart failure. 01/10/2021 upon evaluation today patient appears to be doing well with regard to his wound. He has been tolerating the dressing changes without complication. The good news is the Levaquin does seem to be helping with overall clearing up the surface of the wound as well as the infection. We did do repeat fluorescence imaging today which showed that he had much less of the red fluorescence as compared to last week. This is great news. Still this was quite significant and still were not completely out of the water as far as bacterial load is concerned. 01/17/2021; patient with a large necrotic wound over the right lateral malleolus. Arterial studies from 9/22 showed an ABI of 0.46 biphasic waveforms at the PTA on the right. His TBI is 0.24 with a toe pressure of 32 mmHg. This is indicative of severe PAD and possibly limb threatening ischemia. The patient does not describe a lot of pain except at night. He is a minimal ambulator. He is using been using Dakin's half-strength 01/24/2021 upon evaluation today patient's ankle ulcer actually starting to loosen up as far as the eschar is concerned. With that being said the patient still is having quite a bit of issues with a significant wound here. There does not appear to be any pain that is improved with the antibiotic treatment which is good news he is done with the antibiotics at this point. With that being said initially I had  thought that we had sent him for an x-ray but he tells me that he did not get that form. Nonetheless I am going to send him for an x-ray of  the ankle currently. Fortunately I think that the infection is significantly better by 1 to make sure there is no evidence of osteomyelitis or something more significant that would indicate that we are going to have to address this a little bit more aggressively. Patient voiced understanding. 10/10; this patient continues to have a necrotic wound on the right lateral malleolus. This is about the same as last time I saw this. He has is vein and vascular consult on 10/13. X-ray of the area did did not show osteomyelitis but I agree with an MRI that is currently ordered on 10/18. The wound does not look any worse than the last time I saw it but its not improved significant amount of necrotic debris. 02/07/2021 upon evaluation today patient's wound actually showing signs of still having a significant amount of necrotic tissue in the base of the wound. Again would not perform any extensive sharp debridement I do believe there is necrotic tendon noted in the base of the wound as well and again his arterial flow is very low. He is going be having an arteriogram to try to see if they can open things up which I think is needed he definitely is at the critical limb ischemia situation at this point. His ABIs on the right were 0.62 and on the left 0.87 and the TBI on the right was 0.23 with on the left was 0.26. Either way this is significant and I think he is going to need to be addressed as soon as possible vascular is already supposed to get in touch with scheduling to try to get him in for the procedure.Patient has his MRI tomorrow. 02/15/2021 upon evaluation today patient presents for follow-up evaluation regarding his right ankle ulcer. We do have several results to review today. I did actually look at his MRI which showed evidence of abnormal edema and endosteal enhancement laterally in the distal fibula compatible with osteomyelitis. There was also tenosynovitis noted proximally to the ulceration.  Subsequently the patient did have subcutaneous edema on the dorsum of the foot along with medial ankle without definitive enhancement nonspecific for cellulitis versus infectious edema. Either way this seems to be related as well. I do believe that he likely does have an infection at this time that is prevalent both in the bone as well as in the soft tissues noted currently. With regard to the vascular evaluation and intervention he did have this on 02/10/2021 and this was a aortogram and selective right lower extremity angiogram. Subsequently of note the patient did have to have angioplasty on the segments down to the mid to distal posterior tibial artery to improve collateral flow distally. He also had occlusions that were still noted distally once that was completed. Subsequently the patient furthermore had balloon angioplasty of the ankle up to the origin of the anterior tibial artery to treat the long segment occlusion. Completion imaging showed excellent flow through the anterior tibial artery although circulation time remain slow there did not appear to be any greater than 20% residual stenosis which was good news. Lastly I did review the patient's echocardiogram as well this was performed January of this year and showed an ejection fraction which was stated to be 55%. Overall I think this is compatible with him going forward with hyperbaric oxygen therapy. Nonetheless we  will have to get a few other things taken care of as far as hyperbaric planning is concerned. 02/22/2021 upon evaluation today patient's wound is actually showing signs of improvement although he does have 1 area of deep tissue injury that had any concerns I discussed that with him today needs to be careful about keeping pressure off of this area. Otherwise he does have some additional granular growth which I think is good and the sooner we get him in the chamber the better he should be starting hyperbarics tomorrow assuming  that he ends up doing well with the EKG and chest x-rays which were performed today. 03/01/2021 upon evaluation today patient seems to be tolerating HBO therapy quite well without complication. Fortunately there does not appear to be any signs of active infection systemically at this time which is great news. Unfortunately I do think that he still has a significant wound he does have bone exposed as well as tendon exposed this is staying moist for the time being but nonetheless we are getting need to continue with aggressive therapy I think he probably would benefit from a wound VAC as soon as possible. We may also check into TheraSkin is a possibility as well. The biggest thing is we just have to see if we can get some things together and we have approvals I will probably talk to him more about the possibility of TheraSkin next week but I do believe either way the wound VAC is going to be necessary. Electronic Signature(s) ASAEL, FUTRELL ([id number]) Signed: 03/01/2021 2:42:31 PM By: Lenda Kelp PA-C Entered By: Lenda Kelp on 03/01/2021 14:42:30 [name] [name] ([id number]) -------------------------------------------------------------------------------- Physical Exam Details Patient Name: [name] [name] Date of Service: 03/01/2021 11:30 AM Medical Record Number: [id number] Patient Account Number: [id number] Date of Birth/Sex: 1940/05/16 (80 y.o. M) Treating RN: Huel Coventry Primary Care Provider: Lorenso Quarry Other Clinician: Referring Provider: Lorenso Quarry Treating Provider/Extender: Allen Derry Weeks in Treatment: 8 Constitutional Well-nourished and well-hydrated in no acute distress. Respiratory normal breathing without difficulty. Psychiatric this patient is able to make decisions and demonstrates good insight into disease process. Alert and Oriented x 3. pleasant and cooperative. Notes Upon inspection patient's wound bed showed signs of good granulation epithelization at this  point. Fortunately there does not appear to be any evidence of active infection systemically which is great news and overall very pleased. He does have known osteomyelitis he is undergoing HBO therapy for that at this point. Electronic Signature(s) Signed: 03/01/2021 2:42:59 PM By: Lenda Kelp PA-C Entered By: Lenda Kelp on 03/01/2021 14:42:58 [name] [name] ([id number]) -------------------------------------------------------------------------------- Physician Orders Details Patient Name: [name] [name] Date of Service: 03/01/2021 11:30 AM Medical Record Number: [id number] Patient Account Number: [id number] Date of Birth/Sex: 1941-01-11 (80 y.o. 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 o Other: - See your primary care physician about your low blood pressure and pain control Bathing/ Shower/ Hygiene o May shower with wound dressing protected with water repellent cover or cast protector. o No tub  bath. Edema Control - Lymphedema / Segmental Compressive Device / Other o Elevate leg(s) parallel to the floor when sitting. o DO YOUR BEST to sleep in the bed at night. DO NOT sleep in your recliner. Long hours of sitting in a recliner leads to swelling of the legs and/or potential wounds on your backside. Off-Loading o Other: - keep  pressure off of the wound/right ankle to promote healing Additional Orders / Instructions o Decrease/Stop Smoking - tobacco products not recommended o Follow Nutritious Diet and Increase Protein Intake - increase protein intake; eat breakfast o Other: - Check your blood sugar daily Hyperbaric Oxygen Therapy Wound #1 Right,Lateral Malleolus o Evaluate for HBO Therapy o Indication and location: - Osteomyelitis with Wag Grade 3 ulcer right ankle o If appropriate for treatment, begin HBOT per protocol: o 2.0 ATA for 90 Minutes without Air Breaks o One treatment per day (delivered Monday through Friday unless otherwise specified in Special Instructions below): o Total # of Treatments: - 40 o Finger stick Blood Glucose Pre- and Post- HBOT Treatment. o Follow Hyperbaric Oxygen Glycemia Protocol Medications-Please add to medication list. o Take one 500mg  Tylenol (Acetaminophen) and one 200mg  Motrin (Ibuprofen) every 6 hours for pain. Do not take ibuprofen if you are on blood thinners or have stomach ulcers. - for pain as needed o P.O. Antibiotics - Finish antibiotics as prescribed Wound Treatment Wound #1 - Malleolus Wound Laterality: Right, Lateral Primary Dressing: Gauze 1 x Per Day/30 Days Discharge Instructions: Dakins As directed: dry, moistened with saline or moistened with Dakins Solution Secondary Dressing: ABD Pad 5x9 (in/in) (Generic) 1 x Per Day/30 Days Discharge Instructions: Cover with ABD pad Shrake, Mallory ([id number]) Secondary Dressing: Conforming Guaze Roll-Large (Generic) 1 x Per Day/30 Days Discharge Instructions: Apply Conforming Stretch Guaze Bandage as directed Secured With: 62M Medipore H Soft Cloth Surgical Tape, 2x2 (in/yd) (Generic) 1 x Per Day/30 Days GLYCEMIA INTERVENTIONS PROTOCOL PRE-HBO GLYCEMIA INTERVENTIONS ACTION INTERVENTION Obtain pre-HBO capillary blood glucose (ensure 1 physician order is in chart). A. Notify HBO physician and  await physician orders. 2 If result is 70 mg/dl or below: B. If the result meets the hospital definition of a critical result, follow hospital policy. A. Give patient an 8 ounce Glucerna Shake, an 8 ounce Ensure, or 8 ounces of a Glucerna/Ensure equivalent dietary supplement*. B. Wait 30 minutes. If result is 71 mg/dl to 390 mg/dl: C. Retest patientos capillary blood glucose (CBG). D. If result greater than or equal to 110 mg/dl, proceed with HBO. If result less than 110 mg/dl, notify HBO physician and consider holding HBO. If result is 131 mg/dl to 300 mg/dl: A. Proceed with HBO. A. Notify HBO physician and await physician orders. B. It is recommended to hold HBO and do If result is 250 mg/dl or greater: blood/urine ketone testing. C. If the result meets the hospital definition of a critical result, follow hospital policy. POST-HBO GLYCEMIA INTERVENTIONS ACTION INTERVENTION Obtain post HBO capillary blood glucose (ensure 1 physician order is in chart). A. Notify HBO physician and await physician orders. 2 If result is 70 mg/dl or below: B. If the result meets the hospital definition of a critical result, follow hospital policy. A. Give patient an 8 ounce Glucerna Shake, an 8 ounce Ensure, or 8 ounces of a Glucerna/Ensure equivalent dietary supplement*. B. Wait 15 minutes for symptoms of hypoglycemia (i.e. nervousness, anxiety, If result is 71 mg/dl to 923 mg/dl: sweating, chills, clamminess, irritability, confusion, tachycardia or dizziness). C. If patient asymptomatic, discharge patient.  If patient symptomatic, repeat capillary blood glucose (CBG) and notify HBO physician. If result is 101 mg/dl to 161 mg/dl: A. Discharge patient. A. Notify HBO physician and await physician orders. B. It is recommended to do blood/urine If result is 250 mg/dl or greater: ketone testing. C. If the result meets the hospital definition of a critical result, follow hospital  policy. *Juice or candies are NOT equivalent products. If patient refuses the Glucerna or Ensure, please consult the hospital dietitian for an appropriate substitute. Electronic Signature(s) Signed: 03/01/2021 12:02:08 PM By: Elliot Gurney, BSN, RN, CWS, Kim RN, BSN Signed: 03/01/2021 6:00:47 PM By: Lenda Kelp PA-C Entered By: Elliot Gurney BSN, RN, CWS, Kim on 03/01/2021 12:02:08 [name] [name] ([id number]) -------------------------------------------------------------------------------- Problem List Details Patient Name: [name] [name] Date of Service: 03/01/2021 11:30 AM Medical Record Number: [id number] Patient Account Number: [id number] Date of Birth/Sex: 03-07-1941 (80 y.o. 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 without 01/03/2021 No Yes evidence of necrosis I10 Essential (primary) hypertension 01/03/2021 No Yes I48.0 Paroxysmal atrial fibrillation 01/03/2021 No Yes I50.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 M) Treating RN: Huel Coventry Primary  Care Provider: Lorenso Quarry Other Clinician: Referring Provider: Lorenso Quarry Treating Provider/Extender: Rowan Blase in Treatment: 8 Subjective Chief Complaint Information obtained from Patient Right ankle ulcer with osteomyelitis History of Present Illness (HPI) 01/03/2021 upon evaluation today patient appears to be doing somewhat poorly in regard to wound on his right lateral ankle. This is quite significant based on what I see today. He tells me this is a site that back in 2006 he actually had a spider bite at this location that ended up requiring a fairly significant surgery to where he tells me that "the bone and there looks like a log lying in a creek". Nonetheless this sounds like it was a fairly significant wound at that time. Nonetheless its been okay for a number of years until more recently where unfortunately on around 12/06/2020 this began to breakdown and development what we are seeing currently. There is a significant amount of necrotic tissue in the central portion of the wound. More importantly and concerning [name] his ABI is very low at 0.45 I am concerned that this is an arterial issue obviously he is diabetic and that could be playing a role as well but nonetheless I do believe we need to focus and see what we can do about getting him to vascular ASAP. The patient does have a history of diabetes mellitus type 2, hypertension, atrial fibrillation, and congestive heart failure. 01/10/2021 upon evaluation today patient appears to be doing well with regard to his wound. He has been tolerating the dressing changes without complication. The good news is the Levaquin does seem to be helping with overall clearing up the surface of the wound as well as the  infection. We did do repeat fluorescence imaging today which showed that he had much less of the red fluorescence as compared to last week. This is great news. Still this was quite significant and still were not completely out  of the water as far as bacterial load is concerned. 01/17/2021; patient with a large necrotic wound over the right lateral malleolus. Arterial studies from 9/22 showed an ABI of 0.46 biphasic waveforms at the PTA on the right. His TBI is 0.24 with a toe pressure of 32 mmHg. This is indicative of severe PAD and possibly limb threatening ischemia. The patient does not describe a lot of pain except at night. He is a minimal ambulator. He is using been using Dakin's half-strength 01/24/2021 upon evaluation today patient's ankle ulcer actually starting to loosen up as far as the eschar is concerned. With that being said the patient still is having quite a bit of issues with a significant wound here. There does not appear to be any pain that is improved with the antibiotic treatment which is good news he is done with the antibiotics at this point. With that being said initially I had thought that we had sent him for an x-ray but he tells me that he did not get that form. Nonetheless I am going to send him for an x-ray of the ankle currently. Fortunately I think that the infection is significantly better by 1 to make sure there is no evidence of osteomyelitis or something more significant that would indicate that we are going to have to address this a little bit more aggressively. Patient voiced understanding. 10/10; this patient continues to have a necrotic wound on the right lateral malleolus. This is about the same as last time I saw this. He has is vein and vascular consult on 10/13. X-ray of the area did did not show osteomyelitis but I agree with an MRI that is currently ordered on 10/18. The wound does not look any worse than the last time I saw it but its not improved significant amount of necrotic debris. 02/07/2021 upon evaluation today patient's wound actually showing signs of still having a significant amount of necrotic tissue in the base of the wound. Again would not perform any extensive sharp  debridement I do believe there is necrotic tendon noted in the base of the wound as well and again his arterial flow is very low. He is going be having an arteriogram to try to see if they can open things up which I think is needed he definitely is at the critical limb ischemia situation at this point. His ABIs on the right were 0.62 and on the left 0.87 and the TBI on the right was 0.23 with on the left was 0.26. Either way this is significant and I think he is going to need to be addressed as soon as possible vascular is already supposed to get in touch with scheduling to try to get him in for the procedure.Patient has his MRI tomorrow. 02/15/2021 upon evaluation today patient presents for follow-up evaluation regarding his right ankle ulcer. We do have several results to review today. I did actually look at his MRI which showed evidence of abnormal edema and endosteal enhancement laterally in the distal fibula compatible with osteomyelitis. There was also tenosynovitis noted proximally to the ulceration. Subsequently the patient did have subcutaneous edema on the dorsum of the foot along with medial ankle without definitive enhancement nonspecific for cellulitis versus infectious edema. Either way this seems to  be related as well. I do believe that he likely does have an infection at this time that is prevalent both in the bone as well as in the soft tissues noted currently. With regard to the vascular evaluation and intervention he did have this on 02/10/2021 and this was a aortogram and selective right lower extremity angiogram. Subsequently of note the patient did have to have angioplasty on the segments down to the mid to distal posterior tibial artery to improve collateral flow distally. He also had occlusions that were still noted distally once that was completed. Subsequently the patient furthermore had balloon angioplasty of the ankle up to the origin of the anterior tibial artery to treat  the long segment occlusion. Completion imaging showed excellent flow through the anterior tibial artery although circulation time remain slow there did not appear to be any greater than 20% residual stenosis which was good news. Lastly I did review the patient's echocardiogram as well this was performed January of this year and showed an ejection fraction which was stated to be 55%. Overall I think this is compatible with him going forward with hyperbaric oxygen therapy. Nonetheless we will have to get a few other things taken care of as far as hyperbaric planning is concerned. 02/22/2021 upon evaluation today patient's wound is actually showing signs of improvement although he does have 1 area of deep tissue injury that had any concerns I discussed that with him today needs to be careful about keeping pressure off of this area. Otherwise he does have some additional granular growth which I think is good and the sooner we get him in the chamber the better he should be starting hyperbarics tomorrow assuming that he ends up doing well with the EKG and chest x-rays which were performed today. 03/01/2021 upon evaluation today patient seems to be tolerating HBO therapy quite well without complication. Fortunately there does not appear to be any signs of active infection systemically at this time which is great news. Unfortunately I do think that he still has a significant wound he does have bone exposed as well as tendon exposed this is staying moist for the time being but nonetheless we are getting need to continue with aggressive therapy I think he probably would benefit from a wound VAC as soon as possible. We may also check into TheraSkin is a possibility as well. The biggest thing is we just have to see if we can get some things together and we have approvals I will probably talk to him more about the [facility], Neel ([id number]) possibility of TheraSkin next week but I do believe either way the wound VAC  is going to be necessary. Objective Constitutional Well-nourished and well-hydrated in no acute distress. Respiratory normal breathing without difficulty. Psychiatric this patient is able to make decisions and demonstrates good insight into disease process. Alert and Oriented x 3. pleasant and cooperative. General Notes: Upon inspection patient's wound bed showed signs of good granulation epithelization at this point. Fortunately there does not appear to be any evidence of active infection systemically which is great news and overall very pleased. He does have known osteomyelitis he is undergoing HBO therapy for that at this point. Integumentary (Hair, Skin) Wound #1 status is Open. Original cause of wound was Gradually Appeared. The date acquired was: 12/06/2020. The wound has been in treatment 8 weeks. The wound is located on the Right,Lateral Malleolus. The wound measures 5.5cm length x 3.5cm width x 1.8cm depth; 15.119cm^2 area and 27.214cm^3 volume. There  is bone, muscle, tendon, and Fat Layer (Subcutaneous Tissue) exposed. There is a medium amount of serosanguineous drainage noted. Foul odor after cleansing was noted. The wound margin is distinct with the outline attached to the wound base. There is small (1-33%) red, hyper - granulation within the wound bed. There is a large (67-100%) amount of necrotic tissue within the wound bed including Eschar and Adherent Slough. 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 Other: - See your primary care physician about your low blood pressure and pain  control Bathing/ Shower/ Hygiene: May shower with wound dressing protected with water repellent cover or cast protector. No tub bath. Edema Control - Lymphedema / Segmental Compressive Device / Other: Elevate leg(s) parallel to the floor when sitting. DO YOUR BEST to sleep in the bed at night. DO NOT sleep in your recliner. Long hours of sitting in a recliner leads to swelling of the legs and/or potential wounds on your backside. Off-Loading: Other: - keep pressure off of the wound/right ankle to promote healing Additional Orders / Instructions: Decrease/Stop Smoking - tobacco products not recommended Follow Nutritious Diet and Increase Protein Intake - increase protein intake; eat breakfast Other: - Check your blood sugar daily Hyperbaric Oxygen Therapy: Wound #1 Right,Lateral Malleolus: Evaluate for HBO Therapy Morash, Lemario ([id number]) Indication and location: - Osteomyelitis with Wag Grade 3 ulcer right ankle If appropriate for treatment, begin HBOT per protocol: 2.0 ATA for 90 Minutes without Air Breaks One treatment per day (delivered Monday through Friday unless otherwise specified in Special Instructions below): Total # of Treatments: - 40 Finger stick Blood Glucose Pre- and Post- HBOT Treatment. Follow Hyperbaric Oxygen Glycemia Protocol Medications-Please add to medication list.: Take one 500mg  Tylenol (Acetaminophen) and one 200mg  Motrin (Ibuprofen) every 6 hours for pain. Do not take ibuprofen if you are on blood thinners or have stomach ulcers. - for pain as needed P.O. Antibiotics - Finish antibiotics as prescribed WOUND #1: - Malleolus Wound Laterality: Right, Lateral Primary Dressing: Gauze 1 x Per Day/30 Days Discharge Instructions: Dakins As directed: dry, moistened with saline or moistened with Dakins Solution Secondary Dressing: ABD Pad 5x9 (in/in) (Generic) 1 x Per Day/30 Days Discharge Instructions: Cover with ABD pad Secondary Dressing: Conforming Guaze  Roll-Large (Generic) 1 x Per Day/30 Days Discharge Instructions: Apply Conforming Stretch Guaze Bandage as directed Secured With: 60M Medipore H Soft Cloth Surgical Tape, 2x2 (in/yd) (Generic) 1 x Per Day/30 Days 1. I am going to continue with the Dakin's moistened gauze dressing I think that is probably still the best way to go at this point based on what we are seeing. 2. I am also can recommend that we have the patient continue to monitor for any signs of worsening or infection if anything changes he should let me know. Right now he has been having pain but the wound overall does seem to be making good improvement. 3. We will continue with HBO therapy. We will see patient back for reevaluation in 1 week here in the clinic. If anything worsens or changes patient will contact our office for additional recommendations. Electronic Signature(s) Signed: 03/01/2021 2:43:26 PM By: PA-C Entered By: 13/11/2020 on 03/01/2021 14:43:25 Lenda Kelp, 13/11/2020 (Cephus Shelling) --------------------------------------------------------------------------------  SuperBill Details Patient Name: LOVE, MILBOURNE Date of Service: 03/01/2021 Medical Record Number: [id number] Patient Account Number: [id number] Date of Birth/Sex: 04/27/1940 (80 y.o. 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 I10 Essential (primary) hypertension I48.0 Paroxysmal atrial fibrillation I50.42 Chronic combined systolic (congestive) and diastolic (congestive) heart failure Facility Procedures CPT4 Code: [id number] Description: 99213 - WOUND  CARE VISIT-LEV 3 EST PT Modifier: Quantity: 1 Physician Procedures CPT4 Code Description: [id number] 95638 - WC PHYS LEVEL 4 - EST PT Modifier: Quantity: 1 CPT4 Code Description: ICD-10 Diagnosis Description M86.671 Other chronic osteomyelitis, right ankle and foot E11.622 Type 2 diabetes mellitus with other skin ulcer L97.316 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

## 2021-03-01 NOTE — Progress Notes (Signed)
KODIAK, ROLLYSON ([id number]) Visit Report for 03/01/2021 Arrival Information Details Patient Name: [name] [name], [name] [name] Date of Service: 03/01/2021 11:30 AM Medical Record Number: [id number] Patient Account Number: [id number] Date of Birth/Sex: September 29, 1940 (80 y.o. M) Treating RN: Cornell Barman Primary Care Dorine Duffey: Toni Arthurs Other Clinician: Referring Jessilyn Catino: Toni Arthurs Treating Arena Lindahl/Extender: Skipper Cliche in Treatment: 8 Visit Information History Since Last Visit Added or deleted any medications: No Patient Arrived: Wheel Chair Has Dressing in Place as Prescribed: Yes Arrival Time: 11:25 Pain Present Now: No Accompanied By: wife Transfer Assistance: Manual Patient Identification Verified: Yes Secondary Verification Process Completed: Yes Patient Requires Transmission-Based No Precautions: Patient Has Alerts: Yes Patient Alerts: Patient on Blood Thinner XARELTO Diabetic ABI left .87 TBI 0.26 ABI right .62 TBI 0.23 Electronic Signature(s) Signed: 03/01/2021 5:08:34 PM By: Gretta Cool, BSN, RN, CWS, Kim RN, BSN Entered By: Gretta Cool, BSN, RN, CWS, Kim on 03/01/2021 11:25:41 [name] [name] ([id number]) -------------------------------------------------------------------------------- Clinic Level of Care Assessment Details Patient Name: [name] [name] Date of Service: 03/01/2021 11:30 AM Medical Record Number: [id number] Patient Account Number: [id number] Date of Birth/Sex: 01/04/1941 (80 y.o. M) Treating RN: Cornell Barman Primary Care Tanyiah Laurich: Toni Arthurs Other Clinician: Referring Asya Derryberry: Toni Arthurs Treating Jeris Roser/Extender: Skipper Cliche in Treatment: 8 Clinic Level of Care Assessment Items TOOL 4 Quantity Score _0  - Use when only an EandM is performed on FOLLOW-UP visit 0 ASSESSMENTS - Nursing Assessment / Reassessment X - Reassessment of Co-morbidities (includes updates in patient status) 1 10 X- 1 5 Reassessment of Adherence to Treatment Plan ASSESSMENTS - Wound  and Skin Assessment / Reassessment X - Simple Wound Assessment / Reassessment - one wound 1 5 _1  - 0 Complex Wound Assessment / Reassessment - multiple wounds _2  - 0 Dermatologic / Skin Assessment (not related to wound area) ASSESSMENTS - Focused Assessment _3  - Circumferential Edema Measurements - multi extremities 0 _4  - 0 Nutritional Assessment / Counseling / Intervention _5  - 0 Lower Extremity Assessment (monofilament, tuning fork, pulses) _6  - 0 Peripheral Arterial Disease Assessment (using hand held doppler) ASSESSMENTS - Ostomy and/or Continence Assessment and Care _7  - Incontinence Assessment and Management 0 _8  - 0 Ostomy Care Assessment and Management (repouching, etc.) PROCESS - Coordination of Care X - Simple Patient / Family Education for ongoing care 1 15 _9  - 0 Complex (extensive) Patient / Family Education for ongoing care X- 1 10 Staff obtains Programmer, systems, Records, Test Results / Process Orders _10  - 0 Staff telephones HHA, Nursing Homes / Clarify orders / etc _11  - 0 Routine Transfer to another Facility (non-emergent condition) _12  - 0 Routine Hospital Admission (non-emergent condition) _13  - 0 New Admissions / Biomedical engineer / Ordering NPWT, Apligraf, etc. _14  - 0 Emergency Hospital Admission (emergent condition) X- 1 10 Simple Discharge Coordination _15  - 0 Complex (extensive) Discharge Coordination PROCESS - Special Needs _16  - Pediatric / Minor Patient Management 0 _17  - 0 Isolation Patient Management _18  - 0 Hearing / Language / Visual special needs _19  - 0 Assessment of Community assistance (transportation, D/C planning, etc.) _20  - 0 Additional assistance / Altered mentation _21  - 0 Support Surface(s) Assessment (bed, cushion, seat, etc.) INTERVENTIONS - Wound Cleansing / Measurement Hill, Miran ([id number]) X- 1 5 Simple Wound Cleansing - one wound _22  - 0 Complex Wound Cleansing - multiple wounds X- 1 5 Wound Imaging (photographs - any  number of wounds) _23  - 0 Wound Tracing (instead of photographs) _24  - 0 Simple Wound Measurement - one wound _25  - 0 Complex Wound Measurement -  multiple wounds INTERVENTIONS - Wound Dressings _0  - Small Wound Dressing one or multiple wounds 0 _1  - 0 Medium Wound Dressing one or multiple wounds X- 1 20 Large Wound Dressing one or multiple wounds <GEXBMWUXLKGMWNUU>_7<\/OZDGUYQIHKVQQVZD>_6  - 0 Application of Medications - topical <LOVFIEPPIRJJOACZ>_6<\/SAYTKZSWFUXNATFT>_7  - 0 Application of Medications - injection INTERVENTIONS - Miscellaneous _4  - External ear exam 0 _5  - 0 Specimen Collection (cultures, biopsies, blood, body fluids, etc.) _6  - 0 Specimen(s) / Culture(s) sent or taken to Lab for analysis _7  - 0 Patient Transfer (multiple staff / Harrel Lemon Lift / Similar devices) _8  - 0 Simple Staple / Suture removal (25 or less) _9  - 0 Complex Staple / Suture removal (26 or more) _10  - 0 Hypo / Hyperglycemic Management (close monitor of Blood Glucose) _11  - 0 Ankle / Brachial Index (ABI) - do not check if billed separately X- 1 5 Vital Signs Has the patient been seen at the hospital within the last three years: Yes Total Score: 90 Level Of Care: New/Established - Level 3 Electronic Signature(s) Signed: 03/01/2021 5:08:34 PM By: Gretta Cool, BSN, RN, CWS, Kim RN, BSN Entered By: Gretta Cool, BSN, RN, CWS, Kim on 03/01/2021 12:02:36 [name] [name] ([id number]) -------------------------------------------------------------------------------- Encounter Discharge Information Details Patient Name: [name] [name] Date of Service: 03/01/2021 11:30 AM Medical Record Number: [id number] Patient Account Number: [id number] Date of Birth/Sex: 1940/11/08 (80 y.o. M) Treating RN: Cornell Barman Primary Care Taggart Prasad: Toni Arthurs Other Clinician: Referring Cameren Earnest: Toni Arthurs Treating Yanelle Sousa/Extender: Skipper Cliche in Treatment: 8 Encounter Discharge Information Items Discharge Condition: Stable Ambulatory Status: Wheelchair Discharge Destination: Home Transportation:  Private Auto Accompanied By: wife Schedule Follow-up Appointment: Yes Clinical Summary of Care: Electronic Signature(s) Signed: 03/01/2021 12:03:41 PM By: Gretta Cool, BSN, RN, CWS, Kim RN, BSN Entered By: Gretta Cool, BSN, RN, CWS, Kim on 03/01/2021 12:03:41 [name] [name] ([id number]) -------------------------------------------------------------------------------- Lower Extremity Assessment Details Patient Name: [name] [name] Date of Service: 03/01/2021 11:30 AM Medical Record Number: [id number] Patient Account Number: [id number] Date of Birth/Sex: 1940/05/20 (80 y.o. M) Treating RN: Cornell Barman Primary Care Darl Kuss: Toni Arthurs Other Clinician: Referring Arion Shankles: Toni Arthurs Treating Nanda Bittick/Extender: Skipper Cliche in Treatment: 8 Vascular Assessment Pulses: Dorsalis Pedis Palpable: [Right:Yes] Electronic Signature(s) Signed: 03/01/2021 5:08:34 PM By: Gretta Cool, BSN, RN, CWS, Kim RN, BSN Entered By: Gretta Cool, BSN, RN, CWS, Kim on 03/01/2021 11:43:01 [name] [name] ([id number]) -------------------------------------------------------------------------------- Multi Wound Chart Details Patient Name: [name] [name] Date of Service: 03/01/2021 11:30 AM Medical Record Number: [id number] Patient Account Number: [id number] Date of Birth/Sex: September 26, 1940 (80 y.o. M) Treating RN: Cornell Barman Primary Care Shylin Keizer: Toni Arthurs Other Clinician: Referring Melvyn Hommes: Toni Arthurs Treating Zakira Ressel/Extender: Skipper Cliche in Treatment: 8 Photos: [N/A:N/A] Wound Location: Right, Lateral Malleolus N/A N/A Wounding Event: Gradually Appeared N/A N/A Primary Etiology: Diabetic Wound/Ulcer of the Lower N/A N/A Extremity Comorbid History: Cataracts, Arrhythmia, Congestive N/A N/A Heart Failure, Hypertension, Type II Diabetes Date Acquired: 12/06/2020 N/A N/A Weeks of Treatment: 8 N/A N/A Wound Status: Open N/A N/A Measurements L x W x D (cm) 5.5x3.5x1.8 N/A N/A Area (cm) : 15.119 N/A N/A Volume  (cm) : 27.214 N/A N/A % Reduction in Area: -87.60% N/A N/A % Reduction in Volume: -322.10% N/A N/A Classification: Grade 3 N/A N/A Exudate Amount: Medium N/A N/A Exudate Type: Serosanguineous N/A N/A Exudate Color: red, brown N/A N/A Foul Odor After Cleansing: Yes N/A N/A Odor Anticipated Due to Product No N/A N/A Use: Wound Margin: Distinct, outline attached N/A N/A Granulation Amount: Small (1-33%) N/A N/A Granulation Quality: Red, Hyper-granulation N/A N/A Necrotic Amount:  Large (67-100%) N/A N/A Necrotic Tissue: Eschar, Adherent Slough N/A N/A Exposed Structures: Fat Layer (Subcutaneous Tissue): N/A N/A Yes Tendon: Yes Muscle: Yes Bone: Yes Fascia: No Joint: No Epithelialization: None N/A N/A Treatment Notes Electronic Signature(s) Signed: 03/01/2021 5:08:34 PM By: Gretta Cool, BSN, RN, CWS, Kim RN, BSN Entered By: Gretta Cool, BSN, RN, CWS, Kim on 03/01/2021 11:43:24 [name] [name] ([id number]) -------------------------------------------------------------------------------- Multi-Disciplinary Care Plan Details Patient Name: [name] [name] Date of Service: 03/01/2021 11:30 AM Medical Record Number: [id number] Patient Account Number: [id number] Date of Birth/Sex: 17-Dec-1940 (80 y.o. M) Treating RN: Cornell Barman Primary Care Hennie Gosa: Toni Arthurs Other Clinician: Referring Taron Conrey: Toni Arthurs Treating Greenlee Ancheta/Extender: Skipper Cliche in Treatment: 8 Active Inactive Wound/Skin Impairment Nursing Diagnoses: Impaired tissue integrity Knowledge deficit related to smoking impact on wound healing Knowledge deficit related to ulceration/compromised skin integrity Goals: Patient/caregiver will verbalize understanding of skin care regimen Date Initiated: 01/03/2021 Date Inactivated: 01/17/2021 Target Resolution Date: 01/13/2021 Goal Status: Met Ulcer/skin breakdown will have a volume reduction of 30% by week 4 Date Initiated: 01/03/2021 Date Inactivated: 01/31/2021 Target  Resolution Date: 02/02/2021 Unmet Reason: AVVS consult/MRI Goal Status: Unmet ordered Ulcer/skin breakdown will have a volume reduction of 50% by week 8 Date Initiated: 01/03/2021 Target Resolution Date: 03/05/2021 Goal Status: Active Ulcer/skin breakdown will have a volume reduction of 80% by week 12 Date Initiated: 01/03/2021 Target Resolution Date: 04/04/2021 Goal Status: Active Ulcer/skin breakdown will heal within 14 weeks Date Initiated: 01/03/2021 Target Resolution Date: 04/18/2021 Goal Status: Active Interventions: Assess patient/caregiver ability to obtain necessary supplies Assess patient/caregiver ability to perform ulcer/skin care regimen upon admission and as needed Assess ulceration(s) every visit Notes: Electronic Signature(s) Signed: 03/01/2021 5:08:34 PM By: Gretta Cool, BSN, RN, CWS, Kim RN, BSN Entered By: Gretta Cool, BSN, RN, CWS, Kim on 03/01/2021 11:43:11 [name] [name] ([id number]) -------------------------------------------------------------------------------- Pain Assessment Details Patient Name: [name] [name] Date of Service: 03/01/2021 11:30 AM Medical Record Number: [id number] Patient Account Number: [id number] Date of Birth/Sex: 20-Sep-1940 (80 y.o. M) Treating RN: Cornell Barman Primary Care Butch Otterson: Toni Arthurs Other Clinician: Referring Billey Wojciak: Toni Arthurs Treating Florabelle Cardin/Extender: Skipper Cliche in Treatment: 8 Active Problems Location of Pain Severity and Description of Pain Patient Has Paino Yes Site Locations Pain Location: Pain in Ulcers Pain Management and Medication Current Pain Management: Notes states pain is at night Electronic Signature(s) Signed: 03/01/2021 5:08:34 PM By: Gretta Cool, BSN, RN, CWS, Kim RN, BSN Entered By: Gretta Cool, BSN, RN, CWS, Kim on 03/01/2021 11:26:10 [name] [name] ([id number]) -------------------------------------------------------------------------------- Wound Assessment Details Patient Name: [name] [name] Date of  Service: 03/01/2021 11:30 AM Medical Record Number: [id number] Patient Account Number: [id number] Date of Birth/Sex: February 22, 1941 (80 y.o. M) Treating RN: Cornell Barman Primary Care Garett Tetzloff: Toni Arthurs Other Clinician: Referring Fareedah Mahler: Toni Arthurs Treating Suleyma Wafer/Extender: Jeri Cos Weeks in Treatment: 8 Wound Status Wound Number: 1 Primary Diabetic Wound/Ulcer of the Lower Extremity Etiology: Wound Location: Right, Lateral Malleolus Wound Status: Open Wounding Event: Gradually Appeared Comorbid Cataracts, Arrhythmia, Congestive Heart Failure, Date Acquired: 12/06/2020 History: Hypertension, Type II Diabetes Weeks Of Treatment: 8 Clustered Wound: No Photos Wound Measurements Length: (cm) 5.5 % Re Width: (cm) 3.5 % Re Depth: (cm) 1.8 Epit Area: (cm) 15.119 Volume: (cm) 27.214 duction in Area: -87.6% duction in Volume: -322.1% helialization: None Wound Description Classification: Grade 3 Fou Wound Margin: Distinct, outline attached Due Exudate Amount: Medium Slo Exudate Type: Serosanguineous Exudate Color: red, brown l Odor After Cleansing: Yes to Product Use: No ugh/Fibrino Yes Wound Bed Granulation Amount: Small (1-33%) Exposed Structure Granulation Quality: Red, Hyper-granulation Fascia  Exposed: No Necrotic Amount: Large (67-100%) Fat Layer (Subcutaneous Tissue) Exposed: Yes Necrotic Quality: Eschar, Adherent Slough Tendon Exposed: Yes Muscle Exposed: Yes Necrosis of Muscle: No Joint Exposed: No Bone Exposed: Yes Treatment Notes Wound #1 (Malleolus) Wound Laterality: Right, Lateral Cleanser Peri-Wound Care [facility], Leane Para ([id number]) Topical Primary Dressing Gauze Discharge Instruction: Dakins As directed: dry, moistened with saline or moistened with Dakins Solution Secondary Dressing ABD Pad 5x9 (in/in) Discharge Instruction: Cover with ABD pad [place] Discharge Instruction: [place] as  directed Secured With 76M [place] Surgical Tape, 2x2 (in/yd) Compression Wrap Compression Stockings Environmental education officer) Signed: 03/01/2021 5:08:34 PM By: Gretta Cool, BSN, RN, CWS, Kim RN, BSN Entered By: Gretta Cool, BSN, RN, CWS, Kim on 03/01/2021 11:31:07

## 2021-03-02 ENCOUNTER — Other Ambulatory Visit: Payer: Self-pay

## 2021-03-02 ENCOUNTER — Encounter (HOSPITAL_BASED_OUTPATIENT_CLINIC_OR_DEPARTMENT_OTHER): Payer: Medicare HMO | Admitting: Internal Medicine

## 2021-03-02 DIAGNOSIS — M86671 Other chronic osteomyelitis, right ankle and foot: Secondary | ICD-10-CM

## 2021-03-02 DIAGNOSIS — E1169 Type 2 diabetes mellitus with other specified complication: Secondary | ICD-10-CM | POA: Diagnosis not present

## 2021-03-02 DIAGNOSIS — L97316 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 without evidence of necrosis: Secondary | ICD-10-CM

## 2021-03-02 DIAGNOSIS — E11622 Type 2 diabetes mellitus with other skin ulcer: Secondary | ICD-10-CM

## 2021-03-02 DIAGNOSIS — M869 Osteomyelitis, unspecified: Secondary | ICD-10-CM | POA: Diagnosis not present

## 2021-03-02 LAB — GLUCOSE, CAPILLARY
Glucose-Capillary: 220 mg/dL — ABNORMAL HIGH (ref 70–99)
Glucose-Capillary: 143 mg/dL — ABNORMAL HIGH (ref 70–99)

## 2021-03-02 NOTE — Progress Notes (Signed)
KYZER, BLOWE ([id number]) Visit Report for 03/01/2021 HBO Details Patient Name: [name] [name], [name] [name] Date of Service: 03/01/2021 8:00 AM Medical Record Number: [id number] Patient Account Number: [id number] Date of Birth/Sex: 1940/12/23 (80 y.o. 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 HBO Treatment Details Treatment Number: 2 Patient Type: Outpatient Chamber Type: Monoplace Chamber Serial #: [id number] Treatment Protocol: 2.0 ATA with 90 minutes oxygen, and no air breaks Treatment Details Compression Rate Down: 1.0 psi / minute De-Compression Rate Up: 1.5 psi / minute Compress Tx Pressure Air breaks and breathing periods Decompress Decompress Begins Reached (leave unused spaces blank) Begins Ends Chamber Pressure (ATA) 1 2 - - - - - - 2 1 Clock Time (24 hr) 08:30 08:46 - - - - - - 10:16 10:26 Treatment Length: 116 (minutes) Treatment Segments: 4 Vital Signs Capillary Blood Glucose Reference Range: 80 - 120 mg / dl HBO Diabetic Blood Glucose Intervention Range: <131 mg/dl or >893 mg/dl Time Capillary Blood Glucose Pulse Blood Respiratory Action Type: Vitals Pulse: Temperature: Glucose Meter Oximetry Pressure: Rate: Taken: Taken: (mg/dl): #: (%) Pre 81:01 751/02 90 18 98.3 165 1 none per protocol Gave Ensure per protocol; PA Post 10:34 104/72 60 18 98 77 1 notified Post 11:02 110 1 none per protocol; PA notified Treatment Response Treatment Toleration: Well Treatment Completion Treatment Completed without Adverse Event Status: Electronic Signature(s) Signed: 03/01/2021 11:07:55 AM By: Aleda Grana RCP, RRT, CHT Signed: 03/01/2021 6:00:47 PM By: Lenda Kelp PA-C Entered By: Aleda Grana on 03/01/2021 11:06:15 [name] [name] ([id number]) -------------------------------------------------------------------------------- HBO Safety Checklist Details Patient Name: [name] [name] Date of Service: 03/01/2021 8:00 AM Medical Record Number: [id number] Patient Account Number: [id number] Date of Birth/Sex: 1940/11/09 (80 y.o. M) Treating RN: Hansel Feinstein Primary Care Daielle Melcher: Lorenso Quarry Other Clinician: Izetta Dakin Referring Ranard Harte: Lorenso Quarry Treating Gaudencio Chesnut/Extender: Allen Derry Weeks in Treatment: 8 HBO Safety Checklist Items Safety Checklist Consent Form Signed Patient voided / foley secured and emptied When did you last eato 06:30 am Last dose of injectable or oral agent n/a Ostomy pouch emptied and vented if applicable NA All implantable devices assessed, documented and approved NA Intravenous access site secured and place NA Valuables secured Linens and cotton and cotton/polyester blend (less than 51% polyester) Personal oil-based products / skin lotions / body lotions removed Wigs or hairpieces removed NA Smoking or tobacco materials removed Books / newspapers / magazines / loose paper removed NA Cologne, aftershave, perfume and deodorant removed Jewelry removed (may wrap wedding band) Make-up removed NA Hair care products removed Battery operated devices (external) removed NA Heating patches and chemical warmers removed NA Titanium eyewear removed NA Nail polish cured greater than 10 hours NA Casting material cured greater than 10 hours NA Hearing aids removed NA Loose dentures or partials removed NA Prosthetics have been removed NA Patient demonstrates correct use of air break device (if applicable) Patient concerns have been addressed Patient grounding bracelet on and cord attached to chamber Specifics for  Inpatients (complete in addition to above) Medication sheet sent with patient Intravenous medications needed or due during therapy sent with patient Drainage tubes (e.g. nasogastric tube or chest tube secured and vented) Endotracheal or Tracheotomy tube secured Cuff deflated of air and inflated  with saline Airway suctioned Electronic Signature(s) Signed: 03/01/2021 11:07:55 AM By: Aleda Grana RCP, RRT, CHT Entered By: Aleda Grana on 03/01/2021 08:35:33

## 2021-03-03 ENCOUNTER — Encounter: Payer: Medicare HMO | Admitting: Physician Assistant

## 2021-03-03 DIAGNOSIS — E11622 Type 2 diabetes mellitus with other skin ulcer: Secondary | ICD-10-CM | POA: Diagnosis not present

## 2021-03-03 DIAGNOSIS — M86671 Other chronic osteomyelitis, right ankle and foot: Secondary | ICD-10-CM | POA: Diagnosis not present

## 2021-03-03 DIAGNOSIS — M869 Osteomyelitis, unspecified: Secondary | ICD-10-CM | POA: Diagnosis not present

## 2021-03-03 DIAGNOSIS — L97316 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 without evidence of necrosis: Secondary | ICD-10-CM | POA: Diagnosis not present

## 2021-03-03 DIAGNOSIS — E1169 Type 2 diabetes mellitus with other specified complication: Secondary | ICD-10-CM | POA: Diagnosis not present

## 2021-03-03 LAB — GLUCOSE, CAPILLARY
Glucose-Capillary: 206 mg/dL — ABNORMAL HIGH (ref 70–99)
Glucose-Capillary: 159 mg/dL — ABNORMAL HIGH (ref 70–99)

## 2021-03-03 NOTE — Progress Notes (Signed)
ZYRELL, CARMEAN ([id number]) Visit Report for 03/03/2021 Arrival Information Details Patient Name: [name] [name] Date of Service: 03/03/2021 8:00 AM Medical Record Number: [id number] Patient Account Number: [id number] Date of Birth/Sex: Jan 01, 1941 (80 y.o. M) Treating RN: Hansel Feinstein Primary Care Malley Hauter: Lorenso Quarry Other Clinician: Izetta Dakin Referring Rakiya Krawczyk: Lorenso Quarry Treating Kashus Karlen/Extender: Rowan Blase in Treatment: 8 Visit Information History Since Last Visit Added or deleted any medications: No Patient Arrived: Wheel Chair Any new allergies or adverse reactions: No Arrival Time: 08:40 Had a fall or experienced change in No Accompanied By: wife activities of daily living that may affect Transfer Assistance: None risk of falls: Patient Identification Verified: Yes Signs or symptoms of abuse/neglect since last visito No Secondary Verification Process Completed: Yes Hospitalized since last visit: No Patient Requires Transmission-Based No Implantable device outside of the clinic excluding No Precautions: cellular tissue based products placed in the center Patient Has Alerts: Yes since last visit: Patient Alerts: Patient on Blood Has Dressing in Place as Prescribed: Yes Thinner Pain Present Now: Yes XARELTO Diabetic ABI left .87 TBI 0.26 ABI right .62 TBI 0.23 Electronic Signature(s) Signed: 03/03/2021 11:46:05 AM By: Aleda Grana RCP, RRT, CHT Entered By: Aleda Grana on 03/03/2021 08:41:42 [name] [name] ([id number]) -------------------------------------------------------------------------------- Encounter Discharge Information Details Patient Name: [name] [name] Date of Service: 03/03/2021 8:00 AM Medical Record Number: [id number] Patient Account Number: [id number] Date of Birth/Sex: Sep 22, 1940 (80 y.o. M) Treating RN: Hansel Feinstein Primary Care Susanna Benge: Lorenso Quarry Other Clinician: Izetta Dakin Referring  Jessika Rothery: Lorenso Quarry Treating Jacinta Penalver/Extender: Rowan Blase in Treatment: 8 Encounter Discharge Information Items Discharge Condition: Stable Ambulatory Status: Wheelchair Discharge Destination: Home Transportation: Private Auto Accompanied By: wife Schedule Follow-up Appointment: Yes Clinical Summary of Care: Notes Patient has an HBO treatment scheduled on 03/04/21 at 08:00 am. Electronic Signature(s) Signed: 03/03/2021 11:46:05 AM By: Aleda Grana RCP, RRT, CHT Entered By: Aleda Grana on 03/03/2021 11:45:33 [name] [name] ([id number]) -------------------------------------------------------------------------------- Vitals Details Patient Name: [name] [name] Date of Service: 03/03/2021 8:00 AM Medical Record Number: [id number] Patient Account Number: [id number] Date of Birth/Sex: 11-Nov-1940 (80 y.o. M) Treating RN: Hansel Feinstein Primary Care Kilian Schwartz: Lorenso Quarry Other Clinician: Izetta Dakin Referring Deeann Servidio: Lorenso Quarry Treating Lalo Tromp/Extender: Allen Derry Weeks in Treatment: 8 Vital Signs Time Taken: 08:08 Temperature (F): 98.5 Height (in): 72 Pulse (bpm): 84 Weight (lbs): 178 Respiratory Rate (breaths/min): 18 Body Mass Index (BMI): 24.1 Blood Pressure (mmHg): 102/62 Capillary Blood Glucose (mg/dl): 330 Reference Range: 80 - 120 mg / dl Electronic Signature(s) Signed: 03/03/2021 11:46:05 AM By: Aleda Grana RCP, RRT, CHT Entered By: Aleda Grana on 03/03/2021 08:42:33

## 2021-03-03 NOTE — Progress Notes (Signed)
[name] [name], [name] [name] ([id number]) Visit Report for 03/03/2021 HBO Details Patient Name: [name] [name], [name] [name] Date of Service: 03/03/2021 8:00 AM Medical Record Number: [id number] Patient Account Number: [id number] Date of Birth/Sex: 22-Jan-1941 (80 y.o. 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 HBO Treatment Details Treatment Number: 4 Patient Type: Outpatient Chamber Type: Monoplace Chamber Serial #: [id number] Treatment Protocol: 2.0 ATA with 90 minutes oxygen, and no air breaks Treatment Details Compression Rate Down: 1.5 psi / minute De-Compression Rate Up: 1.5 psi / minute Compress Tx Pressure Air breaks and breathing periods Decompress Decompress Begins Reached (leave unused spaces blank) Begins Ends Chamber Pressure (ATA) 1 2 - - - - - - 2 1 Clock Time (24 hr) 08:37 08:49 - - - - - - 10:19 10:29 Treatment Length: 112 (minutes) Treatment Segments: 4 Vital Signs Capillary Blood Glucose Reference Range: 80 - 120 mg / dl HBO Diabetic Blood Glucose Intervention Range: <131 mg/dl or >696 mg/dl Capillary Time Glucose Pulse Blood Respiratory Blood Action Type: Vitals Pulse: Temperature: Meter Oximetry Pressure: Rate: Glucose Taken: Taken: #: (%) (mg/dl): Given Ensure per PA as patient had Pre 08:08 102/62 84 18 98.5 206 no AM protein Post 10:38 108/72 96 18 98.4 159 1 none per protocol Treatment Response Treatment Toleration: Well Treatment Completion Treatment Completed without Adverse Event Status: Electronic Signature(s) Signed: 03/03/2021 11:46:05 AM By: Aleda Grana RCP, RRT, CHT Signed:  03/03/2021 5:14:46 PM By: Lenda Kelp PA-C Entered By: Aleda Grana on 03/03/2021 11:44:26 [name] [name] ([id number]) -------------------------------------------------------------------------------- HBO Safety Checklist Details Patient Name: [name] [name] Date of Service: 03/03/2021 8:00 AM Medical Record Number: [id number] Patient Account Number: [id number] Date of Birth/Sex: [birth year] (80 y.o. M) Treating RN: Hansel Feinstein Primary Care Cheri Ayotte: Lorenso Quarry Other Clinician: Izetta Dakin Referring Madigan Rosensteel: Lorenso Quarry Treating Evadne Ose/Extender: Allen Derry Weeks in Treatment: 8 HBO Safety Checklist Items Safety Checklist Consent Form Signed Patient voided / foley secured and emptied When did you last eato 06:30 Last dose of injectable or oral agent n/a Ostomy pouch emptied and vented if applicable NA All implantable devices assessed, documented and approved NA Intravenous access site secured and place NA Valuables secured Linens and cotton and cotton/polyester blend (less than 51% polyester) Personal oil-based products / skin lotions / body lotions removed Wigs or hairpieces removed NA Smoking or tobacco materials removed Books / newspapers / magazines / loose paper removed NA Cologne, aftershave, perfume and deodorant removed Jewelry removed (may wrap wedding band) Make-up removed NA Hair care products removed Battery operated devices (external) removed NA Heating patches and chemical warmers removed NA Titanium eyewear removed NA Nail polish cured greater than 10 hours NA Casting material cured greater than 10 hours NA Hearing aids removed NA Loose dentures or partials removed NA Prosthetics have been removed NA Patient demonstrates correct use of air break device (if applicable) Patient concerns have been addressed Patient grounding bracelet on and cord attached to chamber Specifics for Inpatients (complete in addition  to above) Medication sheet sent with patient Intravenous medications needed or due during therapy sent with patient Drainage tubes (e.g. nasogastric tube or chest tube secured and vented) Endotracheal or Tracheotomy tube secured Cuff deflated of air and inflated with saline Airway suctioned Electronic Signature(s) Signed:  03/03/2021 11:46:05 AM By: Aleda Grana RCP, RRT, CHT Entered By: Aleda Grana on 03/03/2021 08:43:51

## 2021-03-03 NOTE — Progress Notes (Signed)
DENI, LEFEVER ([id number]) Visit Report for 03/02/2021 HBO Details Patient Name: [name] [name], [name] [name] Date of Service: 03/02/2021 8:00 AM Medical Record Number: [id number] Patient Account Number: [id number] Date of Birth/Sex: 11-05-1940 (80 y.o. 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 HBO Treatment Details Treatment Number: 3 Patient Type: Outpatient Chamber Type: Monoplace Chamber Serial #: [id number] Treatment Protocol: 2.0 ATA with 90 minutes oxygen, and no air breaks Treatment Details Compression Rate Down: 1.0 psi / minute De-Compression Rate Up: 1.5 psi / minute Compress Tx Pressure Air breaks and breathing periods Decompress Decompress Begins Reached (leave unused spaces blank) Begins Ends Chamber Pressure (ATA) 1 2 - - - - - - 2 1 Clock Time (24 hr) 08:27 08:43 - - - - - - 10:14 10:24 Treatment Length: 117 (minutes) Treatment Segments: 4 Vital Signs Capillary Blood Glucose Reference Range: 80 - 120 mg / dl HBO Diabetic Blood Glucose Intervention Range: <131 mg/dl or >295 mg/dl Time Vitals Blood Respiratory Capillary Blood Glucose Pulse Action Type: Pulse: Temperature: Taken: Pressure: Rate: Glucose (mg/dl): Meter #: Oximetry (%) Taken: Pre 08:07 122/68 90 18 98.6 220 1 none per protocol Post 10:34 104/72 84 18 98.3 143 1 none per protocol Treatment Response Treatment Toleration: Well Treatment Completion Treatment Completed without Adverse Event Status: HBO Attestation I certify that I supervised this HBO treatment in accordance with Medicare guidelines. A trained emergency response team  is readily Yes available per hospital policies and procedures. Continue HBOT as ordered. Yes Electronic Signature(s) Signed: 03/02/2021 12:57:11 PM By: Geralyn Corwin DO Entered By: Geralyn Corwin on 03/02/2021 12:56:47 [name] [name] ([id number]) -------------------------------------------------------------------------------- HBO Safety Checklist Details Patient Name: [name] [name] Date of Service: 03/02/2021 8:00 AM Medical Record Number: [id number] Patient Account Number: [id number] Date of Birth/Sex: [birth year] (80 y.o. M) Treating RN: Hansel Feinstein Primary Care Yulianna Folse: Lorenso Quarry Other Clinician: Izetta Dakin Referring Burdett Pinzon: Lorenso Quarry Treating Paco Cislo/Extender: Tilda Franco in Treatment: 8 HBO Safety Checklist Items Safety Checklist Consent Form Signed Patient voided / foley secured and emptied When did you last eato 07:30 am (Ensure) Last dose of injectable or oral agent n/a Ostomy pouch emptied and vented if applicable NA All implantable devices assessed, documented and approved NA Intravenous access site secured and place NA Valuables secured Linens and cotton and cotton/polyester blend (less than 51% polyester) Personal oil-based products / skin lotions / body lotions removed Wigs or hairpieces removed NA Smoking or tobacco materials removed Books / newspapers / magazines / loose paper removed NA Cologne, aftershave, perfume and deodorant removed Jewelry removed (may wrap wedding band) Make-up removed NA Hair care products removed Battery operated devices (external) removed NA Heating patches and chemical warmers removed NA Titanium eyewear removed NA Nail polish cured greater than 10 hours NA Casting material cured greater than 10 hours NA Hearing aids removed NA Loose dentures or partials removed NA Prosthetics have been removed NA Patient demonstrates correct use of air break device (if applicable) Patient concerns have  been addressed Patient grounding bracelet on and cord attached to chamber Specifics for Inpatients (complete in addition to above) Medication sheet sent with patient Intravenous medications needed or due during therapy sent with patient Drainage tubes (e.g. nasogastric tube or chest tube secured and vented) Endotracheal  or Tracheotomy tube secured Cuff deflated of air and inflated with saline Airway suctioned Electronic Signature(s) Signed: 03/03/2021 11:46:05 AM By: Aleda Grana RCP, RRT, CHT Entered By: Drake Leach, Lucio Edward on 03/02/2021 08:49:23

## 2021-03-03 NOTE — Progress Notes (Signed)
KAILIN, LEU ([id number]) Visit Report for 03/02/2021 Arrival Information Details Patient Name: [name] [name], [name] [name] Date of Service: 03/02/2021 8:00 AM Medical Record Number: [id number] Patient Account Number: [id number] Date of Birth/Sex: Jan 30, 1941 (80 y.o. M) Treating RN: Hansel Feinstein Primary Care Ashyr Hedgepath: Lorenso Quarry Other Clinician: Izetta Dakin Referring Jusitn Salsgiver: Lorenso Quarry Treating Tamarion Haymond/Extender: Tilda Franco in Treatment: 8 Visit Information History Since Last Visit Added or deleted any medications: No Patient Arrived: Wheel Chair Any new allergies or adverse reactions: No Arrival Time: 08:00 Had a fall or experienced change in No Accompanied By: wife activities of daily living that may affect Transfer Assistance: Manual risk of falls: Patient Identification Verified: Yes Signs or symptoms of abuse/neglect since last visito No Secondary Verification Process Completed: Yes Hospitalized since last visit: No Patient Requires Transmission-Based No Implantable device outside of the clinic excluding No Precautions: cellular tissue based products placed in the center Patient Has Alerts: Yes since last visit: Patient Alerts: Patient on Blood Pain Present Now: Yes Thinner XARELTO Diabetic ABI left .87 TBI 0.26 ABI right .62 TBI 0.23 Electronic Signature(s) Signed: 03/03/2021 11:46:05 AM By: Aleda Grana RCP, RRT, CHT Entered By: Aleda Grana on 03/02/2021 08:35:12 [name] [name] ([id number]) -------------------------------------------------------------------------------- Encounter Discharge Information Details Patient Name: [name] [name] Date of Service: 03/02/2021 8:00 AM Medical Record Number: [id number] Patient Account Number: [id number] Date of Birth/Sex: 03-19-1941 (80 y.o. M) Treating RN: Hansel Feinstein Primary Care Robby Bulkley: Lorenso Quarry Other Clinician: Izetta Dakin Referring Tabathia Knoche: Lorenso Quarry Treating  Sundi Slevin/Extender: Tilda Franco in Treatment: 8 Encounter Discharge Information Items Discharge Condition: Stable Ambulatory Status: Wheelchair Discharge Destination: Home Transportation: Private Auto Accompanied By: wife Schedule Follow-up Appointment: Yes Clinical Summary of Care: Notes Patient has an HBO treatment scheduled on 03/03/21 at 08:00 am. Electronic Signature(s) Signed: 03/03/2021 11:46:05 AM By: Aleda Grana RCP, RRT, CHT Entered By: Aleda Grana on 03/02/2021 13:15:55 [name] [name] ([id number]) -------------------------------------------------------------------------------- Vitals Details Patient Name: [name] [name] Date of Service: 03/02/2021 8:00 AM Medical Record Number: [id number] Patient Account Number: [id number] Date of Birth/Sex: 01-27-1941 (80 y.o. M) Treating RN: Hansel Feinstein Primary Care Marisel Tostenson: Lorenso Quarry Other Clinician: Izetta Dakin Referring Deano Tomaszewski: Lorenso Quarry Treating Jezebelle Ledwell/Extender: Tilda Franco in Treatment: 8 Vital Signs Time Taken: 08:07 Temperature (F): 98.6 Height (in): 72 Pulse (bpm): 90 Weight (lbs): 178 Respiratory Rate (breaths/min): 18 Body Mass Index (BMI): 24.1 Blood Pressure (mmHg): 122/68 Capillary Blood Glucose (mg/dl): 124 Reference Range: 80 - 120 mg / dl Electronic Signature(s) Signed: 03/03/2021 11:46:05 AM By: Aleda Grana RCP, RRT, CHT Entered By: Aleda Grana on 03/02/2021 08:35:49

## 2021-03-04 ENCOUNTER — Other Ambulatory Visit: Payer: Self-pay

## 2021-03-04 ENCOUNTER — Encounter: Payer: Medicare HMO | Admitting: Physician Assistant

## 2021-03-04 DIAGNOSIS — E1169 Type 2 diabetes mellitus with other specified complication: Secondary | ICD-10-CM | POA: Diagnosis not present

## 2021-03-04 DIAGNOSIS — L97316 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 without evidence of necrosis: Secondary | ICD-10-CM | POA: Diagnosis not present

## 2021-03-04 DIAGNOSIS — M86671 Other chronic osteomyelitis, right ankle and foot: Secondary | ICD-10-CM | POA: Diagnosis not present

## 2021-03-04 DIAGNOSIS — E11622 Type 2 diabetes mellitus with other skin ulcer: Secondary | ICD-10-CM | POA: Diagnosis not present

## 2021-03-04 DIAGNOSIS — M869 Osteomyelitis, unspecified: Secondary | ICD-10-CM | POA: Diagnosis not present

## 2021-03-04 LAB — GLUCOSE, CAPILLARY
Glucose-Capillary: 218 mg/dL — ABNORMAL HIGH (ref 70–99)
Glucose-Capillary: 99 mg/dL (ref 70–99)

## 2021-03-04 NOTE — Progress Notes (Signed)
MARQUIES, WANAT ([id number]) Visit Report for 03/04/2021 Arrival Information Details Patient Name: JAQUAWN, SAFFRAN Date of Service: 03/04/2021 8:00 AM Medical Record Number: [id number] Patient Account Number: [id number] Date of Birth/Sex: 1940-09-01 (80 y.o. M) Treating RN: Hansel Feinstein Primary Care Brietta Manso: Evangeline Gula, Carollee Herter Other Clinician: Izetta Dakin Referring Jose Corvin: Evangeline Gula, Carollee Herter Treating Samiel Peel/Extender: Rowan Blase in Treatment: 8 Visit Information History Since Last Visit Added or deleted any medications: No Patient Arrived: Wheel Chair Any new allergies or adverse reactions: No Arrival Time: 08:05 Had a fall or experienced change in No Accompanied By: wife activities of daily living that may affect Transfer Assistance: Manual risk of falls: Patient Identification Verified: Yes Signs or symptoms of abuse/neglect since last visito No Secondary Verification Process Completed: Yes Hospitalized since last visit: No Patient Requires Transmission-Based No Implantable device outside of the clinic excluding No Precautions: cellular tissue based products placed in the center Patient Has Alerts: Yes since last visit: Patient Alerts: Patient on Blood Has Dressing in Place as Prescribed: Yes Thinner Pain Present Now: No XARELTO Diabetic ABI left .87 TBI 0.26 ABI right .62 TBI 0.23 Electronic Signature(s) Signed: 03/04/2021 12:32:30 PM By: Aleda Grana RCP, RRT, CHT Entered By: Aleda Grana on 03/04/2021 09:00:53 [name] ([id number]) -------------------------------------------------------------------------------- Encounter Discharge Information Details Patient Name: [name] Date of Service: 03/04/2021 8:00 AM Medical Record Number: [id number] Patient Account Number: [id number] Date of Birth/Sex: 1940-12-22 (80 y.o. M) Treating RN: Hansel Feinstein Primary Care Kristi Norment: Evangeline Gula, Carollee Herter Other Clinician: Izetta Dakin Referring Madhavi Hamblen: Evangeline Gula, Carollee Herter Treating Vi Whitesel/Extender: Rowan Blase in Treatment: 8 Encounter Discharge Information Items Discharge Condition: Stable Ambulatory Status: Wheelchair Discharge Destination: Home Transportation: Private Auto Accompanied By: wife Schedule Follow-up Appointment: Yes Clinical Summary of Care: Notes Patient has an HBO treatment scheduled on 03/07/21 at 08:00 am. Electronic Signature(s) Signed: 03/04/2021 12:32:30 PM By: Aleda Grana RCP, RRT, CHT Entered By: Aleda Grana on 03/04/2021 11:36:40 [name] ([id number]) -------------------------------------------------------------------------------- Vitals Details Patient Name: [name] Date of Service: 03/04/2021 8:00 AM Medical Record Number: [id number] Patient Account Number: [id number] Date of Birth/Sex: [birth year] (80 y.o. M) Treating RN: Hansel Feinstein Primary Care Jasin Brazel: Evangeline Gula, Carollee Herter Other Clinician: Izetta Dakin Referring Javante Nilsson: Evangeline Gula, Carollee Herter Treating Maxamillian Tienda/Extender: Allen Derry Weeks in Treatment: 8 Vital Signs Time Taken: 08:05 Temperature (F): 98.5 Height (in): 72 Pulse (bpm): 90 Weight (lbs): 178 Respiratory Rate (breaths/min): 18 Body Mass Index (BMI): 24.1 Blood Pressure (mmHg): 122/66 Capillary Blood Glucose (mg/dl): 226 Reference Range: 80 - 120 mg / dl Electronic Signature(s) Signed: 03/04/2021 12:32:30 PM By: Aleda Grana RCP, RRT, CHT Entered By: Aleda Grana on 03/04/2021 09:02:25

## 2021-03-04 NOTE — Progress Notes (Signed)
CHANG, TIGGS ([id number]) Visit Report for 03/04/2021 HBO Details Patient Name: [name] [name], [name] [name] Date of Service: 03/04/2021 8:00 AM Medical Record Number: [id number] Patient Account Number: [id number] Date of Birth/Sex: 1940/12/07 (80 y.o. 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 HBO Treatment Details Treatment Number: 5 Patient Type: Outpatient Chamber Type: Monoplace Chamber Serial #: [id number] Treatment Protocol: 2.0 ATA with 90 minutes oxygen, and no air breaks Treatment Details Compression Rate Down: 1.5 psi / minute De-Compression Rate Up: 1.5 psi / minute Compress Tx Pressure Air breaks and breathing periods Decompress Decompress Begins Reached (leave unused spaces blank) Begins Ends Chamber Pressure (ATA) 1 2 - - - - - - 2 1 Clock Time (24 hr) 08:31 08:43 - - - - - - 10:13 10:21 Treatment Length: 110 (minutes) Treatment Segments: 4 Vital Signs Capillary Blood Glucose Reference Range: 80 - 120 mg / dl HBO Diabetic Blood Glucose Intervention Range: <131 mg/dl or >176 mg/dl Time Capillary Blood Glucose Pulse Blood Respiratory Action Type: Vitals Pulse: Temperature: Glucose Meter Oximetry Pressure: Rate: Taken: Taken: (mg/dl): #: (%) Pre 16:07 371/06 90 18 98.5 218 1 none per protocol Gave Ensure per protocol; PA made Post 10:32 122/72 90 18 98.4 99 1 aware Treatment Response Treatment Toleration: Well Treatment Completion Treatment Completed without Adverse Event Status: Electronic Signature(s) Signed: 03/04/2021 12:32:30 PM By: Aleda Grana RCP, RRT,  CHT Signed: 03/04/2021 4:22:43 PM By: Lenda Kelp PA-C Entered By: Aleda Grana on 03/04/2021 11:35:35 [name] [name] ([id number]) -------------------------------------------------------------------------------- HBO Safety Checklist Details Patient Name: [name] [name] Date of Service: 03/04/2021 8:00 AM Medical Record Number: [id number] Patient Account Number: [id number] Date of Birth/Sex: Feb 27, 1941 (80 y.o. M) Treating RN: Hansel Feinstein Primary Care Chenelle Benning: Evangeline Gula, Carollee Herter Other Clinician: Izetta Dakin Referring Jauan Wohl: Evangeline Gula, Carollee Herter Treating Kemiyah Tarazon/Extender: Allen Derry Weeks in Treatment: 8 HBO Safety Checklist Items Safety Checklist Consent Form Signed Patient voided / foley secured and emptied When did you last eato 06:30 am Last dose of injectable or oral agent n/a Ostomy pouch emptied and vented if applicable NA All implantable devices assessed, documented and approved NA Intravenous access site secured and place NA Valuables secured Linens and cotton and cotton/polyester blend (less than 51% polyester) Personal oil-based products / skin lotions / body lotions removed Wigs or hairpieces removed NA Smoking or tobacco materials removed Books / newspapers / magazines / loose paper removed NA Cologne, aftershave, perfume and deodorant removed Jewelry removed (may wrap wedding band) Make-up removed NA Hair care products removed Battery operated devices (external) removed NA Heating patches and chemical warmers removed NA Titanium eyewear removed NA Nail polish cured greater than 10 hours NA Casting material cured greater than 10 hours NA Hearing aids removed NA Loose dentures or partials removed NA Prosthetics have been removed NA Patient demonstrates correct use of air break device (if applicable) Patient concerns have been addressed Patient grounding bracelet on and cord attached to chamber Specifics for Inpatients  (complete in addition to above) Medication sheet sent with patient Intravenous medications needed or due during therapy sent with patient Drainage tubes (e.g. nasogastric tube or chest tube secured and vented) Endotracheal or Tracheotomy tube secured Cuff deflated of air and inflated with saline Airway suctioned  Electronic Signature(s) Signed: 03/04/2021 12:32:30 PM By: Aleda Grana RCP, RRT, CHT Entered By: Aleda Grana on 03/04/2021 10:15:24

## 2021-03-07 ENCOUNTER — Other Ambulatory Visit: Payer: Self-pay

## 2021-03-07 ENCOUNTER — Encounter: Payer: Medicare HMO | Admitting: Physician Assistant

## 2021-03-07 DIAGNOSIS — I5032 Chronic diastolic (congestive) heart failure: Secondary | ICD-10-CM | POA: Diagnosis present

## 2021-03-07 DIAGNOSIS — E871 Hypo-osmolality and hyponatremia: Secondary | ICD-10-CM | POA: Diagnosis present

## 2021-03-07 DIAGNOSIS — M00071 Staphylococcal arthritis, right ankle and foot: Secondary | ICD-10-CM | POA: Diagnosis not present

## 2021-03-07 DIAGNOSIS — F17220 Nicotine dependence, chewing tobacco, uncomplicated: Secondary | ICD-10-CM | POA: Diagnosis present

## 2021-03-07 DIAGNOSIS — Z20822 Contact with and (suspected) exposure to covid-19: Secondary | ICD-10-CM | POA: Diagnosis present

## 2021-03-07 DIAGNOSIS — Z9114 Patient's other noncompliance with medication regimen: Secondary | ICD-10-CM | POA: Diagnosis not present

## 2021-03-07 DIAGNOSIS — E872 Acidosis, unspecified: Secondary | ICD-10-CM | POA: Diagnosis present

## 2021-03-07 DIAGNOSIS — E46 Unspecified protein-calorie malnutrition: Secondary | ICD-10-CM | POA: Diagnosis not present

## 2021-03-07 DIAGNOSIS — I509 Heart failure, unspecified: Secondary | ICD-10-CM | POA: Diagnosis not present

## 2021-03-07 DIAGNOSIS — D500 Iron deficiency anemia secondary to blood loss (chronic): Secondary | ICD-10-CM | POA: Diagnosis not present

## 2021-03-07 DIAGNOSIS — I110 Hypertensive heart disease with heart failure: Secondary | ICD-10-CM | POA: Diagnosis present

## 2021-03-07 DIAGNOSIS — I96 Gangrene, not elsewhere classified: Secondary | ICD-10-CM | POA: Diagnosis not present

## 2021-03-07 DIAGNOSIS — R9431 Abnormal electrocardiogram [ECG] [EKG]: Secondary | ICD-10-CM | POA: Diagnosis not present

## 2021-03-07 DIAGNOSIS — Z6825 Body mass index (BMI) 25.0-25.9, adult: Secondary | ICD-10-CM | POA: Diagnosis not present

## 2021-03-07 DIAGNOSIS — Z89611 Acquired absence of right leg above knee: Secondary | ICD-10-CM | POA: Diagnosis not present

## 2021-03-07 DIAGNOSIS — Z79899 Other long term (current) drug therapy: Secondary | ICD-10-CM | POA: Diagnosis not present

## 2021-03-07 DIAGNOSIS — M868X7 Other osteomyelitis, ankle and foot: Secondary | ICD-10-CM | POA: Diagnosis not present

## 2021-03-07 DIAGNOSIS — E8809 Other disorders of plasma-protein metabolism, not elsewhere classified: Secondary | ICD-10-CM | POA: Diagnosis present

## 2021-03-07 DIAGNOSIS — E119 Type 2 diabetes mellitus without complications: Secondary | ICD-10-CM | POA: Diagnosis not present

## 2021-03-07 DIAGNOSIS — M86171 Other acute osteomyelitis, right ankle and foot: Secondary | ICD-10-CM | POA: Diagnosis not present

## 2021-03-07 DIAGNOSIS — E11621 Type 2 diabetes mellitus with foot ulcer: Secondary | ICD-10-CM | POA: Diagnosis present

## 2021-03-07 DIAGNOSIS — Z4781 Encounter for orthopedic aftercare following surgical amputation: Secondary | ICD-10-CM | POA: Diagnosis not present

## 2021-03-07 DIAGNOSIS — E782 Mixed hyperlipidemia: Secondary | ICD-10-CM | POA: Diagnosis present

## 2021-03-07 DIAGNOSIS — D72828 Other elevated white blood cell count: Secondary | ICD-10-CM | POA: Diagnosis not present

## 2021-03-07 DIAGNOSIS — Z23 Encounter for immunization: Secondary | ICD-10-CM | POA: Diagnosis present

## 2021-03-07 DIAGNOSIS — E785 Hyperlipidemia, unspecified: Secondary | ICD-10-CM | POA: Diagnosis not present

## 2021-03-07 DIAGNOSIS — I959 Hypotension, unspecified: Secondary | ICD-10-CM | POA: Diagnosis not present

## 2021-03-07 DIAGNOSIS — M86671 Other chronic osteomyelitis, right ankle and foot: Secondary | ICD-10-CM | POA: Diagnosis not present

## 2021-03-07 DIAGNOSIS — D638 Anemia in other chronic diseases classified elsewhere: Secondary | ICD-10-CM | POA: Diagnosis not present

## 2021-03-07 DIAGNOSIS — E1169 Type 2 diabetes mellitus with other specified complication: Secondary | ICD-10-CM | POA: Diagnosis present

## 2021-03-07 DIAGNOSIS — I739 Peripheral vascular disease, unspecified: Secondary | ICD-10-CM | POA: Diagnosis not present

## 2021-03-07 DIAGNOSIS — R7309 Other abnormal glucose: Secondary | ICD-10-CM | POA: Diagnosis not present

## 2021-03-07 DIAGNOSIS — E440 Moderate protein-calorie malnutrition: Secondary | ICD-10-CM | POA: Diagnosis present

## 2021-03-07 DIAGNOSIS — D62 Acute posthemorrhagic anemia: Secondary | ICD-10-CM | POA: Diagnosis not present

## 2021-03-07 DIAGNOSIS — D509 Iron deficiency anemia, unspecified: Secondary | ICD-10-CM | POA: Diagnosis present

## 2021-03-07 DIAGNOSIS — I482 Chronic atrial fibrillation, unspecified: Secondary | ICD-10-CM | POA: Diagnosis present

## 2021-03-07 DIAGNOSIS — I5030 Unspecified diastolic (congestive) heart failure: Secondary | ICD-10-CM | POA: Diagnosis not present

## 2021-03-07 DIAGNOSIS — A419 Sepsis, unspecified organism: Secondary | ICD-10-CM | POA: Diagnosis not present

## 2021-03-07 DIAGNOSIS — J45909 Unspecified asthma, uncomplicated: Secondary | ICD-10-CM | POA: Diagnosis not present

## 2021-03-07 DIAGNOSIS — I4811 Longstanding persistent atrial fibrillation: Secondary | ICD-10-CM | POA: Diagnosis not present

## 2021-03-07 DIAGNOSIS — S91001A Unspecified open wound, right ankle, initial encounter: Secondary | ICD-10-CM | POA: Diagnosis not present

## 2021-03-07 DIAGNOSIS — M869 Osteomyelitis, unspecified: Secondary | ICD-10-CM | POA: Diagnosis not present

## 2021-03-07 DIAGNOSIS — I700 Atherosclerosis of aorta: Secondary | ICD-10-CM | POA: Diagnosis not present

## 2021-03-07 DIAGNOSIS — E1165 Type 2 diabetes mellitus with hyperglycemia: Secondary | ICD-10-CM | POA: Diagnosis present

## 2021-03-07 DIAGNOSIS — Z7982 Long term (current) use of aspirin: Secondary | ICD-10-CM | POA: Diagnosis not present

## 2021-03-07 DIAGNOSIS — M7989 Other specified soft tissue disorders: Secondary | ICD-10-CM | POA: Diagnosis not present

## 2021-03-07 DIAGNOSIS — Z8679 Personal history of other diseases of the circulatory system: Secondary | ICD-10-CM | POA: Diagnosis not present

## 2021-03-07 DIAGNOSIS — M009 Pyogenic arthritis, unspecified: Secondary | ICD-10-CM | POA: Diagnosis present

## 2021-03-07 DIAGNOSIS — I70201 Unspecified atherosclerosis of native arteries of extremities, right leg: Secondary | ICD-10-CM | POA: Diagnosis not present

## 2021-03-07 DIAGNOSIS — I998 Other disorder of circulatory system: Secondary | ICD-10-CM | POA: Diagnosis not present

## 2021-03-07 DIAGNOSIS — M00879 Arthritis due to other bacteria, unspecified ankle and foot: Secondary | ICD-10-CM | POA: Diagnosis not present

## 2021-03-07 DIAGNOSIS — R279 Unspecified lack of coordination: Secondary | ICD-10-CM | POA: Diagnosis not present

## 2021-03-07 DIAGNOSIS — E1152 Type 2 diabetes mellitus with diabetic peripheral angiopathy with gangrene: Secondary | ICD-10-CM | POA: Diagnosis present

## 2021-03-07 DIAGNOSIS — D72829 Elevated white blood cell count, unspecified: Secondary | ICD-10-CM | POA: Diagnosis not present

## 2021-03-07 DIAGNOSIS — Z743 Need for continuous supervision: Secondary | ICD-10-CM | POA: Diagnosis not present

## 2021-03-07 DIAGNOSIS — I10 Essential (primary) hypertension: Secondary | ICD-10-CM | POA: Diagnosis not present

## 2021-03-07 LAB — GLUCOSE, CAPILLARY
Glucose-Capillary: 159 mg/dL — ABNORMAL HIGH (ref 70–99)
Glucose-Capillary: 152 mg/dL — ABNORMAL HIGH (ref 70–99)

## 2021-03-07 NOTE — Progress Notes (Addendum)
ESCO, JOSLYN ([id number]) Visit Report for 03/07/2021 Arrival Information Details Patient Name: [name] [name], [name] [name] Date of Service: 03/07/2021 8:00 AM Medical Record Number: [id number] Patient Account Number: [id number] Date of Birth/Sex: 1941-01-08 (80 y.o. M) Treating RN: Huel Coventry Primary Care Elster Corbello: Evangeline Gula, Carollee Herter Other Clinician: Referring Jahaan Vanwagner: Evangeline Gula, Carollee Herter Treating Edwardine Deschepper/Extender: Rowan Blase in Treatment: 9 Visit Information History Since Last Visit Added or deleted any medications: No Patient Arrived: Wheel Chair Has Dressing in Place as Prescribed: Yes Arrival Time: 08:50 Pain Present Now: Yes Accompanied By: wife Transfer Assistance: None Patient Identification Verified: Yes Secondary Verification Process Completed: Yes Patient Requires Transmission-Based No Precautions: Patient Has Alerts: Yes Patient Alerts: Patient on Blood Thinner XARELTO Diabetic ABI left .87 TBI 0.26 ABI right .62 TBI 0.23 Electronic Signature(s) Signed: 03/07/2021 9:33:48 AM By: Elliot Gurney, BSN, RN, CWS, Kim RN, BSN Entered By: Elliot Gurney, BSN, RN, CWS, Kim on 03/07/2021 09:33:47 [name] [name] ([id number]) -------------------------------------------------------------------------------- Encounter Discharge Information Details Patient Name: [name] [name] Date of Service: 03/07/2021 8:00 AM Medical Record Number: [id number] Patient Account Number: [id number] Date of Birth/Sex: 01-02-1941 (80 y.o. M) Treating RN: Huel Coventry Primary Care Atiana Levier: Evangeline Gula, Carollee Herter Other Clinician: Referring Dillen Belmontes: Evangeline Gula, Carollee Herter Treating Muaaz Brau/Extender: Rowan Blase in Treatment: 9 Encounter Discharge Information Items Discharge Condition: Stable Ambulatory Status: Wheelchair Discharge Destination: Home Transportation: Private Auto Accompanied By: wife Schedule Follow-up Appointment: Yes Clinical Summary of Care: Electronic Signature(s) Signed: 03/07/2021 11:43:49  AM By: Elliot Gurney, BSN, RN, CWS, Kim RN, BSN Entered By: Elliot Gurney, BSN, RN, CWS, Kim on 03/07/2021 11:43:49 [name] [name] ([id number]) -------------------------------------------------------------------------------- Pain Assessment Details Patient Name: [name] [name] Date of Service: 03/07/2021 8:00 AM Medical Record Number: [id number] Patient Account Number: [id number] Date of Birth/Sex: 1940/09/21 (80 y.o. M) Treating RN: Huel Coventry Primary Care Noboru Bidinger: Evangeline Gula, Carollee Herter Other Clinician: Referring Genna Casimir: Evangeline Gula, Carollee Herter Treating Ron Junco/Extender: Allen Derry Weeks in Treatment: 9 Active Problems Location of Pain Severity and Description of Pain Patient Has Paino Yes Site Locations Pain Location: Pain in Ulcers Rate the pain. Current Pain Level: 4 Pain Management and Medication Current Pain Management: Notes states he had pain last night. Electronic Signature(s) Signed: 03/07/2021 9:34:36 AM By: Elliot Gurney, BSN, RN, CWS, Kim RN, BSN Entered By: Elliot Gurney, BSN, RN, CWS, Kim on 03/07/2021 09:34:36 [name] [name] ([id number]) -------------------------------------------------------------------------------- Vitals Details Patient Name: [name] [name] Date of Service: 03/07/2021 8:00 AM Medical Record Number: [id number] Patient Account Number: [id number] Date of Birth/Sex: 1940/05/26 (80 y.o. M) Treating RN: Huel Coventry Primary Care Keeshawn Fakhouri: Evangeline Gula, Carollee Herter Other Clinician: Referring Ahmere Hemenway: Evangeline Gula, Carollee Herter Treating Kamron Portee/Extender: Allen Derry Weeks in Treatment: 9 Vital Signs Time Taken: 08:52 Temperature (F): 97.8 Height (in): 72 Pulse (bpm): 68 Weight (lbs): 178 Respiratory Rate (breaths/min): 18 Body Mass Index (BMI): 24.1 Blood Pressure (mmHg): 128/68 Capillary Blood Glucose (mg/dl): 202 Reference Range: 80 - 120 mg / dl Electronic Signature(s) Signed: 03/07/2021 9:35:25 AM By: Elliot Gurney, BSN, RN, CWS, Kim RN, BSN Entered By: Elliot Gurney, BSN, RN, CWS, Kim on  03/07/2021 09:35:25

## 2021-03-07 NOTE — Progress Notes (Addendum)
TACARI, REPASS ([id number]) Visit Report for 03/07/2021 HBO Details Patient Name: [name] [name], [name] [name] Date of Service: 03/07/2021 8:00 AM Medical Record Number: [id number] Patient Account Number: [id number] Date of Birth/Sex: Nov 02, 1940 (80 y.o. 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 HBO Treatment Details Treatment Number: 6 Patient Type: Outpatient Chamber Type: Monoplace Chamber Serial #: [id number] Treatment Protocol: 2.0 ATA with 90 minutes oxygen, and no air breaks Treatment Details Compression Rate Down: 1.5 psi / minute De-Compression Rate Up: 1.5 psi / minute Compress Tx Pressure Air breaks and breathing periods Decompress Decompress Begins Reached (leave unused spaces blank) Begins Ends Chamber Pressure (ATA) 1 2 - - - - - - 2 1 Clock Time (24 hr) 09:18 09:29 - - - - - - 11:00 11:11 Treatment Length: 113 (minutes) Treatment Segments: 4 Vital Signs Capillary Blood Glucose Reference Range: 80 - 120 mg / dl HBO Diabetic Blood Glucose Intervention Range: <131 mg/dl or >416 mg/dl Time Vitals Blood Respiratory Capillary Blood Glucose Pulse Action Type: Pulse: Temperature: Taken: Pressure: Rate: Glucose (mg/dl): Meter #: Oximetry (%) Taken: Pre 08:52 128/68 68 18 97.8 159 started treatment Post 11:15 138/68 72 18 98.1 159 Treatment Response Treatment Toleration: Well Treatment Completion Treatment Completed without Adverse Event Status: Treatment Notes Patient's blood sugar at 159, due to patient history of glucose dropping after treatment an Ensure was given to the patient before entering  the chamber. Electronic Signature(s) Signed: 03/07/2021 11:41:53 AM By: Elliot Gurney, BSN, RN, CWS, Kim RN, BSN Signed: 03/07/2021 5:26:28 PM By: Lenda Kelp PA-C Previous Signature: 03/07/2021 11:11:39 AM Version By: Elliot Gurney BSN, RN, CWS, Kim RN, BSN Previous Signature: 03/07/2021 9:39:09 AM Version By: Elliot Gurney, BSN, RN, CWS, Kim RN, BSN Entered By: Elliot Gurney, BSN, RN, CWS, Kim on 03/07/2021 11:41:53 [name] [name] ([id number]) -------------------------------------------------------------------------------- HBO Safety Checklist Details Patient Name: [name] [name] Date of Service: 03/07/2021 8:00 AM Medical Record Number: [id number] Patient Account Number: [id number] Date of Birth/Sex: Sep 15, 1940 (80 y.o. M) Treating RN: Huel Coventry Primary Care Divinity Kyler: Evangeline Gula, Carollee Herter Other Clinician: Referring Jessel Gettinger: Evangeline Gula, Carollee Herter Treating Bobbijo Holst/Extender: Allen Derry Weeks in Treatment: 9 HBO Safety Checklist Items Safety Checklist Consent Form Signed Patient voided / foley secured and emptied When did you last eato am Last dose of injectable or oral agent pm Ostomy pouch emptied and vented if applicable NA All implantable devices assessed, documented and approved NA Intravenous access site secured and place NA Valuables secured Linens and cotton and cotton/polyester blend (less than 51% polyester) Personal oil-based products / skin lotions / body lotions removed NA Wigs or hairpieces removed NA Smoking or tobacco materials removed NA Books / newspapers / magazines / loose paper removed NA Cologne, aftershave, perfume and deodorant removed Jewelry removed (may wrap wedding band) NA Make-up removed NA Hair care products removed NA Battery operated devices (external) removed NA Heating patches and chemical warmers removed NA Titanium eyewear removed NA Nail polish cured greater than 10 hours NA Casting material cured greater than 10 hours NA Hearing aids  removed NA Loose dentures or partials removed Prosthetics have been removed NA Patient demonstrates correct use of air break device (if applicable) Patient concerns have been addressed Patient grounding bracelet on and cord attached to chamber Specifics for Inpatients (  complete in addition to above) Medication sheet sent with patient Intravenous medications needed or due during therapy sent with patient Drainage tubes (e.g. nasogastric tube or chest tube secured and vented) Endotracheal or Tracheotomy tube secured Cuff deflated of air and inflated with saline Airway suctioned Electronic Signature(s) Signed: 03/07/2021 9:36:34 AM By: Elliot Gurney, BSN, RN, CWS, Kim RN, BSN Entered By: Elliot Gurney, BSN, RN, CWS, Kim on 03/07/2021 09:36:33

## 2021-03-08 ENCOUNTER — Inpatient Hospital Stay
Admission: EM | Admit: 2021-03-08 | Discharge: 2021-03-16 | DRG: 240 | Disposition: A | Discharge status: Skilled Nursing Facility | Payer: Medicare HMO | Attending: Internal Medicine | Admitting: Internal Medicine

## 2021-03-08 ENCOUNTER — Other Ambulatory Visit: Payer: Self-pay

## 2021-03-08 ENCOUNTER — Ambulatory Visit: Payer: Medicare HMO | Admitting: Physician Assistant

## 2021-03-08 ENCOUNTER — Encounter: Payer: Medicare HMO | Admitting: Physician Assistant

## 2021-03-08 ENCOUNTER — Emergency Department: Payer: Medicare HMO

## 2021-03-08 ENCOUNTER — Other Ambulatory Visit (INDEPENDENT_AMBULATORY_CARE_PROVIDER_SITE_OTHER): Payer: Self-pay | Admitting: Vascular Surgery

## 2021-03-08 DIAGNOSIS — E872 Acidosis, unspecified: Secondary | ICD-10-CM | POA: Diagnosis present

## 2021-03-08 DIAGNOSIS — E8809 Other disorders of plasma-protein metabolism, not elsewhere classified: Secondary | ICD-10-CM | POA: Diagnosis present

## 2021-03-08 DIAGNOSIS — I110 Hypertensive heart disease with heart failure: Secondary | ICD-10-CM | POA: Diagnosis present

## 2021-03-08 DIAGNOSIS — D72829 Elevated white blood cell count, unspecified: Secondary | ICD-10-CM | POA: Diagnosis not present

## 2021-03-08 DIAGNOSIS — M869 Osteomyelitis, unspecified: Secondary | ICD-10-CM | POA: Diagnosis not present

## 2021-03-08 DIAGNOSIS — A419 Sepsis, unspecified organism: Secondary | ICD-10-CM

## 2021-03-08 DIAGNOSIS — R7309 Other abnormal glucose: Secondary | ICD-10-CM | POA: Diagnosis not present

## 2021-03-08 DIAGNOSIS — Z7984 Long term (current) use of oral hypoglycemic drugs: Secondary | ICD-10-CM

## 2021-03-08 DIAGNOSIS — I5032 Chronic diastolic (congestive) heart failure: Secondary | ICD-10-CM | POA: Diagnosis present

## 2021-03-08 DIAGNOSIS — Z6825 Body mass index (BMI) 25.0-25.9, adult: Secondary | ICD-10-CM | POA: Diagnosis not present

## 2021-03-08 DIAGNOSIS — Z23 Encounter for immunization: Secondary | ICD-10-CM | POA: Diagnosis present

## 2021-03-08 DIAGNOSIS — E119 Type 2 diabetes mellitus without complications: Secondary | ICD-10-CM | POA: Diagnosis not present

## 2021-03-08 DIAGNOSIS — I4811 Longstanding persistent atrial fibrillation: Secondary | ICD-10-CM | POA: Diagnosis not present

## 2021-03-08 DIAGNOSIS — D500 Iron deficiency anemia secondary to blood loss (chronic): Secondary | ICD-10-CM | POA: Diagnosis not present

## 2021-03-08 DIAGNOSIS — E1152 Type 2 diabetes mellitus with diabetic peripheral angiopathy with gangrene: Principal | ICD-10-CM | POA: Diagnosis present

## 2021-03-08 DIAGNOSIS — E1165 Type 2 diabetes mellitus with hyperglycemia: Secondary | ICD-10-CM | POA: Diagnosis present

## 2021-03-08 DIAGNOSIS — E11621 Type 2 diabetes mellitus with foot ulcer: Secondary | ICD-10-CM | POA: Diagnosis present

## 2021-03-08 DIAGNOSIS — I739 Peripheral vascular disease, unspecified: Secondary | ICD-10-CM

## 2021-03-08 DIAGNOSIS — I959 Hypotension, unspecified: Secondary | ICD-10-CM | POA: Diagnosis not present

## 2021-03-08 DIAGNOSIS — S91001A Unspecified open wound, right ankle, initial encounter: Secondary | ICD-10-CM

## 2021-03-08 DIAGNOSIS — L97519 Non-pressure chronic ulcer of other part of right foot with unspecified severity: Secondary | ICD-10-CM | POA: Diagnosis present

## 2021-03-08 DIAGNOSIS — I10 Essential (primary) hypertension: Secondary | ICD-10-CM | POA: Diagnosis not present

## 2021-03-08 DIAGNOSIS — I96 Gangrene, not elsewhere classified: Secondary | ICD-10-CM | POA: Diagnosis not present

## 2021-03-08 DIAGNOSIS — D638 Anemia in other chronic diseases classified elsewhere: Secondary | ICD-10-CM | POA: Diagnosis not present

## 2021-03-08 DIAGNOSIS — E871 Hypo-osmolality and hyponatremia: Secondary | ICD-10-CM | POA: Diagnosis present

## 2021-03-08 DIAGNOSIS — E860 Dehydration: Secondary | ICD-10-CM | POA: Diagnosis present

## 2021-03-08 DIAGNOSIS — M009 Pyogenic arthritis, unspecified: Secondary | ICD-10-CM | POA: Diagnosis present

## 2021-03-08 DIAGNOSIS — E440 Moderate protein-calorie malnutrition: Secondary | ICD-10-CM | POA: Diagnosis present

## 2021-03-08 DIAGNOSIS — I482 Chronic atrial fibrillation, unspecified: Secondary | ICD-10-CM | POA: Diagnosis present

## 2021-03-08 DIAGNOSIS — D62 Acute posthemorrhagic anemia: Secondary | ICD-10-CM | POA: Diagnosis not present

## 2021-03-08 DIAGNOSIS — Z9114 Patient's other noncompliance with medication regimen: Secondary | ICD-10-CM | POA: Diagnosis not present

## 2021-03-08 DIAGNOSIS — M86171 Other acute osteomyelitis, right ankle and foot: Secondary | ICD-10-CM | POA: Diagnosis not present

## 2021-03-08 DIAGNOSIS — M7989 Other specified soft tissue disorders: Secondary | ICD-10-CM

## 2021-03-08 DIAGNOSIS — I509 Heart failure, unspecified: Secondary | ICD-10-CM

## 2021-03-08 DIAGNOSIS — D72828 Other elevated white blood cell count: Secondary | ICD-10-CM | POA: Diagnosis not present

## 2021-03-08 DIAGNOSIS — Z7982 Long term (current) use of aspirin: Secondary | ICD-10-CM

## 2021-03-08 DIAGNOSIS — Z7901 Long term (current) use of anticoagulants: Secondary | ICD-10-CM

## 2021-03-08 DIAGNOSIS — E1169 Type 2 diabetes mellitus with other specified complication: Secondary | ICD-10-CM | POA: Diagnosis present

## 2021-03-08 DIAGNOSIS — E785 Hyperlipidemia, unspecified: Secondary | ICD-10-CM

## 2021-03-08 DIAGNOSIS — Z20822 Contact with and (suspected) exposure to covid-19: Secondary | ICD-10-CM | POA: Diagnosis present

## 2021-03-08 DIAGNOSIS — Z8679 Personal history of other diseases of the circulatory system: Secondary | ICD-10-CM | POA: Diagnosis not present

## 2021-03-08 DIAGNOSIS — E782 Mixed hyperlipidemia: Secondary | ICD-10-CM | POA: Diagnosis present

## 2021-03-08 DIAGNOSIS — L089 Local infection of the skin and subcutaneous tissue, unspecified: Secondary | ICD-10-CM | POA: Diagnosis present

## 2021-03-08 DIAGNOSIS — M86671 Other chronic osteomyelitis, right ankle and foot: Secondary | ICD-10-CM | POA: Diagnosis not present

## 2021-03-08 DIAGNOSIS — I4891 Unspecified atrial fibrillation: Secondary | ICD-10-CM | POA: Diagnosis present

## 2021-03-08 DIAGNOSIS — F17220 Nicotine dependence, chewing tobacco, uncomplicated: Secondary | ICD-10-CM | POA: Diagnosis present

## 2021-03-08 DIAGNOSIS — D509 Iron deficiency anemia, unspecified: Secondary | ICD-10-CM | POA: Diagnosis present

## 2021-03-08 DIAGNOSIS — Z79899 Other long term (current) drug therapy: Secondary | ICD-10-CM | POA: Diagnosis not present

## 2021-03-08 DIAGNOSIS — E46 Unspecified protein-calorie malnutrition: Secondary | ICD-10-CM

## 2021-03-08 LAB — CBC WITH DIFFERENTIAL/PLATELET
Abs Immature Granulocytes: 0.1 10*3/uL — ABNORMAL HIGH (ref 0.00–0.07)
Basophils Absolute: 0 10*3/uL (ref 0.0–0.1)
Basophils Relative: 0 %
Eosinophils Absolute: 0 10*3/uL (ref 0.0–0.5)
Eosinophils Relative: 0 %
HCT: 27.5 % — ABNORMAL LOW (ref 39.0–52.0)
Hemoglobin: 9.4 g/dL — ABNORMAL LOW (ref 13.0–17.0)
Immature Granulocytes: 1 %
Lymphocytes Relative: 8 %
Lymphs Abs: 1.5 10*3/uL (ref 0.7–4.0)
MCH: 27.7 pg (ref 26.0–34.0)
MCHC: 34.2 g/dL (ref 30.0–36.0)
MCV: 81.1 fL (ref 80.0–100.0)
Monocytes Absolute: 2.2 10*3/uL — ABNORMAL HIGH (ref 0.1–1.0)
Monocytes Relative: 11 %
Neutro Abs: 16.4 10*3/uL — ABNORMAL HIGH (ref 1.7–7.7)
Neutrophils Relative %: 80 %
Platelets: 311 10*3/uL (ref 150–400)
RBC: 3.39 MIL/uL — ABNORMAL LOW (ref 4.22–5.81)
RDW: 14.4 % (ref 11.5–15.5)
WBC: 20.3 10*3/uL — ABNORMAL HIGH (ref 4.0–10.5)
nRBC: 0 % (ref 0.0–0.2)

## 2021-03-08 LAB — COMPREHENSIVE METABOLIC PANEL
ALT: 19 U/L (ref 0–44)
AST: 34 U/L (ref 15–41)
Albumin: 2.2 g/dL — ABNORMAL LOW (ref 3.5–5.0)
Alkaline Phosphatase: 110 U/L (ref 38–126)
Anion gap: 9 (ref 5–15)
BUN: 21 mg/dL (ref 8–23)
CO2: 27 mmol/L (ref 22–32)
Calcium: 8.7 mg/dL — ABNORMAL LOW (ref 8.9–10.3)
Chloride: 98 mmol/L (ref 98–111)
Creatinine, Ser: 1.32 mg/dL — ABNORMAL HIGH (ref 0.61–1.24)
GFR, Estimated: 55 mL/min — ABNORMAL LOW (ref >60)
Glucose, Bld: 172 mg/dL — ABNORMAL HIGH (ref 70–99)
Potassium: 4 mmol/L (ref 3.5–5.1)
Sodium: 134 mmol/L — ABNORMAL LOW (ref 135–145)
Total Bilirubin: 0.6 mg/dL (ref 0.3–1.2)
Total Protein: 7.6 g/dL (ref 6.5–8.1)

## 2021-03-08 LAB — SEDIMENTATION RATE: Sed Rate: 126 mm/hr — ABNORMAL HIGH (ref 0–20)

## 2021-03-08 LAB — PROTIME-INR
INR: 1.4 — ABNORMAL HIGH (ref 0.8–1.2)
Prothrombin Time: 16.7 seconds — ABNORMAL HIGH (ref 11.4–15.2)

## 2021-03-08 LAB — C-REACTIVE PROTEIN: CRP: 25 mg/dL — ABNORMAL HIGH (ref <1.0)

## 2021-03-08 LAB — CBG MONITORING, ED
Glucose-Capillary: 100 mg/dL — ABNORMAL HIGH (ref 70–99)
Glucose-Capillary: 104 mg/dL — ABNORMAL HIGH (ref 70–99)
Glucose-Capillary: 95 mg/dL (ref 70–99)

## 2021-03-08 LAB — HEMOGLOBIN A1C
Hgb A1c MFr Bld: 4.9 % (ref 4.8–5.6)
Mean Plasma Glucose: 93.93 mg/dL

## 2021-03-08 LAB — LACTIC ACID, PLASMA
Lactic Acid, Venous: 2.6 mmol/L (ref 0.5–1.9)
Lactic Acid, Venous: 2.1 mmol/L (ref 0.5–1.9)

## 2021-03-08 LAB — RESP PANEL BY RT-PCR (FLU A&B, COVID) ARPGX2
Influenza A by PCR: NEGATIVE
Influenza B by PCR: NEGATIVE
SARS Coronavirus 2 by RT PCR: NEGATIVE

## 2021-03-08 LAB — GLUCOSE, CAPILLARY
Glucose-Capillary: 128 mg/dL — ABNORMAL HIGH (ref 70–99)
Glucose-Capillary: 152 mg/dL — ABNORMAL HIGH (ref 70–99)

## 2021-03-08 LAB — APTT: aPTT: 51 seconds — ABNORMAL HIGH (ref 24–36)

## 2021-03-08 MED ORDER — INSULIN ASPART 100 UNIT/ML IJ SOLN
0.0000 [IU] | INTRAMUSCULAR | Status: DC
  Filled 2021-03-08: qty 1

## 2021-03-08 MED ORDER — LACTATED RINGERS IV BOLUS
1000.0000 mL | Freq: Once | INTRAVENOUS | Status: AC
  Administered 2021-03-08: 1000 mL via INTRAVENOUS

## 2021-03-08 MED ORDER — LACTATED RINGERS IV SOLN: INTRAVENOUS | Status: DC

## 2021-03-08 MED ORDER — MORPHINE SULFATE (PF) 4 MG/ML IV SOLN
4.0000 mg | Freq: Once | INTRAVENOUS | Status: AC
  Administered 2021-03-08: 4 mg via INTRAVENOUS
  Filled 2021-03-08: qty 1

## 2021-03-08 MED ORDER — SODIUM CHLORIDE 0.9 % IV SOLN
2.0000 g | Freq: Once | INTRAVENOUS | Status: AC
  Administered 2021-03-08: 2 g via INTRAVENOUS
  Filled 2021-03-08: qty 2

## 2021-03-08 MED ORDER — VANCOMYCIN HCL IN DEXTROSE 1-5 GM/200ML-% IV SOLN
1000.0000 mg | Freq: Once | INTRAVENOUS | Status: AC
Start: 2021-03-08 — End: 2021-03-08
  Administered 2021-03-08: 1000 mg via INTRAVENOUS
  Filled 2021-03-08: qty 200

## 2021-03-08 MED ORDER — GLUCERNA SHAKE PO LIQD
237.0000 mL | Freq: Three times a day (TID) | ORAL | Status: DC
  Administered 2021-03-08 – 2021-03-10 (×5): 237 mL via ORAL

## 2021-03-08 MED ORDER — MORPHINE SULFATE (PF) 2 MG/ML IV SOLN
2.0000 mg | INTRAVENOUS | Status: DC | PRN
  Administered 2021-03-08 – 2021-03-15 (×10): 2 mg via INTRAVENOUS
  Filled 2021-03-08 (×9): qty 1

## 2021-03-08 MED ORDER — MORPHINE SULFATE (PF) 2 MG/ML IV SOLN
2.0000 mg | INTRAVENOUS | Status: DC | PRN
  Filled 2021-03-08: qty 1

## 2021-03-08 MED ORDER — SODIUM CHLORIDE 0.9 % IV SOLN: INTRAVENOUS | Status: DC

## 2021-03-08 MED ORDER — TETANUS-DIPHTH-ACELL PERTUSSIS 5-2.5-18.5 LF-MCG/0.5 IM SUSY
0.5000 mL | PREFILLED_SYRINGE | Freq: Once | INTRAMUSCULAR | Status: AC
  Administered 2021-03-08: 0.5 mL via INTRAMUSCULAR
  Filled 2021-03-08: qty 0.5

## 2021-03-08 MED ORDER — VANCOMYCIN HCL 1250 MG/250ML IV SOLN
1250.0000 mg | INTRAVENOUS | Status: DC
  Administered 2021-03-09: 1250 mg via INTRAVENOUS
  Filled 2021-03-08: qty 250

## 2021-03-08 MED ORDER — SODIUM CHLORIDE 0.9 % IV SOLN
2.0000 g | Freq: Two times a day (BID) | INTRAVENOUS | Status: AC
  Administered 2021-03-09 – 2021-03-13 (×10): 2 g via INTRAVENOUS
  Filled 2021-03-08 (×11): qty 2

## 2021-03-08 NOTE — Sepsis Progress Note (Signed)
ELink tracking the Code Sepsis. 

## 2021-03-08 NOTE — Progress Notes (Signed)
HERMEN, MARIO ([id number]) Visit Report for 03/08/2021 Arrival Information Details Patient Name: [name] [name], [name] [name] Date of Service: 03/08/2021 8:30 AM Medical Record Number: [id number] Patient Account Number: [id number] Date of Birth/Sex: 10/25/1940 (80 y.o. M) Treating RN: Primary Care Wandy Bossler: Evangeline Gula, Carollee Herter Other Clinician: Izetta Dakin Referring Braidyn Peace: Evangeline Gula, Carollee Herter Treating Kayd Launer/Extender: Rowan Blase in Treatment: 9 Visit Information History Since Last Visit Added or deleted any medications: No Patient Arrived: Wheel Chair Any new allergies or adverse reactions: No Arrival Time: 09:15 Had a fall or experienced change in No Accompanied By: wife activities of daily living that may affect Transfer Assistance: Manual risk of falls: Patient Identification Verified: Yes Signs or symptoms of abuse/neglect since last visito No Secondary Verification Process Completed: Yes Hospitalized since last visit: No Patient Requires Transmission-Based No Implantable device outside of the clinic excluding No Precautions: cellular tissue based products placed in the center Patient Has Alerts: Yes since last visit: Patient Alerts: Patient on Blood Has Dressing in Place as Prescribed: Yes Thinner Pain Present Now: Yes XARELTO Diabetic ABI left .87 TBI 0.26 ABI right .62 TBI 0.23 Electronic Signature(s) Signed: 03/08/2021 12:06:25 PM By: Aleda Grana RCP, RRT, CHT Entered By: Aleda Grana on 03/08/2021 12:01:59 [name] [name] ([id number]) -------------------------------------------------------------------------------- Encounter Discharge Information Details Patient Name: [name] [name] Date of Service: 03/08/2021 8:30 AM Medical Record Number: [id number] Patient Account Number: [id number] Date of Birth/Sex: 11-17-1940 (80 y.o. M) Treating RN: Primary Care Jacob Chamblee: Evangeline Gula, Carollee Herter Other Clinician: Izetta Dakin Referring Vlasta Baskin:  Evangeline Gula, Carollee Herter Treating Mayleigh Tetrault/Extender: Rowan Blase in Treatment: 9 Encounter Discharge Information Items Discharge Condition: Stable Ambulatory Status: Wheelchair Discharge Destination: Hospital Telephoned: Yes Spoke With: ED Orders Sent: Yes Transportation: Private Auto Accompanied By: wife Schedule Follow-up Appointment: Yes Clinical Summary of Care: Notes Patient has an HBO treatment scheduled on 03/09/21 at 08:00 am. Electronic Signature(s) Signed: 03/08/2021 12:06:25 PM By: Aleda Grana RCP, RRT, CHT Entered By: Aleda Grana on 03/08/2021 12:04:58 [name] [name] ([id number]) -------------------------------------------------------------------------------- Vitals Details Patient Name: [name] [name] Date of Service: 03/08/2021 8:30 AM Medical Record Number: [id number] Patient Account Number: [id number] Date of Birth/Sex: Feb 16, 1941 (80 y.o. M) Treating RN: Primary Care Kaliyah Gladman: Evangeline Gula, Carollee Herter Other Clinician: Izetta Dakin Referring Derya Dettmann: Evangeline Gula, Carollee Herter Treating Kejuan Bekker/Extender: Allen Derry Weeks in Treatment: 9 Vital Signs Time Taken: 08:30 Temperature (F): 97.9 Height (in): 72 Pulse (bpm): 78 Weight (lbs): 178 Respiratory Rate (breaths/min): 18 Body Mass Index (BMI): 24.1 Blood Pressure (mmHg): 104/60 Capillary Blood Glucose (mg/dl): 924 Reference Range: 80 - 120 mg / dl Electronic Signature(s) Signed: 03/08/2021 12:06:25 PM By: Aleda Grana RCP, RRT, CHT Entered By: Aleda Grana on 03/08/2021 12:02:07

## 2021-03-08 NOTE — Progress Notes (Signed)
PHARMACY -  BRIEF ANTIBIOTIC NOTE   Pharmacy has received consult(s) for vancomycin and cefepime from an ED provider.  The patient's profile has been reviewed for ht/wt/allergies/indication/available labs.    One time order(s) placed for: Cefepime 2 g Vancomycin 2 g (1 g followed by 1 g)  Further antibiotics/pharmacy consults should be ordered by admitting physician if indicated.                       Thank you, Robyne Peers Aggie Douse 03/08/2021  1:32 PM

## 2021-03-08 NOTE — Progress Notes (Signed)
CODE SEPSIS - PHARMACY COMMUNICATION  **Broad Spectrum Antibiotics should be administered within 1 hour of Sepsis diagnosis**  Time Code Sepsis Called/Page Received: 1333  Antibiotics Ordered: vancomycin and cefepime  Time of 1st antibiotic administration: 1342   Raiford Noble ,PharmD Clinical Pharmacist  03/08/2021  1:33 PM

## 2021-03-08 NOTE — Progress Notes (Signed)
   03/08/21 1415  Clinical Encounter Type  Visited With Patient and family together  Visit Type Initial;Spiritual support;Social support  Referral From Other (Comment) (rounding)  Spiritual Encounters  Spiritual Needs Other (Comment) (hospitality, support)  Chaplain Burris checked-in with Pt and spouse. Chaplain offered hospitality and support; asked spouse if she was eating while she waits. Pt was in pain but tolerating. Chaplain encouraged spouse in her self-care and also offered that she would lift them in prayer as they prepare for Pt's procedure.

## 2021-03-08 NOTE — Consult Note (Addendum)
[facility] Vascular Consult Note  MRN : [id number]  [name] [name] is a 80 y.o. ([birth year]) male who presents with chief complaint of  Chief Complaint  Patient presents with   Osteomyelitis    History of Present Illness:  Patient is a 80 year old has been having problems with the lateral right ankle after a bug bite on the ankle several months ago it has not healed in fact he had angiography a month ago by Dr. Wyn Quaker that showed significant peripheral vascular disease.  He was in wound care today and hyperbaric treatment and was having increasing pain.  He has a very large wound with exposed bone on the lateral aspect ankle and a large blister to the medial ankle that is probably related to severe deep infection.  Vascular surgery was consulted by Dr. Rosita Kea for possible amputation in the setting of sepsis/gangrene.  Current Facility-Administered Medications  Medication Dose Route Frequency Provider Last Rate Last Admin   insulin aspart (novoLOG) injection 0-15 Units  0-15 Units Subcutaneous Q4H Gilles Chiquito, MD       lactated ringers bolus 1,000 mL  1,000 mL Intravenous Once Gilles Chiquito, MD       lactated ringers infusion   Intravenous Continuous Gilles Chiquito, MD       Tdap Leda Min) injection 0.5 mL  0.5 mL Intramuscular Once Gilles Chiquito, MD       vancomycin (VANCOCIN) IVPB 1000 mg/200 mL premix  1,000 mg Intravenous Once Rauer, Robyne Peers, RPH 200 mL/hr at 03/08/21 1502 1,000 mg at 03/08/21 1502   Followed by   vancomycin (VANCOCIN) IVPB 1000 mg/200 mL premix  1,000 mg Intravenous Once Rauer, Robyne Peers, RPH       Current Outpatient Medications  Medication Sig Dispense Refill   acetaminophen (TYLENOL) 500 MG tablet Take 500-1,000 mg by mouth every 6 (six) hours as needed for mild pain or moderate pain.     atorvastatin (LIPITOR) 10 MG tablet Take 1 tablet (10 mg total) by mouth daily. 30 tablet 11   carvedilol (COREG) 6.25 MG tablet Take 6.25 mg  2 (two) times daily by mouth.     digoxin (LANOXIN) 0.125 MG tablet Take 0.125 mcg daily by mouth.     furosemide (LASIX) 20 MG tablet Take 20 mg daily by mouth.     ibuprofen (ADVIL) 200 MG tablet Take 400-800 mg by mouth every 6 (six) hours as needed for mild pain or moderate pain.     levofloxacin (LEVAQUIN) 500 MG tablet Take 500 mg by mouth daily.     lisinopril (PRINIVIL,ZESTRIL) 10 MG tablet Take 15 mg by mouth daily.     metFORMIN (GLUCOPHAGE) 1000 MG tablet Take 1,000 mg by mouth 2 (two) times daily with a meal.     rivaroxaban (XARELTO) 20 MG TABS tablet Take 20 mg daily by mouth.     vitamin B-12 (CYANOCOBALAMIN) 1000 MCG tablet Take 1,000 mcg by mouth daily.     aspirin EC 81 MG tablet Take 1 tablet (81 mg total) by mouth daily. (Patient not taking: Reported on 03/08/2021) 30 tablet 11   Past Medical History:  Diagnosis Date   CHF (congestive heart failure) (HCC)    Diabetes (HCC)    Dysrhythmia    AFIB   HTN (hypertension)    Infection    LEG   Pancreatitis    Past Surgical History:  Procedure Laterality Date   CATARACT EXTRACTION W/PHACO Right 03/08/2017   Procedure: CATARACT EXTRACTION  PHACO AND INTRAOCULAR LENS PLACEMENT (IOC);  Surgeon: Nevada Crane, MD;  Location: [facility] ORS;  Service: Ophthalmology;  Laterality: Right;  Lot# [id number] [place]: 00:30.9 AP%: 8.2 CDE: 2.54   CATARACT EXTRACTION W/PHACO Left 04/04/2017   Procedure: CATARACT EXTRACTION PHACO AND INTRAOCULAR LENS PLACEMENT (IOC);  Surgeon: Nevada Crane, MD;  Location: [facility] ORS;  Service: Ophthalmology;  Laterality: Left;  [place] 00:31.4 AP% 12.0 CDE 3.78 Fluid Pack lot # [id number]   CHOLECYSTECTOMY     EYE SURGERY     right eye   LOWER EXTREMITY ANGIOGRAPHY Right 02/10/2021   Procedure: LOWER EXTREMITY ANGIOGRAPHY;  Surgeon: Annice Needy, MD;  Location: [facility];  Service: Cardiovascular;  Laterality: Right;   Social History Social History   Tobacco Use   Smoking status: Never    Smokeless tobacco: Current    Types: Chew  Substance Use Topics   Alcohol use: No    Alcohol/week: 0.0 standard drinks   Drug use: No   Family History No family history on file. Denies family history of peripheral artery disease, venous disease or renal disease.  No Known Allergies  REVIEW OF SYSTEMS (Negative unless checked)  Constitutional: [] Weight loss  [] Fever  [] Chills Cardiac: [] Chest pain   [] Chest pressure   [] Palpitations   [] Shortness of breath when laying flat   [] Shortness of breath at rest   [] Shortness of breath with exertion. Vascular:  [x] Pain in legs with walking   [x] Pain in legs at rest   [x] Pain in legs when laying flat   [] Claudication   [x] Pain in feet when walking  [x] Pain in feet at rest  [x] Pain in feet when laying flat   [] History of DVT   [] Phlebitis   [] Swelling in legs   [] Varicose veins   [x] Non-healing ulcers Pulmonary:   [] Uses home oxygen   [] Productive cough   [] Hemoptysis   [] Wheeze  [] COPD   [] Asthma Neurologic:  [] Dizziness  [] Blackouts   [] Seizures   [] History of stroke   [] History of TIA  [] Aphasia   [] Temporary blindness   [] Dysphagia   [] Weakness or numbness in arms   [] Weakness or numbness in legs Musculoskeletal:  [] Arthritis   [] Joint swelling   [] Joint pain   [] Low back pain Hematologic:  [] Easy bruising  [] Easy bleeding   [] Hypercoagulable state   [] Anemic  [] Hepatitis Gastrointestinal:  [] Blood in stool   [] Vomiting blood  [] Gastroesophageal reflux/heartburn   [] Difficulty swallowing. Genitourinary:  [x] Chronic kidney disease   [] Difficult urination  [] Frequent urination  [] Burning with urination   [] Blood in urine Skin:  [] Rashes   [] Ulcers   [] Wounds Psychological:  [] History of anxiety   []  History of major depression.  Physical Examination  Vitals:   03/08/21 1220 03/08/21 1221 03/08/21 1322  BP: 109/79  104/60  Pulse: 76    Resp: 18    Temp: 97.6 F (36.4 C)    TempSrc: Oral    SpO2: 97%    Weight:  86.2 kg   Height:  6'  (1.829 m)    Body mass index is 25.77 kg/m. Gen:  WD/WN, NAD Head: [place]/AT, No temporalis wasting. Prominent temp pulse not noted. Ear/Nose/Throat: Hearing grossly intact, nares w/o erythema or drainage, oropharynx w/o Erythema/Exudate Eyes: Sclera non-icteric, conjunctiva clear Neck: Trachea midline.  No JVD.  Pulmonary:  Good air movement, respirations not labored, equal bilaterally.  Cardiac: RRR, normal S1, S2. Vascular:  Vessel Right Left  Radial Palpable Palpable  Ulnar Palpable Palpable  Brachial Palpable Palpable  Carotid Palpable, without bruit Palpable, without bruit  Aorta Not palpable N/A  Femoral Palpable Palpable  Popliteal Palpable Palpable  PT Non-Palpable Non-Palpable  DP Non-Palpable Non-Palpable   Right lower extremity: Thigh soft.  Calf soft.  Open wound noted to the lateral ankle.  Foul smell.  Gastrointestinal: soft, non-tender/non-distended. No guarding/reflex.  Musculoskeletal: M/S 5/5 throughout.  Extremities without ischemic changes.  No deformity or atrophy. No edema. Neurologic: Sensation grossly intact in extremities.  Symmetrical.  Speech is fluent. Motor exam as listed above. Psychiatric: Judgment intact, Mood & affect appropriate for pt's clinical situation. Dermatologic: As above  Lymph : No Cervical, Axillary, or Inguinal lymphadenopathy.  CBC Lab Results  Component Value Date   WBC 20.3 (H) 03/08/2021   HGB 9.4 (L) 03/08/2021   HCT 27.5 (L) 03/08/2021   MCV 81.1 03/08/2021   PLT 311 03/08/2021   BMET    Component Value Date/Time   NA 134 (L) 03/08/2021 1235   NA 143 02/24/2012 0325   K 4.0 03/08/2021 1235   K 4.1 02/24/2012 0325   CL 98 03/08/2021 1235   CL 108 (H) 02/24/2012 0325   CO2 27 03/08/2021 1235   CO2 26 02/24/2012 0325   GLUCOSE 172 (H) 03/08/2021 1235   GLUCOSE 120 (H) 02/24/2012 0325   BUN 21 03/08/2021 1235   BUN 25 (H) 02/24/2012 0325   CREATININE 1.32 (H) 03/08/2021 1235   CREATININE 1.47 (H) 02/24/2012 0325    CALCIUM 8.7 (L) 03/08/2021 1235   CALCIUM 9.1 02/24/2012 0325   GFRNONAA 55 (L) 03/08/2021 1235   GFRNONAA 48 (L) 02/24/2012 0325   GFRAA 55 (L) 02/24/2012 0325   Estimated Creatinine Clearance: 49.8 mL/min (A) (by C-G formula based on SCr of 1.32 mg/dL (H)).  COAG Lab Results  Component Value Date   INR 1.4 (H) 03/08/2021   Radiology DG Chest 2 View  Result Date: 03/08/2021 CLINICAL DATA:  Suspected sepsis EXAM: CHEST - 2 VIEW COMPARISON:  02/22/2021. FINDINGS: Cardiac and mediastinal contours are within normal limits. Aortic atherosclerosis. No focal pulmonary opacity. No pleural effusion or pneumothorax. No acute osseous abnormality. IMPRESSION: No acute cardiopulmonary process. Aortic Atherosclerosis (ICD10-I70.0). Electronically Signed   By: Wiliam Ke M.D.   On: 03/08/2021 13:13   DG Chest 2 View  Result Date: 02/22/2021 CLINICAL DATA:  Preop for hyperbaric oxygen treatment. Nonhealing right lower extremity wound. EXAM: CHEST - 2 VIEW COMPARISON:  02/22/2012 FINDINGS: The heart size and mediastinal contours are within normal limits. Both lungs are clear. The visualized skeletal structures are unremarkable. IMPRESSION: No active cardiopulmonary disease. Electronically Signed   By: Danae Orleans M.D.   On: 02/22/2021 14:52   DG Ankle Complete Right  Result Date: 03/08/2021 CLINICAL DATA:  Suspected Sepsis EXAM: RIGHT ANKLE - COMPLETE 3+ VIEW COMPARISON:  February 08, 2021. FINDINGS: Large lateral ulcer at the ankle with erosive change in the subjacent distal fibula, compatible with osteomyelitis. Intra-articular gas at the anterior tibiotalar joint. Extensive soft tissue swelling at the ankle with gas medially. No evidence of acute fracture. No joint malalignment. IMPRESSION: 1. Large lateral ulcer at the ankle with extensive erosive change and osteopenia in the subjacent distal fibula and the lateral talus, compatible with osteomyelitis. Intra-articular gas at the anterior tibiotalar  joint is concerning for direct communication/septic arthritis. 2. Extensive soft tissue swelling at the ankle with gas medially, concerning for cellulitis with gas-forming organism. Electronically Signed   By: Feliberto Harts M.D.   On: 03/08/2021 13:18   MR  ANKLE RIGHT W WO CONTRAST  Result Date: 02/09/2021 CLINICAL DATA:  Lateral wound along the ankle, prior spider bite. EXAM: MRI OF THE RIGHT ANKLE WITHOUT AND WITH CONTRAST TECHNIQUE: Multiplanar, multisequence MR imaging of the ankle was performed before and after the administration of intravenous contrast. CONTRAST:  46mL GADAVIST GADOBUTROL 1 MMOL/ML IV SOLN COMPARISON:  Foot radiographs 01/25/2021 FINDINGS: TENDONS Peroneal: Mild common peroneus tendon sheath tenosynovitis. The tendon sheath is in close proximity to the large ulceration overlying the lateral malleolus. Posteromedial: Mild distal tibialis posterior tendinopathy. Anterior: Unremarkable Achilles: Unremarkable Plantar Fascia: Mildly thickened medial band of the plantar fascia with some low-grade edema along its upper margin suggesting minimal plantar fasciitis. LIGAMENTS Lateral: Attenuated anterior talofibular ligament suggesting a remote tear. Medial: Mildly thickened superomedial portion of the spring ligament. The deltoid ligament appears intact. CARTILAGE Ankle Joint: Small tibiotalar joint effusion. No bony destructive findings along the joint. Subtalar Joints/Sinus Tarsi: Mild degenerative arthropathy. Bones: There is osteomyelitis of the distal fibula, with the large lateral soft tissue ulceration extending down to the periosteal surface and with substantial marrow edema and endosteal enhancement especially laterally in the distal fibula and tracking up into the distal fibular metaphysis, as shown on 25 series 6. Subtle endosteal edema along the lateral portion of the calcaneus is probably reactive. Focal marrow edema proximally in the cuboid on image 27 of series 4 is probably  secondary to arthropathy. Other: Substantial edema medially along the ankle and in the dorsum of the foot, with little if any measurable enhancement, accordingly nonspecific for cellulitis. Accentuated T2 signal in the musculature of the foot, most likely from neurogenic edema. IMPRESSION: IMPRESSION 1. Very large ulceration of the lateral ankle exposing the periosteal margin of the distal fibula, with abnormal edema and endosteal enhancement laterally in the distal fibula compatible with osteomyelitis. 2. Mild common peroneus tendon sheath tenosynovitis. The tendon sheath is in close proximity to the ulceration. 3. Mild plantar fasciitis involving the medial band. 4. Mild distal tibialis posterior tendinopathy, with mildly thickened adjacent superomedial portion of the spring ligament. Correlate clinically in assessing for tibialis posterior dysfunction. 5. Attenuated ATFL suggesting a remote tear. 6. Substantial subcutaneous edema in the dorsum of the foot and along the medial ankle, without definite enhancement, therefore nonspecific for cellulitis versus non infectious edema. Electronically Signed   By: Gaylyn Rong M.D.   On: 02/09/2021 16:51   PERIPHERAL VASCULAR CATHETERIZATION  Result Date: 02/10/2021 See surgical note for result.   Assessment/Plan Patient is a 80 year old has been having problems with the lateral right ankle after a bug bite on the ankle several months ago it has not healed in fact he had angiography a month ago by Dr. Wyn Quaker that showed significant peripheral vascular disease who presents to the [facility] emergency department and found to be septic  1.  Chronic/septic right ankle: Patient with known history of atherosclerotic disease to the right lower extremity status post endovascular intervention approximately 1 month ago however presents with nonhealing ulceration to the lateral ankle.  Patient has been under the care of the wound center however  presented to the [facility] emergency department septic.  Source most likely from chronic right wound.  In an attempt to clear the infection/sepsis would recommend guillotine amputation followed by few days to allow the limb to drain then possibly an above-the-knee amputation for completion.  [name] at the bedside during this consult.  I discussed a guillotine amputation.  Risks benefits and the  procedure itself.  Questions were answered.  Patient wishes to proceed.  Currently contact the OR to see if we can take him to the operating room tonight or early tomorrow.  Patient is being admitted for sepsis and will start IV antibiotics.  2.  Diabetes: On appropriate medication. Encouraged good control as its slows the progression of atherosclerotic disease   3.  Hyperlipidemia: On statin for medical management Encouraged good control as its slows the progression of atherosclerotic disease   Addendum: The operating room will be able to accommodate [place] tomorrow at 930. Preop the patient.  Discussed with Dr. Weldon Inches, PA-C 03/08/2021 3:27 PM  This note was created with Dragon medical transcription system.  Any error is purely unintentional.

## 2021-03-08 NOTE — ED Notes (Addendum)
Critical Result: Lactic 2.1  Adefeso, DO aware

## 2021-03-08 NOTE — ED Provider Notes (Signed)
Emergency Medicine Provider Triage Evaluation Note  Chauncey Sciulli , a 80 y.o. male  was evaluated in triage.  Pt complains of right ankle pain and increased swelling and redness. He has been going to the wound care clinic but was sent over today for treatment of osteomyelitis.  Review of Systems  Positive: Pain Negative: Fever  Physical Exam  BP 109/79 (BP Location: Left Arm)   Pulse 76   Temp 97.6 F (36.4 C) (Oral)   Resp 18   Ht 6' (1.829 m)   Wt 86.2 kg   SpO2 97%   BMI 25.77 kg/m  Gen:   Awake, no distress   Resp:  Normal effort  MSK:   Moves extremities without difficulty  Other:    Medical Decision Making  Medically screening exam initiated at 12:22 PM.  Appropriate orders placed.  Bassam Dresch was informed that the remainder of the evaluation will be completed by another provider, this initial triage assessment does not replace that evaluation, and the importance of remaining in the ED until their evaluation is complete.   Chinita Pester, FNP 03/08/21 1257    Gilles Chiquito, MD 03/08/21 1314

## 2021-03-08 NOTE — ED Notes (Signed)
Pt reports that he just got out of the hyperbaric chamber and was sent to the ER for eval due to pain and increased swelling, pt states that he had a spider bite in 2006 and had to have surg on the same area, pt states that he has been battling this wound for the past 2-3 mos.

## 2021-03-08 NOTE — H&P (Signed)
History and Physical  [name] [name] [id number] DOB: 1940/10/19 DOA: 03/08/2021  Referring physician: Lucrezia Starch, MD PCP: Toni Arthurs, NP  Patient coming from: Home  Chief Complaint: Right ankle wound with pain  HPI: [name] [name] is a 80 y.o. male with medical history significant for essential hypertension, type 2 diabetes mellitus, atrial fibrillation on Xarelto, CHF, mixed hyperlipidemia who presents to the emergency department accompanied by wife due to several day onset of worsening pain from chronic wound of right ankle.  Patient has been following with wound care center for 2 to 3 months, hyperbaric treatment was recently started within last 2 weeks (per wife at bedside), patient went to the wound care center today due to several day onset of worsening pain from the wound and inflammation of right ankle.  After evaluation at the wound clinic, he was asked to go to the ED for further evaluation due to concern for acute osteomyelitis and possible septic joint.  Patient denies chest pain, shortness of breath, fever, chills, nausea, vomiting.  Patient was unsure of being on antibiotics.  He states that he stopped taking his Xarelto few days ago.  ED Course:  In the emergency department, he was hemodynamically stable, though BP was soft at 104/60.  Work-up in the ED showed leukocytosis with a left shift.  Normocytic anemia, hyponatremia, hypoalbuminemia, hyperglycemia, lactic acid 2.6 > 2.1.  Sed rate 126.  Influenza A, B, SARS coronavirus 2 was negative. Right ankle x-ray showed large lateral also, the ankle with suspicion for osteomyelitis.  Intra-articular gas at the anterior tibiotalar joint is concerning for direct complication/septic arthritis.  Extensive soft tissue swelling at the ankle with gas medially, concern for cellulitis with gas-forming organism. Chest x-ray showed no acute cardiopulmonary process. Patient was treated with IV cefepime and vancomycin.  IV hydration was  provided, morphine was given due to pain.  Hospitalist was asked to admit patient for further evaluation and management.  Review of Systems: Constitutional: Negative for chills and fever.  HENT: Negative for ear pain and sore throat.   Eyes: Negative for pain and visual disturbance.  Respiratory: Negative for cough, chest tightness and shortness of breath.   Cardiovascular: Negative for chest pain and palpitations.  Gastrointestinal: Negative for abdominal pain and vomiting.  Endocrine: Negative for polyphagia and polyuria.  Genitourinary: Negative for decreased urine volume, dysuria, enuresis Musculoskeletal: Positive for right ankle wound with worsening inflammation and pain.  Negative for back pain.  Skin: Positive for right ankle wound Allergic/Immunologic: Negative for immunocompromised state.  Neurological: Negative for tremors, syncope, speech difficulty, weakness, light-headedness and headaches.  Hematological: Does not bruise/bleed easily.  All other systems reviewed and are negative   Past Medical History:  Diagnosis Date   CHF (congestive heart failure) ([place])    Diabetes ([place])    Dysrhythmia    AFIB   HTN (hypertension)    Infection    LEG   Pancreatitis    Past Surgical History:  Procedure Laterality Date   CATARACT EXTRACTION W/PHACO Right 03/08/2017   Procedure: CATARACT EXTRACTION PHACO AND INTRAOCULAR LENS PLACEMENT ([place]);  Surgeon: Eulogio Bear, MD;  Location: [facility] ORS;  Service: Ophthalmology;  Laterality: Right;  Lot# [id number] H [place]: 00:30.9 AP%: 8.2 CDE: 2.54   CATARACT EXTRACTION W/PHACO Left 04/04/2017   Procedure: CATARACT EXTRACTION PHACO AND INTRAOCULAR LENS PLACEMENT ([facility]);  Surgeon: Eulogio Bear, MD;  Location: [facility] ORS;  Service: Ophthalmology;  Laterality: Left;  [place] 00:31.4 AP% 12.0 CDE 3.78 Fluid Pack lot # [id number]  CHOLECYSTECTOMY     EYE SURGERY     right eye   LOWER EXTREMITY ANGIOGRAPHY Right 02/10/2021   Procedure: LOWER  EXTREMITY ANGIOGRAPHY;  Surgeon: Annice Needy, MD;  Location: [facility] IN[facility]ASIVE [facility];  Service: Cardiovascular;  Laterality: Right;    Social History:  reports that he has never smoked. His smokeless tobacco use includes chew. He reports that he does not drink alcohol and does not use drugs.   No Known Allergies  Family History: Patient denies any Known family history.  Mother and father are deceased but patient does not know cause of death.   Prior to Admission medications   Medication Sig Start Date End Date Taking? Authorizing Provider  aspirin EC 81 MG tablet Take 1 tablet (81 mg total) by mouth daily. 02/10/21   Annice Needy, MD  atorvastatin (LIPITOR) 10 MG tablet Take 1 tablet (10 mg total) by mouth daily. 02/10/21 02/10/22  Annice Needy, MD  carvedilol (COREG) 6.25 MG tablet Take 6.25 mg 2 (two) times daily by mouth. 12/19/16   [provider]  digoxin (LANOXIN) 0.125 MG tablet Take 0.125 mcg daily by mouth. 12/19/16   [provider]  erythromycin ophthalmic ointment Place a 1/2 inch ribbon of ointment into the lower eyelid 5 times per day 03/27/15   Joselyn Arrow, NP  furosemide (LASIX) 20 MG tablet Take 20 mg daily by mouth. 12/19/16   [provider]  Hydrocortisone Probutate 0.1 % CREA Apply 1 application topically 3 (three) times daily as needed (for itching).  11/23/14   [provider]  lisinopril (PRINIVIL,ZESTRIL) 10 MG tablet Take 10 mg by mouth daily.    [provider]  metFORMIN (GLUCOPHAGE) 1000 MG tablet Take 500 mg by mouth 2 (two) times daily with a meal.     [provider]  XARELTO 20 MG TABS tablet Take 20 mg daily by mouth. 12/19/16   [provider]    Physical Exam: BP 131/73   Pulse (!) 101   Temp 97.6 F (36.4 C) (Oral)   Resp 18   Ht 6' (1.829 m)   Wt 86.2 kg   SpO2 100%   BMI 25.77 kg/m   General: 80 y.o. year-old male ill appearing but in no acute distress.  Alert and oriented  x3. HEENT: Dry mucous membrane.  NCAT, EOMI Neck: Supple, trachea medial Cardiovascular: Irregular rate and rhythm with no rubs or gallops.  No thyromegaly or JVD noted. 2/4 pulses in all 4 extremities. Respiratory: Clear to auscultation with no wheezes or rales. Good inspiratory effort. Abdomen: Soft, nontender nondistended with normal bowel sounds x4 quadrants. Muskuloskeletal: Noted foul-smelling drainage from an open right lateral ankle wound.  Blister noted on medial side of the right ankle. No cyanosis or clubbing noted bilaterally Neuro: CN II-XII intact, strength 5/5 x 4, sensation, reflexes intact Skin: No ulcerative lesions noted or rashes Psychiatry: Judgement and insight appear normal. Mood is appropriate for condition and setting          Labs on Admission:  Basic Metabolic Panel: Recent Labs  Lab 03/08/21 1235  NA 134*  K 4.0  CL 98  CO2 27  GLUCOSE 172*  BUN 21  CREATININE 1.32*  CALCIUM 8.7*   Liver Function Tests: Recent Labs  Lab 03/08/21 1235  AST 34  ALT 19  ALKPHOS 110  BILITOT 0.6  PROT 7.6  ALBUMIN 2.2*   No results for input(s): LIPASE, AMYLASE in the last 168 hours. No  results for input(s): AMMONIA in the last 168 hours. CBC: Recent Labs  Lab 03/08/21 1235  WBC 20.3*  NEUTROABS 16.4*  HGB 9.4*  HCT 27.5*  MCV 81.1  PLT 311   Cardiac Enzymes: No results for input(s): CKTOTAL, CKMB, CKMBINDEX, TROPONINI in the last 168 hours.  BNP (last 3 results) No results for input(s): BNP in the last 8760 hours.  ProBNP (last 3 results) No results for input(s): PROBNP in the last 8760 hours.  CBG: Recent Labs  Lab 03/07/21 1125 03/08/21 0830 03/08/21 0900 03/08/21 1509 03/08/21 1558  GLUCAP 152* 128* 152* 100* 104*    Radiological Exams on Admission: DG Chest 2 View  Result Date: 03/08/2021 CLINICAL DATA:  Suspected sepsis EXAM: CHEST - 2 VIEW COMPARISON:  02/22/2021. FINDINGS: Cardiac and mediastinal contours are within normal  limits. Aortic atherosclerosis. No focal pulmonary opacity. No pleural effusion or pneumothorax. No acute osseous abnormality. IMPRESSION: No acute cardiopulmonary process. Aortic Atherosclerosis (ICD10-I70.0). Electronically Signed   By: Merilyn Baba M.D.   On: 03/08/2021 13:13   DG Ankle Complete Right  Result Date: 03/08/2021 CLINICAL DATA:  Suspected Sepsis EXAM: RIGHT ANKLE - COMPLETE 3+ VIEW COMPARISON:  February 08, 2021. FINDINGS: Large lateral ulcer at the ankle with erosive change in the subjacent distal fibula, compatible with osteomyelitis. Intra-articular gas at the anterior tibiotalar joint. Extensive soft tissue swelling at the ankle with gas medially. No evidence of acute fracture. No joint malalignment. IMPRESSION: 1. Large lateral ulcer at the ankle with extensive erosive change and osteopenia in the subjacent distal fibula and the lateral talus, compatible with osteomyelitis. Intra-articular gas at the anterior tibiotalar joint is concerning for direct communication/septic arthritis. 2. Extensive soft tissue swelling at the ankle with gas medially, concerning for cellulitis with gas-forming organism. Electronically Signed   By: Margaretha Sheffield M.D.   On: 03/08/2021 13:18    EKG: I independently viewed the EKG done and my findings are as followed: A. fib with rate control  Assessment/Plan Present on Admission:  Acute osteomyelitis of right ankle (HCC)  Atrial fibrillation ([place])  Principal Problem:   Acute osteomyelitis of right ankle (HCC) Active Problems:   Atrial fibrillation (HCC)   CHF (congestive heart failure) (HCC)   Open wound of right ankle   Lactic acidosis   Hypoalbuminemia due to protein-calorie malnutrition (HCC)   Hyperglycemia due to diabetes mellitus (HCC)   Hyponatremia   Mixed hyperlipidemia   Peripheral arterial disease (HCC)  Acute osteomyelitis of right ankle Open wound of right ankle with suspicious septic arthritis Peripheral artery  disease Patient with history of peripheral arterial disease and status post 1 month of right lower extremity endovascular intervention (per vascular surgery medical record) Patient was being followed by wound care and was now being referred to the ED due to worsening chronic wound. Guillotine amputation planned for tomorrow morning; vascular surgery consult appreciated Patient will be placed n.p.o. at midnight Continue IV vancomycin and cefepime Continue IV morphine 2 mg every hour as needed as ordered per vascular surgery Consider PT/OT eval and treat after surgical procedure Patient may require SNF for rehab s/p surgery  Lactic acidosis in the setting of above Lactic acid 2.6 > 2.1, continue IV hydration Continue to trend lactic acid  Hyponatremia Na 134; dehydration  Hyperglycemia secondary to type 2 diabetes mellitus Continue ISS and hypoglycemic protocol Hold Novolog prior to surgical procedure  Hypoalbuminemia secondary to moderate protein calorie malnutrition Albumin 2.2, protein supplement will be provided  History of CHF Stable,  continue aspirin, Lipitor, Coreg (as permitted by BP) Lisinopril temporarily held due to soft BP  Essential hypertension Lisinopril will be held at this time due to soft BP Continue Coreg as permitted by BP  Chronic atrial fibrillation Continue Xarelto and Coreg  Mixed hyperlipidemia Continue Lipitor  DVT prophylaxis: SCDs  Code Status: Full code  Family Communication: Wife at bedside (all questions answered to satisfaction)  Disposition Plan:  Patient is from:                        home Anticipated DC to:                   SNF or family members home Anticipated DC date:               2-3 days Anticipated DC barriers:         Patient requires inpatient management due to acute osteomyelitis of right foot with suspicion for septic arthritis of the wound requiring IV antibiotics and pending surgical intervention  Consults called:  Vascular surgery  Admission status: Inpatient    Bernadette Hoit MD [facility]  03/08/2021, 6:39 PM

## 2021-03-08 NOTE — ED Provider Notes (Signed)
[facility] Emergency Department Provider Note  ____________________________________________   Event Date/Time   First MD Initiated Contact with Patient 03/08/21 1318     (approximate)  I have reviewed the triage vital signs and the nursing notes.   HISTORY  Chief Complaint Osteomyelitis    HPI [name] [name] is a 80 y.o. male history of congestive heart failure cardiomyopathy hypertension asthma hyperlipidemia GERD A. 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  Patient states it has been sore for several weeks but may be a little more sore last couple days.  He states that swelling is increased around the whole ankle and over the foot over the last couple days as well.  He has not had any recent injuries or falls that he recalls.  No fevers, headache, earache, sore throat, nausea, vomiting, diarrhea, burning with urination or any other joints that are bothering him at this time.  He states he thinks he is on antibiotics but is not sure which ones.  He states he stopped taking Xarelto couple days ago because he was taking daily medications.         Past Medical History:  Diagnosis Date   CHF (congestive heart failure) (HCC)    Diabetes (HCC)    Dysrhythmia    AFIB   HTN (hypertension)    Infection    LEG   Pancreatitis     Patient Active Problem List   Diagnosis Date Noted   CHF (congestive heart failure) (HCC) 02/02/2021   Diabetes mellitus type 2, uncomplicated (HCC) 02/02/2021   Right leg weakness 11/19/2019   Vitamin B12 deficiency 05/20/2018   Vitamin D deficiency 05/20/2018   Cardiomyopathy (HCC) 07/29/2013   Asthma 02/19/2012   Hypertension 02/19/2012   Atrial fibrillation (HCC) 10/27/2011   GERD (gastroesophageal reflux disease) 10/27/2011   Hypercholesteremia  10/27/2011    Past Surgical History:  Procedure Laterality Date   CATARACT EXTRACTION W/PHACO Right 03/08/2017   Procedure: CATARACT EXTRACTION PHACO AND INTRAOCULAR LENS PLACEMENT (IOC);  Surgeon: Nevada Crane, MD;  Location: [facility] ORS;  Service: Ophthalmology;  Laterality: Right;  Lot# [id number] [place]: 00:30.9 AP%: 8.2 CDE: 2.54   CATARACT EXTRACTION W/PHACO Left 04/04/2017   Procedure: CATARACT EXTRACTION PHACO AND INTRAOCULAR LENS PLACEMENT (IOC);  Surgeon: Nevada Crane, MD;  Location: [facility] ORS;  Service: Ophthalmology;  Laterality: Left;  [place] 00:31.4 AP% 12.0 CDE 3.78 Fluid Pack lot # [id number]   CHOLECYSTECTOMY     EYE SURGERY     right eye   LOWER EXTREMITY ANGIOGRAPHY Right 02/10/2021   Procedure: LOWER EXTREMITY ANGIOGRAPHY;  Surgeon: Annice Needy, MD;  Location: [facility] INVASIVE CV LAB;  Service: Cardiovascular;  Laterality: Right;    Prior to Admission medications   Medication Sig Start Date End Date Taking? Authorizing Provider  aspirin EC 81 MG tablet Take 1 tablet (81 mg total) by mouth daily. 02/10/21   Annice Needy, MD  atorvastatin (LIPITOR) 10 MG tablet Take 1 tablet (10 mg total) by mouth daily. 02/10/21 02/10/22  Annice Needy, MD  carvedilol (COREG) 6.25 MG tablet Take 6.25 mg 2 (two) times daily by mouth. 12/19/16   [provider]  digoxin (LANOXIN) 0.125 MG tablet Take 0.125 mcg daily by mouth. 12/19/16   [provider]  erythromycin ophthalmic ointment Place a  1/2 inch ribbon of ointment into the lower eyelid 5 times per day 03/27/15   Andria Meuse, NP  furosemide (LASIX) 20 MG tablet Take 20 mg daily by mouth. 12/19/16   [provider]  Hydrocortisone Probutate 0.1 % CREA Apply 1 application topically 3 (three) times daily as needed (for itching).  11/23/14   [provider]  lisinopril (PRINIVIL,ZESTRIL) 10 MG tablet Take 10 mg by mouth daily.    [provider]  metFORMIN (GLUCOPHAGE) 1000 MG tablet Take 500 mg  by mouth 2 (two) times daily with a meal.     [provider]  XARELTO 20 MG TABS tablet Take 20 mg daily by mouth. 12/19/16   [provider]    Allergies Patient has no known allergies.  No family history on file.  Social History Social History   Tobacco Use   Smoking status: Never   Smokeless tobacco: Current    Types: Chew  Substance Use Topics   Alcohol use: No    Alcohol/week: 0.0 standard drinks   Drug use: No    Review of Systems  Review of Systems  Constitutional:  Negative for chills and fever.  HENT:  Negative for sore throat.   Eyes:  Negative for pain.  Respiratory:  Negative for cough and stridor.   Cardiovascular:  Negative for chest pain.  Gastrointestinal:  Negative for vomiting.  Genitourinary:  Negative for dysuria.  Musculoskeletal:  Positive for joint pain (R ankle) and myalgias (R ankle). Negative for neck pain.  Skin:  Negative for rash.  Neurological:  Negative for seizures, loss of consciousness and headaches.  Psychiatric/Behavioral:  Negative for suicidal ideas.   All other systems reviewed and are negative.    ____________________________________________   PHYSICAL EXAM:  VITAL SIGNS: ED Triage Vitals  Enc Vitals Group     BP 03/08/21 1220 109/79     Pulse Rate 03/08/21 1220 76     Resp 03/08/21 1220 18     Temp 03/08/21 1220 97.6 F (36.4 C)     Temp Source 03/08/21 1220 Oral     SpO2 03/08/21 1220 97 %     Weight 03/08/21 1221 190 lb (86.2 kg)     Height 03/08/21 1221 6' (1.829 m)     Head Circumference --      Peak Flow --      Pain Score 03/08/21 1221 5     Pain Loc --      Pain Edu? --      Excl. in [place]? --    Vitals:   03/08/21 1220 03/08/21 1322  BP: 109/79 104/60  Pulse: 76   Resp: 18   Temp: 97.6 F (36.4 C)   SpO2: 97%    Physical Exam Vitals and nursing note reviewed.  Constitutional:      Appearance: He is well-developed. He is ill-appearing.  HENT:     Head: Normocephalic and  atraumatic.     Right Ear: External ear normal.     Left Ear: External ear normal.     Nose: Nose normal.     Mouth/Throat:     Mouth: Mucous membranes are dry.  Eyes:     Conjunctiva/sclera: Conjunctivae normal.  Cardiovascular:     Rate and Rhythm: Normal rate and regular rhythm.     Heart sounds: No murmur heard. Pulmonary:     Effort: Pulmonary effort is normal. No respiratory distress.     Breath sounds: Normal breath sounds.  Abdominal:  Palpations: Abdomen is soft.     Tenderness: There is no abdominal tenderness.  Musculoskeletal:     Cervical back: Neck supple.     Right lower leg: Edema present.  Skin:    General: Skin is warm and dry.     Capillary Refill: Capillary refill takes more than 3 seconds.  Neurological:     Mental Status: He is alert and oriented to person, place, and time.  Psychiatric:        Mood and Affect: Mood normal.    Patient has a very large ulcerative lesion does not probe down to bone over the right malleolus.  There is some purulent drainage.  Remainder the foot ankle and distal lower leg is edematous and tender.  Patient is to move his toes on command. ____________________________________________   LABS (all labs ordered are listed, but only abnormal results are displayed)  Labs Reviewed  COMPREHENSIVE METABOLIC PANEL - Abnormal; Notable for the following components:      Result Value   Sodium 134 (*)    Glucose, Bld 172 (*)    Creatinine, Ser 1.32 (*)    Calcium 8.7 (*)    Albumin 2.2 (*)    GFR, Estimated 55 (*)    All other components within normal limits  LACTIC ACID, PLASMA - Abnormal; Notable for the following components:   Lactic Acid, Venous 2.6 (*)    All other components within normal limits  CBC WITH DIFFERENTIAL/PLATELET - Abnormal; Notable for the following components:   WBC 20.3 (*)    RBC 3.39 (*)    Hemoglobin 9.4 (*)    HCT 27.5 (*)    Neutro Abs 16.4 (*)    Monocytes Absolute 2.2 (*)    Abs Immature  Granulocytes 0.10 (*)    All other components within normal limits  PROTIME-INR - Abnormal; Notable for the following components:   Prothrombin Time 16.7 (*)    INR 1.4 (*)    All other components within normal limits  CULTURE, BLOOD (ROUTINE X 2)  CULTURE, BLOOD (ROUTINE X 2)  RESP PANEL BY RT-PCR (FLU A&B, COVID) ARPGX2  AEROBIC/ANAEROBIC CULTURE W GRAM STAIN (SURGICAL/DEEP WOUND)  LACTIC ACID, PLASMA  URINALYSIS, ROUTINE W REFLEX MICROSCOPIC  APTT  URINALYSIS, COMPLETE (UACMP) WITH MICROSCOPIC  SEDIMENTATION RATE  C-REACTIVE PROTEIN  HEMOGLOBIN A1C   ____________________________________________  EKG  ____________________________________________  RADIOLOGY  ED MD interpretation:   Chest x-ray shows evidence of aortic atherosclerosis but no evidence of a focal consolidation, effusion, edema, pneumothorax or any other clear acute thoracic process.  Plain film of the right ankle shows a large ulcerated lesion on the lateral aspect with erosive changes and osteopenia in the subadjacent distal fibula and lateral talus consistent with osteomyelitis.  There is intra-articular gas at the anterior talofibular joint concerning for septic arthritis.  There is also extensive soft tissue swelling at the ankle with gas medially concerning for necrotizing infection.   Official radiology report(s): DG Chest 2 View  Result Date: 03/08/2021 CLINICAL DATA:  Suspected sepsis EXAM: CHEST - 2 VIEW COMPARISON:  02/22/2021. FINDINGS: Cardiac and mediastinal contours are within normal limits. Aortic atherosclerosis. No focal pulmonary opacity. No pleural effusion or pneumothorax. No acute osseous abnormality. IMPRESSION: No acute cardiopulmonary process. Aortic Atherosclerosis (ICD10-I70.0). Electronically Signed   By: Merilyn Baba M.D.   On: 03/08/2021 13:13   DG Ankle Complete Right  Result Date: 03/08/2021 CLINICAL DATA:  Suspected Sepsis EXAM: RIGHT ANKLE - COMPLETE 3+ VIEW COMPARISON:   February 08, 2021. FINDINGS: Large lateral ulcer at the ankle with erosive change in the subjacent distal fibula, compatible with osteomyelitis. Intra-articular gas at the anterior tibiotalar joint. Extensive soft tissue swelling at the ankle with gas medially. No evidence of acute fracture. No joint malalignment. IMPRESSION: 1. Large lateral ulcer at the ankle with extensive erosive change and osteopenia in the subjacent distal fibula and the lateral talus, compatible with osteomyelitis. Intra-articular gas at the anterior tibiotalar joint is concerning for direct communication/septic arthritis. 2. Extensive soft tissue swelling at the ankle with gas medially, concerning for cellulitis with gas-forming organism. Electronically Signed   By: Margaretha Sheffield M.D.   On: 03/08/2021 13:18    ____________________________________________   PROCEDURES  Procedure(s) performed (including Critical Care):  .Critical Care Performed by: Lucrezia Starch, MD Authorized by: Lucrezia Starch, MD   Critical care provider statement:    Critical care time (minutes):  30   Critical care was necessary to treat or prevent imminent or life-threatening deterioration of the following conditions:  Sepsis   Critical care was time spent personally by me on the following activities:  Development of treatment plan with patient or surrogate, discussions with consultants, evaluation of patient's response to treatment, examination of patient, ordering and review of laboratory studies, ordering and review of radiographic studies, ordering and performing treatments and interventions, pulse oximetry, re-evaluation of patient's condition and review of old charts   ____________________________________________   INITIAL IMPRESSION / [place] / ED COURSE      Patient presents with above-stated history and exam after being referred to the ED from wound center with concerns for acute on chronic infection in the right  ankle and possibly evolving osteomyelitis.  Patient does endorse worsening pain and swelling in the right ankle.  On arrival he is afebrile and hemodynamically stable.  He has a fairly large open purulent wound over the lateral ankle with diffuse swelling and tenderness about the foot medial malleolus and lower leg.  Differential considerations include septic joint, osteomyelitis, cellulitis and necrotizing infection as well as a DVT.  Chest x-ray shows evidence of aortic atherosclerosis but no evidence of a focal consolidation, effusion, edema, pneumothorax or any other clear acute thoracic process.  Plain film of the right ankle shows a large ulcerated lesion on the lateral aspect with erosive changes and osteopenia in the subadjacent distal fibula and lateral talus consistent with osteomyelitis.  There is intra-articular gas at the anterior talofibular joint concerning for septic arthritis.  There is also extensive soft tissue swelling at the ankle with gas medially concerning for necrotizing infection.   CMP remarkable for glucose of 172 without any other significant electrolyte or metabolic derangements.  However albumin is slightly low at 2.2.  Initial lactic acid elevated 2.6.  CBC with leukocytosis with WBC count of 20.3 and hemoglobin of 9.4 with normal platelets.  INR is within normal limits.  Given work-up findings concerning for necrotizing infection and septic joint I initially reached out to podiatry but after did not heard back in approximately 10 minutes I reached out to orthopedic surgery and consulted Dr. Rudene Christians who will evaluate the patient with plan for likely operative debridement possible amputation later this afternoon.  He recommended hospitalist admission in the meantime.  Admit to hospital service for further evaluation and management.  Code sepsis initiated due to leukocytosis with necrotizing infection and lactic acidosis.       ____________________________________________   FINAL CLINICAL IMPRESSION(S) / ED DIAGNOSES  Final diagnoses:  Septic arthritis of right ankle, due to unspecified organism ([place])  Osteomyelitis, unspecified site, unspecified type ([place])  Sepsis, due to unspecified organism, unspecified whether acute organ dysfunction present ([place])  Necrotizing soft tissue infection    Medications  vancomycin (VANCOCIN) IVPB 1000 mg/200 mL premix (has no administration in time range)    Followed by  vancomycin (VANCOCIN) IVPB 1000 mg/200 mL premix (has no administration in time range)  ceFEPIme (MAXIPIME) 2 g in sodium chloride 0.9 % 100 mL IVPB (2 g Intravenous New Bag/Given 03/08/21 1342)  lactated ringers infusion (has no administration in time range)  lactated ringers bolus 1,000 mL (has no administration in time range)  insulin aspart (novoLOG) injection 0-15 Units (has no administration in time range)  Tdap (BOOSTRIX) injection 0.5 mL (has no administration in time range)  lactated ringers bolus 1,000 mL (1,000 mLs Intravenous New Bag/Given 03/08/21 1332)  morphine 4 MG/ML injection 4 mg (4 mg Intravenous Given 03/08/21 1342)     ED Discharge Orders     None        Note:  This document was prepared using Dragon voice recognition software and may include unintentional dictation errors.    Lucrezia Starch, MD 03/08/21 [phone]

## 2021-03-08 NOTE — ED Triage Notes (Signed)
Pt here from wound care clinic needing antibiotics and possible amputation. Pt has osteomyelitis in the right ankle with worsening redness and swelling. Pt in NAD in triage.

## 2021-03-08 NOTE — Progress Notes (Addendum)
ERMIN, PARISIEN ([id number]) Visit Report for 03/08/2021 Arrival Information Details Patient Name: [name] [name], [name] [name] Date of Service: 03/08/2021 11:30 AM Medical Record Number: [id number] Patient Account Number: [id number] Date of Birth/Sex: 25-Nov-1940 (80 y.o. M) Treating RN: Huel Coventry Primary Care Mariany Mackintosh: Evangeline Gula, Carollee Herter Other Clinician: Referring Meghna Hagmann: Evangeline Gula, Carollee Herter Treating Zackary Mckeone/Extender: Rowan Blase in Treatment: 9 Visit Information History Since Last Visit Added or deleted any medications: No Patient Arrived: Wheel Chair Has Dressing in Place as Prescribed: Yes Arrival Time: 11:51 Pain Present Now: Yes Accompanied By: wife Transfer Assistance: Manual Patient Identification Verified: Yes Secondary Verification Process Completed: Yes Patient Requires Transmission-Based No Precautions: Patient Has Alerts: Yes Patient Alerts: Patient on Blood Thinner XARELTO Diabetic ABI left .87 TBI 0.26 ABI right .62 TBI 0.23 Electronic Signature(s) Signed: 03/08/2021 11:52:06 AM By: Elliot Gurney, BSN, RN, CWS, Kim RN, BSN Entered By: Elliot Gurney, BSN, RN, CWS, Kim on 03/08/2021 11:52:05 [name] [name] ([id number]) -------------------------------------------------------------------------------- Encounter Discharge Information Details Patient Name: [name] [name] Date of Service: 03/08/2021 11:30 AM Medical Record Number: [id number] Patient Account Number: [id number] Date of Birth/Sex: 03/23/1941 (80 y.o. M) Treating RN: Huel Coventry Primary Care Erleen Egner: Evangeline Gula, Carollee Herter Other Clinician: Referring Charday Capetillo: Evangeline Gula, Carollee Herter Treating Damontae Loppnow/Extender: Rowan Blase in Treatment: 9 Encounter Discharge Information Items Discharge Condition: Stable Ambulatory Status: Wheelchair Discharge Destination: Hospital Telephoned: Yes Orders Sent: No Transportation: Private Auto Accompanied By: wife Schedule Follow-up Appointment: No Clinical Summary of Care: Electronic  Signature(s) Signed: 03/08/2021 4:37:42 PM By: Elliot Gurney, BSN, RN, CWS, Kim RN, BSN Previous Signature: 03/08/2021 12:14:09 PM Version By: Elliot Gurney, BSN, RN, CWS, Kim RN, BSN Entered By: Elliot Gurney, BSN, RN, CWS, Kim on 03/08/2021 16:37:42 [name] [name] ([id number]) -------------------------------------------------------------------------------- Lower Extremity Assessment Details Patient Name: [name] [name] Date of Service: 03/08/2021 11:30 AM Medical Record Number: [id number] Patient Account Number: [id number] Date of Birth/Sex: [birth year] (80 y.o. M) Treating RN: Huel Coventry Primary Care Chiquetta Langner: Evangeline Gula, Carollee Herter Other Clinician: Referring Edona Schreffler: Evangeline Gula, Carollee Herter Treating Iosefa Weintraub/Extender: Allen Derry Weeks in Treatment: 9 Edema Assessment Assessed: [Left: No] Franne Forts: Yes] Edema: [Left: Ye] [Right: s] Vascular Assessment Pulses: Dorsalis Pedis Palpable: [Right:Yes] Electronic Signature(s) Signed: 03/08/2021 11:56:00 AM By: Elliot Gurney, BSN, RN, CWS, Kim RN, BSN Entered By: Elliot Gurney, BSN, RN, CWS, Kim on 03/08/2021 11:56:00 [name] [name] ([id number]) -------------------------------------------------------------------------------- Multi Wound Chart Details Patient Name: [name] [name] Date of Service: 03/08/2021 11:30 AM Medical Record Number: [id number] Patient Account Number: [id number] Date of Birth/Sex: Mar 31, 1941 (80 y.o. M) Treating RN: Huel Coventry Primary Care Sherice Ijames: Evangeline Gula, Carollee Herter Other Clinician: Referring Aveion Nguyen: Evangeline Gula, Carollee Herter Treating Jesenia Spera/Extender: Rowan Blase in Treatment: 9 Photos: [N/A:N/A] Wound Location: Right, Lateral Malleolus N/A N/A Wounding Event: Gradually Appeared N/A N/A Primary Etiology: Diabetic Wound/Ulcer of the Lower N/A N/A Extremity Comorbid History: Cataracts, Arrhythmia, Congestive N/A N/A Heart Failure, Hypertension, Type II Diabetes Date Acquired: 12/06/2020 N/A N/A Weeks of Treatment: 9 N/A N/A Wound Status: Open N/A  N/A Measurements L x W x D (cm) 7x3.5x2.5 N/A N/A Area (cm) : 19.242 N/A N/A Volume (cm) : 48.106 N/A N/A % Reduction in Area: -138.80% N/A N/A % Reduction in Volume: -646.20% N/A N/A Classification: Grade 3 N/A N/A Exudate Amount: Large N/A N/A Exudate Type: Purulent N/A N/A Exudate Color: yellow, brown, green N/A N/A Foul Odor After Cleansing: Yes N/A N/A Odor Anticipated Due to Product No N/A N/A Use: Wound Margin: Distinct, outline attached N/A N/A Granulation Amount: Small (1-33%) N/A N/A Granulation Quality: Red, Hyper-granulation N/A N/A Necrotic Amount: Large (67-100%) N/A N/A Necrotic Tissue:  Eschar, Adherent Slough N/A N/A Exposed Structures: Fat Layer (Subcutaneous Tissue): N/A N/A Yes Tendon: Yes Muscle: Yes Bone: Yes Fascia: No Joint: No Epithelialization: None N/A N/A Assessment Notes: Patient has fluid filled blister on the N/A N/A medial side of the ankle. Foot and lower leg is swollen and warm to touch. Treatment Notes Electronic Signature(s) Signed: 03/08/2021 12:00:47 PM By: Elliot Gurney, BSN, RN, CWS, Kim RN, BSN Entered By: Elliot Gurney, BSN, RN, CWS, Kim on 03/08/2021 12:00:46 [name] [name] ([id number]) -------------------------------------------------------------------------------- Multi-Disciplinary Care Plan Details Patient Name: [name] [name] Date of Service: 03/08/2021 11:30 AM Medical Record Number: [id number] Patient Account Number: [id number] Date of Birth/Sex: 1941-01-14 (80 y.o. M) Treating RN: Huel Coventry Primary Care Breezy Hertenstein: Evangeline Gula, Carollee Herter Other Clinician: Referring Jaymian Bogart: Evangeline Gula, Carollee Herter Treating Floetta Brickey/Extender: Rowan Blase in Treatment: 9 Active Inactive Electronic Signature(s) Signed: 03/30/2021 9:30:34 AM By: Elliot Gurney, BSN, RN, CWS, Kim RN, BSN Previous Signature: 03/08/2021 11:58:26 AM Version By: Elliot Gurney, BSN, RN, CWS, Kim RN, BSN Entered By: Elliot Gurney, BSN, RN, CWS, Kim on 03/30/2021 09:30:34 [name] [name]  ([id number]) -------------------------------------------------------------------------------- Pain Assessment Details Patient Name: [name] [name] Date of Service: 03/08/2021 11:30 AM Medical Record Number: [id number] Patient Account Number: [id number] Date of Birth/Sex: June 15, 1940 (80 y.o. M) Treating RN: Huel Coventry Primary Care Shakai Dolley: Evangeline Gula, Carollee Herter Other Clinician: Referring Orvil Faraone: Evangeline Gula, Carollee Herter Treating Stephanne Greeley/Extender: Allen Derry Weeks in Treatment: 9 Active Problems Location of Pain Severity and Description of Pain Patient Has Paino Yes Site Locations Pain Location: Pain in Ulcers Rate the pain. Current Pain Level: 10 Character of Pain Describe the Pain: Exhausting, Sharp, Shooting Pain Management and Medication Current Pain Management: Electronic Signature(s) Signed: 03/08/2021 11:52:40 AM By: Elliot Gurney, BSN, RN, CWS, Kim RN, BSN Entered By: Elliot Gurney, BSN, RN, CWS, Kim on 03/08/2021 11:52:40 [name] [name] ([id number]) -------------------------------------------------------------------------------- Patient/Caregiver Education Details Patient Name: [name] [name] Date of Service: 03/08/2021 11:30 AM Medical Record Number: [id number] Patient Account Number: [id number] Date of Birth/Gender: [birth year] (80 y.o. M) Treating RN: Huel Coventry Primary Care Physician: Evangeline Gula, Carollee Herter Other Clinician: Referring Physician: Evangeline Gula, Carollee Herter Treating Physician/Extender: Rowan Blase in Treatment: 9 Education Assessment Education Provided To: Patient Education Topics Provided Infection: Handouts: Infection Prevention and Management Methods: Demonstration, Explain/Verbal Responses: State content correctly Electronic Signature(s) Signed: 03/09/2021 4:58:16 PM By: Elliot Gurney, BSN, RN, CWS, Kim RN, BSN Entered By: Elliot Gurney, BSN, RN, CWS, Kim on 03/08/2021 16:36:42 [name] [name]  ([id number]) -------------------------------------------------------------------------------- Wound Assessment Details Patient Name: [name] [name] Date of Service: 03/08/2021 11:30 AM Medical Record Number: [id number] Patient Account Number: [id number] Date of Birth/Sex: 28-Dec-1940 (80 y.o. M) Treating RN: Huel Coventry Primary Care Doneshia Hill: Evangeline Gula, Carollee Herter Other Clinician: Referring Abbegale Stehle: Evangeline Gula, Carollee Herter Treating Sheila Ocasio/Extender: Allen Derry Weeks in Treatment: 9 Wound Status Wound Number: 1 Primary Diabetic Wound/Ulcer of the Lower Extremity Etiology: Wound Location: Right, Lateral Malleolus Wound Status: Open Wounding Event: Gradually Appeared Comorbid Cataracts, Arrhythmia, Congestive Heart Failure, Date Acquired: 12/06/2020 History: Hypertension, Type II Diabetes Weeks Of Treatment: 9 Clustered Wound: No Photos Photo Uploaded By: Elliot Gurney, BSN, RN, CWS, Kim on 03/08/2021 11:59:51 Wound Measurements Length: (cm) 7 Width: (cm) 3.5 Depth: (cm) 2.5 Area: (cm) 19.242 Volume: (cm) 48.106 % Reduction in Area: -138.8% % Reduction in Volume: -646.2% Epithelialization: None Wound Description Classification: Grade 3 Wound Margin: Distinct, outline attached Exudate Amount: Large Exudate Type: Purulent Exudate Color: yellow, brown, green Foul Odor After Cleansing: Yes Due to Product Use: No Slough/Fibrino Yes Wound Bed Granulation Amount: Small (1-33%) Exposed Structure Granulation Quality: Red, Hyper-granulation Fascia Exposed: No  Necrotic Amount: Large (67-100%) Fat Layer (Subcutaneous Tissue) Exposed: Yes Necrotic Quality: Eschar, Adherent Slough Tendon Exposed: Yes Muscle Exposed: Yes Necrosis of Muscle: No Joint Exposed: No Bone Exposed: Yes Assessment Notes Patient has fluid filled blister on the medial side of the ankle. Foot and lower leg is swollen and warm to touch. Electronic Signature(s) Signed: 03/08/2021 11:54:50 AM By: Elliot Gurney, BSN, RN, CWS,  Kim RN, BSN Entered By: Elliot Gurney, BSN, RN, CWS, Kim on 03/08/2021 11:54:49 [name] [name] ([id number]) Cephus Shelling, Jerolyn Shin ([id number]) -------------------------------------------------------------------------------- Vitals Details Patient Name: [name] [name] Date of Service: 03/08/2021 11:30 AM Medical Record Number: [id number] Patient Account Number: [id number] Date of Birth/Sex: 02-22-1941 (80 y.o. M) Treating RN: Huel Coventry Primary Care Jonte Wollam: Evangeline Gula, Carollee Herter Other Clinician: Referring Mayerli Kirst: Evangeline Gula, Carollee Herter Treating Taylen Wendland/Extender: Allen Derry Weeks in Treatment: 9 Vital Signs Time Taken: 11:45 Temperature (F): 98.4 Height (in): 72 Pulse (bpm): 84 Weight (lbs): 178 Respiratory Rate (breaths/min): 18 Body Mass Index (BMI): 24.1 Blood Pressure (mmHg): 122/72 Capillary Blood Glucose (mg/dl): 563 Reference Range: 80 - 120 mg / dl Electronic Signature(s) Signed: 03/08/2021 12:06:25 PM By: Aleda Grana RCP, RRT, CHT Entered By: Aleda Grana on 03/08/2021 12:01:38

## 2021-03-08 NOTE — ED Notes (Signed)
Patient sleeping at this time. Respirations even and unlabored. NAD noted at this time. 

## 2021-03-08 NOTE — ED Notes (Signed)
2nd set of blood cultures obtained, antibiotic stopped in order to get 2nd set

## 2021-03-08 NOTE — Consult Note (Signed)
Reason for Consult: Sepsis from right ankle wound Referring Physician: Dr. Katrinka Blazing ER physician  [name] [name] is an 80 y.o. male.  HPI: Patient is a 80 year old has been having problems with the lateral right ankle after a bug bite on the ankle several months ago it has not healed in fact he had angiography a month ago by Dr. Wyn Quaker that showed significant peripheral vascular disease.  He was in wound care today and hyperbaric treatment and was having increasing pain.  He has a very large wound with exposed bone on the lateral aspect ankle and a large blister to the medial ankle that is probably related to severe deep infection.  Past Medical History:  Diagnosis Date   CHF (congestive heart failure) (HCC)    Diabetes (HCC)    Dysrhythmia    AFIB   HTN (hypertension)    Infection    LEG   Pancreatitis     Past Surgical History:  Procedure Laterality Date   CATARACT EXTRACTION W/PHACO Right 03/08/2017   Procedure: CATARACT EXTRACTION PHACO AND INTRAOCULAR LENS PLACEMENT (IOC);  Surgeon: Nevada Crane, MD;  Location: [facility] ORS;  Service: Ophthalmology;  Laterality: Right;  Lot# [id number] H [place]: 00:30.9 AP%: 8.2 CDE: 2.54   CATARACT EXTRACTION W/PHACO Left 04/04/2017   Procedure: CATARACT EXTRACTION PHACO AND INTRAOCULAR LENS PLACEMENT (IOC);  Surgeon: Nevada Crane, MD;  Location: [facility] ORS;  Service: Ophthalmology;  Laterality: Left;  [place] 00:31.4 AP% 12.0 CDE 3.78 Fluid Pack lot # [id number]   CHOLECYSTECTOMY     EYE SURGERY     right eye   LOWER EXTREMITY ANGIOGRAPHY Right 02/10/2021   Procedure: LOWER EXTREMITY ANGIOGRAPHY;  Surgeon: Annice Needy, MD;  Location: [facility] INVASIVE CV LAB;  Service: Cardiovascular;  Laterality: Right;    No family history on file.  Social History:  reports that he has never smoked. His smokeless tobacco use includes chew. He reports that he does not drink alcohol and does not use drugs.  Allergies: No Known Allergies  Medications: I have reviewed the  patient's current medications.  Results for orders placed or performed during the hospital encounter of 03/08/21 (from the past 48 hour(s))  Comprehensive metabolic panel     Status: Abnormal   Collection Time: 03/08/21 12:35 PM  Result Value Ref Range   Sodium 134 (L) 135 - 145 mmol/L   Potassium 4.0 3.5 - 5.1 mmol/L   Chloride 98 98 - 111 mmol/L   CO2 27 22 - 32 mmol/L   Glucose, Bld 172 (H) 70 - 99 mg/dL    Comment: Glucose reference range applies only to samples taken after fasting for at least 8 hours.   BUN 21 8 - 23 mg/dL   Creatinine, Ser 8.18 (H) 0.61 - 1.24 mg/dL   Calcium 8.7 (L) 8.9 - 10.3 mg/dL   Total Protein 7.6 6.5 - 8.1 g/dL   Albumin 2.2 (L) 3.5 - 5.0 g/dL   AST 34 15 - 41 U/L   ALT 19 0 - 44 U/L   Alkaline Phosphatase 110 38 - 126 U/L   Total Bilirubin 0.6 0.3 - 1.2 mg/dL   GFR, Estimated 55 (L) >60 mL/min    Comment: (NOTE) Calculated using the CKD-EPI Creatinine Equation (2021)    Anion gap 9 5 - 15    Comment: Performed at [facility], [address] [place], [place] [address]  Lactic acid, plasma     Status: Abnormal   Collection Time: 03/08/21 12:35 PM  Result Value  Ref Range   Lactic Acid, Venous 2.6 (HH) 0.5 - 1.9 mmol/L    Comment: CRITICAL RESULT CALLED TO, READ BACK BY AND VERIFIED WITH STEPHANIE RED 03/08/21 1305 MU Performed at [facility], [address] [place], [place] [address]   CBC with Differential     Status: Abnormal   Collection Time: 03/08/21 12:35 PM  Result Value Ref Range   WBC 20.3 (H) 4.0 - 10.5 K/uL   RBC 3.39 (L) 4.22 - 5.81 MIL/uL   Hemoglobin 9.4 (L) 13.0 - 17.0 g/dL   HCT 76.1 (L) 60.7 - 37.1 %   MCV 81.1 80.0 - 100.0 fL   MCH 27.7 26.0 - 34.0 pg   MCHC 34.2 30.0 - 36.0 g/dL   RDW 06.2 69.4 - 85.4 %   Platelets 311 150 - 400 K/uL   nRBC 0.0 0.0 - 0.2 %   Neutrophils Relative % 80 %   Neutro Abs 16.4 (H) 1.7 - 7.7 K/uL   Lymphocytes Relative 8 %   Lymphs Abs 1.5 0.7 - 4.0 K/uL   Monocytes  Relative 11 %   Monocytes Absolute 2.2 (H) 0.1 - 1.0 K/uL   Eosinophils Relative 0 %   Eosinophils Absolute 0.0 0.0 - 0.5 K/uL   Basophils Relative 0 %   Basophils Absolute 0.0 0.0 - 0.1 K/uL   Immature Granulocytes 1 %   Abs Immature Granulocytes 0.10 (H) 0.00 - 0.07 K/uL    Comment: Performed at [facility], [address] [place], [place] [address]  Protime-INR     Status: Abnormal   Collection Time: 03/08/21 12:35 PM  Result Value Ref Range   Prothrombin Time 16.7 (H) 11.4 - 15.2 seconds   INR 1.4 (H) 0.8 - 1.2    Comment: (NOTE) INR goal varies based on device and disease states. Performed at [facility], [address] [place], [place] [address]   APTT     Status: Abnormal   Collection Time: 03/08/21 12:35 PM  Result Value Ref Range   aPTT 51 (H) 24 - 36 seconds    Comment:        IF BASELINE aPTT IS ELEVATED, SUGGEST PATIENT RISK ASSESSMENT BE USED TO DETERMINE APPROPRIATE ANTICOAGULANT THERAPY. Performed at [facility], [address] [place], [place] [address]   Sedimentation rate     Status: Abnormal   Collection Time: 03/08/21 12:35 PM  Result Value Ref Range   Sed Rate 126 (H) 0 - 20 mm/hr    Comment: Performed at [facility], [address] [place], [place] [address]    DG Chest 2 View  Result Date: 03/08/2021 CLINICAL DATA:  Suspected sepsis EXAM: CHEST - 2 VIEW COMPARISON:  02/22/2021. FINDINGS: Cardiac and mediastinal contours are within normal limits. Aortic atherosclerosis. No focal pulmonary opacity. No pleural effusion or pneumothorax. No acute osseous abnormality. IMPRESSION: No acute cardiopulmonary process. Aortic Atherosclerosis (ICD10-I70.0). Electronically Signed   By: Wiliam Ke M.D.   On: 03/08/2021 13:13   DG Ankle Complete Right  Result Date: 03/08/2021 CLINICAL DATA:  Suspected Sepsis EXAM: RIGHT ANKLE - COMPLETE 3+ VIEW COMPARISON:  February 08, 2021. FINDINGS: Large lateral ulcer at the  ankle with erosive change in the subjacent distal fibula, compatible with osteomyelitis. Intra-articular gas at the anterior tibiotalar joint. Extensive soft tissue swelling at the ankle with gas medially. No evidence of acute fracture. No joint malalignment. IMPRESSION: 1. Large lateral ulcer at the ankle with extensive erosive change and osteopenia in  the subjacent distal fibula and the lateral talus, compatible with osteomyelitis. Intra-articular gas at the anterior tibiotalar joint is concerning for direct communication/septic arthritis. 2. Extensive soft tissue swelling at the ankle with gas medially, concerning for cellulitis with gas-forming organism. Electronically Signed   By: Feliberto Harts M.D.   On: 03/08/2021 13:18    Review of Systems Blood pressure 104/60, pulse 76, temperature 97.6 F (36.4 C), temperature source Oral, resp. rate 18, height 6' (1.829 m), weight 86.2 kg, SpO2 97 %. Physical Exam As noted approximately 5 cm open wound on the lateral ankle with approximately 3 cm blister that is not yet open on the medial ankle pain with any motion with very foul smell.  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  I have talked to Dr. Wyn Quaker with regards to possible amputation for this problem and discussed with patient and his wife that this may be necessary.  Kennedy Bucker 03/08/2021, 3:04 PM

## 2021-03-08 NOTE — H&P (View-Only) (Signed)
[facility] Vascular Consult Note  MRN : [id number]  [name] [name] is a 80 y.o. ([birth year]) male who presents with chief complaint of  Chief Complaint  Patient presents with   Osteomyelitis    History of Present Illness:  Patient is a 80 year old has been having problems with the lateral right ankle after a bug bite on the ankle several months ago it has not healed in fact he had angiography a month ago by Dr. Wyn Quaker that showed significant peripheral vascular disease.  He was in wound care today and hyperbaric treatment and was having increasing pain.  He has a very large wound with exposed bone on the lateral aspect ankle and a large blister to the medial ankle that is probably related to severe deep infection.  Vascular surgery was consulted by Dr. Rosita Kea for possible amputation in the setting of sepsis/gangrene.  Current Facility-Administered Medications  Medication Dose Route Frequency Provider Last Rate Last Admin   insulin aspart (novoLOG) injection 0-15 Units  0-15 Units Subcutaneous Q4H Gilles Chiquito, MD       lactated ringers bolus 1,000 mL  1,000 mL Intravenous Once Gilles Chiquito, MD       lactated ringers infusion   Intravenous Continuous Gilles Chiquito, MD       Tdap Leda Min) injection 0.5 mL  0.5 mL Intramuscular Once Gilles Chiquito, MD       vancomycin (VANCOCIN) IVPB 1000 mg/200 mL premix  1,000 mg Intravenous Once Rauer, Robyne Peers, RPH 200 mL/hr at 03/08/21 1502 1,000 mg at 03/08/21 1502   Followed by   vancomycin (VANCOCIN) IVPB 1000 mg/200 mL premix  1,000 mg Intravenous Once Rauer, Robyne Peers, RPH       Current Outpatient Medications  Medication Sig Dispense Refill   acetaminophen (TYLENOL) 500 MG tablet Take 500-1,000 mg by mouth every 6 (six) hours as needed for mild pain or moderate pain.     atorvastatin (LIPITOR) 10 MG tablet Take 1 tablet (10 mg total) by mouth daily. 30 tablet 11   carvedilol (COREG) 6.25 MG tablet Take 6.25 mg  2 (two) times daily by mouth.     digoxin (LANOXIN) 0.125 MG tablet Take 0.125 mcg daily by mouth.     furosemide (LASIX) 20 MG tablet Take 20 mg daily by mouth.     ibuprofen (ADVIL) 200 MG tablet Take 400-800 mg by mouth every 6 (six) hours as needed for mild pain or moderate pain.     levofloxacin (LEVAQUIN) 500 MG tablet Take 500 mg by mouth daily.     lisinopril (PRINIVIL,ZESTRIL) 10 MG tablet Take 15 mg by mouth daily.     metFORMIN (GLUCOPHAGE) 1000 MG tablet Take 1,000 mg by mouth 2 (two) times daily with a meal.     rivaroxaban (XARELTO) 20 MG TABS tablet Take 20 mg daily by mouth.     vitamin B-12 (CYANOCOBALAMIN) 1000 MCG tablet Take 1,000 mcg by mouth daily.     aspirin EC 81 MG tablet Take 1 tablet (81 mg total) by mouth daily. (Patient not taking: Reported on 03/08/2021) 30 tablet 11   Past Medical History:  Diagnosis Date   CHF (congestive heart failure) (HCC)    Diabetes (HCC)    Dysrhythmia    AFIB   HTN (hypertension)    Infection    LEG   Pancreatitis    Past Surgical History:  Procedure Laterality Date   CATARACT EXTRACTION W/PHACO Right 03/08/2017   Procedure: CATARACT EXTRACTION  PHACO AND INTRAOCULAR LENS PLACEMENT (IOC);  Surgeon: Nevada Crane, MD;  Location: [facility] ORS;  Service: Ophthalmology;  Laterality: Right;  Lot# [id number] [place]: 00:30.9 AP%: 8.2 CDE: 2.54   CATARACT EXTRACTION W/PHACO Left 04/04/2017   Procedure: CATARACT EXTRACTION PHACO AND INTRAOCULAR LENS PLACEMENT (IOC);  Surgeon: Nevada Crane, MD;  Location: [facility] ORS;  Service: Ophthalmology;  Laterality: Left;  [place] 00:31.4 AP% 12.0 CDE 3.78 Fluid Pack lot # [id number]   CHOLECYSTECTOMY     EYE SURGERY     right eye   LOWER EXTREMITY ANGIOGRAPHY Right 02/10/2021   Procedure: LOWER EXTREMITY ANGIOGRAPHY;  Surgeon: Annice Needy, MD;  Location: [facility];  Service: Cardiovascular;  Laterality: Right;   Social History Social History   Tobacco Use   Smoking status: Never    Smokeless tobacco: Current    Types: Chew  Substance Use Topics   Alcohol use: No    Alcohol/week: 0.0 standard drinks   Drug use: No   Family History No family history on file. Denies family history of peripheral artery disease, venous disease or renal disease.  No Known Allergies  REVIEW OF SYSTEMS (Negative unless checked)  Constitutional: [] Weight loss  [] Fever  [] Chills Cardiac: [] Chest pain   [] Chest pressure   [] Palpitations   [] Shortness of breath when laying flat   [] Shortness of breath at rest   [] Shortness of breath with exertion. Vascular:  [x] Pain in legs with walking   [x] Pain in legs at rest   [x] Pain in legs when laying flat   [] Claudication   [x] Pain in feet when walking  [x] Pain in feet at rest  [x] Pain in feet when laying flat   [] History of DVT   [] Phlebitis   [] Swelling in legs   [] Varicose veins   [x] Non-healing ulcers Pulmonary:   [] Uses home oxygen   [] Productive cough   [] Hemoptysis   [] Wheeze  [] COPD   [] Asthma Neurologic:  [] Dizziness  [] Blackouts   [] Seizures   [] History of stroke   [] History of TIA  [] Aphasia   [] Temporary blindness   [] Dysphagia   [] Weakness or numbness in arms   [] Weakness or numbness in legs Musculoskeletal:  [] Arthritis   [] Joint swelling   [] Joint pain   [] Low back pain Hematologic:  [] Easy bruising  [] Easy bleeding   [] Hypercoagulable state   [] Anemic  [] Hepatitis Gastrointestinal:  [] Blood in stool   [] Vomiting blood  [] Gastroesophageal reflux/heartburn   [] Difficulty swallowing. Genitourinary:  [x] Chronic kidney disease   [] Difficult urination  [] Frequent urination  [] Burning with urination   [] Blood in urine Skin:  [] Rashes   [] Ulcers   [] Wounds Psychological:  [] History of anxiety   []  History of major depression.  Physical Examination  Vitals:   03/08/21 1220 03/08/21 1221 03/08/21 1322  BP: 109/79  104/60  Pulse: 76    Resp: 18    Temp: 97.6 F (36.4 C)    TempSrc: Oral    SpO2: 97%    Weight:  86.2 kg   Height:  6'  (1.829 m)    Body mass index is 25.77 kg/m. Gen:  WD/WN, NAD Head: [place]/AT, No temporalis wasting. Prominent temp pulse not noted. Ear/Nose/Throat: Hearing grossly intact, nares w/o erythema or drainage, oropharynx w/o Erythema/Exudate Eyes: Sclera non-icteric, conjunctiva clear Neck: Trachea midline.  No JVD.  Pulmonary:  Good air movement, respirations not labored, equal bilaterally.  Cardiac: RRR, normal S1, S2. Vascular:  Vessel Right Left  Radial Palpable Palpable  Ulnar Palpable Palpable  Brachial Palpable Palpable  Carotid Palpable, without bruit Palpable, without bruit  Aorta Not palpable N/A  Femoral Palpable Palpable  Popliteal Palpable Palpable  PT Non-Palpable Non-Palpable  DP Non-Palpable Non-Palpable   Right lower extremity: Thigh soft.  Calf soft.  Open wound noted to the lateral ankle.  Foul smell.  Gastrointestinal: soft, non-tender/non-distended. No guarding/reflex.  Musculoskeletal: M/S 5/5 throughout.  Extremities without ischemic changes.  No deformity or atrophy. No edema. Neurologic: Sensation grossly intact in extremities.  Symmetrical.  Speech is fluent. Motor exam as listed above. Psychiatric: Judgment intact, Mood & affect appropriate for pt's clinical situation. Dermatologic: As above  Lymph : No Cervical, Axillary, or Inguinal lymphadenopathy.  CBC Lab Results  Component Value Date   WBC 20.3 (H) 03/08/2021   HGB 9.4 (L) 03/08/2021   HCT 27.5 (L) 03/08/2021   MCV 81.1 03/08/2021   PLT 311 03/08/2021   BMET    Component Value Date/Time   NA 134 (L) 03/08/2021 1235   NA 143 02/24/2012 0325   K 4.0 03/08/2021 1235   K 4.1 02/24/2012 0325   CL 98 03/08/2021 1235   CL 108 (H) 02/24/2012 0325   CO2 27 03/08/2021 1235   CO2 26 02/24/2012 0325   GLUCOSE 172 (H) 03/08/2021 1235   GLUCOSE 120 (H) 02/24/2012 0325   BUN 21 03/08/2021 1235   BUN 25 (H) 02/24/2012 0325   CREATININE 1.32 (H) 03/08/2021 1235   CREATININE 1.47 (H) 02/24/2012 0325    CALCIUM 8.7 (L) 03/08/2021 1235   CALCIUM 9.1 02/24/2012 0325   GFRNONAA 55 (L) 03/08/2021 1235   GFRNONAA 48 (L) 02/24/2012 0325   GFRAA 55 (L) 02/24/2012 0325   Estimated Creatinine Clearance: 49.8 mL/min (A) (by C-G formula based on SCr of 1.32 mg/dL (H)).  COAG Lab Results  Component Value Date   INR 1.4 (H) 03/08/2021   Radiology DG Chest 2 View  Result Date: 03/08/2021 CLINICAL DATA:  Suspected sepsis EXAM: CHEST - 2 VIEW COMPARISON:  02/22/2021. FINDINGS: Cardiac and mediastinal contours are within normal limits. Aortic atherosclerosis. No focal pulmonary opacity. No pleural effusion or pneumothorax. No acute osseous abnormality. IMPRESSION: No acute cardiopulmonary process. Aortic Atherosclerosis (ICD10-I70.0). Electronically Signed   By: Alison  Vasan M.D.   On: 03/08/2021 13:13   DG Chest 2 View  Result Date: 02/22/2021 CLINICAL DATA:  Preop for hyperbaric oxygen treatment. Nonhealing right lower extremity wound. EXAM: CHEST - 2 VIEW COMPARISON:  02/22/2012 FINDINGS: The heart size and mediastinal contours are within normal limits. Both lungs are clear. The visualized skeletal structures are unremarkable. IMPRESSION: No active cardiopulmonary disease. Electronically Signed   By: John A Stahl M.D.   On: 02/22/2021 14:52   DG Ankle Complete Right  Result Date: 03/08/2021 CLINICAL DATA:  Suspected Sepsis EXAM: RIGHT ANKLE - COMPLETE 3+ VIEW COMPARISON:  February 08, 2021. FINDINGS: Large lateral ulcer at the ankle with erosive change in the subjacent distal fibula, compatible with osteomyelitis. Intra-articular gas at the anterior tibiotalar joint. Extensive soft tissue swelling at the ankle with gas medially. No evidence of acute fracture. No joint malalignment. IMPRESSION: 1. Large lateral ulcer at the ankle with extensive erosive change and osteopenia in the subjacent distal fibula and the lateral talus, compatible with osteomyelitis. Intra-articular gas at the anterior tibiotalar  joint is concerning for direct communication/septic arthritis. 2. Extensive soft tissue swelling at the ankle with gas medially, concerning for cellulitis with gas-forming organism. Electronically Signed   By: Frederick S Jones M.D.   On: 03/08/2021 13:18   MR   ANKLE RIGHT W WO CONTRAST  Result Date: 02/09/2021 CLINICAL DATA:  Lateral wound along the ankle, prior spider bite. EXAM: MRI OF THE RIGHT ANKLE WITHOUT AND WITH CONTRAST TECHNIQUE: Multiplanar, multisequence MR imaging of the ankle was performed before and after the administration of intravenous contrast. CONTRAST:  46mL GADAVIST GADOBUTROL 1 MMOL/ML IV SOLN COMPARISON:  Foot radiographs 01/25/2021 FINDINGS: TENDONS Peroneal: Mild common peroneus tendon sheath tenosynovitis. The tendon sheath is in close proximity to the large ulceration overlying the lateral malleolus. Posteromedial: Mild distal tibialis posterior tendinopathy. Anterior: Unremarkable Achilles: Unremarkable Plantar Fascia: Mildly thickened medial band of the plantar fascia with some low-grade edema along its upper margin suggesting minimal plantar fasciitis. LIGAMENTS Lateral: Attenuated anterior talofibular ligament suggesting a remote tear. Medial: Mildly thickened superomedial portion of the spring ligament. The deltoid ligament appears intact. CARTILAGE Ankle Joint: Small tibiotalar joint effusion. No bony destructive findings along the joint. Subtalar Joints/Sinus Tarsi: Mild degenerative arthropathy. Bones: There is osteomyelitis of the distal fibula, with the large lateral soft tissue ulceration extending down to the periosteal surface and with substantial marrow edema and endosteal enhancement especially laterally in the distal fibula and tracking up into the distal fibular metaphysis, as shown on 25 series 6. Subtle endosteal edema along the lateral portion of the calcaneus is probably reactive. Focal marrow edema proximally in the cuboid on image 27 of series 4 is probably  secondary to arthropathy. Other: Substantial edema medially along the ankle and in the dorsum of the foot, with little if any measurable enhancement, accordingly nonspecific for cellulitis. Accentuated T2 signal in the musculature of the foot, most likely from neurogenic edema. IMPRESSION: IMPRESSION 1. Very large ulceration of the lateral ankle exposing the periosteal margin of the distal fibula, with abnormal edema and endosteal enhancement laterally in the distal fibula compatible with osteomyelitis. 2. Mild common peroneus tendon sheath tenosynovitis. The tendon sheath is in close proximity to the ulceration. 3. Mild plantar fasciitis involving the medial band. 4. Mild distal tibialis posterior tendinopathy, with mildly thickened adjacent superomedial portion of the spring ligament. Correlate clinically in assessing for tibialis posterior dysfunction. 5. Attenuated ATFL suggesting a remote tear. 6. Substantial subcutaneous edema in the dorsum of the foot and along the medial ankle, without definite enhancement, therefore nonspecific for cellulitis versus non infectious edema. Electronically Signed   By: Gaylyn Rong M.D.   On: 02/09/2021 16:51   PERIPHERAL VASCULAR CATHETERIZATION  Result Date: 02/10/2021 See surgical note for result.   Assessment/Plan Patient is a 80 year old has been having problems with the lateral right ankle after a bug bite on the ankle several months ago it has not healed in fact he had angiography a month ago by Dr. Wyn Quaker that showed significant peripheral vascular disease who presents to the [facility] emergency department and found to be septic  1.  Chronic/septic right ankle: Patient with known history of atherosclerotic disease to the right lower extremity status post endovascular intervention approximately 1 month ago however presents with nonhealing ulceration to the lateral ankle.  Patient has been under the care of the wound center however  presented to the [facility] emergency department septic.  Source most likely from chronic right wound.  In an attempt to clear the infection/sepsis would recommend guillotine amputation followed by few days to allow the limb to drain then possibly an above-the-knee amputation for completion.  [name] at the bedside during this consult.  I discussed a guillotine amputation.  Risks benefits and the  procedure itself.  Questions were answered.  Patient wishes to proceed.  Currently contact the OR to see if we can take him to the operating room tonight or early tomorrow.  Patient is being admitted for sepsis and will start IV antibiotics.  2.  Diabetes: On appropriate medication. Encouraged good control as its slows the progression of atherosclerotic disease   3.  Hyperlipidemia: On statin for medical management Encouraged good control as its slows the progression of atherosclerotic disease   Addendum: The operating room will be able to accommodate [place] tomorrow at 930. Preop the patient.  Discussed with Dr. Weldon Inches, PA-C 03/08/2021 3:27 PM  This note was created with Dragon medical transcription system.  Any error is purely unintentional.

## 2021-03-08 NOTE — ED Notes (Signed)
Pt helped to bedside toilet and back into bed with side rails in place and monitor initiated. Urine output of emptied post void. Pts right heel wound dressed and warm blanket provided to pt.

## 2021-03-08 NOTE — ED Notes (Signed)
Resumed care from lindsay rn.  Pt alert.  Pt in hallway bed waiting on admission bed.

## 2021-03-08 NOTE — Progress Notes (Signed)
Pharmacy Antibiotic Note  [name] [name] is a 80 y.o. male admitted on 03/08/2021. Pharmacy has been consulted for vancomycin and cefepime dosing for wound infection.  Plan: Vancomycin 2000 mg given in the ED. Will follow with vancomycin 1250 mg IV q24h.  Goal AUC 400-550 Expected AUC: 440 SCr used: 1.32 (consistent with and slightly lower than historical trend)   Height: 6' (182.9 cm) Weight: 86.2 kg (190 lb) IBW/kg (Calculated) : 77.6  Temp (24hrs), Avg:97.6 F (36.4 C), Min:97.6 F (36.4 C), Max:97.6 F (36.4 C)  Recent Labs  Lab 03/08/21 1235 03/08/21 1457  WBC 20.3*  --   CREATININE 1.32*  --   LATICACIDVEN 2.6* 2.1*    Estimated Creatinine Clearance: 49.8 mL/min (A) (by C-G formula based on SCr of 1.32 mg/dL (H)).    No Known Allergies  Antimicrobials this admission: Vancomycin 11/15 >> Cefepime 11/15 >>   Microbiology results: 11/15 BCx: pending   Thank you for allowing pharmacy to be a part of this patient's care.  Pricilla Riffle, PharmD, BCPS Clinical Pharmacist 03/08/2021 7:13 PM

## 2021-03-08 NOTE — Sepsis Progress Note (Signed)
Notified bedside nurse of need to draw repeat lactic acid. 

## 2021-03-09 ENCOUNTER — Inpatient Hospital Stay: Payer: Medicare HMO | Admitting: Certified Registered"

## 2021-03-09 ENCOUNTER — Encounter
Admission: EM | Disposition: A | Discharge status: Skilled Nursing Facility | Payer: Self-pay | Source: Home / Self Care | Attending: Internal Medicine

## 2021-03-09 ENCOUNTER — Encounter: Payer: Self-pay | Admitting: Internal Medicine

## 2021-03-09 ENCOUNTER — Ambulatory Visit: Payer: Medicare HMO | Admitting: Internal Medicine

## 2021-03-09 ENCOUNTER — Encounter: Payer: Medicare HMO | Admitting: Internal Medicine

## 2021-03-09 DIAGNOSIS — D509 Iron deficiency anemia, unspecified: Secondary | ICD-10-CM | POA: Diagnosis not present

## 2021-03-09 DIAGNOSIS — D72828 Other elevated white blood cell count: Secondary | ICD-10-CM

## 2021-03-09 DIAGNOSIS — A419 Sepsis, unspecified organism: Secondary | ICD-10-CM | POA: Diagnosis not present

## 2021-03-09 DIAGNOSIS — M86171 Other acute osteomyelitis, right ankle and foot: Secondary | ICD-10-CM | POA: Diagnosis not present

## 2021-03-09 HISTORY — PX: AMPUTATION: SHX166

## 2021-03-09 LAB — CBG MONITORING, ED
Glucose-Capillary: 78 mg/dL (ref 70–99)
Glucose-Capillary: 73 mg/dL (ref 70–99)
Glucose-Capillary: 58 mg/dL — ABNORMAL LOW (ref 70–99)
Glucose-Capillary: 98 mg/dL (ref 70–99)
Glucose-Capillary: 107 mg/dL — ABNORMAL HIGH (ref 70–99)

## 2021-03-09 LAB — GLUCOSE, CAPILLARY
Glucose-Capillary: 190 mg/dL — ABNORMAL HIGH (ref 70–99)
Glucose-Capillary: 172 mg/dL — ABNORMAL HIGH (ref 70–99)

## 2021-03-09 LAB — APTT: aPTT: 45 seconds — ABNORMAL HIGH (ref 24–36)

## 2021-03-09 LAB — COMPREHENSIVE METABOLIC PANEL
ALT: 15 U/L (ref 0–44)
AST: 32 U/L (ref 15–41)
Albumin: 2.1 g/dL — ABNORMAL LOW (ref 3.5–5.0)
Alkaline Phosphatase: 113 U/L (ref 38–126)
Anion gap: 7 (ref 5–15)
BUN: 16 mg/dL (ref 8–23)
CO2: 27 mmol/L (ref 22–32)
Calcium: 8.6 mg/dL — ABNORMAL LOW (ref 8.9–10.3)
Chloride: 101 mmol/L (ref 98–111)
Creatinine, Ser: 1.15 mg/dL (ref 0.61–1.24)
GFR, Estimated: >60 mL/min (ref >60)
Glucose, Bld: 78 mg/dL (ref 70–99)
Potassium: 4 mmol/L (ref 3.5–5.1)
Sodium: 135 mmol/L (ref 135–145)
Total Bilirubin: 0.9 mg/dL (ref 0.3–1.2)
Total Protein: 6.8 g/dL (ref 6.5–8.1)

## 2021-03-09 LAB — MAGNESIUM: Magnesium: 1.8 mg/dL (ref 1.7–2.4)

## 2021-03-09 LAB — PROTIME-INR
INR: 1.4 — ABNORMAL HIGH (ref 0.8–1.2)
Prothrombin Time: 17.5 seconds — ABNORMAL HIGH (ref 11.4–15.2)

## 2021-03-09 LAB — CBC
HCT: 23.5 % — ABNORMAL LOW (ref 39.0–52.0)
Hemoglobin: 8.2 g/dL — ABNORMAL LOW (ref 13.0–17.0)
MCH: 27.3 pg (ref 26.0–34.0)
MCHC: 34.9 g/dL (ref 30.0–36.0)
MCV: 78.3 fL — ABNORMAL LOW (ref 80.0–100.0)
Platelets: 244 10*3/uL (ref 150–400)
RBC: 3 MIL/uL — ABNORMAL LOW (ref 4.22–5.81)
RDW: 14.1 % (ref 11.5–15.5)
WBC: 26.6 10*3/uL — ABNORMAL HIGH (ref 4.0–10.5)
nRBC: 0 % (ref 0.0–0.2)

## 2021-03-09 LAB — LACTIC ACID, PLASMA
Lactic Acid, Venous: 1.3 mmol/L (ref 0.5–1.9)
Lactic Acid, Venous: 3 mmol/L (ref 0.5–1.9)
Lactic Acid, Venous: 3.1 mmol/L (ref 0.5–1.9)

## 2021-03-09 LAB — PHOSPHORUS: Phosphorus: 2.7 mg/dL (ref 2.5–4.6)

## 2021-03-09 LAB — ABO/RH: ABO/RH(D): A POS

## 2021-03-09 SURGERY — AMPUTATION, FOOT, PARTIAL
Anesthesia: General | Site: Foot | Laterality: Right

## 2021-03-09 MED ORDER — FUROSEMIDE 20 MG PO TABS
20.0000 mg | ORAL_TABLET | Freq: Every day | ORAL | Status: DC
  Administered 2021-03-09: 20 mg via ORAL
  Filled 2021-03-09: qty 1

## 2021-03-09 MED ORDER — ATORVASTATIN CALCIUM 10 MG PO TABS
10.0000 mg | ORAL_TABLET | Freq: Every day | ORAL | Status: DC
  Administered 2021-03-09 – 2021-03-15 (×7): 10 mg via ORAL
  Filled 2021-03-09 (×7): qty 1

## 2021-03-09 MED ORDER — LISINOPRIL 10 MG PO TABS
15.0000 mg | ORAL_TABLET | Freq: Every day | ORAL | Status: DC
  Administered 2021-03-09: 15 mg via ORAL
  Filled 2021-03-09: qty 2

## 2021-03-09 MED ORDER — CEFAZOLIN SODIUM-DEXTROSE 2-3 GM-%(50ML) IV SOLR
INTRAVENOUS | Status: DC | PRN
  Administered 2021-03-09: 2 g via INTRAVENOUS

## 2021-03-09 MED ORDER — CARVEDILOL 6.25 MG PO TABS
6.2500 mg | ORAL_TABLET | Freq: Two times a day (BID) | ORAL | Status: DC
  Administered 2021-03-09 – 2021-03-16 (×13): 6.25 mg via ORAL
  Filled 2021-03-09 (×14): qty 1

## 2021-03-09 MED ORDER — PROPOFOL 10 MG/ML IV BOLUS
INTRAVENOUS | Status: DC | PRN
  Administered 2021-03-09: 140 mg via INTRAVENOUS

## 2021-03-09 MED ORDER — CHLORHEXIDINE GLUCONATE 0.12 % MT SOLN
OROMUCOSAL | Status: AC
  Filled 2021-03-09: qty 15

## 2021-03-09 MED ORDER — INSULIN ASPART 100 UNIT/ML IJ SOLN
0.0000 [IU] | Freq: Three times a day (TID) | INTRAMUSCULAR | Status: DC
Start: 2021-03-09 — End: 2021-03-12
  Administered 2021-03-09 – 2021-03-10 (×2): 2 [IU] via SUBCUTANEOUS
  Filled 2021-03-09 (×2): qty 1

## 2021-03-09 MED ORDER — ACETAMINOPHEN 10 MG/ML IV SOLN
INTRAVENOUS | Status: AC
  Filled 2021-03-09: qty 100

## 2021-03-09 MED ORDER — ACETAMINOPHEN 10 MG/ML IV SOLN
INTRAVENOUS | Status: DC | PRN
  Administered 2021-03-09: 1000 mg via INTRAVENOUS

## 2021-03-09 MED ORDER — ROCURONIUM BROMIDE 100 MG/10ML IV SOLN
INTRAVENOUS | Status: DC | PRN
  Administered 2021-03-09: 40 mg via INTRAVENOUS

## 2021-03-09 MED ORDER — DEXTROSE 50 % IV SOLN
INTRAVENOUS | Status: AC
  Administered 2021-03-09: 25 mL via INTRAVENOUS
  Filled 2021-03-09: qty 50

## 2021-03-09 MED ORDER — OXYCODONE HCL 5 MG PO TABS
5.0000 mg | ORAL_TABLET | ORAL | Status: DC | PRN
Start: 2021-03-09 — End: 2021-03-16
  Administered 2021-03-10: 5 mg via ORAL
  Administered 2021-03-12: 10 mg via ORAL
  Administered 2021-03-16 (×2): 5 mg via ORAL
  Filled 2021-03-09: qty 1
  Filled 2021-03-09 (×2): qty 2
  Filled 2021-03-09: qty 1

## 2021-03-09 MED ORDER — DOCUSATE SODIUM 100 MG PO CAPS: 200.0000 mg | ORAL_CAPSULE | Freq: Two times a day (BID) | ORAL | Status: DC | PRN

## 2021-03-09 MED ORDER — VASOPRESSIN 20 UNIT/ML IV SOLN
INTRAVENOUS | Status: DC | PRN
  Administered 2021-03-09 (×2): 4 [IU] via INTRAVENOUS

## 2021-03-09 MED ORDER — DEXAMETHASONE SODIUM PHOSPHATE 10 MG/ML IJ SOLN
INTRAMUSCULAR | Status: DC | PRN
  Administered 2021-03-09: 10 mg via INTRAVENOUS

## 2021-03-09 MED ORDER — EPHEDRINE SULFATE 50 MG/ML IJ SOLN
INTRAMUSCULAR | Status: DC | PRN
  Administered 2021-03-09: 10 mg via INTRAVENOUS

## 2021-03-09 MED ORDER — 0.9 % SODIUM CHLORIDE (POUR BTL) OPTIME
TOPICAL | Status: DC | PRN
  Administered 2021-03-09: 1000 mL

## 2021-03-09 MED ORDER — GLYCOPYRROLATE 0.2 MG/ML IJ SOLN
INTRAMUSCULAR | Status: DC | PRN
  Administered 2021-03-09: .2 mg via INTRAVENOUS

## 2021-03-09 MED ORDER — LIDOCAINE HCL (CARDIAC) PF 100 MG/5ML IV SOSY
PREFILLED_SYRINGE | INTRAVENOUS | Status: DC | PRN
  Administered 2021-03-09: 80 mg via INTRAVENOUS

## 2021-03-09 MED ORDER — ONDANSETRON HCL 4 MG/2ML IJ SOLN
INTRAMUSCULAR | Status: DC | PRN
  Administered 2021-03-09: 4 mg via INTRAVENOUS

## 2021-03-09 MED ORDER — DEXTROSE 50 % IV SOLN: 25.0000 mL | Freq: Once | INTRAVENOUS | Status: AC

## 2021-03-09 MED ORDER — FENTANYL CITRATE (PF) 100 MCG/2ML IJ SOLN: 25.0000 ug | INTRAMUSCULAR | Status: DC | PRN

## 2021-03-09 MED ORDER — SODIUM CHLORIDE 0.9 % IV SOLN: INTRAVENOUS | Status: DC

## 2021-03-09 MED ORDER — DIGOXIN 125 MCG PO TABS
0.1250 mg | ORAL_TABLET | Freq: Every day | ORAL | Status: DC
  Administered 2021-03-09 – 2021-03-16 (×7): 0.125 mg via ORAL
  Filled 2021-03-09 (×8): qty 1

## 2021-03-09 MED ORDER — INSULIN ASPART 100 UNIT/ML IJ SOLN: 0.0000 [IU] | Freq: Every day | INTRAMUSCULAR | Status: DC

## 2021-03-09 MED ORDER — CHLORHEXIDINE GLUCONATE 0.12 % MT SOLN
15.0000 mL | Freq: Once | OROMUCOSAL | Status: AC
  Administered 2021-03-09: 15 mL via OROMUCOSAL

## 2021-03-09 MED ORDER — ONDANSETRON HCL 4 MG/2ML IJ SOLN: 4.0000 mg | Freq: Three times a day (TID) | INTRAMUSCULAR | Status: DC | PRN

## 2021-03-09 MED ORDER — VITAMIN B-12 1000 MCG PO TABS
1000.0000 ug | ORAL_TABLET | Freq: Every day | ORAL | Status: DC
  Administered 2021-03-09 – 2021-03-16 (×7): 1000 ug via ORAL
  Filled 2021-03-09 (×8): qty 1

## 2021-03-09 MED ORDER — PHENYLEPHRINE HCL (PRESSORS) 10 MG/ML IV SOLN
INTRAVENOUS | Status: DC | PRN
  Administered 2021-03-09 (×2): 200 ug via INTRAVENOUS

## 2021-03-09 MED ORDER — VANCOMYCIN HCL 1500 MG/300ML IV SOLN
1500.0000 mg | INTRAVENOUS | Status: AC
  Administered 2021-03-10 – 2021-03-13 (×3): 1500 mg via INTRAVENOUS
  Filled 2021-03-09 (×4): qty 300

## 2021-03-09 MED ORDER — FENTANYL CITRATE (PF) 100 MCG/2ML IJ SOLN
INTRAMUSCULAR | Status: DC | PRN
  Administered 2021-03-09 (×2): 50 ug via INTRAVENOUS

## 2021-03-09 MED ORDER — SUGAMMADEX SODIUM 200 MG/2ML IV SOLN
INTRAVENOUS | Status: DC | PRN
  Administered 2021-03-09: 300 mg via INTRAVENOUS

## 2021-03-09 MED ORDER — GABAPENTIN 100 MG PO CAPS
100.0000 mg | ORAL_CAPSULE | Freq: Three times a day (TID) | ORAL | Status: DC
  Administered 2021-03-09 – 2021-03-16 (×20): 100 mg via ORAL
  Filled 2021-03-09 (×21): qty 1

## 2021-03-09 MED ORDER — FENTANYL CITRATE (PF) 100 MCG/2ML IJ SOLN
INTRAMUSCULAR | Status: AC
  Filled 2021-03-09: qty 2

## 2021-03-09 SURGICAL SUPPLY — 48 items
BLADE OSCILLATING/SAGITTAL (BLADE) ×1
BLADE SAW SAG 29X58X.64 (BLADE) ×1 IMPLANT
BLADE SW THK.38XMED LNG THN (BLADE) ×1 IMPLANT
BNDG COHESIVE 4X5 TAN ST LF (GAUZE/BANDAGES/DRESSINGS) ×2 IMPLANT
BNDG ELASTIC 6X5.8 VLCR NS LF (GAUZE/BANDAGES/DRESSINGS) ×2 IMPLANT
BNDG GAUZE ELAST 4 BULKY (GAUZE/BANDAGES/DRESSINGS) ×4 IMPLANT
BRUSH SCRUB EZ  4% CHG (MISCELLANEOUS) ×1
BRUSH SCRUB EZ 4% CHG (MISCELLANEOUS) ×1 IMPLANT
CANISTER WOUND CARE 500ML ATS (WOUND CARE) ×1 IMPLANT
CHLORAPREP W/TINT 26 (MISCELLANEOUS) ×2 IMPLANT
DRAPE INCISE IOBAN 66X45 STRL (DRAPES) ×1 IMPLANT
DRSG VAC ATS MED SENSATRAC (GAUZE/BANDAGES/DRESSINGS) ×1 IMPLANT
ELECT CAUTERY BLADE 6.4 (BLADE) ×2 IMPLANT
ELECT REM PT RETURN 9FT ADLT (ELECTROSURGICAL) ×2
ELECTRODE REM PT RTRN 9FT ADLT (ELECTROSURGICAL) ×1 IMPLANT
GAUZE 4X4 16PLY ~~LOC~~+RFID DBL (SPONGE) ×2 IMPLANT
GAUZE XEROFORM 1X8 LF (GAUZE/BANDAGES/DRESSINGS) ×2 IMPLANT
GLOVE SURG ENC MOIS LTX SZ7 (GLOVE) ×2 IMPLANT
GLOVE SURG SYN 7.0 (GLOVE) ×2 IMPLANT
GLOVE SURG SYN 7.0 PF PI (GLOVE) ×1 IMPLANT
GLOVE SURG UNDER LTX SZ7.5 (GLOVE) ×2 IMPLANT
GOWN STRL REUS W/ TWL LRG LVL3 (GOWN DISPOSABLE) ×1 IMPLANT
GOWN STRL REUS W/ TWL XL LVL3 (GOWN DISPOSABLE) ×2 IMPLANT
GOWN STRL REUS W/TWL LRG LVL3 (GOWN DISPOSABLE) ×1
GOWN STRL REUS W/TWL XL LVL3 (GOWN DISPOSABLE) ×2
HANDLE YANKAUER SUCT BULB TIP (MISCELLANEOUS) ×2 IMPLANT
HOLSTER BOVIE DISP (MISCELLANEOUS) ×1 IMPLANT
KIT TURNOVER KIT A (KITS) ×2 IMPLANT
LABEL OR SOLS (LABEL) ×1 IMPLANT
MANIFOLD NEPTUNE II (INSTRUMENTS) ×2 IMPLANT
NS IRRIG 1000ML POUR BTL (IV SOLUTION) ×2 IMPLANT
PACK EXTREMITY ARMC (MISCELLANEOUS) ×2 IMPLANT
PAD ABD DERMACEA PRESS 5X9 (GAUZE/BANDAGES/DRESSINGS) ×2 IMPLANT
PAD NEG PRESSURE SENSATRAC (MISCELLANEOUS) ×1 IMPLANT
PAD PREP 24X41 OB/GYN DISP (PERSONAL CARE ITEMS) ×2 IMPLANT
SPONGE T-LAP 18X18 ~~LOC~~+RFID (SPONGE) ×3 IMPLANT
STAPLER SKIN PROX 35W (STAPLE) IMPLANT
STOCKINETTE M/LG 89821 (MISCELLANEOUS) ×2 IMPLANT
SUT ETHILON 3-0 FS-10 30 BLK (SUTURE)
SUT SILK 2 0 (SUTURE)
SUT SILK 2 0 SH (SUTURE) IMPLANT
SUT SILK 2-0 18XBRD TIE 12 (SUTURE) ×1 IMPLANT
SUT SILK 3 0 (SUTURE)
SUT SILK 3-0 18XBRD TIE 12 (SUTURE) ×1 IMPLANT
SUT VIC AB 0 CT1 36 (SUTURE) ×2 IMPLANT
SUT VIC AB 2-0 CT1 (SUTURE) ×2 IMPLANT
SUTURE EHLN 3-0 FS-10 30 BLK (SUTURE) ×2 IMPLANT
WATER STERILE IRR 500ML POUR (IV SOLUTION) ×1 IMPLANT

## 2021-03-09 NOTE — ED Notes (Signed)
Assisted patient to the bedside commode, 2 RN assist

## 2021-03-09 NOTE — Progress Notes (Addendum)
PROGRESS NOTE   HPI was taken from Dr. Thomes Dinning: [name] [name] is a 80 y.o. male with medical history significant for essential hypertension, type 2 diabetes mellitus, atrial fibrillation on Xarelto, CHF, mixed hyperlipidemia who presents to the emergency department accompanied by wife due to several day onset of worsening pain from chronic wound of right ankle.  Patient has been following with wound care center for 2 to 3 months, hyperbaric treatment was recently started within last 2 weeks (per wife at bedside), patient went to the wound care center today due to several day onset of worsening pain from the wound and inflammation of right ankle.  After evaluation at the wound clinic, he was asked to go to the ED for further evaluation due to concern for acute osteomyelitis and possible septic joint.  Patient denies chest pain, shortness of breath, fever, chills, nausea, vomiting.  Patient was unsure of being on antibiotics.  He states that he stopped taking his Xarelto few days ago.   ED Course:  In the emergency department, he was hemodynamically stable, though BP was soft at 104/60.  Work-up in the ED showed leukocytosis with a left shift.  Normocytic anemia, hyponatremia, hypoalbuminemia, hyperglycemia, lactic acid 2.6 > 2.1.  Sed rate 126.  Influenza A, B, SARS coronavirus 2 was negative. Right ankle x-ray showed large lateral also, the ankle with suspicion for osteomyelitis.  Intra-articular gas at the anterior tibiotalar joint is concerning for direct complication/septic arthritis.  Extensive soft tissue swelling at the ankle with gas medially, concern for cellulitis with gas-forming organism. Chest x-ray showed no acute cardiopulmonary process. Patient was treated with IV cefepime and vancomycin.  IV hydration was provided, morphine was given due to pain.  Hospitalist was asked to admit patient for further evaluation and management.     [name] [name]  QMG:[id number] DOB: 1940-07-07 DOA:  03/08/2021 PCP: Lorenso Quarry, NP   Assessment & Plan:   Principal Problem:   Acute osteomyelitis of right ankle [facility]) Active Problems:   Atrial fibrillation (HCC)   CHF (congestive heart failure) (HCC)   Open wound of right ankle   Lactic acidosis   Hypoalbuminemia due to protein-calorie malnutrition (HCC)   Hyperglycemia due to diabetes mellitus (HCC)   Hyponatremia   Mixed hyperlipidemia   Peripheral arterial disease (HCC)   Acute osteomyelitis of right ankle: open wound of right ankle with suspicious septic arthritis. Hx of PAD. S/p 1 month of right lower extremity endovascular intervention. 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 Continue on IV vanco, cefepime. Can likely narrow or d/c abxs if margins are clear.   Microcytic anemia: H&H are labile. Will check iron studies  Leukocytosis: likely secondary to above infection. Continue on IV abxs  Lactic acidosis: resolved  Hyponatremia: resolved   DM2: likely poorly controlled. Continue on SSI w/ accuchecks   Hypoalbuminemia: secondary to moderate protein calorie malnutrition. Continue on nutritional supplements   Hx of CHF: unknown systolic vs diastolic vs combined. Appears compensated currently. Continue on coreg, statin, aspirin. Continue to hold home dose of ACE-I secondary to low normal BP   HTN: continue on coreg. Continue on to hold lisinopril secondary to low normal BP  Chronic atrial fibrillation: continue on coreg, xarelto    HLD: continue on statin   DVT prophylaxis: SCDs Code Status: full  Family Communication:  Disposition Plan: unclear, PT/OT will need to see pt   Level of care: Med-Surg  Status is: Inpatient  Remains inpatient appropriate  because: severity of illness, s/p open guillotine amputation of right lower extremity on 11/16        Consultants:  Vascular surg Ortho surg   Procedures:  Antimicrobials: vanco, cefepime    Subjective: Pt  c/o malaise   Objective: Vitals:   03/09/21 0500 03/09/21 0530 03/09/21 0630 03/09/21 0700  BP: (!) 100/55 124/62 124/67 119/62  Pulse: (!) 113 (!) 117 (!) 113 (!) 109  Resp: 14 14 19 19   Temp:      TempSrc:      SpO2: 100% 100% 97% 100%  Weight:      Height:        Intake/Output Summary (Last 24 hours) at 03/09/2021 0906 Last data filed at 03/09/2021 03/11/2021 Gross per 24 hour  Intake 3100 ml  Output --  Net 3100 ml   Filed Weights   03/08/21 1221  Weight: 86.2 kg    Examination:  General exam: Appears calm and comfortable  Respiratory system: Clear to auscultation. Respiratory effort normal. Cardiovascular system: S1 & S2 +. No rubs, gallops or clicks.  Gastrointestinal system: Abdomen is nondistended, soft and nontender. Normal bowel sounds heard. Central nervous system: Alert and oriented. Moves all extremities  Psychiatry: Judgement and insight appear normal. Flat mood and affect    Data Reviewed: I have personally reviewed following labs and imaging studies  CBC: Recent Labs  Lab 03/08/21 1235 03/09/21 0611  WBC 20.3* 26.6*  NEUTROABS 16.4*  --   HGB 9.4* 8.2*  HCT 27.5* 23.5*  MCV 81.1 78.3*  PLT 311 244   Basic Metabolic Panel: Recent Labs  Lab 03/08/21 1235 03/09/21 0611  NA 134* 135  K 4.0 4.0  CL 98 101  CO2 27 27  GLUCOSE 172* 78  BUN 21 16  CREATININE 1.32* 1.15  CALCIUM 8.7* 8.6*  MG  --  1.8  PHOS  --  2.7   GFR: Estimated Creatinine Clearance: 57.2 mL/min (by C-G formula based on SCr of 1.15 mg/dL). Liver Function Tests: Recent Labs  Lab 03/08/21 1235 03/09/21 0611  AST 34 32  ALT 19 15  ALKPHOS 110 113  BILITOT 0.6 0.9  PROT 7.6 6.8  ALBUMIN 2.2* 2.1*   No results for input(s): LIPASE, AMYLASE in the last 168 hours. No results for input(s): AMMONIA in the last 168 hours. Coagulation Profile: Recent Labs  Lab 03/08/21 1235 03/09/21 0611  INR 1.4* 1.4*   Cardiac Enzymes: No results for input(s): CKTOTAL, CKMB,  CKMBINDEX, TROPONINI in the last 168 hours. BNP (last 3 results) No results for input(s): PROBNP in the last 8760 hours. HbA1C: Recent Labs    03/08/21 1235  HGBA1C 4.9   CBG: Recent Labs  Lab 03/08/21 1509 03/08/21 1558 03/08/21 2039 03/09/21 0306 03/09/21 0837  GLUCAP 100* 104* 95 78 73   Lipid Profile: No results for input(s): CHOL, HDL, LDLCALC, TRIG, CHOLHDL, LDLDIRECT in the last 72 hours. Thyroid Function Tests: No results for input(s): TSH, T4TOTAL, FREET4, T3FREE, THYROIDAB in the last 72 hours. Anemia Panel: No results for input(s): VITAMINB12, FOLATE, FERRITIN, TIBC, IRON, RETICCTPCT in the last 72 hours. Sepsis Labs: Recent Labs  Lab 03/08/21 1235 03/08/21 1457  LATICACIDVEN 2.6* 2.1*    Recent Results (from the past 240 hour(s))  Culture, blood (Routine x 2)     Status: None (Preliminary result)   Collection Time: 03/08/21 12:35 PM   Specimen: BLOOD  Result Value Ref Range Status   Specimen Description BLOOD BLOOD RIGHT FOREARM  Final  Special Requests   Final    BOTTLES DRAWN AEROBIC AND ANAEROBIC Blood Culture adequate volume   Culture   Final    NO GROWTH < 24 HOURS Performed at [facility], [address] [place], [place] [address]    Report Status PENDING  Incomplete  Culture, blood (Routine x 2)     Status: None (Preliminary result)   Collection Time: 03/08/21  2:04 PM   Specimen: BLOOD  Result Value Ref Range Status   Specimen Description BLOOD left arm  Final   Special Requests   Final    BOTTLES DRAWN AEROBIC AND ANAEROBIC Blood Culture results may not be optimal due to an inadequate volume of blood received in culture bottles   Culture   Final    NO GROWTH < 24 HOURS Performed at [facility], [address], [place], [place] [address]    Report Status PENDING  Incomplete  Resp Panel by RT-PCR (Flu A&B, Covid) Nasopharyngeal Swab     Status: None   Collection Time: 03/08/21  2:05 PM   Specimen: Nasopharyngeal  Swab; Nasopharyngeal(NP) swabs in vial transport medium  Result Value Ref Range Status   SARS Coronavirus 2 by RT PCR NEGATIVE NEGATIVE Final    Comment: (NOTE) SARS-CoV-2 target nucleic acids are NOT DETECTED.  The SARS-CoV-2 RNA is generally detectable in upper respiratory specimens during the acute phase of infection. The lowest concentration of SARS-CoV-2 viral copies this assay can detect is 138 copies/mL. A negative result does not preclude SARS-Cov-2 infection and should not be used as the sole basis for treatment or other patient management decisions. A negative result may occur with  improper specimen collection/handling, submission of specimen other than nasopharyngeal swab, presence of viral mutation(s) within the areas targeted by this assay, and inadequate number of viral copies(<138 copies/mL). A negative result must be combined with clinical observations, patient history, and epidemiological information. The expected result is Negative.  Fact Sheet for Patients:  BloggerCourse.com  Fact Sheet for Healthcare Providers:  SeriousBroker.it  This test is no t yet approved or cleared by the [place] FDA and  has been authorized for detection and/or diagnosis of SARS-CoV-2 by FDA under an Emergency Use Authorization (EUA). This EUA will remain  in effect (meaning this test can be used) for the duration of the COVID-19 declaration under Section 564(b)(1) of the Act, 21 U.S.C.section 360bbb-3(b)(1), unless the authorization is terminated  or revoked sooner.       Influenza A by PCR NEGATIVE NEGATIVE Final   Influenza B by PCR NEGATIVE NEGATIVE Final    Comment: (NOTE) The Xpert Xpress SARS-CoV-2/FLU/RSV plus assay is intended as an aid in the diagnosis of influenza from Nasopharyngeal swab specimens and should not be used as a sole basis for treatment. Nasal washings and aspirates are unacceptable for Xpert Xpress  SARS-CoV-2/FLU/RSV testing.  Fact Sheet for Patients: BloggerCourse.com  Fact Sheet for Healthcare Providers: SeriousBroker.it  This test is not yet approved or cleared by the [place] FDA and has been authorized for detection and/or diagnosis of SARS-CoV-2 by FDA under an Emergency Use Authorization (EUA). This EUA will remain in effect (meaning this test can be used) for the duration of the COVID-19 declaration under Section 564(b)(1) of the Act, 21 U.S.C. section 360bbb-3(b)(1), unless the authorization is terminated or revoked.  Performed at [facility], [address], [place], [place] [address]          Radiology Studies: DG Chest 2 View  Result Date: 03/08/2021 CLINICAL DATA:  Suspected sepsis EXAM: CHEST - 2 VIEW COMPARISON:  02/22/2021. FINDINGS: Cardiac and mediastinal contours are within normal limits. Aortic atherosclerosis. No focal pulmonary opacity. No pleural effusion or pneumothorax. No acute osseous abnormality. IMPRESSION: No acute cardiopulmonary process. Aortic Atherosclerosis (ICD10-I70.0). Electronically Signed   By: Wiliam Ke M.D.   On: 03/08/2021 13:13   DG Ankle Complete Right  Result Date: 03/08/2021 CLINICAL DATA:  Suspected Sepsis EXAM: RIGHT ANKLE - COMPLETE 3+ VIEW COMPARISON:  February 08, 2021. FINDINGS: Large lateral ulcer at the ankle with erosive change in the subjacent distal fibula, compatible with osteomyelitis. Intra-articular gas at the anterior tibiotalar joint. Extensive soft tissue swelling at the ankle with gas medially. No evidence of acute fracture. No joint malalignment. IMPRESSION: 1. Large lateral ulcer at the ankle with extensive erosive change and osteopenia in the subjacent distal fibula and the lateral talus, compatible with osteomyelitis. Intra-articular gas at the anterior tibiotalar joint is concerning for direct communication/septic arthritis. 2. Extensive  soft tissue swelling at the ankle with gas medially, concerning for cellulitis with gas-forming organism. Electronically Signed   By: Feliberto Harts M.D.   On: 03/08/2021 13:18        Scheduled Meds:  feeding supplement (GLUCERNA SHAKE)  237 mL Oral TID BM   insulin aspart  0-15 Units Subcutaneous Q4H   Continuous Infusions:  ceFEPime (MAXIPIME) IV Stopped (03/09/21 0217)   lactated ringers 150 mL/hr at 03/09/21 0303   vancomycin       LOS: 1 day    Time spent: 30 mins     Charise Killian, MD [facility] Pager [phone]  If 7PM-7AM, please contact night-coverage 03/09/2021, 9:06 AM

## 2021-03-09 NOTE — Anesthesia Preprocedure Evaluation (Signed)
Anesthesia Evaluation  Patient identified by MRN, date of birth, ID band Patient awake    Reviewed: Allergy & Precautions, NPO status , Patient's Chart, lab work & pertinent test results, reviewed documented beta blocker date and time   History of Anesthesia Complications Negative for: history of anesthetic complications  Airway Mallampati: II  TM Distance: >3 FB Neck ROM: Full    Dental  (+) Poor Dentition, Edentulous Upper, Edentulous Lower   Pulmonary neg pulmonary ROS, neg sleep apnea, neg COPD,    breath sounds clear to auscultation- rhonchi (-) wheezing      Cardiovascular Exercise Tolerance: Good hypertension, Pt. on medications and Pt. on home beta blockers + Peripheral Vascular Disease and +CHF  (-) CAD, (-) Past MI and (-) Cardiac Stents + dysrhythmias Atrial Fibrillation  Rhythm:Regular Rate:Normal - Systolic murmurs and - Diastolic murmurs    Neuro/Psych negative neurological ROS  negative psych ROS   GI/Hepatic Neg liver ROS, GERD  ,  Endo/Other  diabetes, Oral Hypoglycemic Agents  Renal/GU negative Renal ROS     Musculoskeletal negative musculoskeletal ROS (+)   Abdominal (+) - obese,   Peds  Hematology negative hematology ROS (+)   Anesthesia Other Findings CHF (congestive heart failure) (HCC) Diabetes (HCC)    Dysrhythmia  AFIB  HTN (hypertension)    Infection  LEG  Pancreatitis       Reproductive/Obstetrics                            Anesthesia Physical  Anesthesia Plan  ASA: 3  Anesthesia Plan: General   Post-op Pain Management:    Induction: Intravenous  PONV Risk Score and Plan: 2 and Midazolam and Propofol infusion  Airway Management Planned: Oral ETT  Additional Equipment:   Intra-op Plan:   Post-operative Plan: Extubation in OR  Informed Consent: I have reviewed the patients History and Physical, chart, labs and discussed the procedure including  the risks, benefits and alternatives for the proposed anesthesia with the patient or authorized representative who has indicated his/her understanding and acceptance.       Plan Discussed with: CRNA, Anesthesiologist and Surgeon  Anesthesia Plan Comments:        Anesthesia Quick Evaluation

## 2021-03-09 NOTE — Transfer of Care (Signed)
Immediate Anesthesia Transfer of Care Note  Patient: [name] [name]  Procedure(s) Performed: AMPUTATION FOOT (Right: Foot)  Patient Location: PACU  Anesthesia Type:General  Level of Consciousness: awake, drowsy and patient cooperative  Airway & Oxygen Therapy: Patient Spontanous Breathing and Patient connected to face mask oxygen  Post-op Assessment: Report given to RN and Post -op Vital signs reviewed and stable  Post vital signs: Reviewed and stable  Last Vitals:  Vitals Value Taken Time  BP 126/72 03/09/21 1117  Temp 36.4 C 03/09/21 1117  Pulse    Resp 17 03/09/21 1119  SpO2    Vitals shown include unvalidated device data.  Last Pain:  Vitals:   03/09/21 0949  TempSrc: Temporal  PainSc: 5          Complications: No notable events documented.

## 2021-03-09 NOTE — Anesthesia Procedure Notes (Signed)
Procedure Name: Intubation Date/Time: 03/09/2021 10:52 AM Performed by: Mohammed Kindle, CRNA Pre-anesthesia Checklist: Patient identified, Emergency Drugs available, Suction available and Patient being monitored Patient Re-evaluated:Patient Re-evaluated prior to induction Oxygen Delivery Method: Circle system utilized Preoxygenation: Pre-oxygenation with 100% oxygen Induction Type: IV induction Ventilation: Mask ventilation without difficulty Laryngoscope Size: McGraph and 3 Grade View: Grade I Tube type: Oral Tube size: 6.5 mm Number of attempts: 1 Airway Equipment and Method: Stylet and Oral airway Placement Confirmation: ETT inserted through vocal cords under direct vision, positive ETCO2, breath sounds checked- equal and bilateral and CO2 detector Secured at: 21 cm Tube secured with: Tape Dental Injury: Teeth and Oropharynx as per pre-operative assessment

## 2021-03-09 NOTE — Progress Notes (Signed)
Lactic acid 3.0 critical lab notified MD.

## 2021-03-09 NOTE — Interval H&P Note (Signed)
History and Physical Interval Note:  03/09/2021 10:17 AM  [name] [name]  has presented today for surgery, with the diagnosis of PAD.  The various methods of treatment have been discussed with the patient and family. After consideration of risks, benefits and other options for treatment, the patient has consented to  Procedure(s): AMPUTATION FOOT (Right) as a surgical intervention.  The patient's history has been reviewed, patient examined, no change in status, stable for surgery.  I have reviewed the patient's chart and labs.  Questions were answered to the patient's satisfaction.     Festus Barren

## 2021-03-09 NOTE — Progress Notes (Signed)
Pharmacy Antibiotic Note  [name] [name] is a 80 y.o. male admitted on 03/08/2021. Pharmacy has been consulted for vancomycin and cefepime dosing for wound infection.  Renal function improved today Scr 1.32 > 1.15.   Plan: Increase vancomycin to 1500 mg every 24 hours. Goal AUC 400-550 Expected AUC: 466 SCr used: 1.15 (continues to be slightly lower than historical trend)  Monitor renal function, clinical course, LOT.  Height: 6' (182.9 cm) Weight: 86.2 kg (190 lb) IBW/kg (Calculated) : 77.6  Temp (24hrs), Avg:98 F (36.7 C), Min:97.4 F (36.3 C), Max:98.8 F (37.1 C)  Recent Labs  Lab 03/08/21 1235 03/08/21 1457 03/09/21 0611 03/09/21 1152 03/09/21 1502  WBC 20.3*  --  26.6*  --   --   CREATININE 1.32*  --  1.15  --   --   LATICACIDVEN 2.6* 2.1*  --  1.3 3.0*     Estimated Creatinine Clearance: 57.2 mL/min (by C-G formula based on SCr of 1.15 mg/dL).    No Known Allergies  Antimicrobials this admission: Vancomycin 11/15 >> Cefepime 11/15 >>   Microbiology results: 11/15 BCx: pending   Thank you for allowing pharmacy to be a part of this patient's care.  Jaynie Bream, PharmD, BCPS Clinical Pharmacist 03/09/2021 6:41 PM

## 2021-03-09 NOTE — Anesthesia Postprocedure Evaluation (Signed)
Anesthesia Post Note  Patient: [name] [name]  Procedure(s) Performed: AMPUTATION FOOT (Right: Foot)  Patient location during evaluation: PACU Anesthesia Type: General Level of consciousness: awake and alert, awake and oriented Pain management: pain level controlled Vital Signs Assessment: post-procedure vital signs reviewed and stable Respiratory status: spontaneous breathing, nonlabored ventilation and respiratory function stable Cardiovascular status: blood pressure returned to baseline and stable Postop Assessment: no apparent nausea or vomiting Anesthetic complications: no   No notable events documented.   Last Vitals:  Vitals:   03/09/21 1200 03/09/21 1229  BP: (!) 101/55 109/72  Pulse: 98 95  Resp: 19 19  Temp: 36.9 C 36.4 C  SpO2: 95% 99%    Last Pain:  Vitals:   03/09/21 1229  TempSrc: Oral  PainSc:                  Manfred Arch

## 2021-03-09 NOTE — Op Note (Signed)
[facility]   OPERATIVE NOTE  DATE: 03/09/2021  PRE-OPERATIVE DIAGNOSIS: Sepsis from severe wound infection right foot and ankle, PAD  POST-OPERATIVE DIAGNOSIS: same as above  PROCEDURE: 1.   Open (guillotine) amputation of the right lower leg  SURGEON: Festus Barren  ASSISTANT(S): None  ANESTHESIA: General  ESTIMATED BLOOD LOSS: 10 cc  FINDING(S): 1.  None   INDICATIONS:   [name] [name] is a 80 y.o. male who presents with sepsis from an infected ulceration of the right foot and ankle.  He clearly needs a amputation to clear the infection but if we do a formal closed amputation has a very high risk of wound infection.  No open amputation is performed to remove the infection then we will stage a definitive above-knee amputation in several days.  Risks and benefits were discussed and informed consent was obtained.  DESCRIPTION: After obtaining full informed written consent, the patient was brought back to the operating room and placed supine upon the operating table.  The patient was prepped and draped in the standard fashion.  A circular incision was made several centimeters above the right ankle and above the wound.  We dissected down through the soft tissue with the scalpel and then with electrocautery.  The tibia and the fibula were transected with the oscillating saw.  Knife was then used to complete the posterior flap.  The anterior tibial artery was identified as the only bleeding vessel distally and ligated and divided between silk ties.  The specimen was taken off the field.  The wound was inspected and no further obvious purulence was seen.  A negative pressure dressing was then cut to fit the wound and strips of Ioban were used for good occlusive seal. At this point, we elected to complete the procedure.  The patient was taken to the recovery room in stable condition.   COMPLICATIONS: None  CONDITION: Stable  Festus Barren  03/09/2021, 11:27 AM    This  note was created with Dragon Medical transcription system. Any errors in dictation are purely unintentional.

## 2021-03-10 ENCOUNTER — Ambulatory Visit (INDEPENDENT_AMBULATORY_CARE_PROVIDER_SITE_OTHER): Payer: Medicare HMO | Admitting: Nurse Practitioner

## 2021-03-10 ENCOUNTER — Other Ambulatory Visit (INDEPENDENT_AMBULATORY_CARE_PROVIDER_SITE_OTHER): Payer: Self-pay | Admitting: Vascular Surgery

## 2021-03-10 ENCOUNTER — Encounter: Payer: Medicare HMO | Admitting: Physician Assistant

## 2021-03-10 ENCOUNTER — Encounter (INDEPENDENT_AMBULATORY_CARE_PROVIDER_SITE_OTHER): Payer: Medicare HMO

## 2021-03-10 DIAGNOSIS — E785 Hyperlipidemia, unspecified: Secondary | ICD-10-CM

## 2021-03-10 DIAGNOSIS — I482 Chronic atrial fibrillation, unspecified: Secondary | ICD-10-CM | POA: Diagnosis not present

## 2021-03-10 DIAGNOSIS — I10 Essential (primary) hypertension: Secondary | ICD-10-CM

## 2021-03-10 DIAGNOSIS — D72829 Elevated white blood cell count, unspecified: Secondary | ICD-10-CM

## 2021-03-10 DIAGNOSIS — M86171 Other acute osteomyelitis, right ankle and foot: Secondary | ICD-10-CM | POA: Diagnosis not present

## 2021-03-10 DIAGNOSIS — I509 Heart failure, unspecified: Secondary | ICD-10-CM | POA: Diagnosis not present

## 2021-03-10 DIAGNOSIS — E119 Type 2 diabetes mellitus without complications: Secondary | ICD-10-CM

## 2021-03-10 DIAGNOSIS — E872 Acidosis, unspecified: Secondary | ICD-10-CM | POA: Diagnosis not present

## 2021-03-10 DIAGNOSIS — Z8679 Personal history of other diseases of the circulatory system: Secondary | ICD-10-CM

## 2021-03-10 LAB — GLUCOSE, CAPILLARY
Glucose-Capillary: 119 mg/dL — ABNORMAL HIGH (ref 70–99)
Glucose-Capillary: 203 mg/dL — ABNORMAL HIGH (ref 70–99)
Glucose-Capillary: 162 mg/dL — ABNORMAL HIGH (ref 70–99)
Glucose-Capillary: 75 mg/dL (ref 70–99)

## 2021-03-10 LAB — HEMOGLOBIN A1C
Hgb A1c MFr Bld: 5 % (ref 4.8–5.6)
Mean Plasma Glucose: 96.8 mg/dL

## 2021-03-10 LAB — BASIC METABOLIC PANEL
Anion gap: 6 (ref 5–15)
BUN: 23 mg/dL (ref 8–23)
CO2: 25 mmol/L (ref 22–32)
Calcium: 7.7 mg/dL — ABNORMAL LOW (ref 8.9–10.3)
Chloride: 106 mmol/L (ref 98–111)
Creatinine, Ser: 1.31 mg/dL — ABNORMAL HIGH (ref 0.61–1.24)
GFR, Estimated: 55 mL/min — ABNORMAL LOW (ref >60)
Glucose, Bld: 141 mg/dL — ABNORMAL HIGH (ref 70–99)
Potassium: 4.2 mmol/L (ref 3.5–5.1)
Sodium: 137 mmol/L (ref 135–145)

## 2021-03-10 LAB — IRON AND TIBC
Iron: 28 ug/dL — ABNORMAL LOW (ref 45–182)
Saturation Ratios: 22 % (ref 17.9–39.5)
TIBC: 127 ug/dL — ABNORMAL LOW (ref 250–450)
UIBC: 99 ug/dL

## 2021-03-10 LAB — CBC
HCT: 19.6 % — ABNORMAL LOW (ref 39.0–52.0)
Hemoglobin: 6.8 g/dL — ABNORMAL LOW (ref 13.0–17.0)
MCH: 27.8 pg (ref 26.0–34.0)
MCHC: 34.7 g/dL (ref 30.0–36.0)
MCV: 80 fL (ref 80.0–100.0)
Platelets: 248 10*3/uL (ref 150–400)
RBC: 2.45 MIL/uL — ABNORMAL LOW (ref 4.22–5.81)
RDW: 14.3 % (ref 11.5–15.5)
WBC: 20 10*3/uL — ABNORMAL HIGH (ref 4.0–10.5)
nRBC: 0 % (ref 0.0–0.2)

## 2021-03-10 LAB — MAGNESIUM: Magnesium: 2.1 mg/dL (ref 1.7–2.4)

## 2021-03-10 LAB — FERRITIN: Ferritin: 416 ng/mL — ABNORMAL HIGH (ref 24–336)

## 2021-03-10 LAB — SURGICAL PATHOLOGY

## 2021-03-10 LAB — PREPARE RBC (CROSSMATCH)

## 2021-03-10 MED ORDER — FUROSEMIDE 10 MG/ML IJ SOLN
20.0000 mg | Freq: Once | INTRAMUSCULAR | Status: DC
  Filled 2021-03-10 (×2): qty 4

## 2021-03-10 MED ORDER — ASCORBIC ACID 500 MG PO TABS
500.0000 mg | ORAL_TABLET | Freq: Two times a day (BID) | ORAL | Status: DC
  Administered 2021-03-10 – 2021-03-16 (×11): 500 mg via ORAL
  Filled 2021-03-10 (×12): qty 1

## 2021-03-10 MED ORDER — OCUVITE-LUTEIN PO CAPS
1.0000 | ORAL_CAPSULE | Freq: Every day | ORAL | Status: DC
  Administered 2021-03-12 – 2021-03-16 (×5): 1 via ORAL
  Filled 2021-03-10 (×6): qty 1

## 2021-03-10 MED ORDER — ACETAMINOPHEN 325 MG PO TABS
650.0000 mg | ORAL_TABLET | Freq: Once | ORAL | Status: AC
  Administered 2021-03-10: 14:00:00 650 mg via ORAL
  Filled 2021-03-10: qty 2

## 2021-03-10 MED ORDER — DIPHENHYDRAMINE HCL 50 MG/ML IJ SOLN
25.0000 mg | Freq: Once | INTRAMUSCULAR | Status: AC
  Administered 2021-03-10: 14:00:00 25 mg via INTRAVENOUS
  Filled 2021-03-10: qty 1

## 2021-03-10 MED ORDER — ENSURE MAX PROTEIN PO LIQD
11.0000 [oz_av] | Freq: Three times a day (TID) | ORAL | Status: DC
  Administered 2021-03-10 – 2021-03-16 (×16): 11 [oz_av] via ORAL
  Filled 2021-03-10: qty 330

## 2021-03-10 MED ORDER — SODIUM CHLORIDE 0.9 % IV SOLN: INTRAVENOUS | Status: DC

## 2021-03-10 MED ORDER — SODIUM CHLORIDE 0.9% IV SOLUTION: Freq: Once | INTRAVENOUS | Status: AC

## 2021-03-10 NOTE — Progress Notes (Signed)
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  States pain has improved status post guillotine amputation of the right foot.  Objective: Vitals:   03/09/21 2002 03/10/21 0432 03/10/21 0748 03/10/21 1036  BP: 94/61 (!) 85/46 97/62 111/65  Pulse: 74 88 73 73  Resp: 18 18 18    Temp: 97.6 F (36.4 C) 97.9 F (36.6 C) 97.8 F (36.6 C)   TempSrc:  Oral Oral   SpO2: 99% 98% 100% 100%  Weight:      Height:        Intake/Output Summary (Last 24 hours) at 03/10/2021 1134 Last data filed at 03/10/2021 1031 Gross per 24 hour  Intake 1581.86 ml  Output 550 ml  Net 1031.86 ml   Physical Exam: A&Ox3, NAD CV: RRR Pulmonary: CTA Bilaterally Abdomen: Soft, Nontender, Nondistended Vascular:  Right lower extremity: Thigh soft.  Calf soft.  Extremities warm distally.  VAC is intact and to suction.   Laboratory: CBC    Component Value Date/Time   WBC 20.0 (H) 03/10/2021 0446   HGB 6.8 (L) 03/10/2021 0446   HGB 14.7 02/24/2012 0325   HCT 19.6 (L) 03/10/2021 0446   HCT 44.4 02/24/2012 0325   PLT 248 03/10/2021 0446   PLT 108 (L) 02/24/2012 0325   BMET    Component Value Date/Time   NA 137 03/10/2021 0446   NA 143 02/24/2012 0325   K 4.2 03/10/2021 0446   K 4.1 02/24/2012 0325   CL 106 03/10/2021 0446   CL 108 (H) 02/24/2012 0325   CO2 25 03/10/2021 0446   CO2 26 02/24/2012 0325   GLUCOSE 141 (H) 03/10/2021 0446   GLUCOSE 120 (H) 02/24/2012 0325   BUN 23 03/10/2021 0446   BUN 25 (H) 02/24/2012 0325   CREATININE 1.31 (H) 03/10/2021 0446   CREATININE 1.47 (H) 02/24/2012 0325   CALCIUM 7.7 (L) 03/10/2021 0446   CALCIUM 9.1 02/24/2012 0325   GFRNONAA 55 (L) 03/10/2021 0446   GFRNONAA 48 (L) 02/24/2012 0325   GFRAA 55 (L) 02/24/2012 0325   Assessment/Planning: The patient is a 80 year old male who presented to the [facility]  emergency department with a progressively worsening wound/septic joint now status post guillotine amputation of the right foot - POD#1  1) Patient with improvement to discomfort now status post guillotine. 2) VAC is intact and to suction draining serosanguineous fluid. 3) We will plan on returning to the operating room tomorrow for a above-the-knee amputation. 4) Hemoglobin 6.8.  Will transfuse 1 unit packed red blood cells. 5) NPO after midnight.  Discussed with Dr. [facility] Jaquelyn Sakamoto PA-C 03/10/2021 11:34 AM

## 2021-03-10 NOTE — Progress Notes (Signed)
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  Ocuvite daily for wound healing (provides zinc, vitamin A, vitamin C, Vitamin E, copper, and selenium)  Vitamin C 541m po BID   NUTRITION DIAGNOSIS:   Moderate Malnutrition related to chronic illness (CHF, DM) as evidenced by moderate fat depletion, moderate muscle depletion.  GOAL:   Patient will meet greater than or equal to 90% of their needs  MONITOR:   PO intake, Supplement acceptance, Labs, Weight trends, Skin, I & O's  REASON FOR ASSESSMENT:   Consult Wound healing  ASSESSMENT:   80y/o male with h/o Afib, CHF, PAD, DM, GERD and HTN who is admitted with diabetic foot wound.  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 Pt reports good appetite and oral intake at baseline. Pt reports that his oral intake has been fairly well in hospital; pt documented to be eating 50-90% of meals and reports eating > 75% of his lunch today. Pt has been drinking 3 Glucerna supplements daily in hospital; pt reports that he drinks Boost at home. Pt lives at home with his wife who prepares all of his meals. RD discussed with pt the importance of adequate nutrition needed to support wound healing. RD will change pt to Ensure as this provides more protein than Glucerna. RD will also add vitamins to support wound healing. Per chart, pt appears weight stable pta; pt reports his UBW is ~190-200lbs. Pt scheduled for R AKA tomorrow.   Medications reviewed and include: lasix, insulin, B12, NaCl _0 /hr, cefepime, vancomycin   Labs reviewed: K 4.2 wnl, creat 1.31(H) Wbc- 20.0(H), Hgb 6.8(L), Hct 19.6(L) Cbgs- 203, 119 x 24 hrs AIC 5.0- 11/17  NUTRITION - FOCUSED PHYSICAL EXAM:  Flowsheet Row Most Recent Value  Orbital  Region Mild depletion  Upper Arm Region Severe depletion  Thoracic and Lumbar Region Moderate depletion  Buccal Region Moderate depletion  Temple Region Moderate depletion  Clavicle Bone Region Mild depletion  Clavicle and Acromion Bone Region Mild depletion  Scapular Bone Region Mild depletion  Dorsal Hand Moderate depletion  Patellar Region Moderate depletion  Anterior Thigh Region Moderate depletion  Posterior Calf Region Moderate depletion  Edema (RD Assessment) None  Hair Reviewed  Eyes Reviewed  Mouth Reviewed  Skin Reviewed  Nails Reviewed   Diet Order:   Diet Order             Diet NPO time specified  Diet effective midnight           Diet Carb Modified Fluid consistency: Thin; Room service appropriate? Yes  Diet effective now                  EDUCATION NEEDS:   Education needs have been addressed  Skin:  Skin Assessment: Reviewed RN Assessment (incision R foot)  Last BM:  pta  Height:   Ht Readings from Last 1 Encounters:  03/09/21 6' (1.829 m)    Weight:   Wt Readings from Last 1 Encounters:  03/09/21 86.2 kg    Ideal Body Weight:  80.9 kg  BMI:  Body mass index is 25.77 kg/m.  Estimated Nutritional Needs:   Kcal:  2200-2400kcal/day  Protein:  110-125g/day  Fluid:  2.2-2.5L/day  CKoleen DistanceMS, RD, LDN Please refer to [facility] RD and/or RD on-call/weekend/after  hours pager

## 2021-03-10 NOTE — Progress Notes (Signed)
Inpatient Rehab Admissions Coordinator :  Per therapy recommendations, patient was screened for CIR candidacy by Ottie Glazier RN MSN.  [employer] Medicare payor trends unlikely to approve CIR level rehab for amputation. I will place order and we can pursue approval when surgical procedures complete, but wanted to clarify that approval trends may be a barrier to CIR level admit.  I will place a rehab consult per protocol for full assessment. Please call me with any questions.  Ottie Glazier RN MSN Admissions Coordinator [phone]

## 2021-03-10 NOTE — TOC CM/SW Note (Signed)
CSW acknowledges consult for HH/SNF. In comments: "Patient is status post guillotine amputation due to severe sepsis.  Will be returning to the operating room in a few days for completion above-the-knee amputation." PT and OT were ordered yesterday. Will follow for their recommendations and progression during hospital stay.  Charlynn Court, CSW [phone]

## 2021-03-10 NOTE — Evaluation (Signed)
Physical Therapy Evaluation Patient Details Name: [name] [name] MRN: [id number] DOB: 1940/12/18 Today's Date: 03/10/2021  History of Present Illness  [name] [name] is a 80yoM who comes to [facility] on11/15 presents with sepsis from an infected ulceration of the right foot and ankle. 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 PTA pt was mostly homebound, trying to stay off his foot wound, using RW.  Clinical Impression  Pt admitted with above diagnosis. Pt currently with functional limitations due to the deficits listed below (see "PT Problem List"). Patient agreeable to PT evaluation. Patient provides detailed description of PLOF and home environment. Pre-entry Hb: 6.8, vitals monitored throughout, albeit pt denies dizziness or palpitations; pt has not orthostasis, no tachycardia in session. SpO2 WNL on room air. Bed mobility with supervision only due to ABLA and first time since OR. MaxA STS, MaxA SPT to recliner, not able to rise with modA and RW. Pt tolerates session well, no frank fatigue, VSS, no impulsivity, very positive throughout session regarding his near future and rehab/heal process. Patient's assessment this date reveals the patient requires an additional person present for safety and/or physical assistance to complete their typical ADL. At baseline, the patient is able to perform ADL with modified independence. Patient will benefit from skilled PT intervention to maximize independence and safety in mobility required for basic ADL performance at discharge.          Recommendations for follow up therapy are one component of a multi-disciplinary discharge planning process, led by the attending physician.  Recommendations may be updated based on patient status, additional functional criteria and insurance authorization.  Follow Up Recommendations Acute inpatient rehab (3hours/day)     Assistance Recommended at Discharge Set up Supervision/Assistance  Functional Status Assessment Patient has had a recent decline in their functional status and demonstrates the ability to make significant improvements in function in a reasonable and predictable amount of time.  Equipment Recommendations  None recommended by PT    Recommendations for Other Services       Precautions / Restrictions Restrictions Weight Bearing Restrictions: No (Implied RLE NWB, acute amputation wound.)      Mobility  Bed Mobility Overal bed mobility: Needs Assistance Bed Mobility: Supine to Sit     Supine to sit: Supervision     General bed mobility comments: very weak, but able. No dizziness, no LOB.    Transfers Overall transfer level: Needs assistance   Transfers: Sit to/from Stand;Bed to chair/wheelchair/BSC Sit to Stand: Max assist Stand pivot transfers: Max assist         General transfer comment: unable to rise with RW    Ambulation/Gait Ambulation/Gait assistance:  (unable, inappropriate given weakness and Hb 6.8)                Stairs            Wheelchair Mobility    Modified Rankin (Stroke Patients Only)       Balance Overall balance assessment: Mild deficits observed, not formally tested                                           Pertinent Vitals/Pain Pain Assessment: No/denies pain (intermittent sharp pain in limb, otherwise no pain at rest)    Home Living Family/patient expects to be  discharged to:: Private residence Living Arrangements: Spouse/significant other;Children;Other relatives (Wife, Son, 2 teenage grandDTR) Available Help at Discharge: Family Type of Home: House Home Access: Ramped entrance       Home Layout: One level Home Equipment: Agricultural consultant (2 wheels) Additional Comments: was using his brother's walker, mostly homebound last couple months trying to keep weight off foot; Pt says he was sleeping on the  couch for seevral months because "it's a long way down the hall to my bed."    Prior Function Prior Level of Function : History of Falls (last six months);Independent/Modified Independent (1 fall in garden in June)             Mobility Comments: was using his brother's walker, mostly homebound last couple months trying to keep weight off foot ADLs Comments: independent baseline     Hand Dominance        Extremity/Trunk Assessment        Lower Extremity Assessment Lower Extremity Assessment: RLE deficits/detail RLE Deficits / Details: clean, acute amputation wound, dressing adn NPT intact, minimal visualized drainage in tubing. No frank distal limb edema.       Communication      Cognition Arousal/Alertness: Awake/alert Behavior During Therapy: WFL for tasks assessed/performed Overall Cognitive Status: Within Functional Limits for tasks assessed                                          General Comments      Exercises     Assessment/Plan    PT Assessment Patient needs continued PT services  PT Problem List Decreased strength;Decreased activity tolerance;Decreased balance;Decreased mobility;Decreased knowledge of use of DME;Decreased safety awareness;Decreased knowledge of precautions;Cardiopulmonary status limiting activity       PT Treatment Interventions Gait training;DME instruction;Balance training;Functional mobility training;Therapeutic activities;Therapeutic exercise;Patient/family education    PT Goals (Current goals can be found in the Care Plan section)  Acute Rehab PT Goals Patient Stated Goal: stay postiive, not burden his family PT Goal Formulation: With patient Time For Goal Achievement: 03/24/21 Potential to Achieve Goals: Good    Frequency 7X/week   Barriers to discharge Inaccessible home environment Pt is nonambulatory at present    Co-evaluation               AM-PAC PT "6 Clicks" Mobility  Outcome Measure Help  needed turning from your back to your side while in a flat bed without using bedrails?: A Little Help needed moving from lying on your back to sitting on the side of a flat bed without using bedrails?: A Little Help needed moving to and from a bed to a chair (including a wheelchair)?: Total Help needed standing up from a chair using your arms (e.g., wheelchair or bedside chair)?: Total Help needed to walk in hospital room?: Total Help needed climbing 3-5 steps with a railing? : Total 6 Click Score: 10    End of Session   Activity Tolerance: Patient tolerated treatment well;Patient limited by fatigue Patient left: in chair;with family/visitor present;with call bell/phone within reach Nurse Communication: Mobility status PT Visit Diagnosis: Unsteadiness on feet (R26.81);Other abnormalities of gait and mobility (R26.89);Difficulty in walking, not elsewhere classified (R26.2);Muscle weakness (generalized) (M62.81)    Time: 1000-1045 PT Time Calculation (min) (ACUTE ONLY): 45 min   Charges:   PT Evaluation $PT Eval Moderate Complexity: 1 Mod PT Treatments $Therapeutic Activity: 8-22 mins  11:08 AM, 03/10/21 Rosamaria Lints, PT, DPT Physical Therapist - [facility]  [phone] (ASCOM)    Cindra Austad C 03/10/2021, 11:05 AM

## 2021-03-10 NOTE — Evaluation (Signed)
Occupational Therapy Evaluation Patient Details Name: [name] [name] MRN: [id number] DOB: 03-Nov-1940 Today's Date: 03/10/2021   History of Present Illness Altin Sease is a 80yoM who comes to [facility] on11/15 presents with sepsis from an infected ulceration of the right foot and ankle. 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 PTA pt was mostly homebound, trying to stay off his foot wound, using RW.   Clinical Impression   Mr Frappier was seen for OT evaluation this date. Prior to hospital admission, pt was MOD I for mobility using his brothers RW. Pt lives with family in home c ramped entrance. Pt presents to acute OT demonstrating impaired ADL performance and functional mobility 2/2 decreased activity tolerance and functional strength/ROM/balance deficits. RN cleared OT to see pt with blood transfusion running - pt denies dizziness with mobility. Pt currently requires SETUP + SUPERVISION don L sock seated EOB using LUE only - requires RUE for support. Anticipate MAX A for standing portion of dressing/grooming as pt reliant on BUE for support. MAX A + RW for sit<>stand x2. Pt would benefit from skilled OT to address noted impairments and functional limitations (see below for any additional details) in order to maximize safety and independence while minimizing falls risk and caregiver burden. Upon hospital discharge, recommend AIR to maximize pt safety and return to PLOF.       Recommendations for follow up therapy are one component of a multi-disciplinary discharge planning process, led by the attending physician.  Recommendations may be updated based on patient status, additional functional criteria and insurance authorization.   Follow Up Recommendations  Acute inpatient rehab (3hours/day)    Assistance Recommended at Discharge Intermittent Supervision/Assistance  Functional Status Assessment   Patient has had a recent decline in their functional status and demonstrates the ability to make significant improvements in function in a reasonable and predictable amount of time.  Equipment Recommendations  Other (comment) (defer to next venue of care)    Recommendations for Other Services       Precautions / Restrictions Precautions Precautions: Fall Restrictions Weight Bearing Restrictions: Yes RLE Weight Bearing: Non weight bearing Other Position/Activity Restrictions: per secure chat from vascular      Mobility Bed Mobility               General bed mobility comments: pt received and left in chair    Transfers Overall transfer level: Needs assistance Equipment used: Rolling walker (2 wheels) Transfers: Sit to/from Stand Sit to Stand: Max assist           General transfer comment: cues for RW technique      Balance Overall balance assessment: Needs assistance Sitting-balance support: Single extremity supported;Feet supported Sitting balance-Leahy Scale: Fair     Standing balance support: Bilateral upper extremity supported Standing balance-Leahy Scale: Poor                             ADL either performed or assessed with clinical judgement   ADL Overall ADL's : Needs assistance/impaired                                       General ADL Comments: SETUP + SUPERVISION don L sock seated EOB using LUE only - requires RUE for support. MAX  A + RW for ADL t/f.      Pertinent Vitals/Pain Pain Assessment: Faces Faces Pain Scale: Hurts little more Pain Location: RLE at amputation site Pain Descriptors / Indicators: Discomfort;Dull;Grimacing Pain Intervention(s): Limited activity within patient's tolerance;Repositioned     Hand Dominance Right   Extremity/Trunk Assessment Upper Extremity Assessment Upper Extremity Assessment: Generalized weakness   Lower Extremity Assessment Lower Extremity Assessment: RLE  deficits/detail RLE Deficits / Details: C/D/I dressing       Communication Communication Communication: HOH   Cognition Arousal/Alertness: Awake/alert Behavior During Therapy: WFL for tasks assessed/performed Overall Cognitive Status: Within Functional Limits for tasks assessed                                 General Comments: question pt's hearing - reports neighbours house setup     General Comments  pt receiving blood, RN approved session    Exercises Exercises: Other exercises Other Exercises Other Exercises: Pt educated re: OT role, DME recs, d/c recs, falls prevention, ECS Other Exercises: LBD, sit<>stand x2, sitting/standing balance/tolerance   Shoulder Instructions      Home Living Family/patient expects to be discharged to:: Private residence Living Arrangements: Spouse/significant other;Children;Other relatives (Wife, Son, 2 teenage granddaughters) Available Help at Discharge: Family Type of Home: House Home Access: Ramped entrance     Home Layout: One level     Bathroom Shower/Tub: Tub/shower unit         Home Equipment: Agricultural consultant (2 wheels)   Additional Comments: was using his brother's walker, mostly homebound last couple months trying to keep weight off foot; Pt says he was sleeping on the couch for last ~2 weeks due to fatigue      Prior Functioning/Environment Prior Level of Function : Independent/Modified Independent             Mobility Comments: was using his brother's walker, mostly homebound last couple months trying to keep weight off foot. reports 1 fall in June ADLs Comments: Independent including gardening ~2 acre gardeb        OT Problem List: Decreased strength;Decreased range of motion;Impaired balance (sitting and/or standing);Decreased activity tolerance;Decreased safety awareness      OT Treatment/Interventions: Self-care/ADL training;Therapeutic exercise;Energy conservation;DME and/or AE  instruction;Therapeutic activities;Patient/family education;Balance training    OT Goals(Current goals can be found in the care plan section) Acute Rehab OT Goals Patient Stated Goal: to return to PLOF OT Goal Formulation: With patient Time For Goal Achievement: 03/24/21 Potential to Achieve Goals: Good ADL Goals Pt Will Perform Grooming: (P) with min assist;standing (c LRAD PRN) Pt Will Perform Lower Body Dressing: (P) sit to/from stand;with set-up;with supervision (c LRAD PRN) Pt Will Transfer to Toilet: (P) with min assist;stand pivot transfer;bedside commode (c LRAD PRN)  OT Frequency: Min 3X/week    AM-PAC OT "6 Clicks" Daily Activity     Outcome Measure Help from another person eating meals?: None Help from another person taking care of personal grooming?: A Lot Help from another person toileting, which includes using toliet, bedpan, or urinal?: A Lot Help from another person bathing (including washing, rinsing, drying)?: A Lot Help from another person to put on and taking off regular upper body clothing?: None Help from another person to put on and taking off regular lower body clothing?: A Lot 6 Click Score: 16   End of Session Equipment Utilized During Treatment: Rolling walker (2 wheels);Gait belt Nurse Communication: Mobility status  Activity Tolerance: Patient  tolerated treatment well Patient left: in chair;with call bell/phone within reach  OT Visit Diagnosis: Other abnormalities of gait and mobility (R26.89);Muscle weakness (generalized) (M62.81)                Time: 2637-8588 OT Time Calculation (min): 13 min Charges:  OT General Charges $OT Visit: 1 Visit OT Evaluation $OT Eval Moderate Complexity: 1 Mod  Kathie Dike, M.S. OTR/L  03/10/21, 4:28 PM  ascom [phone]

## 2021-03-10 NOTE — H&P (View-Only) (Signed)
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  States pain has improved status post guillotine amputation of the right foot.  Objective: Vitals:   03/09/21 2002 03/10/21 0432 03/10/21 0748 03/10/21 1036  BP: 94/61 (!) 85/46 97/62 111/65  Pulse: 74 88 73 73  Resp: 18 18 18    Temp: 97.6 F (36.4 C) 97.9 F (36.6 C) 97.8 F (36.6 C)   TempSrc:  Oral Oral   SpO2: 99% 98% 100% 100%  Weight:      Height:        Intake/Output Summary (Last 24 hours) at 03/10/2021 1134 Last data filed at 03/10/2021 1031 Gross per 24 hour  Intake 1581.86 ml  Output 550 ml  Net 1031.86 ml   Physical Exam: A&Ox3, NAD CV: RRR Pulmonary: CTA Bilaterally Abdomen: Soft, Nontender, Nondistended Vascular:  Right lower extremity: Thigh soft.  Calf soft.  Extremities warm distally.  VAC is intact and to suction.   Laboratory: CBC    Component Value Date/Time   WBC 20.0 (H) 03/10/2021 0446   HGB 6.8 (L) 03/10/2021 0446   HGB 14.7 02/24/2012 0325   HCT 19.6 (L) 03/10/2021 0446   HCT 44.4 02/24/2012 0325   PLT 248 03/10/2021 0446   PLT 108 (L) 02/24/2012 0325   BMET    Component Value Date/Time   NA 137 03/10/2021 0446   NA 143 02/24/2012 0325   K 4.2 03/10/2021 0446   K 4.1 02/24/2012 0325   CL 106 03/10/2021 0446   CL 108 (H) 02/24/2012 0325   CO2 25 03/10/2021 0446   CO2 26 02/24/2012 0325   GLUCOSE 141 (H) 03/10/2021 0446   GLUCOSE 120 (H) 02/24/2012 0325   BUN 23 03/10/2021 0446   BUN 25 (H) 02/24/2012 0325   CREATININE 1.31 (H) 03/10/2021 0446   CREATININE 1.47 (H) 02/24/2012 0325   CALCIUM 7.7 (L) 03/10/2021 0446   CALCIUM 9.1 02/24/2012 0325   GFRNONAA 55 (L) 03/10/2021 0446   GFRNONAA 48 (L) 02/24/2012 0325   GFRAA 55 (L) 02/24/2012 0325   Assessment/Planning: The patient is a 80 year old male who presented to the [facility]  emergency department with a progressively worsening wound/septic joint now status post guillotine amputation of the right foot - POD#1  1) Patient with improvement to discomfort now status post guillotine. 2) VAC is intact and to suction draining serosanguineous fluid. 3) We will plan on returning to the operating room tomorrow for a above-the-knee amputation. 4) Hemoglobin 6.8.  Will transfuse 1 unit packed red blood cells. 5) NPO after midnight.  Discussed with Dr. [facility] [place] PA-C 03/10/2021 11:34 AM

## 2021-03-10 NOTE — Progress Notes (Addendum)
[facility]  PROGRESS NOTE  [name] [name] [id number] DOB: 06-Aug-1940 DOA: 03/08/2021 PCP: Toni Arthurs, NP   Brief HPI:   80 year old male with history of hypertension, diabetes mellitus type 2, atrial fibrillation on Xarelto, CHF, hyperlipidemia came to the ED with complaints of worsening pain from chronic wound of right ankle.  In the ED right ankle x-ray showed suspicion for osteomyelitis.  Intra-articular gas at the anterior tibiotalar joint concerning for delayed complication/septic arthritis.  Patient was started on vancomycin and cefepime.  Vascular surgery was consulted.    Subjective   Patient underwent guillotine amputation of right lower extremity.  Pain well controlled.   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 -Plan for above-knee potation in a.m.  Microcytic anemia -Hemoglobin is down to 6.8 -1 unit PRBC ordered -Follow CBC in a.m.  Lactic acidosis -Resolved  Diabetes mellitus type 2 -Continue sliding scale insulin NovoLog -CBG fairly well controlled  History of CHF -Unknown diastolic versus systolic -Euvolemic -Continue Coreg, statin, aspirin -Lasix on hold due to hypotension  Hypotension -Patient developed hypotension this morning -Hold Lasix, ACE inhibitor -Continue Coreg  Chronic atrial fibrillation -Continue Coreg, Xarelto  Hyperlipidemia -Continue statin         Medications     vitamin C  500 mg Oral BID   atorvastatin  10 mg Oral q1800   carvedilol  6.25 mg Oral BID   digoxin  0.125 mg Oral Daily   furosemide  20 mg Intravenous Once   gabapentin  100 mg Oral TID   insulin aspart  0-5 Units Subcutaneous QHS   insulin aspart  0-9 Units Subcutaneous TID WC   [START ON 03/11/2021] multivitamin-lutein  1 capsule Oral Daily   Ensure Max Protein  11 oz Oral TID    vitamin B-12  1,000 mcg Oral Daily     Data Reviewed:   CBG:  Recent Labs  Lab 03/09/21 1556 03/09/21 2113 03/10/21 0749 03/10/21 1131 03/10/21 1640  GLUCAP 190* 172* 119* 203* 162*    SpO2: 96 % O2 Flow Rate (L/min): 0 L/min    Vitals:   03/10/21 0748 03/10/21 1036 03/10/21 1414 03/10/21 1436  BP: 97/62 111/65 109/75 112/80  Pulse: 73 73 (!) 59   Resp: 18  18 18   Temp: 97.8 F (36.6 C)  (!) 97.4 F (36.3 C) 97.7 F (36.5 C)  TempSrc: Oral  Oral Oral  SpO2: 100% 100% 96%   Weight:      Height:         Intake/Output Summary (Last 24 hours) at 03/10/2021 1708 Last data filed at 03/10/2021 1419 Gross per 24 hour  Intake 1821.86 ml  Output 550 ml  Net 1271.86 ml    11/15 1901 - 11/17 0700 In: 3341.9 [P.O.:120; I.V.:2925] Out: 100 [Drains:50]  Filed Weights   03/08/21 1221 03/09/21 0949  Weight: 86.2 kg 86.2 kg    Data Reviewed: Basic Metabolic Panel: Recent Labs  Lab 03/08/21 1235 03/09/21 0611 03/10/21 0446  NA 134* 135 137  K 4.0 4.0 4.2  CL 98 101 106  CO2 27 27 25   GLUCOSE 172* 78 141*  BUN 21 16 23   CREATININE 1.32* 1.15 1.31*  CALCIUM 8.7* 8.6* 7.7*  MG  --  1.8 2.1  PHOS  --  2.7  --    Liver Function Tests: Recent Labs  Lab 03/08/21  1235 03/09/21 0611  AST 34 32  ALT 19 15  ALKPHOS 110 113  BILITOT 0.6 0.9  PROT 7.6 6.8  ALBUMIN 2.2* 2.1*   No results for input(s): LIPASE, AMYLASE in the last 168 hours. No results for input(s): AMMONIA in the last 168 hours. CBC: Recent Labs  Lab 03/08/21 1235 03/09/21 0611 03/10/21 0446  WBC 20.3* 26.6* 20.0*  NEUTROABS 16.4*  --   --   HGB 9.4* 8.2* 6.8*  HCT 27.5* 23.5* 19.6*  MCV 81.1 78.3* 80.0  PLT 311 244 248   Cardiac Enzymes: No results for input(s): CKTOTAL, CKMB, CKMBINDEX, TROPONINI in the last 168 hours. BNP (last 3 results) No results for input(s): BNP in the last 8760 hours.  ProBNP (last 3 results) No results for input(s): PROBNP in the last 8760  hours.  CBG: Recent Labs  Lab 03/09/21 1556 03/09/21 2113 03/10/21 0749 03/10/21 1131 03/10/21 1640  GLUCAP 190* 172* 119* 203* 162*       Radiology Reports  No results found.     Antibiotics: Anti-infectives (From admission, onward)    Start     Dose/Rate Route Frequency Ordered Stop   03/10/21 1500  vancomycin (VANCOREADY) IVPB 1500 mg/300 mL        1,500 mg 150 mL/hr over 120 Minutes Intravenous Every 24 hours 03/09/21 1840     03/09/21 1500  vancomycin (VANCOREADY) IVPB 1250 mg/250 mL  Status:  Discontinued        1,250 mg 166.7 mL/hr over 90 Minutes Intravenous Every 24 hours 03/08/21 1910 03/09/21 1840   03/09/21 0000  ceFEPIme (MAXIPIME) 2 g in sodium chloride 0.9 % 100 mL IVPB        2 g 200 mL/hr over 30 Minutes Intravenous Every 12 hours 03/08/21 1910     03/08/21 1500  vancomycin (VANCOCIN) IVPB 1000 mg/200 mL premix       See Hyperspace for full Linked Orders Report.   1,000 mg 200 mL/hr over 60 Minutes Intravenous  Once 03/08/21 1331 03/08/21 1719   03/08/21 1400  vancomycin (VANCOCIN) IVPB 1000 mg/200 mL premix       See Hyperspace for full Linked Orders Report.   1,000 mg 200 mL/hr over 60 Minutes Intravenous  Once 03/08/21 1331 03/08/21 1607   03/08/21 1345  ceFEPIme (MAXIPIME) 2 g in sodium chloride 0.9 % 100 mL IVPB        2 g 200 mL/hr over 30 Minutes Intravenous  Once 03/08/21 1331 03/08/21 1457         DVT prophylaxis: SCDs  Code Status: Full code  Family Communication: No family at bedside   Consultants: Vascular surgery  Procedures:     Objective    Physical Examination:  General-appears in no acute distress Heart-S1-S2, regular, no murmur auscultated Lungs-clear to auscultation bilaterally, no wheezing or crackles auscultated Abdomen-soft, nontender, no organomegaly Extremities-no edema in the lower extremities Neuro-alert, oriented x3, no focal deficit noted  Status is: Inpatient  Dispo: The patient is from:                Anticipated d/c is to: Skilled nursing facility              Anticipated d/c date is: 03/13/2021              Patient currently not stable for discharge  Barrier to discharge-awaiting above-the-knee potation  COVID-19 Labs  Recent Labs    03/08/21 1405 03/10/21 0446  FERRITIN  --  416*  CRP 25.0*  --     Lab Results  Component Value Date   SARSCOV2NAA NEGATIVE 03/08/2021            Recent Results (from the past 240 hour(s))  Culture, blood (Routine x 2)     Status: None (Preliminary result)   Collection Time: 03/08/21 12:35 PM   Specimen: BLOOD  Result Value Ref Range Status   Specimen Description BLOOD BLOOD RIGHT FOREARM  Final   Special Requests   Final    BOTTLES DRAWN AEROBIC AND ANAEROBIC Blood Culture adequate volume   Culture   Final    NO GROWTH 2 DAYS Performed at [facility], [address], [place], [place] [address]    Report Status PENDING  Incomplete  Culture, blood (Routine x 2)     Status: None (Preliminary result)   Collection Time: 03/08/21  2:04 PM   Specimen: BLOOD  Result Value Ref Range Status   Specimen Description BLOOD left arm  Final   Special Requests   Final    BOTTLES DRAWN AEROBIC AND ANAEROBIC Blood Culture results may not be optimal due to an inadequate volume of blood received in culture bottles   Culture   Final    NO GROWTH 2 DAYS Performed at [facility], [address] [place], [place] [address]    Report Status PENDING  Incomplete  Resp Panel by RT-PCR (Flu A&B, Covid) Nasopharyngeal Swab     Status: None   Collection Time: 03/08/21  2:05 PM   Specimen: Nasopharyngeal Swab; Nasopharyngeal(NP) swabs in vial transport medium  Result Value Ref Range Status   SARS Coronavirus 2 by RT PCR NEGATIVE NEGATIVE Final    Comment: (NOTE) SARS-CoV-2 target nucleic acids are NOT DETECTED.  The SARS-CoV-2 RNA is generally detectable in upper respiratory specimens during the acute phase of  infection. The lowest concentration of SARS-CoV-2 viral copies this assay can detect is 138 copies/mL. A negative result does not preclude SARS-Cov-2 infection and should not be used as the sole basis for treatment or other patient management decisions. A negative result may occur with  improper specimen collection/handling, submission of specimen other than nasopharyngeal swab, presence of viral mutation(s) within the areas targeted by this assay, and inadequate number of viral copies(<138 copies/mL). A negative result must be combined with clinical observations, patient history, and epidemiological information. The expected result is Negative.  Fact Sheet for Patients:  BloggerCourse.com  Fact Sheet for Healthcare Providers:  SeriousBroker.it  This test is no t yet approved or cleared by the [place] FDA and  has been authorized for detection and/or diagnosis of SARS-CoV-2 by FDA under an Emergency Use Authorization (EUA). This EUA will remain  in effect (meaning this test can be used) for the duration of the COVID-19 declaration under Section 564(b)(1) of the Act, 21 U.S.C.section 360bbb-3(b)(1), unless the authorization is terminated  or revoked sooner.       Influenza A by PCR NEGATIVE NEGATIVE Final   Influenza B by PCR NEGATIVE NEGATIVE Final    Comment: (NOTE) The Xpert Xpress SARS-CoV-2/FLU/RSV plus assay is intended as an aid in the diagnosis of influenza from Nasopharyngeal swab specimens and should not be used as a sole basis for treatment. Nasal washings and aspirates are unacceptable for Xpert Xpress SARS-CoV-2/FLU/RSV testing.  Fact Sheet for Patients: BloggerCourse.com  Fact Sheet for Healthcare Providers: SeriousBroker.it  This test is not yet approved or cleared by the [place] FDA and has been authorized for detection  and/or diagnosis of SARS-CoV-2  by FDA under an Emergency Use Authorization (EUA). This EUA will remain in effect (meaning this test can be used) for the duration of the COVID-19 declaration under Section 564(b)(1) of the Act, 21 U.S.C. section 360bbb-3(b)(1), unless the authorization is terminated or revoked.  Performed at [facility], [address] [place], [place] [address]     [facility] If 7PM-7AM, please contact night-coverage at [email or web address], Office  [phone]   03/10/2021, 5:08 PM  LOS: 2 days

## 2021-03-11 ENCOUNTER — Inpatient Hospital Stay: Payer: Medicare HMO | Admitting: Anesthesiology

## 2021-03-11 ENCOUNTER — Encounter: Payer: Medicare HMO | Admitting: Physician Assistant

## 2021-03-11 ENCOUNTER — Encounter
Admission: EM | Disposition: A | Discharge status: Skilled Nursing Facility | Payer: Self-pay | Source: Home / Self Care | Attending: Internal Medicine

## 2021-03-11 ENCOUNTER — Encounter: Payer: Self-pay | Admitting: Internal Medicine

## 2021-03-11 DIAGNOSIS — E440 Moderate protein-calorie malnutrition: Secondary | ICD-10-CM

## 2021-03-11 DIAGNOSIS — M86171 Other acute osteomyelitis, right ankle and foot: Secondary | ICD-10-CM | POA: Diagnosis not present

## 2021-03-11 DIAGNOSIS — I96 Gangrene, not elsewhere classified: Secondary | ICD-10-CM

## 2021-03-11 DIAGNOSIS — D62 Acute posthemorrhagic anemia: Secondary | ICD-10-CM | POA: Diagnosis not present

## 2021-03-11 DIAGNOSIS — E1169 Type 2 diabetes mellitus with other specified complication: Secondary | ICD-10-CM

## 2021-03-11 HISTORY — PX: AMPUTATION: SHX166

## 2021-03-11 LAB — CBC
HCT: 25.6 % — ABNORMAL LOW (ref 39.0–52.0)
Hemoglobin: 8.6 g/dL — ABNORMAL LOW (ref 13.0–17.0)
MCH: 26.7 pg (ref 26.0–34.0)
MCHC: 33.6 g/dL (ref 30.0–36.0)
MCV: 79.5 fL — ABNORMAL LOW (ref 80.0–100.0)
Platelets: 281 10*3/uL (ref 150–400)
RBC: 3.22 MIL/uL — ABNORMAL LOW (ref 4.22–5.81)
RDW: 15.9 % — ABNORMAL HIGH (ref 11.5–15.5)
WBC: 19.5 10*3/uL — ABNORMAL HIGH (ref 4.0–10.5)
nRBC: 0.1 % (ref 0.0–0.2)

## 2021-03-11 LAB — BASIC METABOLIC PANEL
Anion gap: 5 (ref 5–15)
BUN: 33 mg/dL — ABNORMAL HIGH (ref 8–23)
CO2: 25 mmol/L (ref 22–32)
Calcium: 7.9 mg/dL — ABNORMAL LOW (ref 8.9–10.3)
Chloride: 106 mmol/L (ref 98–111)
Creatinine, Ser: 1.28 mg/dL — ABNORMAL HIGH (ref 0.61–1.24)
GFR, Estimated: 57 mL/min — ABNORMAL LOW (ref >60)
Glucose, Bld: 107 mg/dL — ABNORMAL HIGH (ref 70–99)
Potassium: 4.3 mmol/L (ref 3.5–5.1)
Sodium: 136 mmol/L (ref 135–145)

## 2021-03-11 LAB — URINALYSIS, COMPLETE (UACMP) WITH MICROSCOPIC
Bacteria, UA: NONE SEEN
Bilirubin Urine: NEGATIVE
Glucose, UA: NEGATIVE mg/dL
Ketones, ur: NEGATIVE mg/dL
Leukocytes,Ua: NEGATIVE
Nitrite: NEGATIVE
Protein, ur: NEGATIVE mg/dL
Specific Gravity, Urine: 1.01 (ref 1.005–1.030)
Squamous Epithelial / HPF: NONE SEEN (ref 0–5)
pH: 5 (ref 5.0–8.0)

## 2021-03-11 LAB — GLUCOSE, CAPILLARY
Glucose-Capillary: 97 mg/dL (ref 70–99)
Glucose-Capillary: 95 mg/dL (ref 70–99)
Glucose-Capillary: 96 mg/dL (ref 70–99)
Glucose-Capillary: 148 mg/dL — ABNORMAL HIGH (ref 70–99)

## 2021-03-11 LAB — LACTIC ACID, PLASMA: Lactic Acid, Venous: 0.8 mmol/L (ref 0.5–1.9)

## 2021-03-11 LAB — PROTIME-INR
INR: 1.3 — ABNORMAL HIGH (ref 0.8–1.2)
Prothrombin Time: 16.6 seconds — ABNORMAL HIGH (ref 11.4–15.2)

## 2021-03-11 LAB — APTT: aPTT: 49 seconds — ABNORMAL HIGH (ref 24–36)

## 2021-03-11 LAB — MAGNESIUM: Magnesium: 2.3 mg/dL (ref 1.7–2.4)

## 2021-03-11 SURGERY — AMPUTATION, ABOVE KNEE
Anesthesia: General | Site: Leg Upper | Laterality: Right

## 2021-03-11 MED ORDER — ROCURONIUM BROMIDE 100 MG/10ML IV SOLN
INTRAVENOUS | Status: DC | PRN
  Administered 2021-03-11: 50 mg via INTRAVENOUS

## 2021-03-11 MED ORDER — HYDRALAZINE HCL 20 MG/ML IJ SOLN
20.0000 mg | INTRAMUSCULAR | Status: AC
  Administered 2021-03-11: 20 mg via INTRAVENOUS
  Filled 2021-03-11: qty 1

## 2021-03-11 MED ORDER — PROPOFOL 10 MG/ML IV BOLUS
INTRAVENOUS | Status: AC
  Filled 2021-03-11: qty 20

## 2021-03-11 MED ORDER — FENTANYL CITRATE (PF) 100 MCG/2ML IJ SOLN
INTRAMUSCULAR | Status: AC
  Administered 2021-03-11: 25 ug via INTRAVENOUS
  Filled 2021-03-11: qty 2

## 2021-03-11 MED ORDER — SUGAMMADEX SODIUM 200 MG/2ML IV SOLN
INTRAVENOUS | Status: DC | PRN
  Administered 2021-03-11: 500 mg via INTRAVENOUS

## 2021-03-11 MED ORDER — LIDOCAINE HCL (CARDIAC) PF 100 MG/5ML IV SOSY
PREFILLED_SYRINGE | INTRAVENOUS | Status: DC | PRN
  Administered 2021-03-11: 60 mg via INTRAVENOUS

## 2021-03-11 MED ORDER — FENTANYL CITRATE (PF) 100 MCG/2ML IJ SOLN
INTRAMUSCULAR | Status: AC
  Filled 2021-03-11: qty 2

## 2021-03-11 MED ORDER — PROPOFOL 10 MG/ML IV BOLUS
INTRAVENOUS | Status: DC | PRN
  Administered 2021-03-11: 60 mg via INTRAVENOUS
  Administered 2021-03-11: 100 mg via INTRAVENOUS

## 2021-03-11 MED ORDER — ONDANSETRON HCL 4 MG/2ML IJ SOLN
INTRAMUSCULAR | Status: AC
  Filled 2021-03-11: qty 2

## 2021-03-11 MED ORDER — FENTANYL CITRATE (PF) 100 MCG/2ML IJ SOLN
25.0000 ug | INTRAMUSCULAR | Status: DC | PRN
  Administered 2021-03-11 (×2): 25 ug via INTRAVENOUS

## 2021-03-11 MED ORDER — FENTANYL CITRATE (PF) 100 MCG/2ML IJ SOLN
INTRAMUSCULAR | Status: AC
  Administered 2021-03-11: 50 ug via INTRAVENOUS
  Filled 2021-03-11: qty 2

## 2021-03-11 MED ORDER — FERROUS GLUCONATE 324 (38 FE) MG PO TABS
324.0000 mg | ORAL_TABLET | Freq: Two times a day (BID) | ORAL | Status: DC
  Administered 2021-03-12 – 2021-03-16 (×9): 324 mg via ORAL
  Filled 2021-03-11 (×13): qty 1

## 2021-03-11 MED ORDER — FENTANYL CITRATE (PF) 100 MCG/2ML IJ SOLN
INTRAMUSCULAR | Status: DC | PRN
  Administered 2021-03-11 (×2): 50 ug via INTRAVENOUS

## 2021-03-11 MED ORDER — SODIUM CHLORIDE 0.9 % IR SOLN
Status: DC | PRN
  Administered 2021-03-11 (×2): 500 mL

## 2021-03-11 MED ORDER — CEFAZOLIN SODIUM-DEXTROSE 1-4 GM/50ML-% IV SOLN
INTRAVENOUS | Status: DC | PRN
  Administered 2021-03-11: 2 g via INTRAVENOUS

## 2021-03-11 MED ORDER — PHENYLEPHRINE HCL-NACL 20-0.9 MG/250ML-% IV SOLN
INTRAVENOUS | Status: DC | PRN
  Administered 2021-03-11: 50 ug/min via INTRAVENOUS

## 2021-03-11 SURGICAL SUPPLY — 42 items
BLADE SAGITTAL WIDE XTHICK NO (BLADE) ×2 IMPLANT
BNDG COHESIVE 4X5 TAN ST LF (GAUZE/BANDAGES/DRESSINGS) ×2 IMPLANT
BNDG ELASTIC 6X5.8 VLCR NS LF (GAUZE/BANDAGES/DRESSINGS) ×2 IMPLANT
BNDG GAUZE ELAST 4 BULKY (GAUZE/BANDAGES/DRESSINGS) ×4 IMPLANT
BRUSH SCRUB EZ  4% CHG (MISCELLANEOUS) ×1
BRUSH SCRUB EZ 4% CHG (MISCELLANEOUS) ×1 IMPLANT
CHLORAPREP W/TINT 26 (MISCELLANEOUS) ×1 IMPLANT
DRAPE INCISE IOBAN 66X45 STRL (DRAPES) ×2 IMPLANT
DRAPE INCISE IOBAN 66X60 STRL (DRAPES) ×1 IMPLANT
ELECT CAUTERY BLADE 6.4 (BLADE) ×2 IMPLANT
ELECT REM PT RETURN 9FT ADLT (ELECTROSURGICAL) ×2
ELECTRODE REM PT RTRN 9FT ADLT (ELECTROSURGICAL) ×1 IMPLANT
GAUZE 4X4 16PLY ~~LOC~~+RFID DBL (SPONGE) ×2 IMPLANT
GAUZE SPONGE 4X4 12PLY STRL (GAUZE/BANDAGES/DRESSINGS) ×1 IMPLANT
GAUZE XEROFORM 1X8 LF (GAUZE/BANDAGES/DRESSINGS) ×4 IMPLANT
GLOVE SURG ENC MOIS LTX SZ7 (GLOVE) ×2 IMPLANT
GLOVE SURG SYN 7.0 (GLOVE) ×2 IMPLANT
GLOVE SURG SYN 7.0 PF PI (GLOVE) ×1 IMPLANT
GLOVE SURG UNDER LTX SZ7.5 (GLOVE) ×2 IMPLANT
GOWN STRL REUS W/ TWL LRG LVL3 (GOWN DISPOSABLE) ×1 IMPLANT
GOWN STRL REUS W/ TWL XL LVL3 (GOWN DISPOSABLE) ×2 IMPLANT
GOWN STRL REUS W/TWL LRG LVL3 (GOWN DISPOSABLE) ×1
GOWN STRL REUS W/TWL XL LVL3 (GOWN DISPOSABLE) ×2
HANDLE YANKAUER SUCT BULB TIP (MISCELLANEOUS) ×2 IMPLANT
KIT TURNOVER KIT A (KITS) ×2 IMPLANT
LABEL OR SOLS (LABEL) ×2 IMPLANT
MANIFOLD NEPTUNE II (INSTRUMENTS) ×2 IMPLANT
NS IRRIG 1000ML POUR BTL (IV SOLUTION) ×1 IMPLANT
NS IRRIG 500ML POUR BTL (IV SOLUTION) ×2 IMPLANT
PACK EXTREMITY ARMC (MISCELLANEOUS) ×2 IMPLANT
PAD ABD DERMACEA PRESS 5X9 (GAUZE/BANDAGES/DRESSINGS) ×4 IMPLANT
PAD PREP 24X41 OB/GYN DISP (PERSONAL CARE ITEMS) ×2 IMPLANT
SOL PREP POV-IOD 4OZ 10% (MISCELLANEOUS) ×1 IMPLANT
SOL PREP PROV IODINE SCRUB 4OZ (MISCELLANEOUS) ×1 IMPLANT
SPONGE T-LAP 18X18 ~~LOC~~+RFID (SPONGE) ×5 IMPLANT
STAPLER SKIN PROX 35W (STAPLE) ×2 IMPLANT
STOCKINETTE M/LG 89821 (MISCELLANEOUS) ×2 IMPLANT
SUT SILK 2 0 (SUTURE) ×1
SUT SILK 2 0 SH (SUTURE) ×4 IMPLANT
SUT SILK 2-0 18XBRD TIE 12 (SUTURE) ×1 IMPLANT
SUT VIC AB 0 CT1 36 (SUTURE) ×6 IMPLANT
SUT VIC AB 2-0 CT1 (SUTURE) ×4 IMPLANT

## 2021-03-11 NOTE — Anesthesia Procedure Notes (Signed)
Procedure Name: Intubation Date/Time: 03/11/2021 2:48 PM Performed by: Gilford Raid, CRNA Pre-anesthesia Checklist: Patient identified, Patient being monitored, Timeout performed, Emergency Drugs available and Suction available Patient Re-evaluated:Patient Re-evaluated prior to induction Oxygen Delivery Method: Circle system utilized Preoxygenation: Pre-oxygenation with 100% oxygen Induction Type: IV induction Ventilation: Mask ventilation without difficulty Laryngoscope Size: Miller and 2 Grade View: Grade I Tube type: Oral Tube size: 7.0 mm Number of attempts: 1 Airway Equipment and Method: Stylet Placement Confirmation: ETT inserted through vocal cords under direct vision, positive ETCO2 and breath sounds checked- equal and bilateral Secured at: 21 cm Tube secured with: Tape Dental Injury: Teeth and Oropharynx as per pre-operative assessment

## 2021-03-11 NOTE — Progress Notes (Signed)
Pharmacy Antibiotic Note  [name] [name] is a 80 y.o. male admitted on 03/08/2021. Pharmacy has been consulted for vancomycin and cefepime dosing for wound infection. He is now s/p amputation of the right lower leg and scheduled for an AKA today. His renal function is stable at what appears to be his baseline level.   Plan:  1) continue vancomycin 1500 mg IV every 24 hours Goal AUC 400-550 Expected AUC: 514 SCr used: 1.28  Ke: 0.047 h-1, T1/2: 14.7 h Levels at steady-state if vancomycin is continued Daily renal function assessment while on IV vancomycin  2) continue cefepime 2 grams IV every 12 hours  Height: 6' (182.9 cm) Weight: 86.2 kg (190 lb) IBW/kg (Calculated) : 77.6  Temp (24hrs), Avg:97.7 F (36.5 C), Min:97.4 F (36.3 C), Max:97.9 F (36.6 C)  Recent Labs  Lab 03/08/21 1235 03/08/21 1457 03/09/21 0611 03/09/21 1152 03/09/21 1502 03/09/21 1813 03/10/21 0446 03/11/21 0446  WBC 20.3*  --  26.6*  --   --   --  20.0* 19.5*  CREATININE 1.32*  --  1.15  --   --   --  1.31* 1.28*  LATICACIDVEN 2.6* 2.1*  --  1.3 3.0* 3.1*  --   --      Estimated Creatinine Clearance: 51.4 mL/min (A) (by C-G formula based on SCr of 1.28 mg/dL (H)).    No Known Allergies  Antimicrobials this admission: vancomycin 11/15 >> cefepime 11/15 >>  Microbiology results: 11/15 BCx: NG x 3 days 11/18 WCx: pending 11/15 SARS CoV-2: negative 11/15 influenza A/B: negative  Thank you for allowing pharmacy to be a part of this patient's care.  Lowella Bandy, PharmD, BCPS Clinical Pharmacist 03/11/2021 7:21 AM

## 2021-03-11 NOTE — Care Management Important Message (Signed)
Important Message  Patient Details  Name: [name] [name] MRN: [id number] Date of Birth: Aug 01, 1940   Medicare Important Message Given:  Yes     [name] 03/11/2021, 10:40 AM

## 2021-03-11 NOTE — Progress Notes (Signed)
Physical Therapy Treatment Patient Details Name: [name] [name] MRN: [id number] DOB: Sep 17, 1940 Today's Date: 03/11/2021   History of Present Illness [name] [name] is a 80yoM who comes to [facility] on11/15 presents with sepsis from an infected ulceration of the right foot and ankle. 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 PTA pt was mostly homebound, trying to stay off his foot wound, using RW.    PT Comments    Pt in bed upon entry agreeable to session, plans to go to OR today after 1230; Min-modA to EOB, modA to stand with RW, but is able to perform with miguard and elevated height. Pt educated on technique for pivot transfers in SLS for future mobility. Pt assisted with urinal use and educated on mess avoidance. Pt agreeable to sit at EOB at end of session. Wife and SIL in room.    Recommendations for follow up therapy are one component of a multi-disciplinary discharge planning process, led by the attending physician.  Recommendations may be updated based on patient status, additional functional criteria and insurance authorization.  Follow Up Recommendations  Acute inpatient rehab (3hours/day)     Assistance Recommended at Discharge Set up Supervision/Assistance  Equipment Recommendations  None recommended by PT    Recommendations for Other Services       Precautions / Restrictions Precautions Precautions: Fall Restrictions RLE Weight Bearing: Non weight bearing     Mobility  Bed Mobility Overal bed mobility: Needs Assistance Bed Mobility: Supine to Sit     Supine to sit: Mod assist          Transfers Overall transfer level: Needs assistance Equipment used: Rolling walker (2 wheels) Transfers: Sit to/from Stand Sit to Stand: Mod assist           General transfer comment: is able to perform minGuard asssit from elevated EOB     Ambulation/Gait Ambulation/Gait assistance:  (deferred at this time.)                 Stairs             Wheelchair Mobility    Modified Rankin (Stroke Patients Only)       Balance           Standing balance support: Reliant on assistive device for balance;Bilateral upper extremity supported Standing balance-Leahy Scale: Poor                              Cognition                                                Exercises Other Exercises Other Exercises: STS from 24" EOB +30sec static stand with BUE on RW    General Comments        Pertinent Vitals/Pain Pain Assessment:  (intermittent sting at distal wound.)    Home Living                          Prior Function            PT Goals (current goals can now be found in the care plan section) Acute Rehab PT Goals Patient Stated Goal: stay postiive, not burden  his family PT Goal Formulation: With patient Time For Goal Achievement: 03/24/21 Potential to Achieve Goals: Good Progress towards PT goals: Progressing toward goals    Frequency    7X/week      PT Plan Current plan remains appropriate    Co-evaluation              AM-PAC PT "6 Clicks" Mobility   Outcome Measure  Help needed turning from your back to your side while in a flat bed without using bedrails?: A Little Help needed moving from lying on your back to sitting on the side of a flat bed without using bedrails?: A Lot Help needed moving to and from a bed to a chair (including a wheelchair)?: A Lot Help needed standing up from a chair using your arms (e.g., wheelchair or bedside chair)?: A Lot Help needed to walk in hospital room?: A Lot Help needed climbing 3-5 steps with a railing? : Total 6 Click Score: 12    End of Session Equipment Utilized During Treatment: Gait belt Activity Tolerance: Patient tolerated treatment well;Patient limited by fatigue Patient left: with  call bell/phone within reach;in bed;with nursing/sitter in room;with family/visitor present Nurse Communication: Mobility status PT Visit Diagnosis: Unsteadiness on feet (R26.81);Other abnormalities of gait and mobility (R26.89);Difficulty in walking, not elsewhere classified (R26.2);Muscle weakness (generalized) (M62.81)     Time: 8250-5397 PT Time Calculation (min) (ACUTE ONLY): 41 min  Charges:  $Therapeutic Exercise: 38-52 mins                    10:34 AM, 03/11/21 Rosamaria Lints, PT, DPT Physical Therapist - [facility]  [phone] (ASCOM)     Erial Fikes C 03/11/2021, 10:32 AM

## 2021-03-11 NOTE — Anesthesia Preprocedure Evaluation (Addendum)
Anesthesia Evaluation  Patient identified by MRN, date of birth, ID band Patient awake    Reviewed: Allergy & Precautions, NPO status , Patient's Chart, lab work & pertinent test results, reviewed documented beta blocker date and time   History of Anesthesia Complications Negative for: history of anesthetic complications  Airway Mallampati: II  TM Distance: >3 FB Neck ROM: Full    Dental  (+) Edentulous Upper, Edentulous Lower   Pulmonary neg pulmonary ROS, neg sleep apnea, neg COPD, Patient abstained from smoking.,    breath sounds clear to auscultation- rhonchi (-) wheezing      Cardiovascular Exercise Tolerance: Good hypertension, Pt. on medications and Pt. on home beta blockers + Peripheral Vascular Disease and +CHF  (-) CAD, (-) Past MI and (-) Cardiac Stents + dysrhythmias Atrial Fibrillation  Rhythm:Regular Rate:Normal - Systolic murmurs and - Diastolic murmurs    Neuro/Psych negative neurological ROS  negative psych ROS   GI/Hepatic Neg liver ROS, GERD  ,  Endo/Other  diabetes, Oral Hypoglycemic Agents  Renal/GU negative Renal ROS     Musculoskeletal negative musculoskeletal ROS (+)   Abdominal (+) - obese,   Peds  Hematology negative hematology ROS (+)   Anesthesia Other Findings CHF (congestive heart failure) (HCC) Diabetes (HCC)    Dysrhythmia  AFIB  HTN (hypertension)    Infection  LEG  Pancreatitis       Reproductive/Obstetrics                            Anesthesia Physical  Anesthesia Plan  ASA: 3  Anesthesia Plan: General   Post-op Pain Management:    Induction: Intravenous  PONV Risk Score and Plan: 2 and Midazolam and Propofol infusion  Airway Management Planned: Oral ETT  Additional Equipment:   Intra-op Plan:   Post-operative Plan: Extubation in OR  Informed Consent: I have reviewed the patients History and Physical, chart, labs and discussed the  procedure including the risks, benefits and alternatives for the proposed anesthesia with the patient or authorized representative who has indicated his/her understanding and acceptance.       Plan Discussed with: CRNA, Anesthesiologist and Surgeon  Anesthesia Plan Comments:         Anesthesia Quick Evaluation

## 2021-03-11 NOTE — Interval H&P Note (Signed)
History and Physical Interval Note:  03/11/2021 12:52 PM  [name] [name]  has presented today for surgery, with the diagnosis of Sepsis.  The various methods of treatment have been discussed with the patient and family. After consideration of risks, benefits and other options for treatment, the patient has consented to  Procedure(s): AMPUTATION ABOVE KNEE (Right) as a surgical intervention.  The patient's history has been reviewed, patient examined, no change in status, stable for surgery.  I have reviewed the patient's chart and labs.  Questions were answered to the patient's satisfaction.     Festus Barren

## 2021-03-11 NOTE — Op Note (Signed)
  [facility]   OPERATIVE NOTE   PROCEDURE:  Right above-the-knee amputation  PRE-OPERATIVE DIAGNOSIS: Right foot gangrene  POST-OPERATIVE DIAGNOSIS: same as above  SURGEON:  Festus Barren, MD  ASSISTANT(S): Raul Del, PA-C  ANESTHESIA: general  ESTIMATED BLOOD LOSS: 100 cc  FINDING(S): none  SPECIMEN(S):  Right above-the-knee amputation  INDICATIONS:   [name] [name] is a 80 y.o. male who presents with right foot wet gangrene with sepsis s/p right lower leg guillotine amputation.  The patient is scheduled for a right above-the-knee amputation.  I discussed in depth with the patient the risks, benefits, and alternatives to this procedure.  The patient is aware that the risk of this operation included but are not limited to:  bleeding, infection, myocardial infarction, stroke, death, failure to heal amputation wound, and possible need for more proximal amputation.  The patient is aware of the risks and agrees proceed forward with the procedure. An assistant was present during the procedure to help facilitate the exposure and expedite the procedure.   DESCRIPTION: After full informed written consent was obtained from the patient, the patient was taken to the operating room, and placed supine upon the operating table.  Prior to induction, the patient received IV antibiotics.  The patient was then prepped and draped in the standard fashion for a right above-the-knee amputation. The assistant provided retraction and mobilization to help facilitate exposure and expedite the procedure throughout the entire procedure.  This included following suture, using retractors, and optimizing lighting.   After obtaining adequate anesthesia, the patient was prepped and draped in the standard fashion for a above-the-knee amputation.  I marked out the anterior and posterior flaps for a fish-mouth type of amputation.  I made the incisions for these flaps, and then dissected through the  subcutaneous tissue, fascia, and muscles circumferentially.  I elevated  the periosteal tissue 4-5 cm more proximal than the anterior skin flap.  I then transected the femur with a power saw at this level.  Then I smoothed out the rough edges of the bone.  At this point, the specimen was passed off the field as the above-the-knee amputation.  At this point, I clamped all visibly bleeding arteries and veins using a combination of suture ligation with silk suture and electrocautery.   Bleeding continued to be controlled with electrocautery and suture ligature.  The stump was washed off with sterile normal saline and no further active bleeding was noted.  I reapproximated the anterior and posterior fascia  with interrupted stitches of 0 Vicryl.  This was completed along the entire length of anterior and posterior fascia until there were no more loose space in the fascial line. The subcutaneous tissue was then approximated with 2-0 vicryl sutures. The skin was then  reapproximated with staples.  The stump was washed off and dried.  The incision was dressed with Xeroform and ABD pads, and  then fluffs were applied.  Kerlix was wrapped around the leg and then gently an ACE wrap was applied.  A large Ioban was then placed over the ACE wrap to secure the dressing. The patient was then awakened and take to the recovery room in stable condition.   COMPLICATIONS: none  CONDITION: stable  Festus Barren  03/11/2021, 3:18 PM   This note was created with Dragon Medical transcription system. Any errors in dictation are purely unintentional.

## 2021-03-11 NOTE — Transfer of Care (Signed)
Immediate Anesthesia Transfer of Care Note  Patient: [name] [name]  Procedure(s) Performed: AMPUTATION ABOVE KNEE (Right: Leg Upper)  Patient Location: PACU  Anesthesia Type:General  Level of Consciousness: drowsy  Airway & Oxygen Therapy: Patient Spontanous Breathing and Patient connected to face mask oxygen  Post-op Assessment: Report given to RN and Post -op Vital signs reviewed and stable  Post vital signs: Reviewed and stable  Last Vitals:  Vitals Value Taken Time  BP    Temp    Pulse 90 03/11/21 1539  Resp 13 03/11/21 1539  SpO2 95 % 03/11/21 1539  Vitals shown include unvalidated device data.  Last Pain:  Vitals:   03/11/21 1231  TempSrc: Temporal  PainSc:          Complications: No notable events documented.

## 2021-03-11 NOTE — Progress Notes (Signed)
PROGRESS NOTE   HPI was taken from Dr. Thomes Dinning: [name] [name] is a 80 y.o. male with medical history significant for essential hypertension, type 2 diabetes mellitus, atrial fibrillation on Xarelto, CHF, mixed hyperlipidemia who presents to the emergency department accompanied by wife due to several day onset of worsening pain from chronic wound of right ankle.  Patient has been following with wound care center for 2 to 3 months, hyperbaric treatment was recently started within last 2 weeks (per wife at bedside), patient went to the wound care center today due to several day onset of worsening pain from the wound and inflammation of right ankle.  After evaluation at the wound clinic, he was asked to go to the ED for further evaluation due to concern for acute osteomyelitis and possible septic joint.  Patient denies chest pain, shortness of breath, fever, chills, nausea, vomiting.  Patient was unsure of being on antibiotics.  He states that he stopped taking his Xarelto few days ago.   ED Course:  In the emergency department, he was hemodynamically stable, though BP was soft at 104/60.  Work-up in the ED showed leukocytosis with a left shift.  Normocytic anemia, hyponatremia, hypoalbuminemia, hyperglycemia, lactic acid 2.6 > 2.1.  Sed rate 126.  Influenza A, B, SARS coronavirus 2 was negative. Right ankle x-ray showed large lateral also, the ankle with suspicion for osteomyelitis.  Intra-articular gas at the anterior tibiotalar joint is concerning for direct complication/septic arthritis.  Extensive soft tissue swelling at the ankle with gas medially, concern for cellulitis with gas-forming organism. Chest x-ray showed no acute cardiopulmonary process. Patient was treated with IV cefepime and vancomycin.  IV hydration was provided, morphine was given due to pain.  Hospitalist was asked to admit patient for further evaluation and management.   As per Dr. Mayford Knife 11/16 & 03/01/21: Pt will go for right AKA  today as per vascular surg. Pt is still on IV vanco, cefepime and abxs can likely be narrow or d/c after right [name] [name]  [name] [name]:[id number] DOB: [birth year] DOA: 03/08/2021 PCP: [name] Quarry, NP   Assessment & Plan:   Principal Problem:   Acute osteomyelitis of right ankle (HCC) Active Problems:   Atrial fibrillation (HCC)   CHF (congestive heart failure) (HCC)   Open wound of right ankle   Lactic acidosis   Hypoalbuminemia due to protein-calorie malnutrition (HCC)   Hyperglycemia due to diabetes mellitus (HCC)   Hyponatremia   Mixed hyperlipidemia   Peripheral arterial disease (HCC)   Malnutrition of moderate degree   Acute osteomyelitis of right ankle: open wound of right ankle with suspicious septic arthritis. Hx of PAD. S/p 1 month of right lower extremity endovascular intervention. 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 Continue on IV vanco, cefepime. Will go for right AKA today as per vascular surg   ACD: w/ likely component of acute blood loss anemia secondary to recent surgeries. S/p 1 unit of pRBCs transfused. H&H are trending up today. Continue on iron supplements   Leukocytosis: likely secondary to infection. Continue on IV abxs   Lactic acidosis: labile. Repeat lactic acid ordered   Hyponatremia: resolved   DM2: likely poorly controlled. Continue on SSI w/ accuchecks   Hypoalbuminemia: secondary to moderate protein calorie malnutrition. Continue on nutritional supplements   Hx of CHF: unknown systolic vs diastolic vs combined. Appears compensated currently. Continue on coreg, statin, aspirin. Continue to hold home dose of ACE-I secondary to low normal  BP   HTN: continue on coreg and can restart lisinopril tomorrow if Cr is trending down   Chronic atrial fibrillation: continue on coreg. Hold xarelto for right AKA today     HLD: continue on statin   Moderate malnutrition: w/ mod fat & muscle depletion. Continue  nutritional supplements   DVT prophylaxis: SCDs Code Status: full  Family Communication: discussed pt's care w/ pt's family at bedside and answered their questions  Disposition Plan:possibly CIR  Level of care: Med-Surg  Status is: Inpatient  Remains inpatient appropriate because: severity of illness, will go for right AKA today    Consultants:  Vascular surg Ortho surg   Procedures:  Antimicrobials: vanco, cefepime    Subjective: Pt c/o fatigue  Objective: Vitals:   03/10/21 1600 03/10/21 1700 03/10/21 2005 03/11/21 0334  BP: 99/73 110/72 129/81 136/83  Pulse: 78 83 62 87  Resp: 18 18 16 16   Temp: 97.9 F (36.6 C) 97.6 F (36.4 C) 97.9 F (36.6 C) 97.7 F (36.5 C)  TempSrc: Oral Oral  Oral  SpO2: 96% 92% 90% 95%  Weight:      Height:        Intake/Output Summary (Last 24 hours) at 03/11/2021 0801 Last data filed at 03/11/2021 0719 Gross per 24 hour  Intake 798 ml  Output 1925 ml  Net -1127 ml   Filed Weights   03/08/21 1221 03/09/21 0949  Weight: 86.2 kg 86.2 kg    Examination:  General exam: Appears comfortable  Respiratory system: clear breath sounds b/l  Cardiovascular system: irregularly irregular. No rubs or clicks  Gastrointestinal system: Abd is soft, NT, ND & normal bowel sounds  Central nervous system: Alert and oriented. Moves all extremities  Psychiatry: Judgement and insight appears normal. Flat mood and affect     Data Reviewed: I have personally reviewed following labs and imaging studies  CBC: Recent Labs  Lab 03/08/21 1235 03/09/21 0611 03/10/21 0446 03/11/21 0446  WBC 20.3* 26.6* 20.0* 19.5*  NEUTROABS 16.4*  --   --   --   HGB 9.4* 8.2* 6.8* 8.6*  HCT 27.5* 23.5* 19.6* 25.6*  MCV 81.1 78.3* 80.0 79.5*  PLT 311 244 248 281   Basic Metabolic Panel: Recent Labs  Lab 03/08/21 1235 03/09/21 0611 03/10/21 0446 03/11/21 0446  NA 134* 135 137 136  K 4.0 4.0 4.2 4.3  CL 98 101 106 106  CO2 27 27 25 25   GLUCOSE 172*  78 141* 107*  BUN 21 16 23  33*  CREATININE 1.32* 1.15 1.31* 1.28*  CALCIUM 8.7* 8.6* 7.7* 7.9*  MG  --  1.8 2.1 2.3  PHOS  --  2.7  --   --    GFR: Estimated Creatinine Clearance: 51.4 mL/min (A) (by C-G formula based on SCr of 1.28 mg/dL (H)). Liver Function Tests: Recent Labs  Lab 03/08/21 1235 03/09/21 0611  AST 34 32  ALT 19 15  ALKPHOS 110 113  BILITOT 0.6 0.9  PROT 7.6 6.8  ALBUMIN 2.2* 2.1*   No results for input(s): LIPASE, AMYLASE in the last 168 hours. No results for input(s): AMMONIA in the last 168 hours. Coagulation Profile: Recent Labs  Lab 03/08/21 1235 03/09/21 0611 03/11/21 0446  INR 1.4* 1.4* 1.3*   Cardiac Enzymes: No results for input(s): CKTOTAL, CKMB, CKMBINDEX, TROPONINI in the last 168 hours. BNP (last 3 results) No results for input(s): PROBNP in the last 8760 hours. HbA1C: Recent Labs    03/08/21 1235 03/10/21 0446  HGBA1C  4.9 5.0   CBG: Recent Labs  Lab 03/09/21 2113 03/10/21 0749 03/10/21 1131 03/10/21 1640 03/10/21 2130  GLUCAP 172* 119* 203* 162* 75   Lipid Profile: No results for input(s): CHOL, HDL, LDLCALC, TRIG, CHOLHDL, LDLDIRECT in the last 72 hours. Thyroid Function Tests: No results for input(s): TSH, T4TOTAL, FREET4, T3FREE, THYROIDAB in the last 72 hours. Anemia Panel: Recent Labs    03/10/21 0446  FERRITIN 416*  TIBC 127*  IRON 28*   Sepsis Labs: Recent Labs  Lab 03/08/21 1457 03/09/21 1152 03/09/21 1502 03/09/21 1813  LATICACIDVEN 2.1* 1.3 3.0* 3.1*    Recent Results (from the past 240 hour(s))  Culture, blood (Routine x 2)     Status: None (Preliminary result)   Collection Time: 03/08/21 12:35 PM   Specimen: BLOOD  Result Value Ref Range Status   Specimen Description BLOOD BLOOD RIGHT FOREARM  Final   Special Requests   Final    BOTTLES DRAWN AEROBIC AND ANAEROBIC Blood Culture adequate volume   Culture   Final    NO GROWTH 3 DAYS Performed at [facility], [address]  [place], [place] [address]    Report Status PENDING  Incomplete  Culture, blood (Routine x 2)     Status: None (Preliminary result)   Collection Time: 03/08/21  2:04 PM   Specimen: BLOOD  Result Value Ref Range Status   Specimen Description BLOOD left arm  Final   Special Requests   Final    BOTTLES DRAWN AEROBIC AND ANAEROBIC Blood Culture results may not be optimal due to an inadequate volume of blood received in culture bottles   Culture   Final    NO GROWTH 3 DAYS Performed at [facility], [address], [place], [place] [address]    Report Status PENDING  Incomplete  Resp Panel by RT-PCR (Flu A&B, Covid) Nasopharyngeal Swab     Status: None   Collection Time: 03/08/21  2:05 PM   Specimen: Nasopharyngeal Swab; Nasopharyngeal(NP) swabs in vial transport medium  Result Value Ref Range Status   SARS Coronavirus 2 by RT PCR NEGATIVE NEGATIVE Final    Comment: (NOTE) SARS-CoV-2 target nucleic acids are NOT DETECTED.  The SARS-CoV-2 RNA is generally detectable in upper respiratory specimens during the acute phase of infection. The lowest concentration of SARS-CoV-2 viral copies this assay can detect is 138 copies/mL. A negative result does not preclude SARS-Cov-2 infection and should not be used as the sole basis for treatment or other patient management decisions. A negative result may occur with  improper specimen collection/handling, submission of specimen other than nasopharyngeal swab, presence of viral mutation(s) within the areas targeted by this assay, and inadequate number of viral copies(<138 copies/mL). A negative result must be combined with clinical observations, patient history, and epidemiological information. The expected result is Negative.  Fact Sheet for Patients:  BloggerCourse.com  Fact Sheet for Healthcare Providers:  SeriousBroker.it  This test is no t yet approved or cleared by the [place] FDA  and  has been authorized for detection and/or diagnosis of SARS-CoV-2 by FDA under an Emergency Use Authorization (EUA). This EUA will remain  in effect (meaning this test can be used) for the duration of the COVID-19 declaration under Section 564(b)(1) of the Act, 21 U.S.C.section 360bbb-3(b)(1), unless the authorization is terminated  or revoked sooner.       Influenza A by PCR NEGATIVE NEGATIVE Final   Influenza B by PCR NEGATIVE NEGATIVE Final    Comment: (  NOTE) The Xpert Xpress SARS-CoV-2/FLU/RSV plus assay is intended as an aid in the diagnosis of influenza from Nasopharyngeal swab specimens and should not be used as a sole basis for treatment. Nasal washings and aspirates are unacceptable for Xpert Xpress SARS-CoV-2/FLU/RSV testing.  Fact Sheet for Patients: BloggerCourse.com  Fact Sheet for Healthcare Providers: SeriousBroker.it  This test is not yet approved or cleared by the [place] FDA and has been authorized for detection and/or diagnosis of SARS-CoV-2 by FDA under an Emergency Use Authorization (EUA). This EUA will remain in effect (meaning this test can be used) for the duration of the COVID-19 declaration under Section 564(b)(1) of the Act, 21 U.S.C. section 360bbb-3(b)(1), unless the authorization is terminated or revoked.  Performed at [facility], [address], [place], [place] [address]          Radiology Studies: No results found.      Scheduled Meds:  vitamin C  500 mg Oral BID   atorvastatin  10 mg Oral q1800   carvedilol  6.25 mg Oral BID   digoxin  0.125 mg Oral Daily   furosemide  20 mg Intravenous Once   gabapentin  100 mg Oral TID   insulin aspart  0-5 Units Subcutaneous QHS   insulin aspart  0-9 Units Subcutaneous TID WC   multivitamin-lutein  1 capsule Oral Daily   Ensure Max Protein  11 oz Oral TID   vitamin B-12  1,000 mcg Oral Daily   Continuous  Infusions:  sodium chloride 75 mL/hr at 03/11/21 0014   ceFEPime (MAXIPIME) IV 2 g (03/10/21 2238)   vancomycin 1,500 mg (03/10/21 1644)     LOS: 3 days    Time spent: 31 mins     Charise Killian, MD [facility] Pager [phone]  If 7PM-7AM, please contact night-coverage 03/11/2021, 8:01 AM

## 2021-03-11 NOTE — Progress Notes (Signed)
OT Cancellation Note  Patient Details Name: Samir Ishaq MRN: [id number] DOB: [birth year]   Cancelled Treatment:    Reason Eval/Treat Not Completed: Patient at procedure or test/ unavailable. Pt noted to be off the floor for planned AKA, unavailable at this time. Will continue to follow POC at later date/time as pt available. Will require new orders or continue at transfer orders to resume services.   Kathie Dike, M.S. OTR/L  03/11/21, 12:40 PM  ascom [phone]

## 2021-03-11 NOTE — Progress Notes (Signed)
Inpatient Rehabilitation Admissions Coordinator   Noted plans for OR today. I will follow up on Monday with postoperative therapy progress and discussions with patient on his rehab venue options.  Ottie Glazier, RN, MSN Rehab Admissions Coordinator [phone] 03/11/2021 11:37 AM

## 2021-03-11 NOTE — Plan of Care (Signed)
Continuing with plan of care. 

## 2021-03-11 NOTE — Progress Notes (Signed)
Patient returned from surgery and is alert and agitated.  B/P 179/94 (114); Dr. Mayford Knife notified and ordered hydralazine IV.

## 2021-03-12 DIAGNOSIS — Z8679 Personal history of other diseases of the circulatory system: Secondary | ICD-10-CM

## 2021-03-12 DIAGNOSIS — I5032 Chronic diastolic (congestive) heart failure: Secondary | ICD-10-CM

## 2021-03-12 DIAGNOSIS — M86171 Other acute osteomyelitis, right ankle and foot: Secondary | ICD-10-CM | POA: Diagnosis not present

## 2021-03-12 DIAGNOSIS — R7309 Other abnormal glucose: Secondary | ICD-10-CM

## 2021-03-12 DIAGNOSIS — E440 Moderate protein-calorie malnutrition: Secondary | ICD-10-CM

## 2021-03-12 DIAGNOSIS — D638 Anemia in other chronic diseases classified elsewhere: Secondary | ICD-10-CM | POA: Diagnosis not present

## 2021-03-12 DIAGNOSIS — E872 Acidosis, unspecified: Secondary | ICD-10-CM | POA: Diagnosis not present

## 2021-03-12 DIAGNOSIS — E785 Hyperlipidemia, unspecified: Secondary | ICD-10-CM

## 2021-03-12 DIAGNOSIS — D72829 Elevated white blood cell count, unspecified: Secondary | ICD-10-CM

## 2021-03-12 LAB — GLUCOSE, CAPILLARY
Glucose-Capillary: 99 mg/dL (ref 70–99)
Glucose-Capillary: 149 mg/dL — ABNORMAL HIGH (ref 70–99)

## 2021-03-12 LAB — CBC
HCT: 23.4 % — ABNORMAL LOW (ref 39.0–52.0)
Hemoglobin: 7.9 g/dL — ABNORMAL LOW (ref 13.0–17.0)
MCH: 26.2 pg (ref 26.0–34.0)
MCHC: 33.8 g/dL (ref 30.0–36.0)
MCV: 77.5 fL — ABNORMAL LOW (ref 80.0–100.0)
Platelets: 278 10*3/uL (ref 150–400)
RBC: 3.02 MIL/uL — ABNORMAL LOW (ref 4.22–5.81)
RDW: 15.8 % — ABNORMAL HIGH (ref 11.5–15.5)
WBC: 16.8 10*3/uL — ABNORMAL HIGH (ref 4.0–10.5)
nRBC: 0 % (ref 0.0–0.2)

## 2021-03-12 LAB — CREATININE, SERUM
Creatinine, Ser: 1 mg/dL (ref 0.61–1.24)
GFR, Estimated: >60 mL/min (ref >60)

## 2021-03-12 NOTE — Progress Notes (Addendum)
Physical Therapy Treatment Patient Details Name: [name] [name] MRN: [id number] DOB: December 26, 1940 Today's Date: 03/12/2021   History of Present Illness [name] [name] is a 80 y/o M who comes to [facility] on 11/15 presents with sepsis from an infected ulceration of the right foot and ankle. 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 PTA pt was mostly homebound, trying to stay off his foot wound, using RW. 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 Wyn Quaker. Pt with continue on transfer orders.    PT Comments    PT with continue on transfer orders following R AKA yesterday. Pt agreeable to tx with pt able to complete sit>stand & stand pivot transfers with mod/max assist with RW. Pt demonstrates impaired balance & eccentric control & minimal standing tolerance but is motivated to participate & improve. PT re-wrapped RLE residual limb with ace wrap. Pt is eager to d/c to rehab to increase independence with functional mobility.  All PT goals remain appropriate at this time.    Recommendations for follow up therapy are one component of a multi-disciplinary discharge planning process, led by the attending physician.  Recommendations may be updated based on patient status, additional functional criteria and insurance authorization.  Follow Up Recommendations  Acute inpatient rehab (3hours/day)     Assistance Recommended at Discharge Frequent or constant Supervision/Assistance  Equipment Recommendations   (TBD in next venue)    Recommendations for Other Services       Precautions / Restrictions Precautions Precautions: Fall Restrictions Weight Bearing Restrictions: Yes RLE Weight Bearing: Non weight bearing     Mobility  Bed Mobility Overal bed mobility: Needs Assistance Bed Mobility: Supine to Sit     Supine to sit: Supervision     General bed mobility comments: extra time, cuing  to utilize bed rails, HOB elevated    Transfers Overall transfer level: Needs assistance Equipment used: Rolling walker (2 wheels) Transfers: Sit to/from Stand Sit to Stand: Mod assist;Max assist (Max cuing for hand placement, Initial sit>stand pt pulling up on RW with BUE & PT stabilizing RW, subsequent attempt from recliner pt able to push to standing with BUE & mod/max assist) Stand pivot transfers: Max assist (Pt able to pivot LLE towards recliner, does attempt to weight bear through BUE to take weight off LLE to advance it, has extremely poor eccentric lowering & requires PT assistance to safely lower to chair)         General transfer comment: Pt is able to transfer sit>Stand from low recliner with mod/max assist for balance, tolerates standing for a max of ~10 seconds before returning to sitting.    Ambulation/Gait                   Stairs             Wheelchair Mobility    Modified Rankin (Stroke Patients Only)       Balance Overall balance assessment: Needs assistance Sitting-balance support: Feet supported;Bilateral upper extremity supported Sitting balance-Leahy Scale: Fair Sitting balance - Comments: close supervision static sitting   Standing balance support: Bilateral upper extremity supported;During functional activity Standing balance-Leahy Scale: Zero Standing balance comment: BUE support on RW with mod/max assist during stand pivot transfer  Cognition Arousal/Alertness: Awake/alert Behavior During Therapy: WFL for tasks assessed/performed Overall Cognitive Status: Within Functional Limits for tasks assessed                                          Exercises      General Comments General comments (skin integrity, edema, etc.): PT re-wrapped RLE residual limb with ace wrap      Pertinent Vitals/Pain Pain Assessment: Faces Faces Pain Scale: Hurts little more Pain Location: when  re-wrapping/touching residual limb Pain Descriptors / Indicators: Discomfort;Grimacing Pain Intervention(s): Repositioned;Limited activity within patient's tolerance;Monitored during session    Home Living                          Prior Function            PT Goals (current goals can now be found in the care plan section) Acute Rehab PT Goals Patient Stated Goal: get better PT Goal Formulation: With patient Time For Goal Achievement: 03/24/21 Potential to Achieve Goals: Good Progress towards PT goals: Progressing toward goals    Frequency    7X/week      PT Plan Current plan remains appropriate    Co-evaluation              AM-PAC PT "6 Clicks" Mobility   Outcome Measure  Help needed turning from your back to your side while in a flat bed without using bedrails?: A Little Help needed moving from lying on your back to sitting on the side of a flat bed without using bedrails?: A Lot Help needed moving to and from a bed to a chair (including a wheelchair)?: A Lot Help needed standing up from a chair using your arms (e.g., wheelchair or bedside chair)?: Total Help needed to walk in hospital room?: Total Help needed climbing 3-5 steps with a railing? : Total 6 Click Score: 10    End of Session Equipment Utilized During Treatment: Gait belt Activity Tolerance: Patient tolerated treatment well Patient left: in chair;with chair alarm set;with call bell/phone within reach;with family/visitor present Nurse Communication: Mobility status PT Visit Diagnosis: Unsteadiness on feet (R26.81);Other abnormalities of gait and mobility (R26.89);Difficulty in walking, not elsewhere classified (R26.2);Muscle weakness (generalized) (M62.81)     Time: 7096-2836 PT Time Calculation (min) (ACUTE ONLY): 23 min  Charges:  $Therapeutic Activity: 23-37 mins                     Aleda Grana, PT, DPT 03/12/21, 1:10 PM    Sandi Mariscal 03/12/2021, 1:08 PM

## 2021-03-12 NOTE — Plan of Care (Signed)
Continuing with plan of care. 

## 2021-03-12 NOTE — Progress Notes (Signed)
Patient ID: Niccolas Loeper, male   DOB: 1941-03-26, 80 y.o.   MRN: [id number] [facility] PROGRESS NOTE  Jehad Bisono WUJ:[id number] DOB: 26-Nov-1940 DOA: 03/08/2021 PCP: Lorenso Quarry, NP  HPI/Subjective: Patient concerned that the amputation is higher than he initially thought.  Slight pain in the leg.  His dressing was off this morning when I saw him and I alerted the nurse to replace.  No shortness of breath or chest pain.  Objective: Vitals:   03/12/21 0543 03/12/21 1000  BP: 128/74 (!) 152/71  Pulse: 96 78  Resp: 20 16  Temp: 98 F (36.7 C) 98.2 F (36.8 C)  SpO2: 99% 94%    Intake/Output Summary (Last 24 hours) at 03/12/2021 1413 Last data filed at 03/12/2021 1035 Gross per 24 hour  Intake 1300 ml  Output 1100 ml  Net 200 ml   Filed Weights   03/08/21 1221 03/09/21 0949 03/11/21 1231  Weight: 86.2 kg 86.2 kg 86.2 kg    ROS: Review of Systems  Respiratory:  Negative for cough and shortness of breath.   Cardiovascular:  Negative for chest pain.  Gastrointestinal:  Negative for abdominal pain, nausea and vomiting.  Musculoskeletal:  Positive for joint pain.  Exam: Physical Exam HENT:     Head: Normocephalic.     Mouth/Throat:     Pharynx: No oropharyngeal exudate.  Eyes:     General: Lids are normal.  Cardiovascular:     Rate and Rhythm: Normal rate and regular rhythm.     Heart sounds: Normal heart sounds, S1 normal and S2 normal.  Pulmonary:     Breath sounds: No decreased breath sounds, wheezing, rhonchi or rales.  Abdominal:     Palpations: Abdomen is soft.     Tenderness: There is no abdominal tenderness.  Musculoskeletal:     Right upper leg: Swelling present.     Left lower leg: No swelling.  Skin:    General: Skin is warm.     Findings: No rash.     Comments: Surgical site clean and dry.  Patient's dressing was in the bed.  Neurological:     Mental Status: He is alert and oriented to person, place, and time.      Scheduled Meds:   vitamin C  500 mg Oral BID   atorvastatin  10 mg Oral q1800   carvedilol  6.25 mg Oral BID   digoxin  0.125 mg Oral Daily   ferrous gluconate  324 mg Oral BID WC   furosemide  20 mg Intravenous Once   gabapentin  100 mg Oral TID   insulin aspart  0-5 Units Subcutaneous QHS   insulin aspart  0-9 Units Subcutaneous TID WC   multivitamin-lutein  1 capsule Oral Daily   Ensure Max Protein  11 oz Oral TID   vitamin B-12  1,000 mcg Oral Daily   Continuous Infusions:  sodium chloride 75 mL/hr at 03/11/21 2216   ceFEPime (MAXIPIME) IV 2 g (03/12/21 1016)   vancomycin Stopped (03/10/21 1844)    Assessment/Plan:  Acute osteomyelitis of the right ankle with open wounds.  History of PAD.  Patient had guillotine amputation of the leg on 03/09/2021 and AKA on 03/11/2021.  Pain control.  Continue antibiotics today. Anemia of chronic disease.  Hemoglobin 7.9 today.  Watch hemoglobin closely.  Patient has received 1 unit of packed red blood cells during the hospital course. Leukocytosis.  Continue antibiotics today recheck white count tomorrow. Lactic acidosis secondary to infection Hyponatremia has resolved Chronic diastolic  congestive heart failure.  Echocardiogram showing normal EF as outpatient.  Patient on Coreg and Lasix. Hyperlipidemia unspecified on atorvastatin. Hemoglobin A1c low at 5.0.  Discontinue sliding scale insulin Chronic atrial fibrillation.  Holding Xarelto for right now hopefully can restart tomorrow. 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

## 2021-03-12 NOTE — Anesthesia Postprocedure Evaluation (Signed)
Anesthesia Post Note  Patient: [name] [name]  Procedure(s) Performed: AMPUTATION ABOVE KNEE (Right: Leg Upper)  Patient location during evaluation: PACU Anesthesia Type: General Level of consciousness: awake and alert Pain management: pain level controlled Vital Signs Assessment: post-procedure vital signs reviewed and stable Respiratory status: spontaneous breathing, nonlabored ventilation, respiratory function stable and patient connected to nasal cannula oxygen Cardiovascular status: blood pressure returned to baseline and stable Postop Assessment: no apparent nausea or vomiting Anesthetic complications: no   No notable events documented.   Last Vitals:  Vitals:   03/12/21 0543 03/12/21 1000  BP: 128/74 (!) 152/71  Pulse: 96 78  Resp: 20 16  Temp: 36.7 C 36.8 C  SpO2: 99% 94%    Last Pain:  Vitals:   03/12/21 1000  TempSrc: Oral  PainSc:                  Lenard Simmer

## 2021-03-13 DIAGNOSIS — D500 Iron deficiency anemia secondary to blood loss (chronic): Secondary | ICD-10-CM

## 2021-03-13 DIAGNOSIS — D62 Acute posthemorrhagic anemia: Secondary | ICD-10-CM | POA: Diagnosis not present

## 2021-03-13 DIAGNOSIS — M86171 Other acute osteomyelitis, right ankle and foot: Secondary | ICD-10-CM | POA: Diagnosis not present

## 2021-03-13 DIAGNOSIS — D72829 Elevated white blood cell count, unspecified: Secondary | ICD-10-CM | POA: Diagnosis not present

## 2021-03-13 DIAGNOSIS — E872 Acidosis, unspecified: Secondary | ICD-10-CM | POA: Diagnosis not present

## 2021-03-13 LAB — BPAM RBC
Blood Product Expiration Date: 202212082359
Blood Product Expiration Date: 202212082359
Blood Product Expiration Date: 202212142359
ISSUE DATE / TIME: 202211171412
Unit Type and Rh: 6200
Unit Type and Rh: 6200
Unit Type and Rh: 6200

## 2021-03-13 LAB — TYPE AND SCREEN
ABO/RH(D): A POS
Antibody Screen: NEGATIVE
Unit division: 0
Unit division: 0
Unit division: 0

## 2021-03-13 LAB — CULTURE, BLOOD (ROUTINE X 2)
Culture: NO GROWTH
Culture: NO GROWTH
Special Requests: ADEQUATE

## 2021-03-13 LAB — PREPARE RBC (CROSSMATCH)

## 2021-03-13 LAB — CBC
HCT: 20.5 % — ABNORMAL LOW (ref 39.0–52.0)
Hemoglobin: 6.9 g/dL — ABNORMAL LOW (ref 13.0–17.0)
MCH: 25.8 pg — ABNORMAL LOW (ref 26.0–34.0)
MCHC: 33.7 g/dL (ref 30.0–36.0)
MCV: 76.8 fL — ABNORMAL LOW (ref 80.0–100.0)
Platelets: 233 10*3/uL (ref 150–400)
RBC: 2.67 MIL/uL — ABNORMAL LOW (ref 4.22–5.81)
RDW: 15.4 % (ref 11.5–15.5)
WBC: 15.5 10*3/uL — ABNORMAL HIGH (ref 4.0–10.5)
nRBC: 0 % (ref 0.0–0.2)

## 2021-03-13 LAB — GLUCOSE, CAPILLARY: Glucose-Capillary: 136 mg/dL — ABNORMAL HIGH (ref 70–99)

## 2021-03-13 MED ORDER — SODIUM CHLORIDE 0.9% IV SOLUTION: Freq: Once | INTRAVENOUS | Status: AC

## 2021-03-13 MED ORDER — FUROSEMIDE 10 MG/ML IJ SOLN
20.0000 mg | Freq: Once | INTRAMUSCULAR | Status: AC
  Administered 2021-03-13: 20 mg via INTRAVENOUS

## 2021-03-13 MED ORDER — ACETAMINOPHEN 325 MG PO TABS
650.0000 mg | ORAL_TABLET | Freq: Once | ORAL | Status: AC
  Administered 2021-03-13: 650 mg via ORAL
  Filled 2021-03-13: qty 2

## 2021-03-13 NOTE — TOC Progression Note (Signed)
Transition of Care [facility]) - Progression Note    Patient Details  Name: [name] [name] MRN: [id number] Date of Birth: 08-27-1940  Transition [facility]) CM/SW Contact  Ashley Royalty Lutricia Feil, RN Phone [phone] 03/13/2021, 4:38 PM  Clinical Narrative:    Spoke with both the pt and his wife [name] [name]) no the recommended SNF. Receptive to bed search for SNF. Will obtain a PASRR and start bed search for placement.  TOC team will remain available for ongoing discharge needs.       Expected Discharge Plan and Services                                                 Social Determinants of Health (SDOH) Interventions    Readmission Risk Interventions No flowsheet data found.

## 2021-03-13 NOTE — Progress Notes (Signed)
Physical Therapy Treatment Patient Details Name: [name] [name] MRN: [id number] DOB: 05/07/1940 Today's Date: 03/13/2021   History of Present Illness [name] [name] is a 80 y/o M who comes to [facility] on 11/15 presents with sepsis from an infected ulceration of the right foot and ankle. 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 PTA pt was mostly homebound, trying to stay off his foot wound, using RW. 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 Wyn Quaker. Pt with continue on transfer orders.    PT Comments    Pt wake and feeling good.  Hgb noted to be 6.9 and awaiting blood transfusion.  Session limited to supine ex and education.  Will continue with mobility as appropriate tomorrow.  Recommendations for follow up therapy are one component of a multi-disciplinary discharge planning process, led by the attending physician.  Recommendations may be updated based on patient status, additional functional criteria and insurance authorization.  Follow Up Recommendations  Acute inpatient rehab (3hours/day)     Assistance Recommended at Discharge Frequent or constant Supervision/Assistance  Equipment Recommendations       Recommendations for Other Services       Precautions / Restrictions Precautions Precautions: Fall Restrictions Weight Bearing Restrictions: Yes RLE Weight Bearing: Non weight bearing     Mobility  Bed Mobility                    Transfers                        Ambulation/Gait                   Stairs             Wheelchair Mobility    Modified Rankin (Stroke Patients Only)       Balance                                            Cognition Arousal/Alertness: Awake/alert Behavior During Therapy: WFL for tasks assessed/performed Overall Cognitive Status: Within Functional Limits for tasks assessed                                           Exercises Other Exercises Other Exercises: supine BLE x 10    General Comments        Pertinent Vitals/Pain Pain Assessment: Faces Faces Pain Scale: Hurts a little bit Pain Location: when re-wrapping/touching residual limb Pain Descriptors / Indicators: Discomfort;Grimacing Pain Intervention(s): Limited activity within patient's tolerance;Monitored during session;Repositioned    Home Living                          Prior Function            PT Goals (current goals can now be found in the care plan section) Progress towards PT goals: Progressing toward goals    Frequency    7X/week      PT Plan Current plan remains appropriate    Co-evaluation              AM-PAC PT "6 Clicks" Mobility  Outcome Measure  Help needed turning from your back to your side while in a flat bed without using bedrails?: A Little Help needed moving from lying on your back to sitting on the side of a flat bed without using bedrails?: A Lot Help needed moving to and from a bed to a chair (including a wheelchair)?: A Lot Help needed standing up from a chair using your arms (e.g., wheelchair or bedside chair)?: Total Help needed to walk in hospital room?: Total Help needed climbing 3-5 steps with a railing? : Total 6 Click Score: 10    End of Session Equipment Utilized During Treatment: Gait belt Activity Tolerance: Patient tolerated treatment well Patient left: in bed;with call bell/phone within reach;with bed alarm set Nurse Communication: Mobility status PT Visit Diagnosis: Unsteadiness on feet (R26.81);Other abnormalities of gait and mobility (R26.89);Difficulty in walking, not elsewhere classified (R26.2);Muscle weakness (generalized) (M62.81)     Time: 7106-2694 PT Time Calculation (min) (ACUTE ONLY): 8 min  Charges:  $Therapeutic Exercise: 8-22 mins                    Danielle Dess, PTA 03/13/21, 10:47  AM

## 2021-03-13 NOTE — NC FL2 (Signed)
[place] MEDICAID FL2 LEVEL OF CARE SCREENING TOOL     IDENTIFICATION  Patient Name: [name] [name] Birthdate: 10/20/1940 Sex: male Admission Date (Current Location): 03/08/2021  [facility] and [place] Number:      Facility and Address:  [facility], [address] [place], [place] [address]      Provider Number: [id number]  Attending Physician Name and Address:  Alford Highland, MD  Relative Name and Phone Number:  [name] [name] [phone]    Current Level of Care: Hospital Recommended Level of Care: Skilled Nursing Facility Prior Approval Number:    Date Approved/Denied: 03/13/21 PASRR Number: [id number]  Discharge Plan: SNF    Current Diagnoses: Patient Active Problem List   Diagnosis Date Noted   Acute blood loss anemia    Anemia of chronic disease    Leukocytosis    Chronic diastolic CHF (congestive heart failure) (HCC)    Moderate malnutrition (HCC) 03/11/2021   Acute osteomyelitis of right ankle (HCC) 03/08/2021   Open wound of right ankle 03/08/2021   Lactic acidosis 03/08/2021   Hypoalbuminemia due to protein-calorie malnutrition (HCC) 03/08/2021   Hyperglycemia due to diabetes mellitus (HCC) 03/08/2021   Hyponatremia 03/08/2021   Mixed hyperlipidemia 03/08/2021   Peripheral arterial disease (HCC) 03/08/2021   CHF (congestive heart failure) (HCC) 02/02/2021   Diabetes mellitus type 2, uncomplicated (HCC) 02/02/2021   Right leg weakness 11/19/2019   Vitamin B12 deficiency 05/20/2018   Vitamin D deficiency 05/20/2018   Cardiomyopathy (HCC) 07/29/2013   Asthma 02/19/2012   Hypertension 02/19/2012   Atrial fibrillation (HCC) 10/27/2011   GERD (gastroesophageal reflux disease) 10/27/2011   Hyperlipidemia 10/27/2011    Orientation RESPIRATION BLADDER Height & Weight     Self, Time, Situation, Place  Normal Continent Weight: 86.2 kg Height:  6' (182.9 cm)  BEHAVIORAL SYMPTOMS/MOOD NEUROLOGICAL BOWEL NUTRITION STATUS       Continent Diet  AMBULATORY STATUS COMMUNICATION OF NEEDS Skin   Supervision (Constant) Verbally Normal                       Personal Care Assistance Level of Assistance  Bathing, Dressing Bathing Assistance: Limited assistance   Dressing Assistance: Limited assistance     Functional Limitations Info             SPECIAL CARE FACTORS FREQUENCY  PT (By licensed PT), OT (By licensed OT)     PT Frequency: 5 X weekly OT Frequency: 5 X weekly            Contractures      Additional Factors Info                  Current Medications (03/13/2021):  This is the current hospital active medication list Current Facility-Administered Medications  Medication Dose Route Frequency Provider Last Rate Last Admin   ascorbic acid (VITAMIN C) tablet 500 mg  500 mg Oral BID Annice Needy, MD   500 mg at 03/13/21 0801   atorvastatin (LIPITOR) tablet 10 mg  10 mg Oral q1800 Annice Needy, MD   10 mg at 03/12/21 1730   carvedilol (COREG) tablet 6.25 mg  6.25 mg Oral BID Annice Needy, MD   6.25 mg at 03/13/21 0801   ceFEPIme (MAXIPIME) 2 g in sodium chloride 0.9 % 100 mL IVPB  2 g Intravenous Q12H Alford Highland, MD 200 mL/hr at 03/13/21 0803 2 g at 03/13/21 0803   digoxin (LANOXIN) tablet 0.125 mg  0.125 mg  Oral Daily Annice Needy, MD   0.125 mg at 03/13/21 0801   docusate sodium (COLACE) capsule 200 mg  200 mg Oral BID PRN Annice Needy, MD       ferrous gluconate (FERGON) tablet 324 mg  324 mg Oral BID WC Annice Needy, MD   324 mg at 03/13/21 0801   furosemide (LASIX) injection 20 mg  20 mg Intravenous Once Annice Needy, MD       gabapentin (NEURONTIN) capsule 100 mg  100 mg Oral TID Annice Needy, MD   100 mg at 03/13/21 1505   morphine 2 MG/ML injection 2 mg  2 mg Intravenous Q1H PRN Annice Needy, MD   2 mg at 03/12/21 1013   multivitamin-lutein (OCUVITE-LUTEIN) capsule 1 capsule  1 capsule Oral Daily Annice Needy, MD   1 capsule at 03/13/21 0801   ondansetron (ZOFRAN) injection 4  mg  4 mg Intravenous Q8H PRN Annice Needy, MD       oxyCODONE (Oxy IR/ROXICODONE) immediate release tablet 5-10 mg  5-10 mg Oral Q4H PRN Annice Needy, MD   10 mg at 03/12/21 2104   protein supplement (ENSURE MAX) liquid  11 oz Oral TID Annice Needy, MD   11 oz at 03/13/21 1505   vancomycin (VANCOREADY) IVPB 1500 mg/300 mL  1,500 mg Intravenous Q24H Alford Highland, MD 150 mL/hr at 03/12/21 1732 1,500 mg at 03/12/21 1732   vitamin B-12 (CYANOCOBALAMIN) tablet 1,000 mcg  1,000 mcg Oral Daily Annice Needy, MD   1,000 mcg at 03/13/21 0801     Discharge Medications: Please see discharge summary for a list of discharge medications.  Relevant Imaging Results:  Relevant Lab Results:   Additional Information AKA  Ashley Royalty Lutricia Feil, RN

## 2021-03-13 NOTE — Progress Notes (Signed)
Patient ID: [name] [name], male   DOB: [birth year], 80 y.o.   MRN: [id number] [facility] PROGRESS NOTE  [name] [name] GYK:[id number] DOB: 08-Mar-1941 DOA: 03/08/2021 PCP: Lorenso Quarry, NP  HPI/Subjective: Patient this morning was feeling okay.  Offers no complaints.  Patient's hemoglobin dropped down to 6.9 and he consented for blood transfusion.  Initially admitted with ankle wound and pain.  Objective: Vitals:   03/13/21 0321 03/13/21 0852  BP: 131/85 136/74  Pulse: 91 72  Resp: 20 16  Temp: 98.5 F (36.9 C) (!) 97.5 F (36.4 C)  SpO2: 95% (!) 88%    Intake/Output Summary (Last 24 hours) at 03/13/2021 1350 Last data filed at 03/13/2021 1000 Gross per 24 hour  Intake 1760.91 ml  Output 2000 ml  Net -239.09 ml   Filed Weights   03/08/21 1221 03/09/21 0949 03/11/21 1231  Weight: 86.2 kg 86.2 kg 86.2 kg    ROS: Review of Systems  Respiratory:  Negative for shortness of breath.   Cardiovascular:  Negative for chest pain.  Gastrointestinal:  Negative for abdominal pain, nausea and vomiting.  Exam: Physical Exam HENT:     Head: Normocephalic.     Mouth/Throat:     Pharynx: No oropharyngeal exudate.  Eyes:     General: Lids are normal.     Conjunctiva/sclera: Conjunctivae normal.  Cardiovascular:     Rate and Rhythm: Normal rate and regular rhythm.     Heart sounds: Normal heart sounds, S1 normal and S2 normal.  Pulmonary:     Breath sounds: No decreased breath sounds, wheezing, rhonchi or rales.  Abdominal:     Palpations: Abdomen is soft.     Tenderness: There is no abdominal tenderness.  Musculoskeletal:     Right lower leg: No swelling.     Left lower leg: No swelling.  Skin:    General: Skin is warm.     Findings: No rash.  Neurological:     Mental Status: He is alert and oriented to person, place, and time.      Scheduled Meds:  sodium chloride   Intravenous Once   acetaminophen  650 mg Oral Once   vitamin C  500 mg Oral BID   atorvastatin  10  mg Oral q1800   carvedilol  6.25 mg Oral BID   digoxin  0.125 mg Oral Daily   ferrous gluconate  324 mg Oral BID WC   furosemide  20 mg Intravenous Once   furosemide  20 mg Intravenous Once   gabapentin  100 mg Oral TID   multivitamin-lutein  1 capsule Oral Daily   Ensure Max Protein  11 oz Oral TID   vitamin B-12  1,000 mcg Oral Daily   Continuous Infusions:  ceFEPime (MAXIPIME) IV 2 g (03/13/21 0803)   vancomycin 1,500 mg (03/12/21 1732)    Assessment/Plan:  Acute osteomyelitis of the right ankle with open wounds.  History of PAD.  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  Pain control.  Continue antibiotics today. Acute blood loss anemia on anemia of chronic disease.  Hemoglobin 6.9 today.  We will give a unit of packed red blood cells today.  Already received a unit of packed red blood cells during the hospital course. Leukocytosis.  White blood cell count still high. Lactic acidosis secondary to infection Hyponatremia has resolved Chronic diastolic congestive heart failure.  Echocardiogram as outpatient showed a normal EF.  Continue Coreg and Lasix Hyperlipidemia  unspecified on atorvastatin Chronic atrial fibrillation.  Hopefully will be able to go back on Xarelto tomorrow Moderate malnutrition    Code Status:     Code Status Orders  (From admission, onward)           Start     Ordered   03/08/21 2339  Full code  Continuous        03/08/21 2338           Code Status History     Date Active Date Inactive Code Status Order ID Comments User Context   02/10/2021 1214 02/10/2021 1924 Full Code [id number]  Annice Needy, MD Inpatient      Family Communication: Updated patient's wife on the phone Disposition Plan: Status is: Inpatient  Antibiotics: Vancomycin and cefepime.  Will stop antibiotics after today  Time spent: 28 minutes  Brizeida [employer]

## 2021-03-14 ENCOUNTER — Encounter: Payer: Medicare HMO | Admitting: Physician Assistant

## 2021-03-14 ENCOUNTER — Encounter: Payer: Self-pay | Admitting: Vascular Surgery

## 2021-03-14 DIAGNOSIS — D62 Acute posthemorrhagic anemia: Secondary | ICD-10-CM | POA: Diagnosis not present

## 2021-03-14 DIAGNOSIS — D72829 Elevated white blood cell count, unspecified: Secondary | ICD-10-CM | POA: Diagnosis not present

## 2021-03-14 DIAGNOSIS — M86171 Other acute osteomyelitis, right ankle and foot: Secondary | ICD-10-CM | POA: Diagnosis not present

## 2021-03-14 DIAGNOSIS — D638 Anemia in other chronic diseases classified elsewhere: Secondary | ICD-10-CM | POA: Diagnosis not present

## 2021-03-14 LAB — CBC
HCT: 23.1 % — ABNORMAL LOW (ref 39.0–52.0)
Hemoglobin: 7.9 g/dL — ABNORMAL LOW (ref 13.0–17.0)
MCH: 27.1 pg (ref 26.0–34.0)
MCHC: 34.2 g/dL (ref 30.0–36.0)
MCV: 79.1 fL — ABNORMAL LOW (ref 80.0–100.0)
Platelets: 236 10*3/uL (ref 150–400)
RBC: 2.92 MIL/uL — ABNORMAL LOW (ref 4.22–5.81)
RDW: 15.5 % (ref 11.5–15.5)
WBC: 12.9 10*3/uL — ABNORMAL HIGH (ref 4.0–10.5)
nRBC: 0.2 % (ref 0.0–0.2)

## 2021-03-14 LAB — TYPE AND SCREEN
ABO/RH(D): A POS
Antibody Screen: NEGATIVE
Unit division: 0

## 2021-03-14 LAB — BPAM RBC
Blood Product Expiration Date: 202212082359
ISSUE DATE / TIME: 202211201444
Unit Type and Rh: 6200

## 2021-03-14 LAB — CREATININE, SERUM
Creatinine, Ser: 0.98 mg/dL (ref 0.61–1.24)
GFR, Estimated: >60 mL/min (ref >60)

## 2021-03-14 MED ORDER — RIVAROXABAN 20 MG PO TABS
20.0000 mg | ORAL_TABLET | Freq: Every day | ORAL | Status: DC
  Administered 2021-03-14 – 2021-03-15 (×2): 20 mg via ORAL
  Filled 2021-03-14 (×3): qty 1

## 2021-03-14 NOTE — Progress Notes (Signed)
[facility] Daily Progress Note   Subjective: Patient without complaint this AM.  No acute issues overnight.  Objective: Vitals:   03/13/21 1617 03/13/21 1939 03/14/21 0354 03/14/21 0851  BP: (!) 142/79 (!) 140/115 121/61 126/68  Pulse: 90 89 70 74  Resp: 20 15 16 16   Temp: 98.9 F (37.2 C) 98.8 F (37.1 C) 98.9 F (37.2 C) 98 F (36.7 C)  TempSrc: Oral Oral  Oral  SpO2: 100% 100% 99% 98%  Weight:      Height:        Intake/Output Summary (Last 24 hours) at 03/14/2021 1453 Last data filed at 03/14/2021 1415 Gross per 24 hour  Intake 1903.67 ml  Output 5400 ml  Net -3496.33 ml   Physical Exam: A&Ox3, NAD CV: RRR Pulmonary: CTA Bilaterally Abdomen: Soft, Nontender, Nondistended Vascular:  Right lower extremity: Thigh soft.  Operating room dressing removed.  Stump is healthy.  No drainage.  Staples are clean dry and intact.   Laboratory: CBC    Component Value Date/Time   WBC 12.9 (H) 03/14/2021 0330   HGB 7.9 (L) 03/14/2021 0330   HGB 14.7 02/24/2012 0325   HCT 23.1 (L) 03/14/2021 0330   HCT 44.4 02/24/2012 0325   PLT 236 03/14/2021 0330   PLT 108 (L) 02/24/2012 0325   BMET    Component Value Date/Time   NA 136 03/11/2021 0446   NA 143 02/24/2012 0325   K 4.3 03/11/2021 0446   K 4.1 02/24/2012 0325   CL 106 03/11/2021 0446   CL 108 (H) 02/24/2012 0325   CO2 25 03/11/2021 0446   CO2 26 02/24/2012 0325   GLUCOSE 107 (H) 03/11/2021 0446   GLUCOSE 120 (H) 02/24/2012 0325   BUN 33 (H) 03/11/2021 0446   BUN 25 (H) 02/24/2012 0325   CREATININE 0.98 03/14/2021 0330   CREATININE 1.47 (H) 02/24/2012 0325   CALCIUM 7.9 (L) 03/11/2021 0446   CALCIUM 9.1 02/24/2012 0325   GFRNONAA >60 03/14/2021 0330   GFRNONAA 48 (L) 02/24/2012 0325   GFRAA 55 (L) 02/24/2012 0325   Assessment/Planning: The patient is a 80 year old male who presented to the [facility] emergency department with severe infection into the right foot  as well as sepsis status 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  Stump is healthy and healing well.  Staples are clean dry and intact 2) Kerlix dressings to stump daily 3) okay from a vascular standpoint to be discharged home when medically stable  Discussed with Dr. [facility] Ajai Harville PA-C 03/14/2021 2:53 PM

## 2021-03-14 NOTE — Care Management Important Message (Signed)
Important Message  Patient Details  Name: [name] [name] MRN: [id number] Date of Birth: December 28, 1940   Medicare Important Message Given:  Yes     [name] 03/14/2021, 5:06 PM

## 2021-03-14 NOTE — Progress Notes (Incomplete)
Occupational Therapy Treatment Patient Details Name: [name] [name] MRN: [id number] DOB: [birth year] Today's Date: 03/14/2021   History of present illness [name] [name] is a 80 y/o M who comes to [facility] on 11/15 presents with sepsis from an infected ulceration of the right foot and ankle. 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 PTA pt was mostly homebound, trying to stay off his foot wound, using RW. 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 Wyn Quaker. Pt with continue on transfer orders.   OT comments  Pt seen for OT RE- Evaluation on this date. Upon arrival to room pt *awake/alert/easily awoken. Pt seated upright in *bed with *family/caregiver present. Pt *A&O x 4 reporting ***pain. Pt agreeable to tx. Pt instructed in ****. Requiring *** assist for *** ADL tasks. Pt *verbalized understanding /return demonstrated of instruction provided. Pt making good progress toward goals. Pt continues to benefit from skilled OT services to maximize return to PLOF and minimize risk of future falls, injury, caregiver burden, and readmission. Will continue to follow POC. Discharge recommendation remains appropriate.     Recommendations for follow up therapy are one component of a multi-disciplinary discharge planning process, led by the attending physician.  Recommendations may be updated based on patient status, additional functional criteria and insurance authorization.    Follow Up Recommendations  Acute inpatient rehab (3hours/day)    Assistance Recommended at Discharge Intermittent Supervision/Assistance  Equipment Recommendations  Other (comment) (defer to next venue of care)    Recommendations for Other Services      Precautions / Restrictions Precautions Precautions: Fall Restrictions Weight Bearing Restrictions: Yes RLE Weight Bearing: Non weight bearing       Mobility Bed  Mobility Overal bed mobility: Needs Assistance Bed Mobility: Supine to Sit     Supine to sit: Min assist     General bed mobility comments: assist for postioning body at EOB    Transfers Overall transfer level: Needs assistance Equipment used: Rolling walker (2 wheels) Transfers: Sit to/from Stand;Bed to chair/wheelchair/BSC Sit to Stand: Min assist (X6 stands)     Squat pivot transfers: Max assist;Mod assist (MAX bed to BSC, MOD BSC to chair)           Balance Overall balance assessment: Needs assistance Sitting-balance support: Feet supported;No upper extremity supported Sitting balance-Leahy Scale: Fair     Standing balance support: Bilateral upper extremity supported Standing balance-Leahy Scale: Poor                             ADL either performed or assessed with clinical judgement   ADL Overall ADL's : Needs assistance/impaired                                       General ADL Comments: MAX A for perihygeine. MAX A for t/f bed to [facility], MOD A for t/f BSC to chair.    Extremity/Trunk Assessment              Vision       Perception     Praxis      Cognition Arousal/Alertness: Awake/alert Behavior During Therapy: WFL for tasks assessed/performed Overall Cognitive Status: Within Functional Limits for tasks assessed  Exercises Exercises: Other exercises Other Exercises Other Exercises: Pt educ re: OT role, d/c recs, falls prevention, DME use Other Exercises: Sup<>sit, sit<>stand, BSC t/f, BM, perihygiene, MD in room during tx   Shoulder Instructions       General Comments      Pertinent Vitals/ Pain       Pain Assessment: No/denies pain  Home Living                                          Prior Functioning/Environment              Frequency  Min 3X/week        Progress Toward Goals  OT Goals(current goals can now be  found in the care plan section)  Progress towards OT goals: Progressing toward goals  Acute Rehab OT Goals Patient Stated Goal: to go home OT Goal Formulation: With patient Time For Goal Achievement: 03/28/21 Potential to Achieve Goals: Good ADL Goals Pt Will Perform Grooming: with min assist;standing Pt Will Perform Lower Body Dressing: sit to/from stand;with set-up;with supervision Pt Will Transfer to Toilet: with min assist;stand pivot transfer;bedside commode  Plan Discharge plan remains appropriate    Co-evaluation                 AM-PAC OT "6 Clicks" Daily Activity     Outcome Measure   Help from another person eating meals?: None Help from another person taking care of personal grooming?: A Lot Help from another person toileting, which includes using toliet, bedpan, or urinal?: A Lot Help from another person bathing (including washing, rinsing, drying)?: A Lot Help from another person to put on and taking off regular upper body clothing?: None Help from another person to put on and taking off regular lower body clothing?: A Lot 6 Click Score: 16    End of Session Equipment Utilized During Treatment: Rolling walker (2 wheels);Gait belt  OT Visit Diagnosis: Other abnormalities of gait and mobility (R26.89);Muscle weakness (generalized) (M62.81)   Activity Tolerance Patient tolerated treatment well   Patient Left in chair;with call bell/phone within reach;with family/visitor present   Nurse Communication          Time: 5974-1638 OT Time Calculation (min): 48 min  Charges:    {enter signature dotphrase here}  [place] [facility] 03/14/2021, 11:45 AM

## 2021-03-14 NOTE — Discharge Instructions (Addendum)
Vascular Surgery Discharge Instructions:  1) You may shower. Gently clean your stump with soap and water. Gently pat dry. 2) Daily dressing changes: - ABD to staple line, covered with Kerlix, covered with Ace.

## 2021-03-14 NOTE — Plan of Care (Signed)

## 2021-03-14 NOTE — Progress Notes (Signed)
Physical Therapy Treatment Patient Details Name: [name] [name] MRN: [id number] DOB: [birth year] Today's Date: 03/14/2021   History of Present Illness [name] [name] is a 80 y/o M who comes to [facility] on 11/15 presents with sepsis from an infected ulceration of the right foot and ankle. 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 PTA pt was mostly homebound, trying to stay off his foot wound, using RW. 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 Wyn Quaker. Pt with continue on transfer orders.    PT Comments    Pt up in chair for several hours today and tolerates well.  He does state he feels as if he has had a BM in chair.  Tech in to assist with standing and care.  Stood in a semi squat/stand position for care as he was hesitant to transition fully to walker.  Once care is completed tech is available to assist with transition and he stands generally unsteady to walker.  With time he is able to take several unsteady poor quality hops to bedside where he does sit somewhat quickly before turning fully to bed.  He reports fatigue and education is provided on safety.  Lateral scoot transfers are initiated to drop arm recliner at bedside.  While pt has excellent arm strength he has some trouble following instructions and demo for technique and puts LE up on recliner and tries to scoot forward onto chair and not a lateral pivot.  He c/o discomfort in stump from pressure and pt was instructed to scoot back onto bed.  Pt does all movements with min guard/assist.  With further instruction and practice, anticipate pt will be able to complete lateral scoot/squat pivot to chair with ease.  He will need +2 for attempts at transfer with walker and +1 with stand pivot per OT notes.  Pt remains highly motivated and thankful for interventions given.  CIR remains recommended at this time.  If not available he will need SNF  upon discharge.   Recommendations for follow up therapy are one component of a multi-disciplinary discharge planning process, led by the attending physician.  Recommendations may be updated based on patient status, additional functional criteria and insurance authorization.  Follow Up Recommendations  Acute inpatient rehab (3hours/day)     Assistance Recommended at Discharge Frequent or constant Supervision/Assistance  Equipment Recommendations       Recommendations for Other Services       Precautions / Restrictions Precautions Precautions: Fall Restrictions Weight Bearing Restrictions: Yes RLE Weight Bearing: Non weight bearing Other Position/Activity Restrictions: per secure chat from vascular     Mobility  Bed Mobility Overal bed mobility: Needs Assistance Bed Mobility: Sit to Supine     Supine to sit: Min assist Sit to supine: Min guard   General bed mobility comments: assist for scooting towards EOB    Transfers Overall transfer level: Needs assistance Equipment used: Rolling walker (2 wheels) Transfers: Sit to/from Stand;Bed to chair/wheelchair/BSC Sit to Stand: Min assist;+2 physical assistance Stand pivot transfers: Mod assist        Lateral/Scoot Transfers: Min assist General transfer comment: difficulty following cues for lateral scoot transfers but has arm strength and potential to complete with set up assist    Ambulation/Gait               General Gait Details: poor quality hops during transfer recliner to  bed   Stairs             Wheelchair Mobility    Modified Rankin (Stroke Patients Only)       Balance Overall balance assessment: Needs assistance Sitting-balance support: Feet supported;No upper extremity supported Sitting balance-Leahy Scale: Good     Standing balance support: Bilateral upper extremity supported Standing balance-Leahy Scale: Poor                              Cognition Arousal/Alertness:  Awake/alert Behavior During Therapy: WFL for tasks assessed/performed Overall Cognitive Status: Within Functional Limits for tasks assessed                                          Exercises Other Exercises Other Exercises: supine arom BLE for available ranges and bridges with LLE x 20 Other Exercises: Sup<>sit, sit<>stand, BSC t/f, BM, perihygiene, MD in room during tx    General Comments        Pertinent Vitals/Pain Pain Assessment: Faces Faces Pain Scale: Hurts little more Pain Location: with attempts at lateral scoot transfers Pain Descriptors / Indicators: Discomfort;Grimacing Pain Intervention(s): Limited activity within patient's tolerance;Monitored during session;Repositioned    Home Living                          Prior Function            PT Goals (current goals can now be found in the care plan section) Progress towards PT goals: Progressing toward goals    Frequency    7X/week      PT Plan Current plan remains appropriate    Co-evaluation              AM-PAC PT "6 Clicks" Mobility   Outcome Measure  Help needed turning from your back to your side while in a flat bed without using bedrails?: A Little Help needed moving from lying on your back to sitting on the side of a flat bed without using bedrails?: A Little Help needed moving to and from a bed to a chair (including a wheelchair)?: A Lot Help needed standing up from a chair using your arms (e.g., wheelchair or bedside chair)?: A Lot Help needed to walk in hospital room?: Total Help needed climbing 3-5 steps with a railing? : Total 6 Click Score: 12    End of Session Equipment Utilized During Treatment: Gait belt Activity Tolerance: Patient tolerated treatment well Patient left: in bed;with call bell/phone within reach;with bed alarm set Nurse Communication: Mobility status PT Visit Diagnosis: Unsteadiness on feet (R26.81);Other abnormalities of gait and  mobility (R26.89);Difficulty in walking, not elsewhere classified (R26.2);Muscle weakness (generalized) (M62.81)     Time: 0539-7673 PT Time Calculation (min) (ACUTE ONLY): 25 min  Charges:  $Therapeutic Exercise: 8-22 mins $Therapeutic Activity: 8-22 mins                    Danielle Dess, PTA 03/14/21, 2:00 PM

## 2021-03-14 NOTE — PMR Pre-admission (Shared)
PMR Admission Coordinator Pre-Admission Assessment  Patient: [name] [name] is an 80 y.o., male MRN: [id number] DOB: 10/14/1940 Height: 6' (182.9 cm) Weight: 86.2 kg  Insurance Information HMO: ***    PPO: ***     PCP: ***     IPA: ***     80/20: ***     OTHER: *** PRIMARY: [employer]      Policy#: ***      Subscriber: *** CM Name: ***      Phone#: ***     Fax#: *** Pre-Cert#: ***      Employer: *** Benefits:  Phone #: ***     Name: *** Eff. Date: ***     Deduct: ***      Out of Pocket Max: ***      Life Max: *** CIR: ***      SNF: *** Outpatient: ***     Co-Pay: *** Home Health: ***      Co-Pay: *** DME: ***     Co-Pay: *** Providers: *** SECONDARY: ***      Policy#: ***     Phone#: ***  Financial Counselor: ***      Phone#: ***  The Data Collection Information Summary for patients in Inpatient Rehabilitation Facilities with attached Privacy Act Statement-Health Care Records was provided and verbally reviewed with: Patient  Emergency Contact Information Contact Information     Name Relation Home Work Mobile   [name] [name] Spouse [phone]     [name] [name]   [phone]       Current Medical History  Patient Admitting Diagnosis: AKA History of Present Illness: [name] [name] is a 80 y/o M who comes to [facility] on 11/15 presents with sepsis from an infected ulceration of the right foot and ankle. 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 PTA pt was mostly homebound, trying to stay off his foot wound, using RW. 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 Wyn Quaker    Patient's medical record from [facility]  has been reviewed by the rehabilitation admission coordinator and physician.  Past Medical History  Past Medical History:  Diagnosis Date   CHF (congestive heart failure) (HCC)    Diabetes (HCC)    Dysrhythmia    AFIB    HTN (hypertension)    Infection    LEG   Pancreatitis     Has the patient had major surgery during 100 days prior to admission? Yes  Family History   family history is not on file.  Current Medications  Current Facility-Administered Medications:    ascorbic acid (VITAMIN C) tablet 500 mg, 500 mg, Oral, BID, Dew, Marlow Baars, MD, 500 mg at 03/14/21 0916   atorvastatin (LIPITOR) tablet 10 mg, 10 mg, Oral, q1800, Annice Needy, MD, 10 mg at 03/13/21 1816   carvedilol (COREG) tablet 6.25 mg, 6.25 mg, Oral, BID, Dew, Marlow Baars, MD, 6.25 mg at 03/14/21 0915   digoxin (LANOXIN) tablet 0.125 mg, 0.125 mg, Oral, Daily, Dew, Marlow Baars, MD, 0.125 mg at 03/14/21 0916   docusate sodium (COLACE) capsule 200 mg, 200 mg, Oral, BID PRN, Annice Needy, MD   ferrous gluconate (FERGON) tablet 324 mg, 324 mg, Oral, BID WC, Annice Needy, MD, 324 mg at 03/14/21 0916   furosemide (LASIX) injection 20 mg, 20 mg, Intravenous, Once, Dew, Marlow Baars, MD   gabapentin (  NEURONTIN) capsule 100 mg, 100 mg, Oral, TID, Wyn Quaker, Marlow Baars, MD, 100 mg at 03/14/21 0915   morphine 2 MG/ML injection 2 mg, 2 mg, Intravenous, Q1H PRN, Annice Needy, MD, 2 mg at 03/12/21 1013   multivitamin-lutein (OCUVITE-LUTEIN) capsule 1 capsule, 1 capsule, Oral, Daily, Dew, Marlow Baars, MD, 1 capsule at 03/14/21 0916   ondansetron (ZOFRAN) injection 4 mg, 4 mg, Intravenous, Q8H PRN, Wyn Quaker, Marlow Baars, MD   oxyCODONE (Oxy IR/ROXICODONE) immediate release tablet 5-10 mg, 5-10 mg, Oral, Q4H PRN, Annice Needy, MD, 10 mg at 03/12/21 2104   protein supplement (ENSURE MAX) liquid, 11 oz, Oral, TID, Dew, Marlow Baars, MD, 11 oz at 03/14/21 2800   rivaroxaban (XARELTO) tablet 20 mg, 20 mg, Oral, Q supper, Lowella Bandy, RPH   vitamin [name]-12 (CYANOCOBALAMIN) tablet 1,000 mcg, 1,000 mcg, Oral, Daily, Dew, Marlow Baars, MD, 1,000 mcg at 03/14/21 3491  Patients Current Diet:  Diet Order             Diet regular Room service appropriate? Yes; Fluid consistency: Thin  Diet effective now                    Precautions / Restrictions Precautions Precautions: Fall Restrictions Weight Bearing Restrictions: Yes RLE Weight Bearing: Non weight bearing Other Position/Activity Restrictions: per secure chat from vascular   Has the patient had 2 or more falls or a fall with injury in the past year? Yes  Prior Activity Level Limited Community (1-2x/wk): 1-2x week  Prior Functional Level Self Care: Did the patient need help bathing, dressing, using the toilet or eating? Independent  Indoor Mobility: Did the patient need assistance with walking from room to room (with or without device)? Independent  Stairs: Did the patient need assistance with internal or external stairs (with or without device)? Independent  Functional Cognition: Did the patient need help planning regular tasks such as shopping or remembering to take medications? Needed some help  Patient Information Are you of Hispanic, Latino/a,or Spanish origin?: A. No, not of Hispanic, Latino/a, or Spanish origin What is your race?: [name]. Black or African American Do you need or want an interpreter to communicate with a doctor or health care staff?: 0. No  Patient's Response To:  Health Literacy and Transportation Is the patient able to respond to health literacy and transportation needs?: Yes Health Literacy - How often do you need to have someone help you when you read instructions, pamphlets, or other written material from your doctor or pharmacy?: Never In the past 12 months, has lack of transportation kept you from medical appointments or from getting medications?: No In the past 12 months, has lack of transportation kept you from meetings, work, or from getting things needed for daily living?: No  Home Assistive Devices / Equipment Home Assistive Devices/Equipment: Environmental consultant (specify type), Cane (specify quad or straight), Wheelchair Home Equipment: Agricultural consultant (2 wheels)  Prior Device Use: Indicate devices/aids  used by the patient prior to current illness, exacerbation or injury? None of the above  Current Functional Level Cognition  Overall Cognitive Status: Within Functional Limits for tasks assessed Orientation Level: Oriented X4 General Comments: question pt's hearing - reports neighbours house setup    Extremity Assessment (includes Sensation/Coordination)  Upper Extremity Assessment: Generalized weakness  Lower Extremity Assessment: RLE deficits/detail RLE Deficits / Details: C/D/I dressing    ADLs  Overall ADL's : Needs assistance/impaired General ADL Comments: MAX A for perihygeine. MAX A for t/f bed to [facility],  MOD A for t/f BSC to chair.    Mobility  Overal bed mobility: Needs Assistance Bed Mobility: Supine to Sit Supine to sit: Min assist General bed mobility comments: assist for postioning body at EOB    Transfers  Overall transfer level: Needs assistance Equipment used: Rolling walker (2 wheels) Transfers: Sit to/from Stand, Bed to chair/wheelchair/BSC Sit to Stand: Min assist (X6 stands) Bed to/from chair/wheelchair/BSC transfer type:: Squat pivot Stand pivot transfers: Max assist (Pt able to pivot LLE towards recliner, does attempt to weight bear through BUE to take weight off LLE to advance it, has extremely poor eccentric lowering & requires PT assistance to safely lower to chair) Squat pivot transfers: Max assist, Mod assist (MAX bed to BSC, MOD BSC to chair) General transfer comment: Pt is able to transfer sit>Stand from low recliner with mod/max assist for balance, tolerates standing for a max of ~10 seconds before returning to sitting.    Ambulation / Gait / Stairs / Wheelchair Mobility  Ambulation/Gait Ambulation/Gait assistance:  (deferred at this time.)    Posture / Balance Dynamic Sitting Balance Sitting balance - Comments: close supervision static sitting Balance Overall balance assessment: Needs assistance Sitting-balance support: Feet supported, No upper  extremity supported Sitting balance-Leahy Scale: Fair Sitting balance - Comments: close supervision static sitting Standing balance support: Bilateral upper extremity supported Standing balance-Leahy Scale: Poor Standing balance comment: BUE support on RW with mod/max assist during stand pivot transfer    Special needs/care consideration Skin *** and Diabetic management ***   Previous Home Environment (from acute therapy documentation) Living Arrangements: Spouse/significant other, Children, Other relatives (Wife, Son, 2 teenage granddaughters)  Lives With: Spouse Available Help at Discharge: Family Type of Home: House Home Layout: One level Home Access: Ramped entrance Bathroom Shower/Tub: Engineer, manufacturing systems: Standard Bathroom Accessibility: Yes How Accessible: Accessible via walker Home Care Services: No Additional Comments: was using his brother's walker, mostly homebound last couple months trying to keep weight off foot; Pt says he was sleeping on the couch for last ~2 weeks due to fatigue  Discharge Living Setting Plans for Discharge Living Setting: Patient's home Type of Home at Discharge: House Discharge Home Layout: One level Discharge Home Access: Ramped entrance Discharge Bathroom Shower/Tub: Tub/shower unit Discharge Bathroom Toilet: Handicapped height Discharge Bathroom Accessibility: Yes How Accessible: Accessible via walker Does the patient have any problems obtaining your medications?: No  Social/Family/Support Systems Patient Roles: Spouse Contact Information: [phone] Anticipated Caregiver: Anne Anticipated Caregiver's Contact Information: [phone] Caregiver Availability: 24/7 Discharge Plan Discussed with Primary Caregiver: Yes Is Caregiver In Agreement with Plan?: No Does Caregiver/Family have Issues with Lodging/Transportation while Pt is in Rehab?: Yes  Goals Patient/Family Goal for Rehab: PT/OT/SLP Min A Expected length of stay:  16-18 days Pt/Family Agrees to Admission and willing to participate: Yes Program Orientation Provided & Reviewed with Pt/Caregiver Including Roles  & Responsibilities: Yes  Decrease burden of Care through IP rehab admission: Decrease number of caregivers, Bowel and bladder program, and Patient/family education  Possible need for SNF placement upon discharge: not anticipated   Patient Condition: I have reviewed medical records from [facility], spoken with {CHL IP CSW HR:[id number]}, and patient and spouse. I discussed via phone for inpatient rehabilitation assessment.  Patient will benefit from ongoing PT and OT, can actively participate in 3 hours of therapy a day 5 days of the week, and can make measurable gains during the admission.  Patient will also benefit from the coordinated team approach during an  Inpatient Acute Rehabilitation admission.  The patient will receive intensive therapy as well as Rehabilitation physician, nursing, social worker, and care management interventions.  Due to safety, skin/wound care, disease management, medication administration, pain management, and patient education the patient requires 24 hour a day rehabilitation nursing.  The patient is currently *** with mobility and basic ADLs.  Discharge setting and therapy post discharge at home with home health is anticipated.  Patient has agreed to participate in the Acute Inpatient Rehabilitation Program and will admit {Time; today/tomorrow:10263}.  Preadmission Screen Completed By:  Jeronimo Greaves, 03/14/2021 12:22 PM ______________________________________________________________________   Discussed status with Dr. Marland Kitchen on *** at *** and received approval for admission today.  Admission Coordinator:  Jeronimo Greaves, CCC-SLP, time Marland KitchenDorna Bloom ***   Assessment/Plan: Diagnosis: Does the need for close, 24 hr/day Medical supervision in concert with the patient's rehab needs make it unreasonable for this patient  to be served in a less intensive setting? {yes_no_potentially:3041433} Co-Morbidities requiring supervision/potential complications: *** Due to {due SM:2707867}, does the patient require 24 hr/day rehab nursing? {yes_no_potentially:3041433} Does the patient require coordinated care of a physician, rehab nurse, PT, OT, and SLP to address physical and functional deficits in the context of the above medical diagnosis(es)? {yes_no_potentially:3041433} Addressing deficits in the following areas: {deficits:3041436} Can the patient actively participate in an intensive therapy program of at least 3 hrs of therapy 5 days a week? {yes_no_potentially:3041433} The potential for patient to make measurable gains while on inpatient rehab is {potential:3041437} Anticipated functional outcomes upon discharge from inpatient rehab: {functional outcomes:304600100} PT, {functional outcomes:304600100} OT, {functional outcomes:304600100} SLP Estimated rehab length of stay to reach the above functional goals is: *** Anticipated discharge destination: {anticipated dc setting:21604} 10. Overall Rehab/Functional Prognosis: {potential:3041437}   MD Signature: ***

## 2021-03-14 NOTE — Progress Notes (Signed)
Occupational Therapy Treatment Patient Details Name: [name] [name] MRN: [id number] DOB: 23-Aug-1940 Today's Date: 03/14/2021   History of present illness [name] [name] is a 80 y/o M who comes to [facility] on 11/15 presents with sepsis from an infected ulceration of the right foot and ankle. 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 PTA pt was mostly homebound, trying to stay off his foot wound, using RW. 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 Wyn Quaker. Pt with continue on transfer orders.   OT comments  Mr [name] [name] was seen for OT treatment on this date. Upon arrival to room pt reclined in bed, wife at bedside, pt reporting need for BM. Initial requires MAX A for SPT bed>BSC improving to MOD A BSC>chair with use of B arm rests. Pt tolerated x6 standing trials from [facility] to facilitate BM as pt reports feeling constipated (RN notified), requires MIN A for standing at [facility], assist to stabilize RW. MAX A perihygeine in standing. SETUP self-drinking seated in chair. Pt and wife instructed in d/c recs, DME recs, HEP, and falls prevention. Pt making good progress toward goals. Pt continues to benefit from skilled OT services to maximize return to PLOF and minimize risk of future falls, injury, caregiver burden, and readmission. Will continue to follow POC. Discharge recommendation remains appropriate.     Recommendations for follow up therapy are one component of a multi-disciplinary discharge planning process, led by the attending physician.  Recommendations may be updated based on patient status, additional functional criteria and insurance authorization.    Follow Up Recommendations  Acute inpatient rehab (3hours/day)    Assistance Recommended at Discharge Intermittent Supervision/Assistance  Equipment Recommendations  Other (comment) (defer to next venue of care)    Recommendations for Other  Services      Precautions / Restrictions Precautions Precautions: Fall Restrictions Weight Bearing Restrictions: Yes RLE Weight Bearing: Non weight bearing       Mobility Bed Mobility Overal bed mobility: Needs Assistance Bed Mobility: Supine to Sit     Supine to sit: Min assist     General bed mobility comments: assist for scooting towards EOB    Transfers Overall transfer level: Needs assistance Equipment used: Rolling walker (2 wheels) Transfers: Sit to/from Stand;Bed to chair/wheelchair/BSC Sit to Stand: Min assist (X6 stands) Stand pivot transfers: Mod assist         General transfer comment: initial MAX A bed>BSC improving to MOD A BSC>chair with use of B arm rests     Balance Overall balance assessment: Needs assistance Sitting-balance support: Feet supported;No upper extremity supported Sitting balance-Leahy Scale: Good     Standing balance support: Bilateral upper extremity supported Standing balance-Leahy Scale: Fair                             ADL either performed or assessed with clinical judgement   ADL Overall ADL's : Needs assistance/impaired                                       General ADL Comments: MAX A bed>BSC improving to MOD A BSC>bed with use of B arm rests for standing. MAX A perihygeine in standing across multiple standing trials to facilitate BM at [facility]. SETUP self-drinking seated  in chair.      Cognition Arousal/Alertness: Awake/alert Behavior During Therapy: WFL for tasks assessed/performed Overall Cognitive Status: Within Functional Limits for tasks assessed                                            Exercises Exercises: Other exercises Other Exercises Other Exercises: Pt educ re: OT role, d/c recs, falls prevention, DME use Other Exercises: Sup<>sit, sit<>stand, BSC t/f, BM, perihygiene, MD in room during tx           Pertinent Vitals/ Pain       Pain Assessment: No/denies  pain   Frequency  Min 3X/week        Progress Toward Goals  OT Goals(current goals can now be found in the care plan section)  Progress towards OT goals: Progressing toward goals  Acute Rehab OT Goals Patient Stated Goal: to go home OT Goal Formulation: With patient Time For Goal Achievement: 03/24/21 Potential to Achieve Goals: Good ADL Goals Pt Will Perform Grooming: with min assist;standing Pt Will Perform Lower Body Dressing: sit to/from stand;with set-up;with supervision Pt Will Transfer to Toilet: with min assist;stand pivot transfer;bedside commode  Plan Discharge plan remains appropriate    Co-evaluation                 AM-PAC OT "6 Clicks" Daily Activity     Outcome Measure   Help from another person eating meals?: None Help from another person taking care of personal grooming?: A Lot Help from another person toileting, which includes using toliet, bedpan, or urinal?: A Lot Help from another person bathing (including washing, rinsing, drying)?: A Lot Help from another person to put on and taking off regular upper body clothing?: None Help from another person to put on and taking off regular lower body clothing?: A Lot 6 Click Score: 16    End of Session Equipment Utilized During Treatment: Rolling walker (2 wheels);Gait belt  OT Visit Diagnosis: Other abnormalities of gait and mobility (R26.89);Muscle weakness (generalized) (M62.81)   Activity Tolerance Patient tolerated treatment well   Patient Left in chair;with call bell/phone within reach;with family/visitor present   Nurse Communication          Time: 6712-4580 OT Time Calculation (min): 48 min  Charges: OT General Charges $OT Visit: 1 Visit OT Treatments $Self Care/Home Management : 38-52 mins  Kathie Dike, M.S. OTR/L  03/14/21, 12:54 PM  ascom [phone]

## 2021-03-14 NOTE — Progress Notes (Signed)
Patient ID: [name] [name], male   DOB: 08-29-1940, 80 y.o.   MRN: [id number] [facility] PROGRESS NOTE  [name] [name] [id number]:[id number] DOB: [birth year] DOA: 03/08/2021 PCP: Lorenso Quarry, NP  HPI/Subjective: Patient feels okay.  Offers no complaints.  I was in the room with physical therapy getting him up to the commode.  Took help to have him stand.  He was able to pivot his 1 leg in order to sit on the commode.  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  Objective: Vitals:   03/14/21 0354 03/14/21 0851  BP: 121/61 126/68  Pulse: 70 74  Resp: 16 16  Temp: 98.9 F (37.2 C) 98 F (36.7 C)  SpO2: 99% 98%    Intake/Output Summary (Last 24 hours) at 03/14/2021 1449 Last data filed at 03/14/2021 1415 Gross per 24 hour  Intake 1903.67 ml  Output 5400 ml  Net -3496.33 ml   Filed Weights   03/08/21 1221 03/09/21 0949 03/11/21 1231  Weight: 86.2 kg 86.2 kg 86.2 kg    ROS: Review of Systems  Respiratory:  Negative for shortness of breath.   Cardiovascular:  Negative for chest pain.  Gastrointestinal:  Negative for abdominal pain, nausea and vomiting.  Exam: Physical Exam HENT:     Head: Normocephalic.     Mouth/Throat:     Pharynx: No oropharyngeal exudate.  Eyes:     General: Lids are normal.     Conjunctiva/sclera: Conjunctivae normal.  Cardiovascular:     Rate and Rhythm: Normal rate and regular rhythm.     Heart sounds: Normal heart sounds, S1 normal and S2 normal.  Pulmonary:     Breath sounds: No decreased breath sounds, wheezing, rhonchi or rales.  Abdominal:     Palpations: Abdomen is soft.     Tenderness: There is no abdominal tenderness.  Musculoskeletal:     Left lower leg: No swelling.  Skin:    General: Skin is warm.     Findings: No rash.  Neurological:     Mental Status: He is alert and oriented to person, place, and time.      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  Antibiotics discontinued yesterday.  Vascular surgery cleared to be discharged when bed available. Acute blood loss anemia on anemia of chronic disease.  Patient had a blood transfusion yesterday on a hemoglobin of 6.9.  Good response to 7.9.  Patient did have a unit of packed red blood cells earlier in the hospital stay also. Leukocytosis.  White cell count coming down.  12.9 today. Lactic acidosis on presentation secondary to infection Chronic diastolic congestive heart failure.  EF as outpatient was normal.  Continue Coreg and Lasix Hyperlipidemia unspecified on atorvastatin Chronic atrial fibrillation.  Restart Xarelto Moderate malnutrition Hyponatremia on  presentation has resolved     Code Status:     Code Status Orders  (From admission, onward)           Start     Ordered   03/08/21 2339  Full code  Continuous        03/08/21 2338           Code Status History     Date Active Date Inactive Code Status Order ID Comments User Context   02/10/2021 1214 02/10/2021 1924 Full Code [id number]  Annice Needy, MD Inpatient      Family Communication: Wife at bedside Disposition Plan: Status is: Inpatient.  Can go to acute rehab if bed available otherwise subacute rehab.  Consultants: Vascular surgery  Procedures: Right AKA  Time spent: 28 minutes  [name] [name] [employer]

## 2021-03-14 NOTE — Progress Notes (Signed)
Inpatient Rehab Admissions Coordinator:   I do not have a bed for this Pt. On CIR today. I will reach out to discuss potential admit with pt. And family today. Note recent PT session limited due to pending blood transfusion. He will need updated therapy notes demonstrating ability to participate before I can submit for insurance auth.   Megan Salon, MS, CCC-SLP Rehab Admissions Coordinator  [phone] (celll) [phone] (office)

## 2021-03-15 ENCOUNTER — Ambulatory Visit: Payer: Medicare HMO | Admitting: Physician Assistant

## 2021-03-15 ENCOUNTER — Encounter: Payer: Medicare HMO | Admitting: Physician Assistant

## 2021-03-15 DIAGNOSIS — D72829 Elevated white blood cell count, unspecified: Secondary | ICD-10-CM | POA: Diagnosis not present

## 2021-03-15 DIAGNOSIS — D62 Acute posthemorrhagic anemia: Secondary | ICD-10-CM | POA: Diagnosis not present

## 2021-03-15 DIAGNOSIS — M86171 Other acute osteomyelitis, right ankle and foot: Secondary | ICD-10-CM | POA: Diagnosis not present

## 2021-03-15 DIAGNOSIS — D638 Anemia in other chronic diseases classified elsewhere: Secondary | ICD-10-CM | POA: Diagnosis not present

## 2021-03-15 LAB — CBC
HCT: 24.3 % — ABNORMAL LOW (ref 39.0–52.0)
Hemoglobin: 8.2 g/dL — ABNORMAL LOW (ref 13.0–17.0)
MCH: 27.2 pg (ref 26.0–34.0)
MCHC: 33.7 g/dL (ref 30.0–36.0)
MCV: 80.5 fL (ref 80.0–100.0)
Platelets: 240 10*3/uL (ref 150–400)
RBC: 3.02 MIL/uL — ABNORMAL LOW (ref 4.22–5.81)
RDW: 16.2 % — ABNORMAL HIGH (ref 11.5–15.5)
WBC: 10 10*3/uL (ref 4.0–10.5)
nRBC: 0 % (ref 0.0–0.2)

## 2021-03-15 LAB — SURGICAL PATHOLOGY

## 2021-03-15 NOTE — Progress Notes (Signed)
Physical Therapy Treatment Patient Details Name: [name] [name] MRN: [id number] DOB: 1940/07/11 Today's Date: 03/15/2021   History of Present Illness [name] [name] is a 80 y/o M who comes to [facility] on 11/15 presents with sepsis from an infected ulceration of the right foot and ankle. 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 PTA pt was mostly homebound, trying to stay off his foot wound, using RW. 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 Wyn Quaker. Pt with continue on transfer orders.    PT Comments    Pt with improved ability to attain upright standing at RW this date. Unable to attempt ambulation due to weakness and unsteadiness. Pt able to transfer to commode with repeated vc's for technique and hand placement. Continues to require ModA for sit<>stand. Pt educated on AROM exercises and R AKA desensitization techniques. Pt c/o 4/10 distal stump pain, nursing notified upon completion of session.    Recommendations for follow up therapy are one component of a multi-disciplinary discharge planning process, led by the attending physician.  Recommendations may be updated based on patient status, additional functional criteria and insurance authorization.  Follow Up Recommendations  Acute inpatient rehab (3hours/day)     Assistance Recommended at Discharge Frequent or constant Supervision/Assistance  Equipment Recommendations  None recommended by PT    Recommendations for Other Services       Precautions / Restrictions Precautions Precautions: Fall Restrictions Weight Bearing Restrictions: Yes RLE Weight Bearing: Non weight bearing     Mobility  Bed Mobility Overal bed mobility: Needs Assistance Bed Mobility: Sit to Supine     Supine to sit: Min assist Sit to supine: Min guard   General bed mobility comments: assist for scooting towards EOB    Transfers Overall  transfer level: Needs assistance Equipment used: Rolling walker (2 wheels) Transfers: Sit to/from Stand;Bed to chair/wheelchair/BSC Sit to Stand: Mod assist Stand pivot transfers: Mod assist         General transfer comment:  (Good demonstration of attaining upright standing at [facility])    Ambulation/Gait                   Stairs             Wheelchair Mobility    Modified Rankin (Stroke Patients Only)       Balance Overall balance assessment: Needs assistance Sitting-balance support: Feet supported;No upper extremity supported Sitting balance-Leahy Scale: Good Sitting balance - Comments: close supervision static sitting                                    Cognition Arousal/Alertness: Awake/alert Behavior During Therapy: WFL for tasks assessed/performed Overall Cognitive Status: Within Functional Limits for tasks assessed                                 General Comments: needs repeated cues to complete tasks        Exercises Amputee Exercises Straight Leg Raises: Right;5 reps;AROM Chair Push Up: 5 reps;AROM    General Comments General comments (skin integrity, edema, etc.):  (Pt educated on proper positioning of R stump and desensitization techniques to assist with decreasing pain levels)      Pertinent Vitals/Pain Pain Assessment: 0-10 Pain Score:  4  Pain Location: R stump Pain Descriptors / Indicators: Discomfort;Grimacing Pain Intervention(s): Monitored during session;Patient requesting pain meds-RN notified    Home Living                          Prior Function            PT Goals (current goals can now be found in the care plan section) Acute Rehab PT Goals Patient Stated Goal: get better    Frequency    7X/week      PT Plan Current plan remains appropriate    Co-evaluation              AM-PAC PT "6 Clicks" Mobility   Outcome Measure  Help needed turning from your back to your  side while in a flat bed without using bedrails?: A Little Help needed moving from lying on your back to sitting on the side of a flat bed without using bedrails?: A Little Help needed moving to and from a bed to a chair (including a wheelchair)?: A Lot Help needed standing up from a chair using your arms (e.g., wheelchair or bedside chair)?: A Lot Help needed to walk in hospital room?: Total Help needed climbing 3-5 steps with a railing? : Total 6 Click Score: 12    End of Session Equipment Utilized During Treatment: Gait belt Activity Tolerance: Patient tolerated treatment well Patient left: in bed;with call bell/phone within reach;with bed alarm set Nurse Communication: Mobility status PT Visit Diagnosis: Unsteadiness on feet (R26.81);Other abnormalities of gait and mobility (R26.89);Difficulty in walking, not elsewhere classified (R26.2);Muscle weakness (generalized) (M62.81)     Time: 1219-7588 PT Time Calculation (min) (ACUTE ONLY): 23 min  Charges:                       Zadie Cleverly, PTA    Jannet Askew 03/15/2021, 3:03 PM

## 2021-03-15 NOTE — TOC Progression Note (Signed)
Transition of Care [facility]) - Progression Note    Patient Details  Name: [name] [name] MRN: [id number] Date of Birth: 07/09/1940  Transition of Care [facility]) CM/SW Contact  Beverly Sessions, RN Phone Number: 03/15/2021, 1:37 PM  Clinical Narrative:     Insurance has denied  CIR Met with patient and wife at bedside.   No bed offers in [employer].   Wife would like to see if [facility] [place] call offer  Referral sent and Mitchell Heir at [facility] to review          Expected Discharge Plan and Services                                                 Social Determinants of Health (SDOH) Interventions    Readmission Risk Interventions No flowsheet data found.

## 2021-03-15 NOTE — Progress Notes (Signed)
Patient ID: [name] [name], male   DOB: [birth year], 80 y.o.   MRN: [id number] [facility] PROGRESS NOTE  [name] [name] [id number] DOB: 14-Mar-1941 DOA: 03/08/2021 PCP: Lorenso Quarry, NP  HPI/Subjective: Patient feeling okay.  Offers no complaints.  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  Peer-to-peer done today with insurance company and they rejected acute rehab.  Objective: Vitals:   03/15/21 0500 03/15/21 0746  BP: 131/81 117/82  Pulse: 67 74  Resp: 18 18  Temp: 97.8 F (36.6 C) 97.7 F (36.5 C)  SpO2: 100% 100%    Intake/Output Summary (Last 24 hours) at 03/15/2021 1343 Last data filed at 03/15/2021 1031 Gross per 24 hour  Intake 1200 ml  Output 4300 ml  Net -3100 ml   Filed Weights   03/08/21 1221 03/09/21 0949 03/11/21 1231  Weight: 86.2 kg 86.2 kg 86.2 kg    ROS: Review of Systems  Respiratory:  Negative for shortness of breath.   Cardiovascular:  Negative for chest pain.  Gastrointestinal:  Negative for abdominal pain, nausea and vomiting.  Exam: Physical Exam HENT:     Head: Normocephalic.     Mouth/Throat:     Pharynx: No oropharyngeal exudate.  Eyes:     General: Lids are normal.     Conjunctiva/sclera: Conjunctivae normal.  Cardiovascular:     Rate and Rhythm: Normal rate and regular rhythm.     Heart sounds: Normal heart sounds, S1 normal and S2 normal.  Pulmonary:     Breath sounds: Normal breath sounds. No decreased breath sounds, wheezing, rhonchi or rales.  Abdominal:     Palpations: Abdomen is soft.     Tenderness: There is no abdominal tenderness.  Musculoskeletal:     Left lower leg: No swelling.  Skin:    General: Skin is warm.     Findings: No rash.  Neurological:     Mental Status: He is alert and oriented to person, place, and time.      Scheduled Meds:  vitamin C  500 mg Oral BID   atorvastatin  10 mg Oral q1800   carvedilol  6.25 mg Oral BID   digoxin  0.125 mg Oral Daily   ferrous gluconate   324 mg Oral BID WC   furosemide  20 mg Intravenous Once   gabapentin  100 mg Oral TID   multivitamin-lutein  1 capsule Oral Daily   Ensure Max Protein  11 oz Oral TID   rivaroxaban  20 mg Oral Q supper   vitamin B-12  1,000 mcg Oral Daily   Brief history 80 year old man with history of type 2 diabetes mellitus, hypertension, CHF presented to the hospital with right foot infection.  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  Antibiotics discontinued 2 days after surgery.  Vascular surgery cleared for disposition.  Acute rehab rejected.  So far no bed offers for subacute rehab. Acute blood loss anemia on anemia of chronic disease.  The patient did have 2 units of packed red blood cells during the hospital course.  Last hemoglobin 8.2. Leukocytosis secondary to infection.  White blood cell count normalized at 10.0. Lactic acidosis on presentation secondary to infection Chronic diastolic congestive heart failure.  Ejection fraction normal range as outpatient.  Continue Coreg and Lasix. Chronic atrial fibrillation.  Restarted Xarelto  yesterday. Hyperlipidemia unspecified on atorvastatin Moderate malnutrition Hyponatremia on presentation has resolved.        Code Status:     Code Status Orders  (From admission, onward)           Start     Ordered   03/08/21 2339  Full code  Continuous        03/08/21 2338           Code Status History     Date Active Date Inactive Code Status Order ID Comments User Context   02/10/2021 1214 02/10/2021 1924 Full Code [id number]  Annice Needy, MD Inpatient      Family Communication: Spoke with wife at bedside Disposition Plan: Status is: Inpatient.  Acute rehab rejected by insurance company.  So far no bed offers at subacute  rehab.  Time spent: 27 minutes  Wolf Boulay [employer]

## 2021-03-15 NOTE — Progress Notes (Signed)
Patient laying in bed, no acute distress noted.  No significant changes noted in patient's status, agree with Edward Jolly, RN on AM assessment.  Will report off to oncoming staff.

## 2021-03-15 NOTE — TOC Progression Note (Signed)
Transition of Care [facility]) - Progression Note    Patient Details  Name: [name] [name] MRN: [id number] Date of Birth: Aug 23, 1940  Transition of Care [facility]) CM/SW Contact  Chapman Fitch, RN Phone Number: 03/15/2021, 3:51 PM  Clinical Narrative:     Presented bed offers to wife and patient.  They accept bed at [facility] in [place].  Not managed by [facility] per portal. Requested Allyson at [facility] to start auth        Expected Discharge Plan and Services                                                 Social Determinants of Health (SDOH) Interventions    Readmission Risk Interventions No flowsheet data found.

## 2021-03-15 NOTE — Progress Notes (Signed)
Inpatient Rehab Admissions Coordinator:   Insurance has denied CIR.  I am not pursuing an appeal, as [employer] rarely overturns denials on appeal for this diagnosis. CIR will sign off. TOC updated.  Megan Salon, MS, CCC-SLP Rehab Admissions Coordinator  [phone] (celll) [phone] (office)

## 2021-03-15 NOTE — Progress Notes (Signed)
Occupational Therapy Treatment Patient Details Name: [name] [name] MRN: [id number] DOB: 09/09/1940 Today's Date: 03/15/2021   History of present illness [name] [name] is a 80 y/o M who comes to [facility] on 11/15 presents with sepsis from an infected ulceration of the right foot and ankle. 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 PTA pt was mostly homebound, trying to stay off his foot wound, using RW. 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 Lucky Cowboy. Pt with continue on transfer orders.   OT comments  Chart reviewed, RN cleared pt for participation. Tx session targeted progressing improving functional mobility in preparation for ADL tasks, dynamic sitting balance during ADL tasks. Pt required MIN A sup>sit at edge of bed. Dynamic sitting tasks (grooming tasks) at edge of bed for approx 10 min with F-G balance. STS 5x 2 sets with MOD A, frequent vcs for use of BUE for assist with transfer. SET UP for grooming tasks in seated. CGA required for sit>supine. Pt appears to be making progress towards goals, demonstrated good tolerance for graded up mobility tasks on this date. Pt is left as received, NAD, all needs met. Will continue to benefit from skilled OT. Recommend discharge to CIR to address functional deficits, STR if unable to discharge to CIR.  OT will continue to follow.    Recommendations for follow up therapy are one component of a multi-disciplinary discharge planning process, led by the attending physician.  Recommendations may be updated based on patient status, additional functional criteria and insurance authorization.    Follow Up Recommendations  Acute inpatient rehab (3hours/day)    Assistance Recommended at Discharge Intermittent Supervision/Assistance  Equipment Recommendations  BSC/3in1;Tub/shower bench    Recommendations for Other Services      Precautions /  Restrictions Precautions Precautions: Fall Restrictions Weight Bearing Restrictions: Yes RLE Weight Bearing: Non weight bearing Other Position/Activity Restrictions: per chart review       Mobility Bed Mobility Overal bed mobility: Needs Assistance Bed Mobility: Supine to Sit;Sit to Supine     Supine to sit: Min assist Sit to supine: Min guard   General bed mobility comments: assist for scooting towards EOB    Transfers Overall transfer level: Needs assistance Equipment used: Rolling walker (2 wheels) Transfers: Sit to/from Stand Sit to Stand: Mod assist Stand pivot transfers: Mod assist         General transfer comment: frequent vcs for approrpiate body mechanics during technique     Balance Overall balance assessment: Needs assistance Sitting-balance support:  (L foot supported) Sitting balance-Leahy Scale: Good Sitting balance - Comments: dynamic sitting close SUP   Standing balance support: No upper extremity supported Standing balance-Leahy Scale: Poor Standing balance comment: Improved performance with vcs and repeated practice                           ADL either performed or assessed with clinical judgement   ADL                                         General ADL Comments: MIN A sup>sit at edge of bed. LB dressing with MAX A. Dynamic sitting tasks (grooming tasks) at edge of bed for approx 10 min with F-G balance. STS 5x  2 sets with MOD A, frequent vcs for use of BUE for assist with transfer. SET UP for grooming tasks in seated.    Extremity/Trunk Assessment Upper Extremity Assessment Upper Extremity Assessment: Generalized weakness   Lower Extremity Assessment Lower Extremity Assessment: RLE deficits/detail RLE Deficits / Details: R AKA        Vision       Perception     Praxis      Cognition Arousal/Alertness: Awake/alert Behavior During Therapy: WFL for tasks assessed/performed Overall Cognitive Status:  Within Functional Limits for tasks assessed                                 General Comments: increased time for task completion          Exercises Exercises: Amputee Amputee Exercises Straight Leg Raises: Right;5 reps;AROM Chair Push Up: 5 reps;AROM Other Exercises Other Exercises: education re: BUE strengthening while in bed in preparation for improved functional transfers   Shoulder Instructions       General Comments  (Pt educated on proper positioning of R stump and desensitization techniques to assist with decreasing pain levels)    Pertinent Vitals/ Pain       Pain Assessment: 0-10 Pain Score: 2  Pain Location: R stump Pain Descriptors / Indicators: Discomfort Pain Intervention(s): Limited activity within patient's tolerance;Premedicated before session;Repositioned;Monitored during session  Home Living                                          Prior Functioning/Environment              Frequency  Min 3X/week        Progress Toward Goals  OT Goals(current goals can now be found in the care plan section)  Progress towards OT goals: Progressing toward goals  Acute Rehab OT Goals Patient Stated Goal: to get stronger OT Goal Formulation: With patient  Plan Discharge plan remains appropriate    Co-evaluation                 AM-PAC OT "6 Clicks" Daily Activity     Outcome Measure   Help from another person eating meals?: None Help from another person taking care of personal grooming?: A Little Help from another person toileting, which includes using toliet, bedpan, or urinal?: A Lot Help from another person bathing (including washing, rinsing, drying)?: A Lot Help from another person to put on and taking off regular upper body clothing?: None Help from another person to put on and taking off regular lower body clothing?: A Lot 6 Click Score: 17    End of Session Equipment Utilized During Treatment: Gait  belt  OT Visit Diagnosis: Other abnormalities of gait and mobility (R26.89);Muscle weakness (generalized) (M62.81)   Activity Tolerance Patient tolerated treatment well   Patient Left in bed;with call bell/phone within reach;with bed alarm set   Nurse Communication Mobility status        Time: 4008-6761 OT Time Calculation (min): 25 min  Charges: OT General Charges $OT Visit: 1 Visit OT Treatments $Self Care/Home Management : 23-37 mins  Shanon Payor, OTD OTR/L  03/15/21, 4:30 PM

## 2021-03-15 NOTE — Plan of Care (Signed)

## 2021-03-16 ENCOUNTER — Encounter: Payer: Medicare HMO | Admitting: Internal Medicine

## 2021-03-16 DIAGNOSIS — I739 Peripheral vascular disease, unspecified: Secondary | ICD-10-CM | POA: Diagnosis not present

## 2021-03-16 DIAGNOSIS — R5381 Other malaise: Secondary | ICD-10-CM | POA: Diagnosis not present

## 2021-03-16 DIAGNOSIS — D62 Acute posthemorrhagic anemia: Secondary | ICD-10-CM | POA: Diagnosis not present

## 2021-03-16 DIAGNOSIS — I4811 Longstanding persistent atrial fibrillation: Secondary | ICD-10-CM | POA: Diagnosis not present

## 2021-03-16 DIAGNOSIS — E440 Moderate protein-calorie malnutrition: Secondary | ICD-10-CM | POA: Diagnosis not present

## 2021-03-16 DIAGNOSIS — Z743 Need for continuous supervision: Secondary | ICD-10-CM | POA: Diagnosis not present

## 2021-03-16 DIAGNOSIS — Z4781 Encounter for orthopedic aftercare following surgical amputation: Secondary | ICD-10-CM | POA: Diagnosis not present

## 2021-03-16 DIAGNOSIS — Z23 Encounter for immunization: Secondary | ICD-10-CM | POA: Diagnosis not present

## 2021-03-16 DIAGNOSIS — M86171 Other acute osteomyelitis, right ankle and foot: Secondary | ICD-10-CM | POA: Diagnosis not present

## 2021-03-16 DIAGNOSIS — M869 Osteomyelitis, unspecified: Secondary | ICD-10-CM | POA: Diagnosis not present

## 2021-03-16 DIAGNOSIS — E119 Type 2 diabetes mellitus without complications: Secondary | ICD-10-CM | POA: Diagnosis not present

## 2021-03-16 DIAGNOSIS — D509 Iron deficiency anemia, unspecified: Secondary | ICD-10-CM | POA: Diagnosis not present

## 2021-03-16 DIAGNOSIS — D72828 Other elevated white blood cell count: Secondary | ICD-10-CM | POA: Diagnosis not present

## 2021-03-16 DIAGNOSIS — J45909 Unspecified asthma, uncomplicated: Secondary | ICD-10-CM | POA: Diagnosis not present

## 2021-03-16 DIAGNOSIS — I5030 Unspecified diastolic (congestive) heart failure: Secondary | ICD-10-CM | POA: Diagnosis not present

## 2021-03-16 DIAGNOSIS — I482 Chronic atrial fibrillation, unspecified: Secondary | ICD-10-CM | POA: Diagnosis not present

## 2021-03-16 DIAGNOSIS — Z89611 Acquired absence of right leg above knee: Secondary | ICD-10-CM | POA: Diagnosis not present

## 2021-03-16 DIAGNOSIS — R279 Unspecified lack of coordination: Secondary | ICD-10-CM | POA: Diagnosis not present

## 2021-03-16 DIAGNOSIS — E1169 Type 2 diabetes mellitus with other specified complication: Secondary | ICD-10-CM | POA: Diagnosis not present

## 2021-03-16 LAB — HEMOGLOBIN: Hemoglobin: 8.3 g/dL — ABNORMAL LOW (ref 13.0–17.0)

## 2021-03-16 MED ORDER — FERROUS GLUCONATE 324 (38 FE) MG PO TABS: 324.0000 mg | ORAL_TABLET | Freq: Two times a day (BID) | ORAL | 0 refills | Status: DC

## 2021-03-16 MED ORDER — GABAPENTIN 100 MG PO CAPS: 100.0000 mg | ORAL_CAPSULE | Freq: Three times a day (TID) | ORAL | 0 refills | Status: AC

## 2021-03-16 NOTE — TOC Progression Note (Signed)
Transition of Care [facility]) - Progression Note    Patient Details  Name: [name] [name] MRN: [id number] Date of Birth: 21-Apr-1941  Transition of Care [facility]) CM/SW Contact  Chapman Fitch, RN Phone Number: 03/16/2021, 1:57 PM  Clinical Narrative:     Per Bella Kennedy at [facility] auth still pending        Expected Discharge Plan and Services                                                 Social Determinants of Health (SDOH) Interventions    Readmission Risk Interventions No flowsheet data found.

## 2021-03-16 NOTE — Care Management Important Message (Signed)
Important Message  Patient Details  Name: [name] [name] MRN: [id number] Date of Birth: 06-12-1940   Medicare Important Message Given:  Yes     [name] 03/16/2021, 10:47 AM

## 2021-03-16 NOTE — Progress Notes (Signed)
Physical Therapy Treatment Patient Details Name: [name] [name] MRN: [id number] DOB: Nov 24, 1940 Today's Date: 03/16/2021   History of Present Illness [name] [name] is a 80 y/o M who comes to [facility] on 11/15 presents with sepsis from an infected ulceration of the right foot and ankle. 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 PTA pt was mostly homebound, trying to stay off his foot wound, using RW. 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 Wyn Quaker. Pt with continue on transfer orders.    PT Comments    Focused session on transfers and mobility via w/c level. Pt continues to require ModA to complete stand pivot transfers without AD and lateral scooting bed<>w/c due to upper body weakness and need for practice. Pt is very pleasant and motivated to improve. Completed 181ft of w/c mobility on level surface with SBA for technique. Pt educated on proper positioning of R AKA as well. Pt awaiting d/c to SNF closer to home/friends.   Recommendations for follow up therapy are one component of a multi-disciplinary discharge planning process, led by the attending physician.  Recommendations may be updated based on patient status, additional functional criteria and insurance authorization.  Follow Up Recommendations  Acute inpatient rehab (3hours/day)     Assistance Recommended at Discharge Frequent or constant Supervision/Assistance  Equipment Recommendations  None recommended by PT    Recommendations for Other Services       Precautions / Restrictions Precautions Precautions: Fall Restrictions Weight Bearing Restrictions: Yes RLE Weight Bearing: Non weight bearing Other Position/Activity Restrictions: per chart review     Mobility  Bed Mobility Overal bed mobility: Needs Assistance Bed Mobility: Supine to Sit;Sit to Supine     Supine to sit: Min assist Sit to supine: Min  guard   General bed mobility comments: assist for scooting towards EOB    Transfers Overall transfer level: Needs assistance Equipment used: None Transfers:  (bed/wheelchair) Sit to Stand: Mod assist Stand pivot transfers: Mod assist Squat pivot transfers: Mod assist      Lateral/Scoot Transfers: Min assist;Mod assist General transfer comment:  (vc's to safely scoot and pull self to/from wheelchair)    Ambulation/Gait                   Psychologist, counselling mobility: Yes Wheelchair propulsion: Both upper extremities;Left lower extremity Wheelchair parts: Needs assistance Distance: 175 Wheelchair Assistance Details (indicate cue type and reason):  (Pt required stand by assistance in order to provide vc's for proper mobility technique, safety, and negotiation around objects.)  Modified Rankin (Stroke Patients Only)       Balance                                            Cognition Arousal/Alertness: Awake/alert Behavior During Therapy: WFL for tasks assessed/performed Overall Cognitive Status: Within Functional Limits for tasks assessed                                 General Comments:  (Very pleasant and motivated)        Exercises      General Comments General  comments (skin integrity, edema, etc.): Pt educated on how to engage w/c breaks, how to propel, negotiating objects and turns. Pt with fair understanding, will benefit from continued practice/education      Pertinent Vitals/Pain Pain Assessment: Faces Faces Pain Scale: Hurts a little bit Pain Location: R stump Pain Descriptors / Indicators: Discomfort Pain Intervention(s): Monitored during session    Home Living                          Prior Function            PT Goals (current goals can now be found in the care plan section) Acute Rehab PT Goals Patient Stated Goal: get better     Frequency    7X/week      PT Plan Current plan remains appropriate    Co-evaluation              AM-PAC PT "6 Clicks" Mobility   Outcome Measure  Help needed turning from your back to your side while in a flat bed without using bedrails?: A Little Help needed moving from lying on your back to sitting on the side of a flat bed without using bedrails?: A Little Help needed moving to and from a bed to a chair (including a wheelchair)?: A Lot Help needed standing up from a chair using your arms (e.g., wheelchair or bedside chair)?: A Lot Help needed to walk in hospital room?: Total Help needed climbing 3-5 steps with a railing? : Total 6 Click Score: 12    End of Session Equipment Utilized During Treatment: Gait belt Activity Tolerance: Patient tolerated treatment well Patient left: in bed;with call bell/phone within reach;with bed alarm set Nurse Communication: Mobility status PT Visit Diagnosis: Unsteadiness on feet (R26.81);Other abnormalities of gait and mobility (R26.89);Difficulty in walking, not elsewhere classified (R26.2);Muscle weakness (generalized) (M62.81)     Time:  -     Charges:             Zadie Cleverly, PTA   Jannet Askew 03/16/2021, 4:15 PM

## 2021-03-16 NOTE — Discharge Summary (Addendum)
Physician Discharge Summary  [name] [name] VZD:[id number] DOB: 08-27-1940 DOA: 03/08/2021  PCP: [name] Quarry, NP  Admit date: 03/08/2021 Discharge date: 03/16/2021  Admitted From: home  Disposition:  SNF  Recommendations for Outpatient Follow-up:  Follow up with PCP in 1-2 weeks F/u w/ vascular surg, NP [name] [name], in 3 weeks   Home Health: no  Equipment/Devices:  Discharge Condition: stable CODE STATUS: full  Diet recommendation: Heart Healthy   Brief/Interim Summary: HPI was taken from Dr. Thomes Dinning: [name] [name] is a 80 y.o. male with medical history significant for essential hypertension, type 2 diabetes mellitus, atrial fibrillation on Xarelto, CHF, mixed hyperlipidemia who presents to the emergency department accompanied by wife due to several day onset of worsening pain from chronic wound of right ankle.  Patient has been following with wound care center for 2 to 3 months, hyperbaric treatment was recently started within last 2 weeks (per wife at bedside), patient went to the wound care center today due to several day onset of worsening pain from the wound and inflammation of right ankle.  After evaluation at the wound clinic, he was asked to go to the ED for further evaluation due to concern for acute osteomyelitis and possible septic joint.  Patient denies chest pain, shortness of breath, fever, chills, nausea, vomiting.  Patient was unsure of being on antibiotics.  He states that he stopped taking his Xarelto few days ago.   ED Course:  In the emergency department, he was hemodynamically stable, though BP was soft at 104/60.  Work-up in the ED showed leukocytosis with a left shift.  Normocytic anemia, hyponatremia, hypoalbuminemia, hyperglycemia, lactic acid 2.6 > 2.1.  Sed rate 126.  Influenza A, B, SARS coronavirus 2 was negative. Right ankle x-ray showed large lateral also, the ankle with suspicion for osteomyelitis.  Intra-articular gas at the anterior tibiotalar joint is  concerning for direct complication/septic arthritis.  Extensive soft tissue swelling at the ankle with gas medially, concern for cellulitis with gas-forming organism. Chest x-ray showed no acute cardiopulmonary process. Patient was treated with IV cefepime and vancomycin.  IV hydration was provided, morphine was given due to pain.  Hospitalist was asked to admit patient for further evaluation and management.    As per Dr. Renae Gloss: 80 year old man with history of type 2 diabetes mellitus, hypertension, CHF presented to the hospital with right foot infection.  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  Hospital course from Dr. Mayford Knife 11/18 & 03/16/21: Pt presented w/ acute osteomyelitis of the right ankle w/ open wounds. 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 Pt has completed an abx course while inpatient. Pt will need continue w/ daily dressing changes of R stump as per vascular surg. Pt will f/u w/ vascular surg in 3 weeks. Of note, pt did receive 2 units of pRBCs transfused while inpatient. PT/OT evaluated the pt and recommended SNF. For more information, please see previous progress/consult notes.   Discharge Diagnoses:  Principal Problem:   Acute osteomyelitis of right ankle (HCC) Active Problems:   Atrial fibrillation (HCC)   CHF (congestive heart failure) (HCC)   Hyperlipidemia   Open wound of right ankle   Lactic acidosis   Hypoalbuminemia due to protein-calorie malnutrition (HCC)   Hyperglycemia due to diabetes mellitus (HCC)   Hyponatremia   Mixed hyperlipidemia   Peripheral arterial disease (HCC)   Moderate malnutrition (HCC)   Anemia of chronic disease  Leukocytosis   Chronic diastolic CHF (congestive heart failure) (HCC)   Acute blood loss anemia  Acute osteomyelitis of the right ankle: w/ open wounds.  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  Completed abx  course.  Vascular surgery cleared for d/c  Acute blood loss anemia on anemia of chronic disease: s/p 2 units of pRBCs transfused. Will continue to monitor H&H. H&H are trending up   Leukocytosis: resolved  Lactic acidosis: resolved  Chronic diastolic CHF: continue on coreg, lasix. Monitor I/Os.  Chronic a. fib: continue on coreg, xarelto    HLD: continue on statin  Moderate malnutrition: continue on nutritional supplements  Hyponatremia: resolved.  Discharge Instructions  Discharge Instructions     Diet - low sodium heart healthy   Complete by: As directed    Discharge instructions   Complete by: As directed    F/u w/ PCP in 1-2 weeks. F/u w/ vascular surg, NP [name] [name], in 3 weeks   Discharge wound care:   Complete by: As directed    Daily Dressing Changes to right AKA: ABD to incision, covered with Kerlix, covered with Ace   Increase activity slowly   Complete by: As directed       Allergies as of 03/16/2021   No Known Allergies      Medication List     STOP taking these medications    aspirin EC 81 MG tablet   ibuprofen 200 MG tablet Commonly known as: ADVIL       TAKE these medications    acetaminophen 500 MG tablet Commonly known as: TYLENOL Take 500-1,000 mg by mouth every 6 (six) hours as needed for mild pain or moderate pain.   atorvastatin 10 MG tablet Commonly known as: Lipitor Take 1 tablet (10 mg total) by mouth daily.   carvedilol 6.25 MG tablet Commonly known as: COREG Take 6.25 mg 2 (two) times daily by mouth.   digoxin 0.125 MG tablet Commonly known as: LANOXIN Take 0.125 mcg daily by mouth.   ferrous gluconate 324 MG tablet Commonly known as: FERGON Take 1 tablet (324 mg total) by mouth 2 (two) times daily with a meal.   furosemide 20 MG tablet Commonly known as: LASIX Take 20 mg daily by mouth.   gabapentin 100 MG capsule Commonly known as: NEURONTIN Take 1 capsule (100 mg total) by mouth 3 (three) times  daily.   levofloxacin 500 MG tablet Commonly known as: LEVAQUIN Take 500 mg by mouth daily.   lisinopril 10 MG tablet Commonly known as: ZESTRIL Take 15 mg by mouth daily.   metFORMIN 1000 MG tablet Commonly known as: GLUCOPHAGE Take 1,000 mg by mouth 2 (two) times daily with a meal.   rivaroxaban 20 MG Tabs tablet Commonly known as: XARELTO Take 20 mg daily by mouth.   vitamin B-12 1000 MCG tablet Commonly known as: CYANOCOBALAMIN Take 1,000 mcg by mouth daily.               Discharge Care Instructions  (From admission, onward)           Start     Ordered   03/16/21 0000  Discharge wound care:       Comments: Daily Dressing Changes to right AKA: ABD to incision, covered with Kerlix, covered with Ace   03/16/21 1538            Contact information for follow-up providers     Georgiana Spinner, NP Follow up in 3 week(s).  Specialty: Vascular Surgery Why: First postoperative visit. Possible staple removal. No studies needed. Contact information: [address] [place] [address] [phone]              Contact information for after-discharge care     Destination     [facility] .   Service: Skilled Nursing Contact information: [address] [place] [address] [phone]                    No Known Allergies  Consultations: Vascular surg    Procedures/Studies: DG Chest 2 View  Result Date: 03/08/2021 CLINICAL DATA:  Suspected sepsis EXAM: CHEST - 2 VIEW COMPARISON:  02/22/2021. FINDINGS: Cardiac and mediastinal contours are within normal limits. Aortic atherosclerosis. No focal pulmonary opacity. No pleural effusion or pneumothorax. No acute osseous abnormality. IMPRESSION: No acute cardiopulmonary process. Aortic Atherosclerosis (ICD10-I70.0). Electronically Signed   By: Wiliam Ke M.D.   On: 03/08/2021 13:13   DG Chest 2 View  Result Date: 02/22/2021 CLINICAL  DATA:  Preop for hyperbaric oxygen treatment. Nonhealing right lower extremity wound. EXAM: CHEST - 2 VIEW COMPARISON:  02/22/2012 FINDINGS: The heart size and mediastinal contours are within normal limits. Both lungs are clear. The visualized skeletal structures are unremarkable. IMPRESSION: No active cardiopulmonary disease. Electronically Signed   By: Danae Orleans M.D.   On: 02/22/2021 14:52   DG Ankle Complete Right  Result Date: 03/08/2021 CLINICAL DATA:  Suspected Sepsis EXAM: RIGHT ANKLE - COMPLETE 3+ VIEW COMPARISON:  February 08, 2021. FINDINGS: Large lateral ulcer at the ankle with erosive change in the subjacent distal fibula, compatible with osteomyelitis. Intra-articular gas at the anterior tibiotalar joint. Extensive soft tissue swelling at the ankle with gas medially. No evidence of acute fracture. No joint malalignment. IMPRESSION: 1. Large lateral ulcer at the ankle with extensive erosive change and osteopenia in the subjacent distal fibula and the lateral talus, compatible with osteomyelitis. Intra-articular gas at the anterior tibiotalar joint is concerning for direct communication/septic arthritis. 2. Extensive soft tissue swelling at the ankle with gas medially, concerning for cellulitis with gas-forming organism. Electronically Signed   By: Feliberto Harts M.D.   On: 03/08/2021 13:18   (Echo, Carotid, EGD, Colonoscopy, ERCP)    Subjective: Pt denies any complaints    Discharge Exam: Vitals:   03/16/21 0519 03/16/21 0819  BP: 119/63 120/75  Pulse: 71 92  Resp: 16 18  Temp: 98.6 F (37 C) 97.9 F (36.6 C)  SpO2: 98% 98%   Vitals:   03/15/21 2043 03/15/21 2100 03/16/21 0519 03/16/21 0819  BP: (!) 123/101 (!) 141/77 119/63 120/75  Pulse: 60 66 71 92  Resp: 16  16 18   Temp: 98.6 F (37 C)  98.6 F (37 C) 97.9 F (36.6 C)  TempSrc: Oral  Oral Oral  SpO2: 100%  98% 98%  Weight:      Height:        General: Pt is alert, awake, not in acute  distress Cardiovascular: S1/S2 +, no rubs, no gallops Respiratory: CTA bilaterally, no wheezing, no rhonchi Abdominal: Soft, NT, ND, bowel sounds + Extremities:  no cyanosis    The results of significant diagnostics from this hospitalization (including imaging, microbiology, ancillary and laboratory) are listed below for reference.     Microbiology: Recent Results (from the past 240 hour(s))  Culture, blood (Routine x 2)     Status: None   Collection Time: 03/08/21 12:35 PM   Specimen: BLOOD  Result Value Ref Range Status   Specimen Description BLOOD BLOOD RIGHT FOREARM  Final   Special Requests   Final    BOTTLES DRAWN AEROBIC AND ANAEROBIC Blood Culture adequate volume   Culture   Final    NO GROWTH 5 DAYS Performed at [facility], [address] [place], [place] [address]    Report Status 03/13/2021 FINAL  Final  Culture, blood (Routine x 2)     Status: None   Collection Time: 03/08/21  2:04 PM   Specimen: BLOOD  Result Value Ref Range Status   Specimen Description BLOOD left arm  Final   Special Requests   Final    BOTTLES DRAWN AEROBIC AND ANAEROBIC Blood Culture results may not be optimal due to an inadequate volume of blood received in culture bottles   Culture   Final    NO GROWTH 5 DAYS Performed at [facility], [address] [place], [place] [address]    Report Status 03/13/2021 FINAL  Final  Resp Panel by RT-PCR (Flu A&B, Covid) Nasopharyngeal Swab     Status: None   Collection Time: 03/08/21  2:05 PM   Specimen: Nasopharyngeal Swab; Nasopharyngeal(NP) swabs in vial transport medium  Result Value Ref Range Status   SARS Coronavirus 2 by RT PCR NEGATIVE NEGATIVE Final    Comment: (NOTE) SARS-CoV-2 target nucleic acids are NOT DETECTED.  The SARS-CoV-2 RNA is generally detectable in upper respiratory specimens during the acute phase of infection. The lowest concentration of SARS-CoV-2 viral copies this assay can detect is 138  copies/mL. A negative result does not preclude SARS-Cov-2 infection and should not be used as the sole basis for treatment or other patient management decisions. A negative result may occur with  improper specimen collection/handling, submission of specimen other than nasopharyngeal swab, presence of viral mutation(s) within the areas targeted by this assay, and inadequate number of viral copies(<138 copies/mL). A negative result must be combined with clinical observations, patient history, and epidemiological information. The expected result is Negative.  Fact Sheet for Patients:  BloggerCourse.com  Fact Sheet for Healthcare Providers:  SeriousBroker.it  This test is no t yet approved or cleared by the [place] FDA and  has been authorized for detection and/or diagnosis of SARS-CoV-2 by FDA under an Emergency Use Authorization (EUA). This EUA will remain  in effect (meaning this test can be used) for the duration of the COVID-19 declaration under Section 564(b)(1) of the Act, 21 U.S.C.section 360bbb-3(b)(1), unless the authorization is terminated  or revoked sooner.       Influenza A by PCR NEGATIVE NEGATIVE Final   Influenza B by PCR NEGATIVE NEGATIVE Final    Comment: (NOTE) The Xpert Xpress SARS-CoV-2/FLU/RSV plus assay is intended as an aid in the diagnosis of influenza from Nasopharyngeal swab specimens and should not be used as a sole basis for treatment. Nasal washings and aspirates are unacceptable for Xpert Xpress SARS-CoV-2/FLU/RSV testing.  Fact Sheet for Patients: BloggerCourse.com  Fact Sheet for Healthcare Providers: SeriousBroker.it  This test is not yet approved or cleared by the [place] FDA and has been authorized for detection and/or diagnosis of SARS-CoV-2 by FDA under an Emergency Use Authorization (EUA). This EUA will remain in effect (meaning  this test can be used) for the duration of the COVID-19 declaration under Section 564(b)(1) of the Act, 21 U.S.C. section 360bbb-3(b)(1), unless the authorization is terminated or revoked.  Performed at [facility], [address], [place], [place] [address]  Labs: BNP (last 3 results) No results for input(s): BNP in the last 8760 hours. Basic Metabolic Panel: Recent Labs  Lab 03/10/21 0446 03/11/21 0446 03/12/21 0611 03/14/21 0330  NA 137 136  --   --   K 4.2 4.3  --   --   CL 106 106  --   --   CO2 25 25  --   --   GLUCOSE 141* 107*  --   --   BUN 23 33*  --   --   CREATININE 1.31* 1.28* 1.00 0.98  CALCIUM 7.7* 7.9*  --   --   MG 2.1 2.3  --   --    Liver Function Tests: No results for input(s): AST, ALT, ALKPHOS, BILITOT, PROT, ALBUMIN in the last 168 hours. No results for input(s): LIPASE, AMYLASE in the last 168 hours. No results for input(s): AMMONIA in the last 168 hours. CBC: Recent Labs  Lab 03/11/21 0446 03/12/21 0611 03/13/21 0553 03/14/21 0330 03/15/21 0723 03/16/21 0416  WBC 19.5* 16.8* 15.5* 12.9* 10.0  --   HGB 8.6* 7.9* 6.9* 7.9* 8.2* 8.3*  HCT 25.6* 23.4* 20.5* 23.1* 24.3*  --   MCV 79.5* 77.5* 76.8* 79.1* 80.5  --   PLT 281 278 233 236 240  --    Cardiac Enzymes: No results for input(s): CKTOTAL, CKMB, CKMBINDEX, TROPONINI in the last 168 hours. BNP: Invalid input(s): POCBNP CBG: Recent Labs  Lab 03/11/21 1548 03/11/21 2143 03/12/21 0848 03/12/21 1237 03/13/21 1621  GLUCAP 96 148* 99 149* 136*   D-Dimer No results for input(s): DDIMER in the last 72 hours. Hgb A1c No results for input(s): HGBA1C in the last 72 hours. Lipid Profile No results for input(s): CHOL, HDL, LDLCALC, TRIG, CHOLHDL, LDLDIRECT in the last 72 hours. Thyroid function studies No results for input(s): TSH, T4TOTAL, T3FREE, THYROIDAB in the last 72 hours.  Invalid input(s): FREET3 Anemia work up No results for input(s): VITAMINB12, FOLATE,  FERRITIN, TIBC, IRON, RETICCTPCT in the last 72 hours. Urinalysis    Component Value Date/Time   COLORURINE STRAW (A) 03/10/2021 0716   APPEARANCEUR CLEAR (A) 03/10/2021 0716   LABSPEC 1.010 03/10/2021 0716   PHURINE 5.0 03/10/2021 0716   GLUCOSEU NEGATIVE 03/10/2021 0716   HGBUR SMALL (A) 03/10/2021 0716   BILIRUBINUR NEGATIVE 03/10/2021 0716   KETONESUR NEGATIVE 03/10/2021 0716   PROTEINUR NEGATIVE 03/10/2021 0716   NITRITE NEGATIVE 03/10/2021 0716   LEUKOCYTESUR NEGATIVE 03/10/2021 0716   Sepsis Labs Invalid input(s): PROCALCITONIN,  WBC,  LACTICIDVEN Microbiology Recent Results (from the past 240 hour(s))  Culture, blood (Routine x 2)     Status: None   Collection Time: 03/08/21 12:35 PM   Specimen: BLOOD  Result Value Ref Range Status   Specimen Description BLOOD BLOOD RIGHT FOREARM  Final   Special Requests   Final    BOTTLES DRAWN AEROBIC AND ANAEROBIC Blood Culture adequate volume   Culture   Final    NO GROWTH 5 DAYS Performed at [facility], [address] [place], [place] [address]    Report Status 03/13/2021 FINAL  Final  Culture, blood (Routine x 2)     Status: None   Collection Time: 03/08/21  2:04 PM   Specimen: BLOOD  Result Value Ref Range Status   Specimen Description BLOOD left arm  Final   Special Requests   Final    BOTTLES DRAWN AEROBIC AND ANAEROBIC Blood Culture results may not be optimal due to an inadequate volume of  blood received in culture bottles   Culture   Final    NO GROWTH 5 DAYS Performed at [facility], [address] [place], [place] [address]    Report Status 03/13/2021 FINAL  Final  Resp Panel by RT-PCR (Flu A&B, Covid) Nasopharyngeal Swab     Status: None   Collection Time: 03/08/21  2:05 PM   Specimen: Nasopharyngeal Swab; Nasopharyngeal(NP) swabs in vial transport medium  Result Value Ref Range Status   SARS Coronavirus 2 by RT PCR NEGATIVE NEGATIVE Final    Comment: (NOTE) SARS-CoV-2 target nucleic  acids are NOT DETECTED.  The SARS-CoV-2 RNA is generally detectable in upper respiratory specimens during the acute phase of infection. The lowest concentration of SARS-CoV-2 viral copies this assay can detect is 138 copies/mL. A negative result does not preclude SARS-Cov-2 infection and should not be used as the sole basis for treatment or other patient management decisions. A negative result may occur with  improper specimen collection/handling, submission of specimen other than nasopharyngeal swab, presence of viral mutation(s) within the areas targeted by this assay, and inadequate number of viral copies(<138 copies/mL). A negative result must be combined with clinical observations, patient history, and epidemiological information. The expected result is Negative.  Fact Sheet for Patients:  BloggerCourse.com  Fact Sheet for Healthcare Providers:  SeriousBroker.it  This test is no t yet approved or cleared by the [place] FDA and  has been authorized for detection and/or diagnosis of SARS-CoV-2 by FDA under an Emergency Use Authorization (EUA). This EUA will remain  in effect (meaning this test can be used) for the duration of the COVID-19 declaration under Section 564(b)(1) of the Act, 21 U.S.C.section 360bbb-3(b)(1), unless the authorization is terminated  or revoked sooner.       Influenza A by PCR NEGATIVE NEGATIVE Final   Influenza B by PCR NEGATIVE NEGATIVE Final    Comment: (NOTE) The Xpert Xpress SARS-CoV-2/FLU/RSV plus assay is intended as an aid in the diagnosis of influenza from Nasopharyngeal swab specimens and should not be used as a sole basis for treatment. Nasal washings and aspirates are unacceptable for Xpert Xpress SARS-CoV-2/FLU/RSV testing.  Fact Sheet for Patients: BloggerCourse.com  Fact Sheet for Healthcare Providers: SeriousBroker.it  This  test is not yet approved or cleared by the [place] FDA and has been authorized for detection and/or diagnosis of SARS-CoV-2 by FDA under an Emergency Use Authorization (EUA). This EUA will remain in effect (meaning this test can be used) for the duration of the COVID-19 declaration under Section 564(b)(1) of the Act, 21 U.S.C. section 360bbb-3(b)(1), unless the authorization is terminated or revoked.  Performed at [facility], [address] [place], [place] [address]      Time coordinating discharge: Over 30 minutes  SIGNED:   Charise Killian, MD  [facility] 03/16/2021, 3:39 PM Pager   If 7PM-7AM, please contact night-coverage

## 2021-03-16 NOTE — Progress Notes (Signed)
[facility] Daily Progress Note  03/11/21: Right above-the-knee amputation  03/09/21: Open (guillotine) amputation of the right lower leg  Subjective: Patient without complaint this AM.  No acute issues overnight.  Family at bedside.  Objective: Vitals:   03/15/21 2043 03/15/21 2100 03/16/21 0519 03/16/21 0819  BP: (!) 123/101 (!) 141/77 119/63 120/75  Pulse: 60 66 71 92  Resp: 16  16 18   Temp: 98.6 F (37 C)  98.6 F (37 C) 97.9 F (36.6 C)  TempSrc: Oral  Oral Oral  SpO2: 100%  98% 98%  Weight:      Height:        Intake/Output Summary (Last 24 hours) at 03/16/2021 1158 Last data filed at 03/16/2021 0800 Gross per 24 hour  Intake 360 ml  Output 3000 ml  Net -2640 ml   Physical Exam: A&Ox3, NAD CV: RRR Pulmonary: CTA Bilaterally Abdomen: Soft, Nontender, Nondistended Vascular:             Right lower extremity: Thigh soft.  Operating room dressing removed.  Stump is healthy.  No drainage.  Staples are clean dry and intact.  Laboratory: CBC    Component Value Date/Time   WBC 10.0 03/15/2021 0723   HGB 8.3 (L) 03/16/2021 0416   HGB 14.7 02/24/2012 0325   HCT 24.3 (L) 03/15/2021 0723   HCT 44.4 02/24/2012 0325   PLT 240 03/15/2021 0723   PLT 108 (L) 02/24/2012 0325   BMET    Component Value Date/Time   NA 136 03/11/2021 0446   NA 143 02/24/2012 0325   K 4.3 03/11/2021 0446   K 4.1 02/24/2012 0325   CL 106 03/11/2021 0446   CL 108 (H) 02/24/2012 0325   CO2 25 03/11/2021 0446   CO2 26 02/24/2012 0325   GLUCOSE 107 (H) 03/11/2021 0446   GLUCOSE 120 (H) 02/24/2012 0325   BUN 33 (H) 03/11/2021 0446   BUN 25 (H) 02/24/2012 0325   CREATININE 0.98 03/14/2021 0330   CREATININE 1.47 (H) 02/24/2012 0325   CALCIUM 7.9 (L) 03/11/2021 0446   CALCIUM 9.1 02/24/2012 0325   GFRNONAA >60 03/14/2021 0330   GFRNONAA 48 (L) 02/24/2012 0325   GFRAA 55 (L) 02/24/2012 0325   Assessment/Planning: The patient is a 80 year old male who presented  to the [facility] emergency department with severe infection into the right foot as well as sepsis status post guillotine amputation and now right above-the-knee amputation   1) Stump is healthy and healing well.  Staples are clean dry and intact 2) Daily dressing changes.  Orders have been placed in epic. 3) Okay from a vascular standpoint to be discharged home when medically stable   Discussed with Dr. [facility] PA-C 03/16/2021 11:58 AM

## 2021-03-16 NOTE — Progress Notes (Signed)
Agree with Edward Jolly, RN assessment.  No significant changes noted.

## 2021-03-16 NOTE — TOC Transition Note (Signed)
Transition of Care [facility]) - CM/SW Discharge Note   Patient Details  Name: [name] [name] MRN: [id number] Date of Birth: 1940/12/10  [facility] CM/SW Contact:  Chapman Fitch, RN Phone Number: 03/16/2021, 3:56 PM   Clinical Narrative:      Patient will DC XT:[facility] Anticipated DC date:03/16/21  Family notified:Annie Transport by:EMS  Per MD patient ready for DC to . RN, patient, patient's family, and facility notified of DC. Discharge Summary sent to facility. RN given number for report. DC packet on chart. Ambulance transport requested for patient.  TOC signing off.  Bevelyn Ngo [facility] [phone]        Patient Goals and CMS Choice        Discharge Placement                       Discharge Plan and Services                                     Social Determinants of Health (SDOH) Interventions     Readmission Risk Interventions No flowsheet data found.

## 2021-03-16 NOTE — Discharge Summary (Incomplete)
Patient discharged to [facility] via EMS with no complications Noted. Nurse called [facility] 10 times and they did not answer. Report was not given due to center not answering. PIV X 2 removed. Patient has no concerns

## 2021-03-16 NOTE — Progress Notes (Signed)
Nutrition Follow Up Note   DOCUMENTATION CODES:   Non-severe (moderate) malnutrition in context of chronic illness  INTERVENTION:   Ensure Max protein supplement TID, each supplement provides 150kcal and 30g of protein.  Ocuvite daily for wound healing (provides zinc, vitamin A, vitamin C, Vitamin E, copper, and selenium)  Vitamin C 500mg  po BID   NUTRITION DIAGNOSIS:   Moderate Malnutrition related to chronic illness (CHF, DM) as evidenced by moderate fat depletion, moderate muscle depletion.  GOAL:   Patient will meet greater than or equal to 90% of their needs -progressing   MONITOR:   PO intake, Supplement acceptance, Labs, Weight trends, Skin, I & O's  ASSESSMENT:   80 y/o male with h/o Afib, CHF, PAD, DM, GERD and HTN who is admitted with diabetic foot wound.  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 No new weight since 11/18; will request daily weights. Recommend continue vitamins and supplements until wound healing complete. Plan is for [facility] at discharge; insurance authorization pending.   Medications reviewed and include: vitamin C, ferrous gluconate, lasix, ocuvite, B12  Labs reviewed: K 4.3 wnl, BUN 33(H), creat 1.28(H) Hgb 8.2(L), Hct 24.3(L)  Diet Order:   Diet Order             Diet regular Room service appropriate? Yes; Fluid consistency: Thin  Diet effective now                  EDUCATION NEEDS:   Education needs have been addressed  Skin:  Skin Assessment: Reviewed RN Assessment (incision R foot)  Last BM:  11/22- type 3  Height:   Ht Readings from Last 1 Encounters:  03/11/21 6' (1.829 m)    Weight:   Wt Readings from Last 1 Encounters:  03/11/21 86.2 kg    Ideal Body Weight:  80.9 kg  BMI:  Body mass index is 25.77 kg/m.  Estimated  Nutritional Needs:   Kcal:  2200-2400kcal/day  Protein:  110-125g/day  Fluid:  2.2-2.5L/day  03/13/21 MS, RD, LDN Please refer to [facility] for RD and/or RD on-call/weekend/after hours pager

## 2021-03-22 ENCOUNTER — Ambulatory Visit: Payer: Medicare HMO | Admitting: Physician Assistant

## 2021-03-23 ENCOUNTER — Other Ambulatory Visit (INDEPENDENT_AMBULATORY_CARE_PROVIDER_SITE_OTHER): Payer: Self-pay | Admitting: Vascular Surgery

## 2021-03-23 DIAGNOSIS — I70239 Atherosclerosis of native arteries of right leg with ulceration of unspecified site: Secondary | ICD-10-CM

## 2021-03-23 DIAGNOSIS — Z9862 Peripheral vascular angioplasty status: Secondary | ICD-10-CM

## 2021-03-24 ENCOUNTER — Encounter (INDEPENDENT_AMBULATORY_CARE_PROVIDER_SITE_OTHER): Payer: Medicare HMO

## 2021-03-24 ENCOUNTER — Encounter (INDEPENDENT_AMBULATORY_CARE_PROVIDER_SITE_OTHER): Payer: Medicare HMO | Admitting: Nurse Practitioner

## 2021-03-29 ENCOUNTER — Ambulatory Visit (INDEPENDENT_AMBULATORY_CARE_PROVIDER_SITE_OTHER): Payer: Medicare HMO

## 2021-03-29 ENCOUNTER — Ambulatory Visit: Payer: Medicare HMO | Admitting: Physician Assistant

## 2021-03-29 ENCOUNTER — Encounter (INDEPENDENT_AMBULATORY_CARE_PROVIDER_SITE_OTHER): Payer: Self-pay

## 2021-03-29 ENCOUNTER — Other Ambulatory Visit: Payer: Self-pay

## 2021-03-29 ENCOUNTER — Ambulatory Visit (INDEPENDENT_AMBULATORY_CARE_PROVIDER_SITE_OTHER): Payer: Medicare HMO | Admitting: Vascular Surgery

## 2021-03-29 VITALS — BP 96/65 | HR 79 | Ht 72.0 in | Wt 163.0 lb

## 2021-03-29 DIAGNOSIS — E119 Type 2 diabetes mellitus without complications: Secondary | ICD-10-CM

## 2021-03-29 DIAGNOSIS — I739 Peripheral vascular disease, unspecified: Secondary | ICD-10-CM

## 2021-03-29 DIAGNOSIS — Z89611 Acquired absence of right leg above knee: Secondary | ICD-10-CM

## 2021-03-29 NOTE — Assessment & Plan Note (Signed)
blood glucose control important in reducing the progression of atherosclerotic disease. Also, involved in wound healing. On appropriate medications.  

## 2021-03-29 NOTE — Progress Notes (Signed)
Patient ID: [name] [name], male   DOB: [birth year], 80 y.o.   MRN: [id number]  Chief Complaint  Patient presents with   Follow-up    1 mo post op     HPI [name] [name] is a 80 y.o. male.  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  He is about 3 to 4 weeks status post right AKA.  He is doing well.  He has minimal pain.  His wound is healing well.   Past Medical History:  Diagnosis Date   CHF (congestive heart failure) (HCC)    Diabetes (HCC)    Dysrhythmia    AFIB   HTN (hypertension)    Infection    LEG   Pancreatitis     Past Surgical History:  Procedure Laterality Date   AMPUTATION Right 03/09/2021   Procedure: AMPUTATION FOOT;  Surgeon: Annice Needy, MD;  Location: [facility];  Service: General;  Laterality: Right;   AMPUTATION Right 03/11/2021   Procedure: AMPUTATION ABOVE KNEE;  Surgeon: Annice Needy, MD;  Location: [facility];  Service: General;  Laterality: Right;   CATARACT EXTRACTION W/PHACO Right 03/08/2017   Procedure: CATARACT EXTRACTION PHACO AND INTRAOCULAR LENS PLACEMENT (IOC);  Surgeon: Nevada Crane, MD;  Location: [facility];  Service: Ophthalmology;  Laterality: Right;  Lot# [id number] H [place]: 00:30.9 AP%: 8.2 CDE: 2.54   CATARACT EXTRACTION W/PHACO Left 04/04/2017   Procedure: CATARACT EXTRACTION PHACO AND INTRAOCULAR LENS PLACEMENT (IOC);  Surgeon: Nevada Crane, MD;  Location: [facility];  Service: Ophthalmology;  Laterality: Left;  [place] 00:31.4 AP% 12.0 CDE 3.78 Fluid Pack lot # [id number]   CHOLECYSTECTOMY     EYE SURGERY     right eye   LOWER EXTREMITY ANGIOGRAPHY Right 02/10/2021   Procedure: LOWER EXTREMITY ANGIOGRAPHY;  Surgeon: Annice Needy, MD;  Location: [facility];  Service: Cardiovascular;  Laterality: Right;      No Known Allergies  Current Outpatient Medications  Medication Sig Dispense Refill   acetaminophen (TYLENOL) 500 MG tablet Take 500-1,000 mg by mouth every 6 (six) hours as needed for mild pain  or moderate pain.     atorvastatin (LIPITOR) 10 MG tablet Take 1 tablet (10 mg total) by mouth daily. 30 tablet 11   carvedilol (COREG) 6.25 MG tablet Take 6.25 mg 2 (two) times daily by mouth.     digoxin (LANOXIN) 0.125 MG tablet Take 0.125 mcg daily by mouth.     ferrous gluconate (FERGON) 324 MG tablet Take 1 tablet (324 mg total) by mouth 2 (two) times daily with a meal. 60 tablet 0   furosemide (LASIX) 20 MG tablet Take 20 mg daily by mouth.     gabapentin (NEURONTIN) 100 MG capsule Take 1 capsule (100 mg total) by mouth 3 (three) times daily. 90 capsule 0   levofloxacin (LEVAQUIN) 500 MG tablet Take 500 mg by mouth daily.     lisinopril (PRINIVIL,ZESTRIL) 10 MG tablet Take 15 mg by mouth daily.     metFORMIN (GLUCOPHAGE) 1000 MG tablet Take 1,000 mg by mouth 2 (two) times daily with a meal.     rivaroxaban (XARELTO) 20 MG TABS tablet Take 20 mg daily by mouth.     vitamin B-12 (CYANOCOBALAMIN) 1000 MCG tablet Take 1,000 mcg by mouth daily.     No current facility-administered medications for this visit.        Physical Exam BP 96/65   Pulse 79   Ht 6' (1.829 m)  Wt 163 lb (73.9 kg)   BMI 22.11 kg/m  Gen:  WD/WN, NAD Skin: incision C/D/I     Assessment/Plan:  Peripheral arterial disease (HCC) Had gangrene of the right foot.  Now status post right above-knee amputation.  Diabetes mellitus type 2, uncomplicated (HCC) blood glucose control important in reducing the progression of atherosclerotic disease. Also, involved in wound healing. On appropriate medications.   Hx of AKA (above knee amputation), right (HCC) Healing well.  Half of the staples were removed today.  The other half can be removed in 1 to 2 weeks.      Festus Barren 03/29/2021, 12:21 PM   This note was created with Dragon medical transcription system.  Any errors from dictation are unintentional.

## 2021-03-29 NOTE — Assessment & Plan Note (Signed)
Healing well.  Half of the staples were removed today.  The other half can be removed in 1 to 2 weeks.

## 2021-03-29 NOTE — Assessment & Plan Note (Signed)
Had gangrene of the right foot.  Now status post right above-knee amputation.

## 2021-04-05 ENCOUNTER — Encounter (INDEPENDENT_AMBULATORY_CARE_PROVIDER_SITE_OTHER): Payer: Medicare HMO | Admitting: Nurse Practitioner

## 2021-04-05 ENCOUNTER — Ambulatory Visit: Payer: Medicare HMO | Admitting: Physician Assistant

## 2021-04-05 DIAGNOSIS — M869 Osteomyelitis, unspecified: Secondary | ICD-10-CM | POA: Diagnosis not present

## 2021-04-05 DIAGNOSIS — R5381 Other malaise: Secondary | ICD-10-CM | POA: Diagnosis not present

## 2021-04-07 ENCOUNTER — Ambulatory Visit (INDEPENDENT_AMBULATORY_CARE_PROVIDER_SITE_OTHER): Payer: Medicare HMO | Admitting: Nurse Practitioner

## 2021-04-07 ENCOUNTER — Other Ambulatory Visit: Payer: Self-pay

## 2021-04-07 ENCOUNTER — Encounter (INDEPENDENT_AMBULATORY_CARE_PROVIDER_SITE_OTHER): Payer: Self-pay | Admitting: Nurse Practitioner

## 2021-04-07 VITALS — BP 118/77 | HR 64 | Resp 16

## 2021-04-07 DIAGNOSIS — Z89611 Acquired absence of right leg above knee: Secondary | ICD-10-CM

## 2021-04-12 ENCOUNTER — Ambulatory Visit: Payer: Medicare HMO | Admitting: Physician Assistant

## 2021-04-17 ENCOUNTER — Encounter (INDEPENDENT_AMBULATORY_CARE_PROVIDER_SITE_OTHER): Payer: Self-pay | Admitting: Nurse Practitioner

## 2021-04-17 NOTE — Progress Notes (Signed)
Subjective:    Patient ID: [name] [name], male    DOB: Jul 17, 1940, 80 y.o.   MRN: [id number] Chief Complaint  Patient presents with   Follow-up    1-2 week staple removal    Chapin Brester is a 80 y.o. male.  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  He is about 5 weeks status post right AKA.  He is doing well.  He has minimal pain.  His wound is healing well.  He previously had have been stable removed.   Review of Systems  Skin:  Positive for wound.  Neurological:  Positive for weakness.  All other systems reviewed and are negative.     Objective:   Physical Exam Vitals reviewed.  HENT:     Head: Normocephalic.  Cardiovascular:     Rate and Rhythm: Normal rate.  Pulmonary:     Effort: Pulmonary effort is normal.  Skin:    General: Skin is warm and dry.  Neurological:     Mental Status: He is alert and oriented to person, place, and time.     Motor: Weakness present.     Gait: Gait abnormal.  Psychiatric:        Mood and Affect: Mood normal.        Behavior: Behavior normal.        Thought Content: Thought content normal.        Judgment: Judgment normal.    BP 118/77 (BP Location: Left Arm)    Pulse 64    Resp 16   Past Medical History:  Diagnosis Date   CHF (congestive heart failure) (HCC)    Diabetes (HCC)    Dysrhythmia    AFIB   HTN (hypertension)    Infection    LEG   Pancreatitis     Social History   Socioeconomic History   Marital status: Married    Spouse name: Not on file   Number of children: Not on file   Years of education: Not on file   Highest education level: Not on file  Occupational History   Not on file  Tobacco Use   Smoking status: Never   Smokeless tobacco: Current    Types: Chew  Substance and Sexual Activity   Alcohol use: No    Alcohol/week: 0.0 standard drinks   Drug use: No   Sexual activity: Not on file  Other Topics Concern   Not on file  Social History Narrative   Not on file   Social  Determinants of Health   Financial Resource Strain: Not on file  Food Insecurity: Not on file  Transportation Needs: Not on file  Physical Activity: Not on file  Stress: Not on file  Social Connections: Not on file  Intimate Partner Violence: Not on file    Past Surgical History:  Procedure Laterality Date   AMPUTATION Right 03/09/2021   Procedure: AMPUTATION FOOT;  Surgeon: Annice Needy, MD;  Location: [facility];  Service: General;  Laterality: Right;   AMPUTATION Right 03/11/2021   Procedure: AMPUTATION ABOVE KNEE;  Surgeon: Annice Needy, MD;  Location: [facility];  Service: General;  Laterality: Right;   CATARACT EXTRACTION W/PHACO Right 03/08/2017   Procedure: CATARACT EXTRACTION PHACO AND INTRAOCULAR LENS PLACEMENT (IOC);  Surgeon: Nevada Crane, MD;  Location: [facility];  Service: Ophthalmology;  Laterality: Right;  Lot# [id number] H [place]: 00:30.9 AP%: 8.2 CDE: 2.54   CATARACT EXTRACTION W/PHACO Left 04/04/2017   Procedure: CATARACT EXTRACTION PHACO AND INTRAOCULAR  LENS PLACEMENT (IOC);  Surgeon: Eulogio Bear, MD;  Location: [facility];  Service: Ophthalmology;  Laterality: Left;  [place] 00:31.4 AP% 12.0 CDE 3.78 Fluid Pack lot # [id number]   CHOLECYSTECTOMY     EYE SURGERY     right eye   LOWER EXTREMITY ANGIOGRAPHY Right 02/10/2021   Procedure: LOWER EXTREMITY ANGIOGRAPHY;  Surgeon: Algernon Huxley, MD;  Location: [place] [facility];  Service: Cardiovascular;  Laterality: Right;    History reviewed. No pertinent family history.  No Known Allergies  CBC Latest Ref Rng & Units 03/16/2021 03/15/2021 03/14/2021  WBC 4.0 - 10.5 K/uL - 10.0 12.9(H)  Hemoglobin 13.0 - 17.0 g/dL 8.3(L) 8.2(L) 7.9(L)  Hematocrit 39.0 - 52.0 % - 24.3(L) 23.1(L)  Platelets 150 - 400 K/uL - 240 236      CMP     Component Value Date/Time   NA 136 03/11/2021 0446   NA 143 02/24/2012 0325   K 4.3 03/11/2021 0446   K 4.1 02/24/2012 0325   CL 106 03/11/2021 0446   CL 108 (H) 02/24/2012 0325    CO2 25 03/11/2021 0446   CO2 26 02/24/2012 0325   GLUCOSE 107 (H) 03/11/2021 0446   GLUCOSE 120 (H) 02/24/2012 0325   BUN 33 (H) 03/11/2021 0446   BUN 25 (H) 02/24/2012 0325   CREATININE 0.98 03/14/2021 0330   CREATININE 1.47 (H) 02/24/2012 0325   CALCIUM 7.9 (L) 03/11/2021 0446   CALCIUM 9.1 02/24/2012 0325   PROT 6.8 03/09/2021 0611   PROT 7.1 02/22/2012 1451   ALBUMIN 2.1 (L) 03/09/2021 0611   ALBUMIN 3.6 02/22/2012 1451   AST 32 03/09/2021 0611   AST 52 (H) 02/22/2012 1451   ALT 15 03/09/2021 0611   ALT 88 (H) 02/22/2012 1451   ALKPHOS 113 03/09/2021 0611   ALKPHOS 61 02/22/2012 1451   BILITOT 0.9 03/09/2021 0611   BILITOT 1.4 (H) 02/22/2012 1451   GFRNONAA >60 03/14/2021 0330   GFRNONAA 48 (L) 02/24/2012 0325   GFRAA 55 (L) 02/24/2012 0325     No results found.     Assessment & Plan:   1. Hx of AKA (above knee amputation), right ([place]) All staples removed today and the patient tolerated well.  We will have her return in 2 weeks for evaluation of the wound as well as discuss possible prosthetic.   Current Outpatient Medications on File Prior to Visit  Medication Sig Dispense Refill   acetaminophen (TYLENOL) 500 MG tablet Take 500-1,000 mg by mouth every 6 (six) hours as needed for mild pain or moderate pain.     atorvastatin (LIPITOR) 10 MG tablet Take 1 tablet (10 mg total) by mouth daily. 30 tablet 11   carvedilol (COREG) 6.25 MG tablet Take 6.25 mg 2 (two) times daily by mouth.     digoxin (LANOXIN) 0.125 MG tablet Take 0.125 mcg daily by mouth.     ferrous gluconate (FERGON) 324 MG tablet Take 1 tablet (324 mg total) by mouth 2 (two) times daily with a meal. 60 tablet 0   furosemide (LASIX) 20 MG tablet Take 20 mg daily by mouth.     gabapentin (NEURONTIN) 100 MG capsule Take 1 capsule (100 mg total) by mouth 3 (three) times daily. 90 capsule 0   levofloxacin (LEVAQUIN) 500 MG tablet Take 500 mg by mouth daily.     lisinopril (PRINIVIL,ZESTRIL) 10 MG tablet Take  15 mg by mouth daily.     metFORMIN (GLUCOPHAGE) 1000 MG tablet Take 1,000 mg  by mouth 2 (two) times daily with a meal.     rivaroxaban (XARELTO) 20 MG TABS tablet Take 20 mg daily by mouth.     vitamin B-12 (CYANOCOBALAMIN) 1000 MCG tablet Take 1,000 mcg by mouth daily.     No current facility-administered medications on file prior to visit.    There are no Patient Instructions on file for this visit. No follow-ups on file.   Georgiana Spinner, NP

## 2021-04-19 ENCOUNTER — Ambulatory Visit: Payer: Medicare HMO | Admitting: Physician Assistant

## 2021-04-19 NOTE — Progress Notes (Signed)
ZURI, BRADWAY ([id number]) Visit Report for 03/08/2021 HBO Details Patient Name: [name] [name], [name] [name] Date of Service: 03/08/2021 8:30 AM Medical Record Number: [id number] Patient Account Number: [id number] Date of Birth/Sex: 1940/07/03 (80 y.o. 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 HBO Treatment End 03/08/2021 Date: HBO Discharge Treatment Series Incomplete; Medical Event- Outcome: Unrelated to HBO HBO Treatment Details Treatment Number: 7 Patient Type: Outpatient Chamber Type: Monoplace Chamber Serial #: [id number] Treatment Protocol: 2.0 ATA with 90 minutes oxygen, and no air breaks Treatment Details Compression Rate Down: 1.5 psi / minute De-Compression Rate Up: 1.5 psi / minute Compress Tx Pressure Air breaks and breathing periods Decompress Decompress Begins Reached (leave unused spaces blank) Begins Ends Chamber Pressure (ATA) 1 2 - - - - - - 2 1 Clock Time (24 hr) 09:10 09:22 - - - - - - 10:52 11:02 Treatment Length: 112 (minutes) Treatment Segments: 4 Vital Signs Capillary Blood Glucose Reference Range: 80 - 120 mg / dl HBO Diabetic Blood Glucose Intervention Range: <131 mg/dl or >203 mg/dl Time Capillary Blood Glucose Pulse Blood Respiratory Action Type: Vitals Pulse: Temperature: Glucose Meter Oximetry Pressure: Rate: Taken: Taken: (mg/dl): #: (%) Gave Ensure per protocol; PA made Pre 08:30 104/60 78 18 97.9 128 1 aware Pre 09:00 152 1 Gave second Ensure per PA Treatment Response Treatment Toleration: Well Treatment Completion Treatment Completed without Adverse  Event Status: Treatment Notes Patient required amputation. HBO Attestation I certify that I supervised this HBO treatment in accordance with Medicare guidelines. A trained emergency response team is readily Yes available per hospital policies and procedures. Continue HBOT as ordered. No Electronic Signature(s) Signed: 03/30/2021 9:28:54 AM By: Elliot Gurney, BSN, RN, CWS, Kim RN, BSN Signed: 04/15/2021 12:11:03 PM By: Lenda Kelp PA-C Previous Signature: 03/08/2021 12:06:25 PM Version By: Aleda Grana RCP, RRT, CHT Previous Signature: 03/08/2021 4:51:13 PM Version By: Buren Kos [facility], [name] [name] ([id number]) Entered By: Elliot Gurney BSN, RN, CWS, Kim on 03/30/2021 09:28:54 [name] [name] ([id number]) -------------------------------------------------------------------------------- HBO Safety Checklist Details Patient Name: [name] [name] Date of Service: 03/08/2021 8:30 AM Medical Record Number: [id number] Patient Account Number: [id number] Date of Birth/Sex: 29-Aug-1940 (80 y.o. M) Treating RN: Primary Care Ranell Finelli: Evangeline Gula, Carollee Herter Other Clinician: Izetta Dakin Referring Vonna Brabson: Evangeline Gula, Carollee Herter Treating Shalanda Brogden/Extender: Rowan Blase in Treatment: 9 HBO Safety Checklist Items Safety Checklist Consent Form Signed Patient voided / foley secured and emptied When did you last eato 06:30 am Last dose of injectable or oral agent n/a Ostomy pouch emptied and vented if applicable NA All implantable devices assessed, documented and approved NA Intravenous access site secured and place NA Valuables secured Linens and cotton and cotton/polyester blend (less than 51% polyester) Personal oil-based products / skin lotions / body lotions removed Wigs or hairpieces removed NA Smoking or tobacco materials removed Books / newspapers / magazines / loose paper removed NA Cologne, aftershave, perfume and deodorant removed Jewelry removed (may wrap wedding  band) Make-up removed NA Hair care products removed Battery operated devices (external) removed NA Heating patches and chemical warmers removed NA Titanium eyewear removed NA Nail polish cured greater than 10 hours NA Casting material cured greater than 10 hours NA Hearing aids removed NA Loose dentures or partials removed  NA Prosthetics have been removed NA Patient demonstrates correct use of air break device (if applicable) Patient concerns have been addressed Patient grounding bracelet on and cord attached to chamber Specifics for Inpatients (complete in addition to above) Medication sheet sent with patient Intravenous medications needed or due during therapy sent with patient Drainage tubes (e.g. nasogastric tube or chest tube secured and vented) Endotracheal or Tracheotomy tube secured Cuff deflated of air and inflated with saline Airway suctioned Electronic Signature(s) Signed: 03/08/2021 12:06:25 PM By: Aleda Grana RCP, RRT, CHT Entered By: Aleda Grana on 03/08/2021 12:02:14

## 2021-04-21 ENCOUNTER — Ambulatory Visit (INDEPENDENT_AMBULATORY_CARE_PROVIDER_SITE_OTHER): Payer: Medicare HMO | Admitting: Nurse Practitioner

## 2021-04-26 ENCOUNTER — Ambulatory Visit (INDEPENDENT_AMBULATORY_CARE_PROVIDER_SITE_OTHER): Payer: Medicare HMO | Admitting: Nurse Practitioner

## 2021-04-26 ENCOUNTER — Encounter (INDEPENDENT_AMBULATORY_CARE_PROVIDER_SITE_OTHER): Payer: Self-pay | Admitting: Nurse Practitioner

## 2021-04-26 VITALS — BP 131/71 | HR 56 | Resp 16 | Ht 72.0 in | Wt 163.0 lb

## 2021-04-26 DIAGNOSIS — Z89611 Acquired absence of right leg above knee: Secondary | ICD-10-CM

## 2021-05-07 ENCOUNTER — Encounter (INDEPENDENT_AMBULATORY_CARE_PROVIDER_SITE_OTHER): Payer: Self-pay | Admitting: Nurse Practitioner

## 2021-05-07 NOTE — Progress Notes (Signed)
Subjective:    Patient ID: [name] [name], male    DOB: [birth year], 81 y.o.   MRN: [id number] Chief Complaint  Patient presents with   Follow-up    2 week wound check    [name] [name] is a 81 y.o. male.  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  He is doing well.  He has minimal pain.  His wound is healing well.  He previously had his staples removed.  Today his stump is completely healed with no evidence of dehiscence.   Review of Systems  Musculoskeletal:  Positive for gait problem.  All other systems reviewed and are negative.     Objective:   Physical Exam Vitals reviewed.  HENT:     Head: Normocephalic.  Cardiovascular:     Rate and Rhythm: Normal rate.  Pulmonary:     Effort: Pulmonary effort is normal.  Skin:    General: Skin is warm and dry.  Neurological:     Mental Status: He is alert and oriented to person, place, and time.     Motor: Weakness present.     Gait: Gait abnormal.  Psychiatric:        Mood and Affect: Mood normal.        Behavior: Behavior normal.        Thought Content: Thought content normal.        Judgment: Judgment normal.    BP 131/71 (BP Location: Left Arm)    Pulse (!) 56    Resp 16    Ht 6' (1.829 m)    Wt 163 lb (73.9 kg)    BMI 22.11 kg/m   Past Medical History:  Diagnosis Date   CHF (congestive heart failure) (HCC)    Diabetes (HCC)    Dysrhythmia    AFIB   HTN (hypertension)    Infection    LEG   Pancreatitis     Social History   Socioeconomic History   Marital status: Married    Spouse name: Not on file   Number of children: Not on file   Years of education: Not on file   Highest education level: Not on file  Occupational History   Not on file  Tobacco Use   Smoking status: Never   Smokeless tobacco: Current    Types: Chew  Substance and Sexual Activity   Alcohol use: No    Alcohol/week: 0.0 standard drinks   Drug use: No   Sexual activity:  Not on file  Other Topics Concern   Not on file  Social History Narrative   Not on file   Social Determinants of Health   Financial Resource Strain: Not on file  Food Insecurity: Not on file  Transportation Needs: Not on file  Physical Activity: Not on file  Stress: Not on file  Social Connections: Not on file  Intimate Partner Violence: Not on file    Past Surgical History:  Procedure Laterality Date   AMPUTATION Right 03/09/2021   Procedure: AMPUTATION FOOT;  Surgeon: Algernon Huxley, MD;  Location: [facility];  Service: General;  Laterality: Right;   AMPUTATION Right 03/11/2021   Procedure: AMPUTATION ABOVE KNEE;  Surgeon: Algernon Huxley, MD;  Location: [facility];  Service: General;  Laterality: Right;   CATARACT EXTRACTION W/PHACO Right 03/08/2017   Procedure: CATARACT EXTRACTION PHACO AND INTRAOCULAR LENS PLACEMENT (IOC);  Surgeon: Eulogio Bear, MD;  Location: [facility];  Service:  Ophthalmology;  Laterality: Right;  Lot# [id number] H [place]: 00:30.9 AP%: 8.2 CDE: 2.54   CATARACT EXTRACTION W/PHACO Left 04/04/2017   Procedure: CATARACT EXTRACTION PHACO AND INTRAOCULAR LENS PLACEMENT (IOC);  Surgeon: Nevada Crane, MD;  Location: [facility];  Service: Ophthalmology;  Laterality: Left;  [place] 00:31.4 AP% 12.0 CDE 3.78 Fluid Pack lot # [id number]   CHOLECYSTECTOMY     EYE SURGERY     right eye   LOWER EXTREMITY ANGIOGRAPHY Right 02/10/2021   Procedure: LOWER EXTREMITY ANGIOGRAPHY;  Surgeon: Annice Needy, MD;  Location: [facility];  Service: Cardiovascular;  Laterality: Right;    History reviewed. No pertinent family history.  No Known Allergies  CBC Latest Ref Rng & Units 03/16/2021 03/15/2021 03/14/2021  WBC 4.0 - 10.5 K/uL - 10.0 12.9(H)  Hemoglobin 13.0 - 17.0 g/dL 8.3(L) 8.2(L) 7.9(L)  Hematocrit 39.0 - 52.0 % - 24.3(L) 23.1(L)  Platelets 150 - 400 K/uL - 240 236      CMP     Component Value Date/Time   NA 136 03/11/2021 0446   NA 143 02/24/2012 0325   K  4.3 03/11/2021 0446   K 4.1 02/24/2012 0325   CL 106 03/11/2021 0446   CL 108 (H) 02/24/2012 0325   CO2 25 03/11/2021 0446   CO2 26 02/24/2012 0325   GLUCOSE 107 (H) 03/11/2021 0446   GLUCOSE 120 (H) 02/24/2012 0325   BUN 33 (H) 03/11/2021 0446   BUN 25 (H) 02/24/2012 0325   CREATININE 0.98 03/14/2021 0330   CREATININE 1.47 (H) 02/24/2012 0325   CALCIUM 7.9 (L) 03/11/2021 0446   CALCIUM 9.1 02/24/2012 0325   PROT 6.8 03/09/2021 0611   PROT 7.1 02/22/2012 1451   ALBUMIN 2.1 (L) 03/09/2021 0611   ALBUMIN 3.6 02/22/2012 1451   AST 32 03/09/2021 0611   AST 52 (H) 02/22/2012 1451   ALT 15 03/09/2021 0611   ALT 88 (H) 02/22/2012 1451   ALKPHOS 113 03/09/2021 0611   ALKPHOS 61 02/22/2012 1451   BILITOT 0.9 03/09/2021 0611   BILITOT 1.4 (H) 02/22/2012 1451   GFRNONAA >60 03/14/2021 0330   GFRNONAA 48 (L) 02/24/2012 0325   GFRAA 55 (L) 02/24/2012 0325     No results found.     Assessment & Plan:   1. Hx of AKA (above knee amputation), right [facility]) [name] [name] was seen for further evaluation for right above knee prosthesis.He is a 81 year old male who had amputation on 11/18/022.  At the time he is well healed and ready for fitting of new right knee prosthesis.  He is a highly motivated individual and should do well once fitted with prosthesis.  He has no problem returning to a K2 ambulator, which will allow him to walk inside and outside of his home and overload level barriers.   We will have her return in 6 months for ABIs.  Current Outpatient Medications on File Prior to Visit  Medication Sig Dispense Refill   acetaminophen (TYLENOL) 500 MG tablet Take 500-1,000 mg by mouth every 6 (six) hours as needed for mild pain or moderate pain.     carvedilol (COREG) 6.25 MG tablet Take 6.25 mg 2 (two) times daily by mouth.     digoxin (LANOXIN) 0.125 MG tablet Take 0.125 mcg daily by mouth.     furosemide (LASIX) 20 MG tablet Take 20 mg daily by mouth.     lisinopril (PRINIVIL,ZESTRIL)  10 MG tablet Take 15 mg by mouth daily.  metFORMIN (GLUCOPHAGE) 1000 MG tablet Take 1,000 mg by mouth 2 (two) times daily with a meal.     rivaroxaban (XARELTO) 20 MG TABS tablet Take 20 mg daily by mouth.     vitamin B-12 (CYANOCOBALAMIN) 1000 MCG tablet Take 1,000 mcg by mouth daily.     atorvastatin (LIPITOR) 10 MG tablet Take 1 tablet (10 mg total) by mouth daily. (Patient not taking: Reported on 04/26/2021) 30 tablet 11   ferrous gluconate (FERGON) 324 MG tablet Take 1 tablet (324 mg total) by mouth 2 (two) times daily with a meal. 60 tablet 0   gabapentin (NEURONTIN) 100 MG capsule Take 1 capsule (100 mg total) by mouth 3 (three) times daily. 90 capsule 0   levofloxacin (LEVAQUIN) 500 MG tablet Take 500 mg by mouth daily. (Patient not taking: Reported on 04/26/2021)     No current facility-administered medications on file prior to visit.    There are no Patient Instructions on file for this visit. No follow-ups on file.   Kris Hartmann, NP

## 2021-05-10 DIAGNOSIS — E1151 Type 2 diabetes mellitus with diabetic peripheral angiopathy without gangrene: Secondary | ICD-10-CM | POA: Diagnosis not present

## 2021-05-10 DIAGNOSIS — E785 Hyperlipidemia, unspecified: Secondary | ICD-10-CM | POA: Diagnosis not present

## 2021-05-10 DIAGNOSIS — I429 Cardiomyopathy, unspecified: Secondary | ICD-10-CM | POA: Diagnosis not present

## 2021-05-10 DIAGNOSIS — I10 Essential (primary) hypertension: Secondary | ICD-10-CM | POA: Diagnosis not present

## 2021-05-10 DIAGNOSIS — Z89611 Acquired absence of right leg above knee: Secondary | ICD-10-CM | POA: Diagnosis not present

## 2021-05-10 DIAGNOSIS — I110 Hypertensive heart disease with heart failure: Secondary | ICD-10-CM | POA: Diagnosis not present

## 2021-05-10 DIAGNOSIS — E119 Type 2 diabetes mellitus without complications: Secondary | ICD-10-CM | POA: Diagnosis not present

## 2021-05-10 DIAGNOSIS — D62 Acute posthemorrhagic anemia: Secondary | ICD-10-CM | POA: Diagnosis not present

## 2021-05-10 DIAGNOSIS — I5032 Chronic diastolic (congestive) heart failure: Secondary | ICD-10-CM | POA: Diagnosis not present

## 2021-05-10 DIAGNOSIS — Z23 Encounter for immunization: Secondary | ICD-10-CM | POA: Diagnosis not present

## 2021-05-10 DIAGNOSIS — E46 Unspecified protein-calorie malnutrition: Secondary | ICD-10-CM | POA: Diagnosis not present

## 2021-05-10 DIAGNOSIS — I4891 Unspecified atrial fibrillation: Secondary | ICD-10-CM | POA: Diagnosis not present

## 2021-07-06 DIAGNOSIS — I5032 Chronic diastolic (congestive) heart failure: Secondary | ICD-10-CM | POA: Diagnosis not present

## 2021-07-06 DIAGNOSIS — Z89611 Acquired absence of right leg above knee: Secondary | ICD-10-CM | POA: Diagnosis not present

## 2021-07-06 DIAGNOSIS — I428 Other cardiomyopathies: Secondary | ICD-10-CM | POA: Diagnosis not present

## 2021-07-06 DIAGNOSIS — N189 Chronic kidney disease, unspecified: Secondary | ICD-10-CM | POA: Diagnosis not present

## 2021-07-06 DIAGNOSIS — J45909 Unspecified asthma, uncomplicated: Secondary | ICD-10-CM | POA: Diagnosis not present

## 2021-07-06 DIAGNOSIS — I10 Essential (primary) hypertension: Secondary | ICD-10-CM | POA: Diagnosis not present

## 2021-07-06 DIAGNOSIS — E7800 Pure hypercholesterolemia, unspecified: Secondary | ICD-10-CM | POA: Diagnosis not present

## 2021-07-06 DIAGNOSIS — I480 Paroxysmal atrial fibrillation: Secondary | ICD-10-CM | POA: Diagnosis not present

## 2021-07-06 DIAGNOSIS — E119 Type 2 diabetes mellitus without complications: Secondary | ICD-10-CM | POA: Diagnosis not present

## 2021-07-12 DIAGNOSIS — I83023 Varicose veins of left lower extremity with ulcer of ankle: Secondary | ICD-10-CM | POA: Diagnosis not present

## 2021-07-12 DIAGNOSIS — Z89611 Acquired absence of right leg above knee: Secondary | ICD-10-CM | POA: Diagnosis not present

## 2021-07-12 DIAGNOSIS — L97329 Non-pressure chronic ulcer of left ankle with unspecified severity: Secondary | ICD-10-CM | POA: Diagnosis not present

## 2021-07-12 DIAGNOSIS — I739 Peripheral vascular disease, unspecified: Secondary | ICD-10-CM | POA: Diagnosis not present

## 2021-09-30 ENCOUNTER — Ambulatory Visit (INDEPENDENT_AMBULATORY_CARE_PROVIDER_SITE_OTHER): Payer: Medicare HMO | Admitting: Nurse Practitioner

## 2021-09-30 ENCOUNTER — Encounter (INDEPENDENT_AMBULATORY_CARE_PROVIDER_SITE_OTHER): Payer: Medicare HMO

## 2021-12-02 ENCOUNTER — Ambulatory Visit (INDEPENDENT_AMBULATORY_CARE_PROVIDER_SITE_OTHER): Payer: Medicare HMO | Admitting: Nurse Practitioner

## 2021-12-02 ENCOUNTER — Ambulatory Visit (INDEPENDENT_AMBULATORY_CARE_PROVIDER_SITE_OTHER): Payer: Medicare HMO

## 2021-12-02 ENCOUNTER — Encounter (INDEPENDENT_AMBULATORY_CARE_PROVIDER_SITE_OTHER): Payer: Self-pay | Admitting: Nurse Practitioner

## 2021-12-02 VITALS — BP 145/72 | HR 62 | Resp 18

## 2021-12-02 DIAGNOSIS — E7800 Pure hypercholesterolemia, unspecified: Secondary | ICD-10-CM

## 2021-12-02 DIAGNOSIS — I70239 Atherosclerosis of native arteries of right leg with ulceration of unspecified site: Secondary | ICD-10-CM

## 2021-12-02 DIAGNOSIS — Z9862 Peripheral vascular angioplasty status: Secondary | ICD-10-CM | POA: Diagnosis not present

## 2021-12-02 DIAGNOSIS — E119 Type 2 diabetes mellitus without complications: Secondary | ICD-10-CM

## 2021-12-02 DIAGNOSIS — Z89611 Acquired absence of right leg above knee: Secondary | ICD-10-CM | POA: Diagnosis not present

## 2021-12-02 DIAGNOSIS — I739 Peripheral vascular disease, unspecified: Secondary | ICD-10-CM

## 2021-12-02 NOTE — Progress Notes (Incomplete)
Subjective:    Patient ID: [name] [name], male    DOB: [birth year], 81 y.o.   MRN: [id number] No chief complaint on file.   HPI  Review of Systems     Objective:   Physical Exam  BP (!) 145/72 (BP Location: Right Arm)   Pulse 62   Resp 18   Past Medical History:  Diagnosis Date  . CHF (congestive heart failure) (HCC)   . Diabetes (HCC)   . Dysrhythmia    AFIB  . HTN (hypertension)   . Infection    LEG  . Pancreatitis     Social History   Socioeconomic History  . Marital status: Married    Spouse name: Not on file  . Number of children: Not on file  . Years of education: Not on file  . Highest education level: Not on file  Occupational History  . Not on file  Tobacco Use  . Smoking status: Never  . Smokeless tobacco: Current    Types: Chew  Substance and Sexual Activity  . Alcohol use: No    Alcohol/week: 0.0 standard drinks of alcohol  . Drug use: No  . Sexual activity: Not on file  Other Topics Concern  . Not on file  Social History Narrative  . Not on file   Social Determinants of Health   Financial Resource Strain: Not on file  Food Insecurity: Not on file  Transportation Needs: Not on file  Physical Activity: Not on file  Stress: Not on file  Social Connections: Not on file  Intimate Partner Violence: Not on file    Past Surgical History:  Procedure Laterality Date  . AMPUTATION Right 03/09/2021   Procedure: AMPUTATION FOOT;  Surgeon: Annice Needy, MD;  Location: [facility];  Service: General;  Laterality: Right;  . AMPUTATION Right 03/11/2021   Procedure: AMPUTATION ABOVE KNEE;  Surgeon: Annice Needy, MD;  Location: [facility];  Service: General;  Laterality: Right;  . CATARACT EXTRACTION W/PHACO Right 03/08/2017   Procedure: CATARACT EXTRACTION PHACO AND INTRAOCULAR LENS PLACEMENT (IOC);  Surgeon: Nevada Crane, MD;  Location: [facility];  Service: Ophthalmology;  Laterality: Right;  Lot# [id number] H [place]: 00:30.9 AP%: 8.2 CDE: 2.54  .  CATARACT EXTRACTION W/PHACO Left 04/04/2017   Procedure: CATARACT EXTRACTION PHACO AND INTRAOCULAR LENS PLACEMENT (IOC);  Surgeon: Nevada Crane, MD;  Location: [facility];  Service: Ophthalmology;  Laterality: Left;  [place] 00:31.4 AP% 12.0 CDE 3.78 Fluid Pack lot # [id number]  . CHOLECYSTECTOMY    . EYE SURGERY     right eye  . LOWER EXTREMITY ANGIOGRAPHY Right 02/10/2021   Procedure: LOWER EXTREMITY ANGIOGRAPHY;  Surgeon: Annice Needy, MD;  Location: [facility];  Service: Cardiovascular;  Laterality: Right;    History reviewed. No pertinent family history.  No Known Allergies     Latest Ref Rng & Units 03/16/2021    4:16 AM 03/15/2021    7:23 AM 03/14/2021    3:30 AM  CBC  WBC 4.0 - 10.5 K/uL  10.0  12.9   Hemoglobin 13.0 - 17.0 g/dL 8.3  8.2  7.9   Hematocrit 39.0 - 52.0 %  24.3  23.1   Platelets 150 - 400 K/uL  240  236       CMP     Component Value Date/Time   NA 136 03/11/2021 0446   NA 143 02/24/2012 0325   K 4.3 03/11/2021 0446   K 4.1 02/24/2012 0325   CL 106  03/11/2021 0446   CL 108 (H) 02/24/2012 0325   CO2 25 03/11/2021 0446   CO2 26 02/24/2012 0325   GLUCOSE 107 (H) 03/11/2021 0446   GLUCOSE 120 (H) 02/24/2012 0325   BUN 33 (H) 03/11/2021 0446   BUN 25 (H) 02/24/2012 0325   CREATININE 0.98 03/14/2021 0330   CREATININE 1.47 (H) 02/24/2012 0325   CALCIUM 7.9 (L) 03/11/2021 0446   CALCIUM 9.1 02/24/2012 0325   PROT 6.8 03/09/2021 0611   PROT 7.1 02/22/2012 1451   ALBUMIN 2.1 (L) 03/09/2021 0611   ALBUMIN 3.6 02/22/2012 1451   AST 32 03/09/2021 0611   AST 52 (H) 02/22/2012 1451   ALT 15 03/09/2021 0611   ALT 88 (H) 02/22/2012 1451   ALKPHOS 113 03/09/2021 0611   ALKPHOS 61 02/22/2012 1451   BILITOT 0.9 03/09/2021 0611   BILITOT 1.4 (H) 02/22/2012 1451   GFRNONAA >60 03/14/2021 0330   GFRNONAA 48 (L) 02/24/2012 0325   GFRAA 55 (L) 02/24/2012 0325     No results found.     Assessment & Plan:   1. Hx of AKA (above knee amputation),  right [facility]) Mr. Mecca was seen for further evaluation for right above knee prosthesis.He is a 81 year old male who had amputation on 11/18/022.  At the time he is well healed and ready for fitting of new right knee prosthesis.  He is a highly motivated individual and should do well once fitted with prosthesis.  He has no problem returning to a K2 ambulator, which will allow him to walk inside and outside of his home and overload level barriers.   2. Peripheral arterial disease (HCC)  Recommend:  The patient has evidence of atherosclerosis of the lower extremities with claudication.  The patient does not voice lifestyle limiting changes at this point in time.  Noninvasive studies do not suggest clinically significant change.  No invasive studies, angiography or surgery at this time The patient should continue walking and begin a more formal exercise program.  The patient should continue antiplatelet therapy and aggressive treatment of the lipid abnormalities  No changes in the patient's medications at this time  Continued surveillance is indicated as atherosclerosis is likely to progress with time.    The patient will continue follow up with noninvasive studies as ordered.    3. Type 2 diabetes mellitus without complication, unspecified whether long term insulin use (HCC) Continue hypoglycemic medications as already ordered, these medications have been reviewed and there are no changes at this time.  Hgb A1C to be monitored as already arranged by primary service   4. Hypercholesteremia Continue statin as ordered and reviewed, no changes at this time    Current Outpatient Medications on File Prior to Visit  Medication Sig Dispense Refill  . ascorbic acid (VITAMIN C) 500 MG tablet Take 500 mg by mouth daily.    Marland Kitchen atorvastatin (LIPITOR) 10 MG tablet Take 1 tablet (10 mg total) by mouth daily. 30 tablet 11  . carvedilol (COREG) 6.25 MG tablet Take 6.25 mg 2 (two) times daily by mouth.     . digoxin (LANOXIN) 0.125 MG tablet Take 0.125 mcg daily by mouth.    . ferrous sulfate 325 (65 FE) MG tablet Take by mouth.    . furosemide (LASIX) 20 MG tablet Take 20 mg daily by mouth.    . furosemide (LASIX) 20 MG tablet Take 1 tablet by mouth daily.    Marland Kitchen lisinopril (PRINIVIL,ZESTRIL) 10 MG tablet Take 15 mg by  mouth daily.    . metFORMIN (GLUCOPHAGE) 1000 MG tablet Take 1,000 mg by mouth 2 (two) times daily with a meal.    . rivaroxaban (XARELTO) 20 MG TABS tablet Take 20 mg daily by mouth.    . SSD 1 % cream Apply topically.    . vitamin B-12 (CYANOCOBALAMIN) 1000 MCG tablet Take 1,000 mcg by mouth daily.    . ferrous gluconate (FERGON) 324 MG tablet Take 1 tablet (324 mg total) by mouth 2 (two) times daily with a meal. 60 tablet 0  . gabapentin (NEURONTIN) 100 MG capsule Take 1 capsule (100 mg total) by mouth 3 (three) times daily. 90 capsule 0   No current facility-administered medications on file prior to visit.    There are no Patient Instructions on file for this visit. No follow-ups on file.   Georgiana Spinner, NP

## 2021-12-02 NOTE — Progress Notes (Signed)
Subjective:    Patient ID: [name] [name], male    DOB: 1941/04/23, 81 y.o.   MRN: [id number] No chief complaint on file.   [name] [name] is a 81 y.o. male.  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  He is about 6 months status post right AKA.  He is doing well.  He has minimal pain.  His wound is healing well.  He previously had his staples removed.  Today his stump is completely healed with no evidence of dehiscence.  He denies any pain or discomfort.  Currently the patient has an ABI of 1.06 on the left.  He has a TBI of 0.3.  He has triphasic/biphasic waveforms with normal toe waveforms in the left foot.    Review of Systems  Musculoskeletal:  Positive for gait problem.  Neurological:  Positive for weakness.  All other systems reviewed and are negative.      Objective:   Physical Exam Vitals reviewed.  HENT:     Head: Normocephalic.  Cardiovascular:     Rate and Rhythm: Normal rate.  Pulmonary:     Effort: Pulmonary effort is normal.  Skin:    General: Skin is warm and dry.  Neurological:     Mental Status: He is alert and oriented to person, place, and time.  Psychiatric:        Mood and Affect: Mood normal.        Behavior: Behavior normal.        Thought Content: Thought content normal.        Judgment: Judgment normal.     BP (!) 145/72 (BP Location: Right Arm)   Pulse 62   Resp 18   Past Medical History:  Diagnosis Date   CHF (congestive heart failure) (HCC)    Diabetes (HCC)    Dysrhythmia    AFIB   HTN (hypertension)    Infection    LEG   Pancreatitis     Social History   Socioeconomic History   Marital status: Married    Spouse name: Not on file   Number of children: Not on file   Years of education: Not on file   Highest education level: Not on file  Occupational History   Not on file  Tobacco Use   Smoking status: Never   Smokeless tobacco: Current    Types: Chew  Substance and Sexual Activity   Alcohol use: No     Alcohol/week: 0.0 standard drinks of alcohol   Drug use: No   Sexual activity: Not on file  Other Topics Concern   Not on file  Social History Narrative   Not on file   Social Determinants of Health   Financial Resource Strain: Not on file  Food Insecurity: Not on file  Transportation Needs: Not on file  Physical Activity: Not on file  Stress: Not on file  Social Connections: Not on file  Intimate Partner Violence: Not on file    Past Surgical History:  Procedure Laterality Date   AMPUTATION Right 03/09/2021   Procedure: AMPUTATION FOOT;  Surgeon: Annice Needy, MD;  Location: [facility];  Service: General;  Laterality: Right;   AMPUTATION Right 03/11/2021   Procedure: AMPUTATION ABOVE KNEE;  Surgeon: Annice Needy, MD;  Location: [facility];  Service: General;  Laterality: Right;   CATARACT EXTRACTION W/PHACO Right 03/08/2017   Procedure: CATARACT EXTRACTION PHACO AND INTRAOCULAR LENS PLACEMENT (IOC);  Surgeon: Nevada Crane, MD;  Location: [facility];  Service: Ophthalmology;  Laterality: Right;  Lot# [id number] H [place]: 00:30.9 AP%: 8.2 CDE: 2.54   CATARACT EXTRACTION W/PHACO Left 04/04/2017   Procedure: CATARACT EXTRACTION PHACO AND INTRAOCULAR LENS PLACEMENT (IOC);  Surgeon: Nevada Crane, MD;  Location: [facility];  Service: Ophthalmology;  Laterality: Left;  [place] 00:31.4 AP% 12.0 CDE 3.78 Fluid Pack lot # [id number]   CHOLECYSTECTOMY     EYE SURGERY     right eye   LOWER EXTREMITY ANGIOGRAPHY Right 02/10/2021   Procedure: LOWER EXTREMITY ANGIOGRAPHY;  Surgeon: Annice Needy, MD;  Location: [facility];  Service: Cardiovascular;  Laterality: Right;    History reviewed. No pertinent family history.  No Known Allergies     Latest Ref Rng & Units 03/16/2021    4:16 AM 03/15/2021    7:23 AM 03/14/2021    3:30 AM  CBC  WBC 4.0 - 10.5 K/uL  10.0  12.9   Hemoglobin 13.0 - 17.0 g/dL 8.3  8.2  7.9   Hematocrit 39.0 - 52.0 %  24.3  23.1   Platelets 150 - 400 K/uL   240  236       CMP     Component Value Date/Time   NA 136 03/11/2021 0446   NA 143 02/24/2012 0325   K 4.3 03/11/2021 0446   K 4.1 02/24/2012 0325   CL 106 03/11/2021 0446   CL 108 (H) 02/24/2012 0325   CO2 25 03/11/2021 0446   CO2 26 02/24/2012 0325   GLUCOSE 107 (H) 03/11/2021 0446   GLUCOSE 120 (H) 02/24/2012 0325   BUN 33 (H) 03/11/2021 0446   BUN 25 (H) 02/24/2012 0325   CREATININE 0.98 03/14/2021 0330   CREATININE 1.47 (H) 02/24/2012 0325   CALCIUM 7.9 (L) 03/11/2021 0446   CALCIUM 9.1 02/24/2012 0325   PROT 6.8 03/09/2021 0611   PROT 7.1 02/22/2012 1451   ALBUMIN 2.1 (L) 03/09/2021 0611   ALBUMIN 3.6 02/22/2012 1451   AST 32 03/09/2021 0611   AST 52 (H) 02/22/2012 1451   ALT 15 03/09/2021 0611   ALT 88 (H) 02/22/2012 1451   ALKPHOS 113 03/09/2021 0611   ALKPHOS 61 02/22/2012 1451   BILITOT 0.9 03/09/2021 0611   BILITOT 1.4 (H) 02/22/2012 1451   GFRNONAA >60 03/14/2021 0330   GFRNONAA 48 (L) 02/24/2012 0325   GFRAA 55 (L) 02/24/2012 0325     No results found.     Assessment & Plan:   1. Hx of AKA (above knee amputation), right [facility]) [name] was seen for further evaluation for right above knee prosthesis.He is a 81 year old male who had amputation on 11/18/022.  At the time he is well healed and ready for fitting of new right knee prosthesis.  He is a highly motivated individual and should do well once fitted with prosthesis.  He has no problem returning to a K2 ambulator, which will allow him to walk inside and outside of his home and overload level barriers.   2. Peripheral arterial disease (HCC)  Recommend:  The patient has evidence of atherosclerosis of the lower extremities with claudication.  The patient does not voice lifestyle limiting changes at this point in time.  Noninvasive studies do not suggest clinically significant change.  No invasive studies, angiography or surgery at this time The patient should continue walking and begin a more  formal exercise program.  The patient should continue antiplatelet therapy and aggressive treatment of the lipid abnormalities  No changes in the patient's  medications at this time  Continued surveillance is indicated as atherosclerosis is likely to progress with time.    The patient will continue follow up with noninvasive studies as ordered.    3. Type 2 diabetes mellitus without complication, unspecified whether long term insulin use (HCC) Continue hypoglycemic medications as already ordered, these medications have been reviewed and there are no changes at this time.  Hgb A1C to be monitored as already arranged by primary service   4. Hypercholesteremia Continue statin as ordered and reviewed, no changes at this time    Current Outpatient Medications on File Prior to Visit  Medication Sig Dispense Refill   ascorbic acid (VITAMIN C) 500 MG tablet Take 500 mg by mouth daily.     atorvastatin (LIPITOR) 10 MG tablet Take 1 tablet (10 mg total) by mouth daily. 30 tablet 11   carvedilol (COREG) 6.25 MG tablet Take 6.25 mg 2 (two) times daily by mouth.     digoxin (LANOXIN) 0.125 MG tablet Take 0.125 mcg daily by mouth.     ferrous sulfate 325 (65 FE) MG tablet Take by mouth.     furosemide (LASIX) 20 MG tablet Take 20 mg daily by mouth.     furosemide (LASIX) 20 MG tablet Take 1 tablet by mouth daily.     lisinopril (PRINIVIL,ZESTRIL) 10 MG tablet Take 15 mg by mouth daily.     metFORMIN (GLUCOPHAGE) 1000 MG tablet Take 1,000 mg by mouth 2 (two) times daily with a meal.     rivaroxaban (XARELTO) 20 MG TABS tablet Take 20 mg daily by mouth.     SSD 1 % cream Apply topically.     vitamin B-12 (CYANOCOBALAMIN) 1000 MCG tablet Take 1,000 mcg by mouth daily.     ferrous gluconate (FERGON) 324 MG tablet Take 1 tablet (324 mg total) by mouth 2 (two) times daily with a meal. 60 tablet 0   gabapentin (NEURONTIN) 100 MG capsule Take 1 capsule (100 mg total) by mouth 3 (three) times daily. 90  capsule 0   No current facility-administered medications on file prior to visit.    There are no Patient Instructions on file for this visit. No follow-ups on file.   Georgiana Spinner, NP

## 2021-12-03 ENCOUNTER — Encounter (INDEPENDENT_AMBULATORY_CARE_PROVIDER_SITE_OTHER): Payer: Self-pay | Admitting: Nurse Practitioner

## 2021-12-04 DIAGNOSIS — R262 Difficulty in walking, not elsewhere classified: Secondary | ICD-10-CM | POA: Diagnosis not present

## 2021-12-04 DIAGNOSIS — Z89611 Acquired absence of right leg above knee: Secondary | ICD-10-CM | POA: Diagnosis not present

## 2021-12-04 DIAGNOSIS — Z4781 Encounter for orthopedic aftercare following surgical amputation: Secondary | ICD-10-CM | POA: Diagnosis not present

## 2021-12-15 DIAGNOSIS — E785 Hyperlipidemia, unspecified: Secondary | ICD-10-CM | POA: Diagnosis not present

## 2021-12-15 DIAGNOSIS — Z1389 Encounter for screening for other disorder: Secondary | ICD-10-CM | POA: Diagnosis not present

## 2021-12-15 DIAGNOSIS — I429 Cardiomyopathy, unspecified: Secondary | ICD-10-CM | POA: Diagnosis not present

## 2021-12-15 DIAGNOSIS — E119 Type 2 diabetes mellitus without complications: Secondary | ICD-10-CM | POA: Diagnosis not present

## 2021-12-15 DIAGNOSIS — D638 Anemia in other chronic diseases classified elsewhere: Secondary | ICD-10-CM | POA: Diagnosis not present

## 2021-12-15 DIAGNOSIS — E559 Vitamin D deficiency, unspecified: Secondary | ICD-10-CM | POA: Diagnosis not present

## 2021-12-15 DIAGNOSIS — Z0000 Encounter for general adult medical examination without abnormal findings: Secondary | ICD-10-CM | POA: Diagnosis not present

## 2021-12-15 DIAGNOSIS — Z1329 Encounter for screening for other suspected endocrine disorder: Secondary | ICD-10-CM | POA: Diagnosis not present

## 2021-12-15 DIAGNOSIS — I10 Essential (primary) hypertension: Secondary | ICD-10-CM | POA: Diagnosis not present

## 2021-12-15 DIAGNOSIS — I5032 Chronic diastolic (congestive) heart failure: Secondary | ICD-10-CM | POA: Diagnosis not present

## 2021-12-15 DIAGNOSIS — I739 Peripheral vascular disease, unspecified: Secondary | ICD-10-CM | POA: Diagnosis not present

## 2021-12-15 DIAGNOSIS — I4891 Unspecified atrial fibrillation: Secondary | ICD-10-CM | POA: Diagnosis not present

## 2022-01-04 DIAGNOSIS — Z89611 Acquired absence of right leg above knee: Secondary | ICD-10-CM | POA: Diagnosis not present

## 2022-01-04 DIAGNOSIS — Z4781 Encounter for orthopedic aftercare following surgical amputation: Secondary | ICD-10-CM | POA: Diagnosis not present

## 2022-01-04 DIAGNOSIS — R262 Difficulty in walking, not elsewhere classified: Secondary | ICD-10-CM | POA: Diagnosis not present

## 2022-01-11 DIAGNOSIS — I4891 Unspecified atrial fibrillation: Secondary | ICD-10-CM | POA: Diagnosis not present

## 2022-01-11 DIAGNOSIS — E785 Hyperlipidemia, unspecified: Secondary | ICD-10-CM | POA: Diagnosis not present

## 2022-01-11 DIAGNOSIS — D638 Anemia in other chronic diseases classified elsewhere: Secondary | ICD-10-CM | POA: Diagnosis not present

## 2022-01-11 DIAGNOSIS — N189 Chronic kidney disease, unspecified: Secondary | ICD-10-CM | POA: Diagnosis not present

## 2022-01-11 DIAGNOSIS — E119 Type 2 diabetes mellitus without complications: Secondary | ICD-10-CM | POA: Diagnosis not present

## 2022-01-11 DIAGNOSIS — E7800 Pure hypercholesterolemia, unspecified: Secondary | ICD-10-CM | POA: Diagnosis not present

## 2022-01-11 DIAGNOSIS — I5032 Chronic diastolic (congestive) heart failure: Secondary | ICD-10-CM | POA: Diagnosis not present

## 2022-01-11 DIAGNOSIS — I739 Peripheral vascular disease, unspecified: Secondary | ICD-10-CM | POA: Diagnosis not present

## 2022-01-11 DIAGNOSIS — I10 Essential (primary) hypertension: Secondary | ICD-10-CM | POA: Diagnosis not present

## 2022-01-11 DIAGNOSIS — I428 Other cardiomyopathies: Secondary | ICD-10-CM | POA: Diagnosis not present

## 2022-01-11 DIAGNOSIS — J45909 Unspecified asthma, uncomplicated: Secondary | ICD-10-CM | POA: Diagnosis not present

## 2022-01-11 DIAGNOSIS — E559 Vitamin D deficiency, unspecified: Secondary | ICD-10-CM | POA: Diagnosis not present

## 2022-01-11 DIAGNOSIS — I480 Paroxysmal atrial fibrillation: Secondary | ICD-10-CM | POA: Diagnosis not present

## 2022-01-11 DIAGNOSIS — I429 Cardiomyopathy, unspecified: Secondary | ICD-10-CM | POA: Diagnosis not present

## 2022-02-02 DIAGNOSIS — Z89611 Acquired absence of right leg above knee: Secondary | ICD-10-CM | POA: Diagnosis not present

## 2022-04-06 DIAGNOSIS — I739 Peripheral vascular disease, unspecified: Secondary | ICD-10-CM | POA: Diagnosis not present

## 2022-04-06 DIAGNOSIS — I10 Essential (primary) hypertension: Secondary | ICD-10-CM | POA: Diagnosis not present

## 2022-04-06 DIAGNOSIS — E785 Hyperlipidemia, unspecified: Secondary | ICD-10-CM | POA: Diagnosis not present

## 2022-04-06 DIAGNOSIS — I4891 Unspecified atrial fibrillation: Secondary | ICD-10-CM | POA: Diagnosis not present

## 2022-04-06 DIAGNOSIS — E119 Type 2 diabetes mellitus without complications: Secondary | ICD-10-CM | POA: Diagnosis not present

## 2022-06-06 ENCOUNTER — Other Ambulatory Visit (INDEPENDENT_AMBULATORY_CARE_PROVIDER_SITE_OTHER): Payer: Self-pay | Admitting: Nurse Practitioner

## 2022-06-06 ENCOUNTER — Ambulatory Visit (INDEPENDENT_AMBULATORY_CARE_PROVIDER_SITE_OTHER): Payer: Medicare HMO

## 2022-06-06 ENCOUNTER — Ambulatory Visit (INDEPENDENT_AMBULATORY_CARE_PROVIDER_SITE_OTHER): Payer: Medicare HMO | Admitting: Nurse Practitioner

## 2022-06-06 ENCOUNTER — Encounter (INDEPENDENT_AMBULATORY_CARE_PROVIDER_SITE_OTHER): Payer: Self-pay | Admitting: Nurse Practitioner

## 2022-06-06 VITALS — BP 101/67 | HR 66 | Resp 15

## 2022-06-06 DIAGNOSIS — I10 Essential (primary) hypertension: Secondary | ICD-10-CM

## 2022-06-06 DIAGNOSIS — I739 Peripheral vascular disease, unspecified: Secondary | ICD-10-CM

## 2022-06-06 DIAGNOSIS — E119 Type 2 diabetes mellitus without complications: Secondary | ICD-10-CM

## 2022-06-06 DIAGNOSIS — E785 Hyperlipidemia, unspecified: Secondary | ICD-10-CM

## 2022-06-06 DIAGNOSIS — I70262 Atherosclerosis of native arteries of extremities with gangrene, left leg: Secondary | ICD-10-CM | POA: Diagnosis not present

## 2022-06-06 NOTE — Progress Notes (Signed)
Subjective:    Patient ID: [name] [name], male    DOB: 18-Jul-1940, 82 y.o.   MRN: [id number] Chief Complaint  Patient presents with   Follow-up    Ultrasound follow up    [name] [name] is an 82 year old male returns to the office for followup and review of the noninvasive studies.   The patient notes that there has been a significant deterioration in the lower extremity symptoms.  The patient notes interval shortening of their claudication distance and development of mild rest pain symptoms. No new ulcers or wounds have occurred since the last visit.  Evaluation of his foot he has gangrenous changes to his left second toe.  He notes that this has been present for 1 to 2 weeks.  The patient is a fairly poor historian.  He also has 2 small wounds near his ankle area.  There have been no significant changes to the patient's overall health care.  The patient denies amaurosis fugax or recent TIA symptoms. There are no recent neurological changes noted. There is no history of DVT, PE or superficial thrombophlebitis. The patient denies recent episodes of angina or shortness of breath.   ABI's Rt=AKA and Lt=0.33 (previous ABI's Rt=AKA and Lt=1.11) the patient's left TBI is 0 Duplex US of the left lower extremity arterial system shows monophasic tibial artery waveforms with flat toe waveforms.    Review of Systems  Skin:  Positive for wound.  Neurological:  Positive for weakness.  All other systems reviewed and are negative.      Objective:   Physical Exam Vitals reviewed.  HENT:     Head: Normocephalic.  Cardiovascular:     Rate and Rhythm: Normal rate.     Pulses:          Dorsalis pedis pulses are 0 on the left side.       Posterior tibial pulses are 0 on the left side.  Pulmonary:     Effort: Pulmonary effort is normal.  Musculoskeletal:       Feet:     Right Lower Extremity: Right leg is amputated above knee.  Skin:    General: Skin is dry.  Neurological:     Mental  Status: He is alert and oriented to person, place, and time.     Motor: Weakness present.     Gait: Gait abnormal.  Psychiatric:        Mood and Affect: Mood normal.        Behavior: Behavior normal.        Thought Content: Thought content normal.        Judgment: Judgment normal.         BP 101/67 (BP Location: Left Arm)   Pulse 66   Resp 15   Past Medical History:  Diagnosis Date   CHF (congestive heart failure) (HCC)    Diabetes (HCC)    Dysrhythmia    AFIB   HTN (hypertension)    Infection    LEG   Pancreatitis     Social History   Socioeconomic History   Marital status: Married    Spouse name: Not on file   Number of children: Not on file   Years of education: Not on file   Highest education level: Not on file  Occupational History   Not on file  Tobacco Use   Smoking status: Never   Smokeless tobacco: Current    Types: Chew  Substance and Sexual Activity   Alcohol use: No  Alcohol/week: 0.0 standard drinks of alcohol   Drug use: No   Sexual activity: Not on file  Other Topics Concern   Not on file  Social History Narrative   Not on file   Social Determinants of Health   Financial Resource Strain: Not on file  Food Insecurity: Not on file  Transportation Needs: Not on file  Physical Activity: Not on file  Stress: Not on file  Social Connections: Not on file  Intimate Partner Violence: Not on file    Past Surgical History:  Procedure Laterality Date   AMPUTATION Right 03/09/2021   Procedure: AMPUTATION FOOT;  Surgeon: Algernon Huxley, MD;  Location: [facility];  Service: General;  Laterality: Right;   AMPUTATION Right 03/11/2021   Procedure: AMPUTATION ABOVE KNEE;  Surgeon: Algernon Huxley, MD;  Location: [facility];  Service: General;  Laterality: Right;   CATARACT EXTRACTION W/PHACO Right 03/08/2017   Procedure: CATARACT EXTRACTION PHACO AND INTRAOCULAR LENS PLACEMENT ([place]);  Surgeon: Eulogio Bear, MD;  Location: [facility];  Service:  Ophthalmology;  Laterality: Right;  Lot# [id number] [place]: 00:30.9 AP%: 8.2 CDE: 2.54   CATARACT EXTRACTION W/PHACO Left 04/04/2017   Procedure: CATARACT EXTRACTION PHACO AND INTRAOCULAR LENS PLACEMENT ([facility]);  Surgeon: Eulogio Bear, MD;  Location: [facility];  Service: Ophthalmology;  Laterality: Left;  [place] 00:31.4 AP% 12.0 CDE 3.78 Fluid Pack lot # [id number]   CHOLECYSTECTOMY     EYE SURGERY     right eye   LOWER EXTREMITY ANGIOGRAPHY Right 02/10/2021   Procedure: LOWER EXTREMITY ANGIOGRAPHY;  Surgeon: Algernon Huxley, MD;  Location: [facility];  Service: Cardiovascular;  Laterality: Right;    History reviewed. No pertinent family history.  No Known Allergies     Latest Ref Rng & Units 03/16/2021    4:16 AM 03/15/2021    7:23 AM 03/14/2021    3:30 AM  CBC  WBC 4.0 - 10.5 K/uL  10.0  12.9   Hemoglobin 13.0 - 17.0 g/dL 8.3  8.2  7.9   Hematocrit 39.0 - 52.0 %  24.3  23.1   Platelets 150 - 400 K/uL  240  236       CMP     Component Value Date/Time   NA 136 03/11/2021 0446   NA 143 02/24/2012 0325   K 4.3 03/11/2021 0446   K 4.1 02/24/2012 0325   CL 106 03/11/2021 0446   CL 108 (H) 02/24/2012 0325   CO2 25 03/11/2021 0446   CO2 26 02/24/2012 0325   GLUCOSE 107 (H) 03/11/2021 0446   GLUCOSE 120 (H) 02/24/2012 0325   BUN 33 (H) 03/11/2021 0446   BUN 25 (H) 02/24/2012 0325   CREATININE 0.98 03/14/2021 0330   CREATININE 1.47 (H) 02/24/2012 0325   CALCIUM 7.9 (L) 03/11/2021 0446   CALCIUM 9.1 02/24/2012 0325   PROT 6.8 03/09/2021 0611   PROT 7.1 02/22/2012 1451   ALBUMIN 2.1 (L) 03/09/2021 0611   ALBUMIN 3.6 02/22/2012 1451   AST 32 03/09/2021 0611   AST 52 (H) 02/22/2012 1451   ALT 15 03/09/2021 0611   ALT 88 (H) 02/22/2012 1451   ALKPHOS 113 03/09/2021 0611   ALKPHOS 61 02/22/2012 1451   BILITOT 0.9 03/09/2021 0611   BILITOT 1.4 (H) 02/22/2012 1451   GFRNONAA >60 03/14/2021 0330   GFRNONAA 48 (L) 02/24/2012 0325   GFRAA 55 (L) 02/24/2012 0325      No results found.     Assessment & Plan:  1. Atherosclerosis of native artery of left lower extremity with gangrene ([place])  Recommend:  The patient has evidence of severe atherosclerotic changes of both lower extremities associated with ulceration and tissue loss of the left foot.  This represents a limb threatening ischemia and places the patient at the risk for left limb loss.  Patient should undergo angiography of the left lower extremity with the hope for intervention for limb salvage.  The risks and benefits as well as the alternative therapies was discussed in detail with the patient.  All questions were answered.  Patient agrees to proceed with left angiography.  The patient will follow up with me in the office after the procedure.  - VAS [place] ABI WITH/WO TBI  2. Hypertension, unspecified type Continue antihypertensive medications as already ordered, these medications have been reviewed and there are no changes at this time.  3. Type 2 diabetes mellitus without complication, unspecified whether long term insulin use ([place]) Continue hypoglycemic medications as already ordered, these medications have been reviewed and there are no changes at this time.  Hgb A1C to be monitored as already arranged by primary service  4. Hyperlipidemia, unspecified hyperlipidemia type Continue statin as ordered and reviewed, no changes at this time   Current Outpatient Medications on File Prior to Visit  Medication Sig Dispense Refill   ascorbic acid (VITAMIN C) 500 MG tablet Take 500 mg by mouth daily.     atorvastatin (LIPITOR) 10 MG tablet Take 1 tablet (10 mg total) by mouth daily. 30 tablet 11   carvedilol (COREG) 6.25 MG tablet Take 6.25 mg 2 (two) times daily by mouth.     digoxin (LANOXIN) 0.125 MG tablet Take 0.125 mcg daily by mouth.     ferrous gluconate (FERGON) 324 MG tablet Take 1 tablet (324 mg total) by mouth 2 (two) times daily with a meal. 60 tablet 0   ferrous sulfate 325  (65 FE) MG tablet Take by mouth.     furosemide (LASIX) 20 MG tablet Take 20 mg daily by mouth.     furosemide (LASIX) 20 MG tablet Take 1 tablet by mouth daily.     gabapentin (NEURONTIN) 100 MG capsule Take 1 capsule (100 mg total) by mouth 3 (three) times daily. 90 capsule 0   lisinopril (PRINIVIL,ZESTRIL) 10 MG tablet Take 15 mg by mouth daily.     metFORMIN (GLUCOPHAGE) 1000 MG tablet Take 1,000 mg by mouth 2 (two) times daily with a meal.     rivaroxaban (XARELTO) 20 MG TABS tablet Take 20 mg daily by mouth.     SSD 1 % cream Apply topically.     vitamin B-12 (CYANOCOBALAMIN) 1000 MCG tablet Take 1,000 mcg by mouth daily.     No current facility-administered medications on file prior to visit.    There are no Patient Instructions on file for this visit. No follow-ups on file.   Kris Hartmann, NP

## 2022-06-06 NOTE — H&P (View-Only) (Signed)
 Subjective:    Patient ID: [name] [name], male    DOB: 03/13/1941, 82 y.o.   MRN: [id number] Chief Complaint  Patient presents with   Follow-up    Ultrasound follow up    [name] [name] is an 82-year-old male returns to the office for followup and review of the noninvasive studies.   The patient notes that there has been a significant deterioration in the lower extremity symptoms.  The patient notes interval shortening of their claudication distance and development of mild rest pain symptoms. No new ulcers or wounds have occurred since the last visit.  Evaluation of his foot he has gangrenous changes to his left second toe.  He notes that this has been present for 1 to 2 weeks.  The patient is a fairly poor historian.  He also has 2 small wounds near his ankle area.  There have been no significant changes to the patient's overall health care.  The patient denies amaurosis fugax or recent TIA symptoms. There are no recent neurological changes noted. There is no history of DVT, PE or superficial thrombophlebitis. The patient denies recent episodes of angina or shortness of breath.   ABI's Rt=AKA and Lt=0.33 (previous ABI's Rt=AKA and Lt=1.11) the patient's left TBI is 0 Duplex US of the left lower extremity arterial system shows monophasic tibial artery waveforms with flat toe waveforms.    Review of Systems  Skin:  Positive for wound.  Neurological:  Positive for weakness.  All other systems reviewed and are negative.      Objective:   Physical Exam Vitals reviewed.  HENT:     Head: Normocephalic.  Cardiovascular:     Rate and Rhythm: Normal rate.     Pulses:          Dorsalis pedis pulses are 0 on the left side.       Posterior tibial pulses are 0 on the left side.  Pulmonary:     Effort: Pulmonary effort is normal.  Musculoskeletal:       Feet:     Right Lower Extremity: Right leg is amputated above knee.  Skin:    General: Skin is dry.  Neurological:     Mental  Status: He is alert and oriented to person, place, and time.     Motor: Weakness present.     Gait: Gait abnormal.  Psychiatric:        Mood and Affect: Mood normal.        Behavior: Behavior normal.        Thought Content: Thought content normal.        Judgment: Judgment normal.         BP 101/67 (BP Location: Left Arm)   Pulse 66   Resp 15   Past Medical History:  Diagnosis Date   CHF (congestive heart failure) (HCC)    Diabetes (HCC)    Dysrhythmia    AFIB   HTN (hypertension)    Infection    LEG   Pancreatitis     Social History   Socioeconomic History   Marital status: Married    Spouse name: Not on file   Number of children: Not on file   Years of education: Not on file   Highest education level: Not on file  Occupational History   Not on file  Tobacco Use   Smoking status: Never   Smokeless tobacco: Current    Types: Chew  Substance and Sexual Activity   Alcohol use: No      Alcohol/week: 0.0 standard drinks of alcohol   Drug use: No   Sexual activity: Not on file  Other Topics Concern   Not on file  Social History Narrative   Not on file   Social Determinants of Health   Financial Resource Strain: Not on file  Food Insecurity: Not on file  Transportation Needs: Not on file  Physical Activity: Not on file  Stress: Not on file  Social Connections: Not on file  Intimate Partner Violence: Not on file    Past Surgical History:  Procedure Laterality Date   AMPUTATION Right 03/09/2021   Procedure: AMPUTATION FOOT;  Surgeon: Dew, Jason S, MD;  Location: [facility];  Service: General;  Laterality: Right;   AMPUTATION Right 03/11/2021   Procedure: AMPUTATION ABOVE KNEE;  Surgeon: Dew, Jason S, MD;  Location: [facility];  Service: General;  Laterality: Right;   CATARACT EXTRACTION W/PHACO Right 03/08/2017   Procedure: CATARACT EXTRACTION PHACO AND INTRAOCULAR LENS PLACEMENT ([facility]);  Surgeon: King, Bradley Mark, MD;  Location: [facility];  Service:  Ophthalmology;  Laterality: Right;  Lot# [id number] US: 00:30.9 AP%: 8.2 CDE: 2.54   CATARACT EXTRACTION W/PHACO Left 04/04/2017   Procedure: CATARACT EXTRACTION PHACO AND INTRAOCULAR LENS PLACEMENT ([facility]);  Surgeon: King, Bradley Mark, MD;  Location: [facility];  Service: Ophthalmology;  Laterality: Left;  US 00:31.4 AP% 12.0 CDE 3.78 Fluid Pack lot # [id number]   CHOLECYSTECTOMY     EYE SURGERY     right eye   LOWER EXTREMITY ANGIOGRAPHY Right 02/10/2021   Procedure: LOWER EXTREMITY ANGIOGRAPHY;  Surgeon: Dew, Jason S, MD;  Location: [facility];  Service: Cardiovascular;  Laterality: Right;    History reviewed. No pertinent family history.  No Known Allergies     Latest Ref Rng & Units 03/16/2021    4:16 AM 03/15/2021    7:23 AM 03/14/2021    3:30 AM  CBC  WBC 4.0 - 10.5 K/uL  10.0  12.9   Hemoglobin 13.0 - 17.0 g/dL 8.3  8.2  7.9   Hematocrit 39.0 - 52.0 %  24.3  23.1   Platelets 150 - 400 K/uL  240  236       CMP     Component Value Date/Time   NA 136 03/11/2021 0446   NA 143 02/24/2012 0325   K 4.3 03/11/2021 0446   K 4.1 02/24/2012 0325   CL 106 03/11/2021 0446   CL 108 (H) 02/24/2012 0325   CO2 25 03/11/2021 0446   CO2 26 02/24/2012 0325   GLUCOSE 107 (H) 03/11/2021 0446   GLUCOSE 120 (H) 02/24/2012 0325   BUN 33 (H) 03/11/2021 0446   BUN 25 (H) 02/24/2012 0325   CREATININE 0.98 03/14/2021 0330   CREATININE 1.47 (H) 02/24/2012 0325   CALCIUM 7.9 (L) 03/11/2021 0446   CALCIUM 9.1 02/24/2012 0325   PROT 6.8 03/09/2021 0611   PROT 7.1 02/22/2012 1451   ALBUMIN 2.1 (L) 03/09/2021 0611   ALBUMIN 3.6 02/22/2012 1451   AST 32 03/09/2021 0611   AST 52 (H) 02/22/2012 1451   ALT 15 03/09/2021 0611   ALT 88 (H) 02/22/2012 1451   ALKPHOS 113 03/09/2021 0611   ALKPHOS 61 02/22/2012 1451   BILITOT 0.9 03/09/2021 0611   BILITOT 1.4 (H) 02/22/2012 1451   GFRNONAA >60 03/14/2021 0330   GFRNONAA 48 (L) 02/24/2012 0325   GFRAA 55 (L) 02/24/2012 0325      No results found.     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  This represents a limb threatening ischemia and places the patient at the risk for left limb loss.  Patient should undergo angiography of the left lower extremity with the hope for intervention for limb salvage.  The risks and benefits as well as the alternative therapies was discussed in detail with the patient.  All questions were answered.  Patient agrees to proceed with left angiography.  The patient will follow up with me in the office after the procedure.  - VAS US ABI WITH/WO TBI  2. Hypertension, unspecified type Continue antihypertensive medications as already ordered, these medications have been reviewed and there are no changes at this time.  3. Type 2 diabetes mellitus without complication, unspecified whether long term insulin use (HCC) Continue hypoglycemic medications as already ordered, these medications have been reviewed and there are no changes at this time.  Hgb A1C to be monitored as already arranged by primary service  4. Hyperlipidemia, unspecified hyperlipidemia type Continue statin as ordered and reviewed, no changes at this time   Current Outpatient Medications on File Prior to Visit  Medication Sig Dispense Refill   ascorbic acid (VITAMIN C) 500 MG tablet Take 500 mg by mouth daily.     atorvastatin (LIPITOR) 10 MG tablet Take 1 tablet (10 mg total) by mouth daily. 30 tablet 11   carvedilol (COREG) 6.25 MG tablet Take 6.25 mg 2 (two) times daily by mouth.     digoxin (LANOXIN) 0.125 MG tablet Take 0.125 mcg daily by mouth.     ferrous gluconate (FERGON) 324 MG tablet Take 1 tablet (324 mg total) by mouth 2 (two) times daily with a meal. 60 tablet 0   ferrous sulfate 325  (65 FE) MG tablet Take by mouth.     furosemide (LASIX) 20 MG tablet Take 20 mg daily by mouth.     furosemide (LASIX) 20 MG tablet Take 1 tablet by mouth daily.     gabapentin (NEURONTIN) 100 MG capsule Take 1 capsule (100 mg total) by mouth 3 (three) times daily. 90 capsule 0   lisinopril (PRINIVIL,ZESTRIL) 10 MG tablet Take 15 mg by mouth daily.     metFORMIN (GLUCOPHAGE) 1000 MG tablet Take 1,000 mg by mouth 2 (two) times daily with a meal.     rivaroxaban (XARELTO) 20 MG TABS tablet Take 20 mg daily by mouth.     SSD 1 % cream Apply topically.     vitamin B-12 (CYANOCOBALAMIN) 1000 MCG tablet Take 1,000 mcg by mouth daily.     No current facility-administered medications on file prior to visit.    There are no Patient Instructions on file for this visit. No follow-ups on file.   Isai Gottlieb E Emer Onnen, NP   

## 2022-06-14 LAB — VAS US ABI WITH/WO TBI: Left ABI: 0.33

## 2022-06-22 ENCOUNTER — Telehealth (INDEPENDENT_AMBULATORY_CARE_PROVIDER_SITE_OTHER): Payer: Self-pay

## 2022-06-22 NOTE — Telephone Encounter (Signed)
Spoke with the patient and he is scheduled with Dr. Lucky Cowboy on 07/03/22 with a 6:45 am arrival time to the [facility] for a LLE angio. Pre-procedure instructions were discussed and will be mailed.

## 2022-07-03 ENCOUNTER — Other Ambulatory Visit: Payer: Self-pay

## 2022-07-03 ENCOUNTER — Ambulatory Visit
Admission: RE | Admit: 2022-07-03 | Discharge: 2022-07-03 | Disposition: A | Discharge status: Home / Self Care | Payer: Medicare HMO | Source: Ambulatory Visit | Attending: Vascular Surgery | Admitting: Vascular Surgery

## 2022-07-03 ENCOUNTER — Encounter: Payer: Self-pay | Admitting: Vascular Surgery

## 2022-07-03 ENCOUNTER — Encounter
Admission: RE | Disposition: A | Discharge status: Home / Self Care | Payer: Self-pay | Source: Ambulatory Visit | Attending: Vascular Surgery

## 2022-07-03 DIAGNOSIS — E11621 Type 2 diabetes mellitus with foot ulcer: Secondary | ICD-10-CM | POA: Insufficient documentation

## 2022-07-03 DIAGNOSIS — L97529 Non-pressure chronic ulcer of other part of left foot with unspecified severity: Secondary | ICD-10-CM | POA: Insufficient documentation

## 2022-07-03 DIAGNOSIS — I10 Essential (primary) hypertension: Secondary | ICD-10-CM | POA: Insufficient documentation

## 2022-07-03 DIAGNOSIS — I70269 Atherosclerosis of native arteries of extremities with gangrene, unspecified extremity: Secondary | ICD-10-CM

## 2022-07-03 DIAGNOSIS — E785 Hyperlipidemia, unspecified: Secondary | ICD-10-CM | POA: Insufficient documentation

## 2022-07-03 DIAGNOSIS — I70262 Atherosclerosis of native arteries of extremities with gangrene, left leg: Secondary | ICD-10-CM | POA: Diagnosis not present

## 2022-07-03 DIAGNOSIS — E1152 Type 2 diabetes mellitus with diabetic peripheral angiopathy with gangrene: Secondary | ICD-10-CM | POA: Diagnosis not present

## 2022-07-03 DIAGNOSIS — Z7984 Long term (current) use of oral hypoglycemic drugs: Secondary | ICD-10-CM | POA: Diagnosis not present

## 2022-07-03 DIAGNOSIS — F17290 Nicotine dependence, other tobacco product, uncomplicated: Secondary | ICD-10-CM | POA: Insufficient documentation

## 2022-07-03 HISTORY — PX: LOWER EXTREMITY ANGIOGRAPHY: CATH118251

## 2022-07-03 LAB — GLUCOSE, CAPILLARY
Glucose-Capillary: 80 mg/dL (ref 70–99)
Glucose-Capillary: 80 mg/dL (ref 70–99)
Glucose-Capillary: 93 mg/dL (ref 70–99)

## 2022-07-03 LAB — CREATININE, SERUM
Creatinine, Ser: 1.17 mg/dL (ref 0.61–1.24)
GFR, Estimated: >60 mL/min (ref >60)

## 2022-07-03 LAB — BUN: BUN: 10 mg/dL (ref 8–23)

## 2022-07-03 SURGERY — LOWER EXTREMITY ANGIOGRAPHY
Anesthesia: Moderate Sedation | Site: Leg Lower | Laterality: Left

## 2022-07-03 MED ORDER — MIDAZOLAM HCL 2 MG/ML PO SYRP: 8.0000 mg | ORAL_SOLUTION | Freq: Once | ORAL | Status: DC | PRN

## 2022-07-03 MED ORDER — FENTANYL CITRATE (PF) 100 MCG/2ML IJ SOLN
INTRAMUSCULAR | Status: AC
  Filled 2022-07-03: qty 2

## 2022-07-03 MED ORDER — METHYLPREDNISOLONE SODIUM SUCC 125 MG IJ SOLR: 125.0000 mg | Freq: Once | INTRAMUSCULAR | Status: DC | PRN

## 2022-07-03 MED ORDER — DEXTROSE 50 % IV SOLN
25.0000 mL | Freq: Once | INTRAVENOUS | Status: AC
  Administered 2022-07-03: 25 mL via INTRAVENOUS

## 2022-07-03 MED ORDER — SODIUM CHLORIDE 0.9 % IV SOLN: 250.0000 mL | INTRAVENOUS | Status: DC | PRN

## 2022-07-03 MED ORDER — SODIUM CHLORIDE 0.9 % IV SOLN: INTRAVENOUS | Status: DC

## 2022-07-03 MED ORDER — ACETAMINOPHEN 325 MG PO TABS: 650.0000 mg | ORAL_TABLET | ORAL | Status: DC | PRN

## 2022-07-03 MED ORDER — ASPIRIN 81 MG PO TBEC: 81.0000 mg | DELAYED_RELEASE_TABLET | Freq: Every day | ORAL | Status: DC

## 2022-07-03 MED ORDER — HYDROMORPHONE HCL 1 MG/ML IJ SOLN: 1.0000 mg | Freq: Once | INTRAMUSCULAR | Status: DC | PRN

## 2022-07-03 MED ORDER — MIDAZOLAM HCL 2 MG/2ML IJ SOLN
INTRAMUSCULAR | Status: AC
  Filled 2022-07-03: qty 2

## 2022-07-03 MED ORDER — HEPARIN SODIUM (PORCINE) 1000 UNIT/ML IJ SOLN
INTRAMUSCULAR | Status: DC | PRN
  Administered 2022-07-03: 5000 [IU] via INTRAVENOUS

## 2022-07-03 MED ORDER — ONDANSETRON HCL 4 MG/2ML IJ SOLN: 4.0000 mg | Freq: Four times a day (QID) | INTRAMUSCULAR | Status: DC | PRN

## 2022-07-03 MED ORDER — ASPIRIN 81 MG PO TBEC: 81.0000 mg | DELAYED_RELEASE_TABLET | Freq: Every day | ORAL | 2 refills | Status: DC

## 2022-07-03 MED ORDER — DIPHENHYDRAMINE HCL 50 MG/ML IJ SOLN: 50.0000 mg | Freq: Once | INTRAMUSCULAR | Status: DC | PRN

## 2022-07-03 MED ORDER — HYDRALAZINE HCL 20 MG/ML IJ SOLN: 5.0000 mg | INTRAMUSCULAR | Status: DC | PRN

## 2022-07-03 MED ORDER — DEXTROSE 50 % IV SOLN: 25.0000 mL | Freq: Once | INTRAVENOUS | Status: AC

## 2022-07-03 MED ORDER — LABETALOL HCL 5 MG/ML IV SOLN: 10.0000 mg | INTRAVENOUS | Status: DC | PRN

## 2022-07-03 MED ORDER — MIDAZOLAM HCL 2 MG/2ML IJ SOLN
INTRAMUSCULAR | Status: DC | PRN
  Administered 2022-07-03: 1 mg via INTRAVENOUS

## 2022-07-03 MED ORDER — SODIUM CHLORIDE 0.9% FLUSH: 3.0000 mL | Freq: Two times a day (BID) | INTRAVENOUS | Status: DC

## 2022-07-03 MED ORDER — FENTANYL CITRATE (PF) 100 MCG/2ML IJ SOLN
INTRAMUSCULAR | Status: DC | PRN
  Administered 2022-07-03: 50 ug via INTRAVENOUS

## 2022-07-03 MED ORDER — CEFAZOLIN SODIUM-DEXTROSE 2-4 GM/100ML-% IV SOLN
INTRAVENOUS | Status: AC
  Administered 2022-07-03: 2 g via INTRAVENOUS
  Filled 2022-07-03: qty 100

## 2022-07-03 MED ORDER — CEFAZOLIN SODIUM-DEXTROSE 2-4 GM/100ML-% IV SOLN: 2.0000 g | INTRAVENOUS | Status: AC

## 2022-07-03 MED ORDER — SODIUM CHLORIDE 0.9% FLUSH: 3.0000 mL | INTRAVENOUS | Status: DC | PRN

## 2022-07-03 MED ORDER — HEPARIN SODIUM (PORCINE) 1000 UNIT/ML IJ SOLN
INTRAMUSCULAR | Status: AC
  Filled 2022-07-03: qty 10

## 2022-07-03 MED ORDER — FAMOTIDINE 20 MG PO TABS: 40.0000 mg | ORAL_TABLET | Freq: Once | ORAL | Status: DC | PRN

## 2022-07-03 MED ORDER — DEXTROSE 50 % IV SOLN
INTRAVENOUS | Status: AC
  Administered 2022-07-03: 25 mL via INTRAVENOUS
  Filled 2022-07-03: qty 50

## 2022-07-03 MED ORDER — ASPIRIN 81 MG PO TBEC
DELAYED_RELEASE_TABLET | ORAL | Status: AC
  Administered 2022-07-03: 81 mg via ORAL
  Filled 2022-07-03: qty 1

## 2022-07-03 SURGICAL SUPPLY — 19 items
BALLN LUTONIX DCB 6X100X130 (BALLOONS) ×1
BALLN ULTRVRSE 018 2.5X150X150 (BALLOONS) ×1
BALLOON LUTONIX DCB 6X100X130 (BALLOONS) IMPLANT
BALLOON ULTRVS 018 2.5X150X150 (BALLOONS) IMPLANT
CATH ANGIO 5F PIGTAIL 65CM (CATHETERS) IMPLANT
CATH NAVICROSS ANGLED 135CM (MICROCATHETER) IMPLANT
CATH SEEKER .018X150 (CATHETERS) IMPLANT
CATH VERT 5X100 (CATHETERS) IMPLANT
DEVICE STARCLOSE SE CLOSURE (Vascular Products) IMPLANT
GLIDEWIRE ADV .035X260CM (WIRE) IMPLANT
GUIDEWIRE PFTE-COATED .018X300 (WIRE) IMPLANT
KIT ENCORE 26 ADVANTAGE (KITS) IMPLANT
PACK ANGIOGRAPHY (CUSTOM PROCEDURE TRAY) ×1 IMPLANT
SHEATH BRITE TIP 5FRX11 (SHEATH) IMPLANT
SHEATH PINNACLE ST 6F 65CM (SHEATH) IMPLANT
SYR MEDRAD MARK 7 150ML (SYRINGE) IMPLANT
TUBING CONTRAST HIGH PRESS 72 (TUBING) IMPLANT
WIRE G V18X300CM (WIRE) IMPLANT
WIRE GUIDERIGHT .035X150 (WIRE) IMPLANT

## 2022-07-03 NOTE — Interval H&P Note (Signed)
History and Physical Interval Note:  07/03/2022 8:11 AM  [name] [name]  has presented today for surgery, with the diagnosis of LLE Angio  BARD   ASO w gangrene.  The various methods of treatment have been discussed with the patient and family. After consideration of risks, benefits and other options for treatment, the patient has consented to  Procedure(s): Lower Extremity Angiography (Left) as a surgical intervention.  The patient's history has been reviewed, patient examined, no change in status, stable for surgery.  I have reviewed the patient's chart and labs.  Questions were answered to the patient's satisfaction.     [name]

## 2022-07-03 NOTE — Progress Notes (Signed)
Family at bedside, pt drinking water and OJ

## 2022-07-03 NOTE — Op Note (Signed)
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  Ultrasound guidance for vascular access right femoral artery             2.  Catheter placement into left common femoral artery from right femoral approach             3.  Aortogram and selective left lower extremity angiogram             4.  Percutaneous transluminal angioplasty of left peroneal artery and TP trunk with a 2.5 mm diameter by 15 cm length angioplasty balloon             5.  Percutaneous transluminal angioplasty of the right distal SFA with 6 mm diameter by 10 cm length Lutonix drug-coated angioplasty balloon  6.  StarClose closure device right femoral artery  EBL: 10 cc  Contrast: 65 cc  Fluoro Time: 15.4 minutes  Moderate Conscious Sedation Time: approximately 49 minutes using 1 mg of Versed and 50 mcg of Fentanyl              Indications:  Patient is a 82 y.o.male with gangrene of the left foot.  The patient is brought in for angiography for further evaluation and potential treatment.  Due to the limb threatening nature of the situation, angiogram was performed for attempted limb salvage. The patient is aware that if the procedure fails, amputation would be expected.  The patient also understands that even with successful revascularization, amputation may still be required due to the severity of the situation.  Risks and benefits are discussed and informed consent is obtained.   Procedure:  The patient was identified and appropriate procedural time out was performed.  The patient was then placed supine on the table and prepped and draped in the usual sterile fashion. Moderate conscious sedation was administered during a face to face encounter with the patient throughout the  procedure with my supervision of the RN administering medicines and monitoring the patient's vital signs, pulse oximetry, telemetry and mental status throughout from the start of the procedure until the patient was taken to the recovery room. Ultrasound was used to evaluate the right common femoral artery.  It was patent .  A digital ultrasound image was acquired.  A Seldinger needle was used to access the right common femoral artery under direct ultrasound guidance and a permanent image was performed.  A 0.035 J wire was advanced without resistance and a 5Fr sheath was placed.  Pigtail catheter was placed into the aorta and an AP aortogram was performed. This demonstrated normal renal arteries and normal aorta and iliac segments without significant stenosis. I then crossed the aortic bifurcation and advanced to the left femoral head. Selective left lower extremity angiogram was then performed. This demonstrated fairly normal common femoral artery, profunda femoris artery, and proximal and mid superficial femoral artery.  In the distal superficial femoral artery at Hunter's canal was a moderate stenosis in the 50 to 60% range over about 5 to 6 cm.  The popliteal artery then normalized.  There was then severe tibial disease.  The peroneal artery had multiple segments of occlusion and occluded distally without further reconstitution but there were a large number of collaterals that came off the proximal  and mid segment of the peroneal artery with occlusion above this.  The posterior tibial artery had a near occlusive stenosis proximally and then occluded in the midsegment with reconstitution of the distal posterior tibial artery as the only runoff to the foot.  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 It was felt that it was in the patient's best interest to proceed with intervention after these images to avoid a second procedure and a larger amount of contrast and fluoroscopy  based off of the findings from the initial angiogram. The patient was systemically heparinized and a 6 French 65 cm sheath was then placed over the Terumo Advantage wire. I then used a Kumpe catheter and the advantage wire to cross the moderate stenosis at Hunter's canal and then get down into the tibial vessels.  I exchanged for a Ferd Hibbs cross catheter and a 0.018 vantage wire and initially got into the posterior tibial artery and cross the proximal stenosis but could never get across the mid to distal occlusion and get into the intraluminal posterior tibial artery distally.  Multiple false channels were seen and it was clear this was going to be unsuccessful today.  I went back and got across the proximal to mid peroneal artery where there were large collaterals that could help provide flow distally and then exchanged for a V18 wire that was parked in the mid to distal peroneal artery.  Although it was occluded distally, I elected to balloon the proximal peroneal artery and tibioperoneal trunk to try to improve the collaterals distally.  A 2.5 mm diameter by 15 cm length angioplasty balloon was inflated to 8 atm for 1 minute with completion imaging showing less than 30% residual stenosis in the areas treated with a known occlusion distally.  A 6 mm diameter by 10 cm length Lutonix drug-coated angioplasty balloon was inflated for the Hunter's canal lesion and taken up to 12 atm for 1 minute.  Completion imaging showed only about a 25 to 30% residual stenosis in the large vessel at this location.  We will plan to come back from a pedal approach to try to treat the posterior tibial artery occlusion at a later date to improve his flow to the foot and an attempt at limb salvage. I elected to terminate the procedure. The sheath was removed and StarClose closure device was deployed in the right femoral artery with excellent hemostatic result. The patient was taken to the recovery room in stable condition having tolerated  the procedure well.  Findings:               Aortogram:  This demonstrated normal renal arteries and normal aorta and iliac segments without significant stenosis.             Left Lower Extremity:  This demonstrated fairly normal common femoral artery, profunda femoris artery, and proximal and mid superficial femoral artery.  In the distal superficial femoral artery at Hunter's canal was a moderate stenosis in the 50 to 60% range over about 5 to 6 cm.  The popliteal artery then normalized.  There was then severe tibial disease.  The peroneal artery had multiple segments of occlusion and occluded distally without further reconstitution but there were a large number of collaterals that came off the proximal and mid segment of the peroneal artery with occlusion above this.  The posterior tibial artery had a near occlusive stenosis proximally and then occluded in the midsegment with reconstitution of the distal posterior tibial artery  as the only runoff to the foot.  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  Complications: None  [name] 07/03/2022 9:35 AM   This note was created with Dragon Medical transcription system. Any errors in dictation are purely unintentional.

## 2022-07-04 ENCOUNTER — Encounter: Payer: Self-pay | Admitting: Vascular Surgery

## 2022-07-11 ENCOUNTER — Ambulatory Visit (INDEPENDENT_AMBULATORY_CARE_PROVIDER_SITE_OTHER): Payer: Medicare HMO | Admitting: Nurse Practitioner

## 2022-07-17 ENCOUNTER — Telehealth (INDEPENDENT_AMBULATORY_CARE_PROVIDER_SITE_OTHER): Payer: Self-pay

## 2022-07-17 NOTE — Telephone Encounter (Signed)
Spoke with the patient and his spouse and he is scheduled with Dr. Lucky Cowboy on 07/24/02 with a 8:30 am arrival time to the [facility] for a LLE angio w/ pedal approcach. Pre-procedure instructions were discussed and will be mailed.

## 2022-07-24 ENCOUNTER — Encounter
Admission: RE | Disposition: A | Discharge status: Home / Self Care | Payer: Self-pay | Source: Ambulatory Visit | Attending: Vascular Surgery

## 2022-07-24 ENCOUNTER — Ambulatory Visit
Admission: RE | Admit: 2022-07-24 | Discharge: 2022-07-24 | Disposition: A | Discharge status: Home / Self Care | Payer: Medicare HMO | Source: Ambulatory Visit | Attending: Vascular Surgery | Admitting: Vascular Surgery

## 2022-07-24 ENCOUNTER — Encounter: Payer: Self-pay | Admitting: Vascular Surgery

## 2022-07-24 ENCOUNTER — Other Ambulatory Visit: Payer: Self-pay

## 2022-07-24 DIAGNOSIS — I70262 Atherosclerosis of native arteries of extremities with gangrene, left leg: Secondary | ICD-10-CM

## 2022-07-24 DIAGNOSIS — L97529 Non-pressure chronic ulcer of other part of left foot with unspecified severity: Secondary | ICD-10-CM | POA: Insufficient documentation

## 2022-07-24 DIAGNOSIS — E785 Hyperlipidemia, unspecified: Secondary | ICD-10-CM | POA: Diagnosis not present

## 2022-07-24 DIAGNOSIS — Z79899 Other long term (current) drug therapy: Secondary | ICD-10-CM | POA: Diagnosis not present

## 2022-07-24 DIAGNOSIS — Z9889 Other specified postprocedural states: Secondary | ICD-10-CM | POA: Diagnosis not present

## 2022-07-24 DIAGNOSIS — Z9862 Peripheral vascular angioplasty status: Secondary | ICD-10-CM

## 2022-07-24 DIAGNOSIS — E1152 Type 2 diabetes mellitus with diabetic peripheral angiopathy with gangrene: Secondary | ICD-10-CM | POA: Insufficient documentation

## 2022-07-24 DIAGNOSIS — I70229 Atherosclerosis of native arteries of extremities with rest pain, unspecified extremity: Secondary | ICD-10-CM

## 2022-07-24 DIAGNOSIS — E11621 Type 2 diabetes mellitus with foot ulcer: Secondary | ICD-10-CM | POA: Insufficient documentation

## 2022-07-24 DIAGNOSIS — I10 Essential (primary) hypertension: Secondary | ICD-10-CM | POA: Insufficient documentation

## 2022-07-24 DIAGNOSIS — F17290 Nicotine dependence, other tobacco product, uncomplicated: Secondary | ICD-10-CM | POA: Diagnosis not present

## 2022-07-24 HISTORY — PX: LOWER EXTREMITY ANGIOGRAPHY: CATH118251

## 2022-07-24 LAB — BUN: BUN: 17 mg/dL (ref 8–23)

## 2022-07-24 LAB — GLUCOSE, CAPILLARY
Glucose-Capillary: 81 mg/dL (ref 70–99)
Glucose-Capillary: 128 mg/dL — ABNORMAL HIGH (ref 70–99)

## 2022-07-24 LAB — CREATININE, SERUM
Creatinine, Ser: 1.46 mg/dL — ABNORMAL HIGH (ref 0.61–1.24)
GFR, Estimated: 48 mL/min — ABNORMAL LOW (ref >60)

## 2022-07-24 SURGERY — LOWER EXTREMITY ANGIOGRAPHY
Anesthesia: Moderate Sedation | Site: Leg Lower | Laterality: Left

## 2022-07-24 MED ORDER — HEPARIN SODIUM (PORCINE) 1000 UNIT/ML IJ SOLN
INTRAMUSCULAR | Status: DC | PRN
  Administered 2022-07-24: 4000 [IU] via INTRAVENOUS

## 2022-07-24 MED ORDER — MIDAZOLAM HCL 2 MG/ML PO SYRP: 8.0000 mg | ORAL_SOLUTION | Freq: Once | ORAL | Status: DC | PRN

## 2022-07-24 MED ORDER — CEFAZOLIN SODIUM-DEXTROSE 2-4 GM/100ML-% IV SOLN: 2.0000 g | INTRAVENOUS | Status: AC

## 2022-07-24 MED ORDER — CEFAZOLIN SODIUM-DEXTROSE 2-4 GM/100ML-% IV SOLN
INTRAVENOUS | Status: AC
  Administered 2022-07-24: 2 g via INTRAVENOUS
  Filled 2022-07-24: qty 100

## 2022-07-24 MED ORDER — SODIUM CHLORIDE 0.9 % IV SOLN: 250.0000 mL | INTRAVENOUS | Status: DC | PRN

## 2022-07-24 MED ORDER — LABETALOL HCL 5 MG/ML IV SOLN: 10.0000 mg | INTRAVENOUS | Status: DC | PRN

## 2022-07-24 MED ORDER — DEXTROSE 50 % IV SOLN
INTRAVENOUS | Status: AC
  Administered 2022-07-24: 25 g
  Filled 2022-07-24: qty 50

## 2022-07-24 MED ORDER — MIDAZOLAM HCL 2 MG/2ML IJ SOLN
INTRAMUSCULAR | Status: AC
  Filled 2022-07-24: qty 2

## 2022-07-24 MED ORDER — FAMOTIDINE 20 MG PO TABS: 40.0000 mg | ORAL_TABLET | Freq: Once | ORAL | Status: DC | PRN

## 2022-07-24 MED ORDER — HYDROMORPHONE HCL 1 MG/ML IJ SOLN: 1.0000 mg | Freq: Once | INTRAMUSCULAR | Status: DC | PRN

## 2022-07-24 MED ORDER — SODIUM CHLORIDE 0.9 % IV SOLN: INTRAVENOUS | Status: DC

## 2022-07-24 MED ORDER — SODIUM CHLORIDE 0.9% FLUSH: 3.0000 mL | INTRAVENOUS | Status: DC | PRN

## 2022-07-24 MED ORDER — MIDAZOLAM HCL 2 MG/2ML IJ SOLN
INTRAMUSCULAR | Status: DC | PRN
  Administered 2022-07-24: 1 mg via INTRAVENOUS

## 2022-07-24 MED ORDER — SODIUM CHLORIDE 0.9% FLUSH: 3.0000 mL | Freq: Two times a day (BID) | INTRAVENOUS | Status: DC

## 2022-07-24 MED ORDER — ACETAMINOPHEN 325 MG PO TABS: 650.0000 mg | ORAL_TABLET | ORAL | Status: DC | PRN

## 2022-07-24 MED ORDER — FENTANYL CITRATE (PF) 100 MCG/2ML IJ SOLN
INTRAMUSCULAR | Status: AC
  Filled 2022-07-24: qty 2

## 2022-07-24 MED ORDER — FENTANYL CITRATE (PF) 100 MCG/2ML IJ SOLN
INTRAMUSCULAR | Status: DC | PRN
  Administered 2022-07-24: 50 ug via INTRAVENOUS

## 2022-07-24 MED ORDER — HYDRALAZINE HCL 20 MG/ML IJ SOLN: 5.0000 mg | INTRAMUSCULAR | Status: DC | PRN

## 2022-07-24 MED ORDER — DIPHENHYDRAMINE HCL 50 MG/ML IJ SOLN: 50.0000 mg | Freq: Once | INTRAMUSCULAR | Status: DC | PRN

## 2022-07-24 MED ORDER — HEPARIN SODIUM (PORCINE) 1000 UNIT/ML IJ SOLN
INTRAMUSCULAR | Status: AC
  Filled 2022-07-24: qty 10

## 2022-07-24 MED ORDER — METHYLPREDNISOLONE SODIUM SUCC 125 MG IJ SOLR: 125.0000 mg | Freq: Once | INTRAMUSCULAR | Status: DC | PRN

## 2022-07-24 MED ORDER — ONDANSETRON HCL 4 MG/2ML IJ SOLN: 4.0000 mg | Freq: Four times a day (QID) | INTRAMUSCULAR | Status: DC | PRN

## 2022-07-24 SURGICAL SUPPLY — 17 items
BALLN LUTONIX 018 4X80X130 (BALLOONS) ×1
BALLN ULTRV 018 3X100X75 (BALLOONS) ×1
BALLN ULTRVRSE 2.5X300X150 (BALLOONS) ×1
BALLOON LUTONIX 018 4X80X130 (BALLOONS) IMPLANT
BALLOON ULTRV 018 3X100X75 (BALLOONS) IMPLANT
BALLOON ULTRVRSE 2.5X300X150 (BALLOONS) IMPLANT
CANNULA 5F STIFF (CANNULA) IMPLANT
DEVICE RAD COMP TR BAND LRG (VASCULAR PRODUCTS) IMPLANT
DRAPE FEMORAL ANGIO W/ POUCH (DRAPES) IMPLANT
DRAPE INCISE 23X17 STRL (DRAPES) IMPLANT
DRAPE INCISE IOBAN 23X17 STRL (DRAPES) ×1 IMPLANT
GLIDEWIRE ADV .035X180CM (WIRE) IMPLANT
KIT ENCORE 26 ADVANTAGE (KITS) IMPLANT
PACK ANGIOGRAPHY (CUSTOM PROCEDURE TRAY) ×1 IMPLANT
SHEATH HALO 035 5FRX10 (SHEATH) IMPLANT
SHEATH MICROPUNCTURE PEDAL 4FR (SHEATH) IMPLANT
WIRE G 018X200 V18 (WIRE) IMPLANT

## 2022-07-24 NOTE — H&P (Signed)
[facility] Admission History & Physical  MRN : [id number]  [name] [name] is a 82 y.o. (Nov 13, 1940) male who presents with chief complaint of No chief complaint on file. Marland Kitchen  History of Present Illness: Patient presents for angiography of the left lower extremity.  Has had previous partial intervention or attempting a pedal approach to improve his foot flow.  Has gangrenous changes with extremely high risk of limb loss.  No current facility-administered medications for this encounter.    Past Medical History:  Diagnosis Date   CHF (congestive heart failure) ([place])    Diabetes ([place])    Dysrhythmia    AFIB   HTN (hypertension)    Infection    LEG   Pancreatitis     Past Surgical History:  Procedure Laterality Date   AMPUTATION Right 03/09/2021   Procedure: AMPUTATION FOOT;  Surgeon: Algernon Huxley, MD;  Location: [facility];  Service: General;  Laterality: Right;   AMPUTATION Right 03/11/2021   Procedure: AMPUTATION ABOVE KNEE;  Surgeon: Algernon Huxley, MD;  Location: [facility];  Service: General;  Laterality: Right;   CATARACT EXTRACTION W/PHACO Right 03/08/2017   Procedure: CATARACT EXTRACTION PHACO AND INTRAOCULAR LENS PLACEMENT ([place]);  Surgeon: Eulogio Bear, MD;  Location: [facility];  Service: Ophthalmology;  Laterality: Right;  Lot# [id number] H [place]: 00:30.9 AP%: 8.2 CDE: 2.54   CATARACT EXTRACTION W/PHACO Left 04/04/2017   Procedure: CATARACT EXTRACTION PHACO AND INTRAOCULAR LENS PLACEMENT ([facility]);  Surgeon: Eulogio Bear, MD;  Location: [facility];  Service: Ophthalmology;  Laterality: Left;  [place] 00:31.4 AP% 12.0 CDE 3.78 Fluid Pack lot # [id number]   CHOLECYSTECTOMY     EYE SURGERY     right eye   LOWER EXTREMITY ANGIOGRAPHY Right 02/10/2021   Procedure: LOWER EXTREMITY ANGIOGRAPHY;  Surgeon: Algernon Huxley, MD;  Location: [facility];  Service: Cardiovascular;  Laterality: Right;   LOWER EXTREMITY ANGIOGRAPHY Left 07/03/2022   Procedure: Lower  Extremity Angiography;  Surgeon: Algernon Huxley, MD;  Location: [facility];  Service: Cardiovascular;  Laterality: Left;     Social History   Tobacco Use   Smoking status: Never   Smokeless tobacco: Current    Types: Chew  Substance Use Topics   Alcohol use: No    Alcohol/week: 0.0 standard drinks of alcohol   Drug use: No     Family History No bleeding disorders, clotting disorders, autoimmune diseases, or aneurysms  No Known Allergies   REVIEW OF SYSTEMS (Negative unless checked)  Constitutional: [] Weight loss  [] Fever  [] Chills Cardiac: [] Chest pain   [] Chest pressure   [] Palpitations   [] Shortness of breath when laying flat   [] Shortness of breath at rest   [] Shortness of breath with exertion. Vascular:  [] Pain in legs with walking   [] Pain in legs at rest   [] Pain in legs when laying flat   [] Claudication   [] Pain in feet when walking  [] Pain in feet at rest  [] Pain in feet when laying flat   [] History of DVT   [] Phlebitis   [] Swelling in legs   [] Varicose veins   [x] Non-healing ulcers Pulmonary:   [] Uses home oxygen   [] Productive cough   [] Hemoptysis   [] Wheeze  [] COPD   [] Asthma Neurologic:  [] Dizziness  [] Blackouts   [] Seizures   [] History of stroke   [] History of TIA  [] Aphasia   [] Temporary blindness   [] Dysphagia   [] Weakness or numbness in arms   [] Weakness or numbness in  legs Musculoskeletal:  [] Arthritis   [] Joint swelling   [] Joint pain   [] Low back pain Hematologic:  [] Easy bruising  [] Easy bleeding   [] Hypercoagulable state   [x] Anemic  [] Hepatitis Gastrointestinal:  [] Blood in stool   [] Vomiting blood  [x] Gastroesophageal reflux/heartburn   [] Difficulty swallowing. Genitourinary:  [x] Chronic kidney disease   [] Difficult urination  [] Frequent urination  [] Burning with urination   [] Blood in urine Skin:  [] Rashes   [x] Ulcers   [x] Wounds Psychological:  [] History of anxiety   []  History of major depression.  Physical Examination  There were no vitals  filed for this visit. There is no height or weight on file to calculate BMI. Gen: WD/WN, NAD Head: [place]/AT, No temporalis wasting.  Ear/Nose/Throat: Hearing grossly intact, nares w/o erythema or drainage, oropharynx w/o Erythema/Exudate,  Eyes: Conjunctiva clear, sclera non-icteric Neck: Trachea midline.  No JVD.  Pulmonary:  Good air movement, respirations not labored, no use of accessory muscles.  Cardiac: RRR, normal S1, S2. Vascular:  Vessel Right Left  Radial Palpable Palpable                          PT 1+ palpable Not palpable  DP Not palpable Not palpable   Gastrointestinal: soft, non-tender/non-distended. No guarding/reflex.  Musculoskeletal: M/S 5/5 throughout.  Gangrenous changes present to the left foot Neurologic: Sensation grossly intact in extremities.  Symmetrical.  Speech is fluent. Motor exam as listed above. Psychiatric: Judgment intact, Mood & affect appropriate for pt's clinical situation. Dermatologic: Gangrenous changes present to the left foot     CBC Lab Results  Component Value Date   WBC 10.0 03/15/2021   HGB 8.3 (L) 03/16/2021   HCT 24.3 (L) 03/15/2021   MCV 80.5 03/15/2021   PLT 240 03/15/2021    BMET    Component Value Date/Time   NA 136 03/11/2021 0446   NA 143 02/24/2012 0325   K 4.3 03/11/2021 0446   K 4.1 02/24/2012 0325   CL 106 03/11/2021 0446   CL 108 (H) 02/24/2012 0325   CO2 25 03/11/2021 0446   CO2 26 02/24/2012 0325   GLUCOSE 107 (H) 03/11/2021 0446   GLUCOSE 120 (H) 02/24/2012 0325   BUN 10 07/03/2022 0734   BUN 25 (H) 02/24/2012 0325   CREATININE 1.17 07/03/2022 0734   CREATININE 1.47 (H) 02/24/2012 0325   CALCIUM 7.9 (L) 03/11/2021 0446   CALCIUM 9.1 02/24/2012 0325   GFRNONAA >60 07/03/2022 0734   GFRNONAA 48 (L) 02/24/2012 0325   GFRAA 55 (L) 02/24/2012 0325   CrCl cannot be calculated (Patient's most recent lab result is older than the maximum 21 days allowed.).  COAG Lab Results  Component Value Date    INR 1.3 (H) 03/11/2021   INR 1.4 (H) 03/09/2021   INR 1.4 (H) 03/08/2021    Radiology PERIPHERAL VASCULAR CATHETERIZATION  Result Date: 07/03/2022 See surgical note for result.    Assessment/Plan 1.  PAD with gangrene of the left lower extremity.  For repeat angiogram with a pedal approach today.  Risks and benefits discussed.  2. Hypertension, unspecified type Continue antihypertensive medications as already ordered, these medications have been reviewed and there are no changes at this time.   3. Type 2 diabetes mellitus without complication, unspecified whether long term insulin use ([place]) Continue hypoglycemic medications as already ordered, these medications have been reviewed and there are no changes at this time.   Hgb A1C to be monitored as already arranged by primary  service   4. Hyperlipidemia, unspecified hyperlipidemia type Continue statin as ordered and reviewed, no changes at this time   [name], MD  07/24/2022 8:11 AM

## 2022-07-24 NOTE — Op Note (Signed)
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  Ultrasound guidance for vascular access left posterior tibial artery             2.  Catheter placement into left popliteal artery from left posterior tibial approach             3.  Selective left lower extremity angiogram             4.  Percutaneous transluminal angioplasty of left posterior tibial artery with 2.5 and 3 mm diameter angioplasty balloons in both the proximal and the mid segment and then with a 4 mm diameter Lutonix drug-coated angioplasty balloon in the proximal segment             5.  TR band placement left posterior tibial artery access site  EBL: 3 cc  Contrast: 20 cc  Fluoro Time: 2.5 minutes  Moderate Conscious Sedation Time: approximately 22 minutes using 1 mg of Versed and 50 mcg of Fentanyl              Indications:  Patient is a 82 y.o.male with gangrenous changes to the left foot. The patient has previously had peroneal and popliteal intervention from a right femoral approach but we are unable to treat the left posterior tibial artery from a right-sided approach and he is brought back for treatment from a pedal access.  The patient is brought in for angiography for further evaluation and potential treatment.  Due to the limb threatening nature of the situation, angiogram was performed for attempted limb salvage. The patient is aware that if the procedure fails, amputation would be expected.  The patient also understands that even with successful revascularization, amputation may still be required due to the severity of the situation.  Risks and benefits are discussed and informed consent is obtained.   Procedure:  The patient was identified and appropriate procedural  time out was performed.  The patient was then placed supine on the table and prepped and draped in the usual sterile fashion. Moderate conscious sedation was administered during a face to face encounter with the patient throughout the procedure with my supervision of the RN administering medicines and monitoring the patient's vital signs, pulse oximetry, telemetry and mental status throughout from the start of the procedure until the patient was taken to the recovery room. Ultrasound was used to evaluate the left posterior tibial artery.  It was patent .  A digital ultrasound image was acquired.  A micropuncture needle was used to access the left posterior tibial artery artery under direct ultrasound guidance and a permanent image was performed.  A micropuncture wire and sheath were then placed.  A 0.035 Advantage was advanced without resistance and a 5Fr sheath was placed.  Selective right lower extremity angiogram was then performed. This demonstrated the previous popliteal intervention was patent.  The peroneal artery was small and occluded in the mid to distal segment.  The posterior tibial artery had a subtotal occlusion at the origin and another area of very high-grade stenosis in the midsegment but was then continuous to the foot.. It was felt that it was in the patient's best interest to proceed with intervention after these images to avoid a second procedure and a larger  amount of contrast and fluoroscopy based off of the findings from the initial angiogram. The patient was systemically heparinized. I then used a Kumpe catheter and the advantage wire to cross the diseased posterior tibial artery and parked the wire up into the SFA.  After exchanging for a 0.018 wire with a Kumpe catheter and confirming intraluminal flow in the popliteal artery, I proceeded with treatment.  A 2-1/2 and then a 3 mm diameter angioplasty balloon was used to treat the 2 areas of posterior tibial artery stenosis with inflations  up to 10 to 12 atm for 1 minute in both locations.  For the more proximal lesion, a 4 mm diameter by 8 cm length Lutonix drug-coated angioplasty balloon was inflated to 6 atm for 1 minute to treat this area as well.  Completion imaging showed a marked improvement with about a 20% residual stenosis in the proximal segment and a 30% residual stenosis in the midsegment. I elected to terminate the procedure. The sheath was removed and a TR band was placed at the access site with excellent hemostatic result. The patient was taken to the recovery room in stable condition having tolerated the procedure well.  Findings:                         Left Lower Extremity: Previous popliteal intervention was patent.  The peroneal artery was small and occluded in the mid to distal segment.  The posterior tibial artery had a subtotal occlusion at the origin and another area of very high-grade stenosis in the midsegment but was then continuous to the foot.   Disposition: Patient was taken to the recovery room in stable condition having tolerated the procedure well.  Complications: None  [name] 07/24/2022 10:19 AM   This note was created with Dragon Medical transcription system. Any errors in dictation are purely unintentional.

## 2022-07-25 ENCOUNTER — Encounter: Payer: Self-pay | Admitting: Vascular Surgery

## 2022-08-02 DIAGNOSIS — I5032 Chronic diastolic (congestive) heart failure: Secondary | ICD-10-CM | POA: Diagnosis not present

## 2022-08-02 DIAGNOSIS — E119 Type 2 diabetes mellitus without complications: Secondary | ICD-10-CM | POA: Diagnosis not present

## 2022-08-02 DIAGNOSIS — I10 Essential (primary) hypertension: Secondary | ICD-10-CM | POA: Diagnosis not present

## 2022-08-02 DIAGNOSIS — N189 Chronic kidney disease, unspecified: Secondary | ICD-10-CM | POA: Diagnosis not present

## 2022-08-02 DIAGNOSIS — I428 Other cardiomyopathies: Secondary | ICD-10-CM | POA: Diagnosis not present

## 2022-08-02 DIAGNOSIS — Z89611 Acquired absence of right leg above knee: Secondary | ICD-10-CM | POA: Diagnosis not present

## 2022-08-02 DIAGNOSIS — E7800 Pure hypercholesterolemia, unspecified: Secondary | ICD-10-CM | POA: Diagnosis not present

## 2022-08-02 DIAGNOSIS — J45909 Unspecified asthma, uncomplicated: Secondary | ICD-10-CM | POA: Diagnosis not present

## 2022-08-02 DIAGNOSIS — I480 Paroxysmal atrial fibrillation: Secondary | ICD-10-CM | POA: Diagnosis not present

## 2022-08-09 ENCOUNTER — Other Ambulatory Visit: Payer: Self-pay

## 2022-08-09 ENCOUNTER — Emergency Department: Payer: Medicare HMO

## 2022-08-09 ENCOUNTER — Inpatient Hospital Stay
Admission: EM | Admit: 2022-08-09 | Discharge: 2022-08-15 | DRG: 854 | Disposition: A | Discharge status: Skilled Nursing Facility | Payer: Medicare HMO | Attending: Internal Medicine | Admitting: Internal Medicine

## 2022-08-09 DIAGNOSIS — Z794 Long term (current) use of insulin: Secondary | ICD-10-CM | POA: Diagnosis not present

## 2022-08-09 DIAGNOSIS — Z9889 Other specified postprocedural states: Secondary | ICD-10-CM | POA: Diagnosis not present

## 2022-08-09 DIAGNOSIS — E785 Hyperlipidemia, unspecified: Secondary | ICD-10-CM | POA: Diagnosis present

## 2022-08-09 DIAGNOSIS — D6832 Hemorrhagic disorder due to extrinsic circulating anticoagulants: Secondary | ICD-10-CM | POA: Diagnosis not present

## 2022-08-09 DIAGNOSIS — M86172 Other acute osteomyelitis, left ankle and foot: Secondary | ICD-10-CM | POA: Diagnosis present

## 2022-08-09 DIAGNOSIS — E11649 Type 2 diabetes mellitus with hypoglycemia without coma: Secondary | ICD-10-CM | POA: Diagnosis not present

## 2022-08-09 DIAGNOSIS — D638 Anemia in other chronic diseases classified elsewhere: Secondary | ICD-10-CM | POA: Diagnosis present

## 2022-08-09 DIAGNOSIS — E1169 Type 2 diabetes mellitus with other specified complication: Secondary | ICD-10-CM | POA: Diagnosis present

## 2022-08-09 DIAGNOSIS — Z7901 Long term (current) use of anticoagulants: Secondary | ICD-10-CM

## 2022-08-09 DIAGNOSIS — I959 Hypotension, unspecified: Secondary | ICD-10-CM | POA: Diagnosis present

## 2022-08-09 DIAGNOSIS — I110 Hypertensive heart disease with heart failure: Secondary | ICD-10-CM | POA: Diagnosis present

## 2022-08-09 DIAGNOSIS — M86171 Other acute osteomyelitis, right ankle and foot: Secondary | ICD-10-CM | POA: Diagnosis present

## 2022-08-09 DIAGNOSIS — Z79899 Other long term (current) drug therapy: Secondary | ICD-10-CM

## 2022-08-09 DIAGNOSIS — I10 Essential (primary) hypertension: Secondary | ICD-10-CM | POA: Diagnosis present

## 2022-08-09 DIAGNOSIS — Z89611 Acquired absence of right leg above knee: Secondary | ICD-10-CM

## 2022-08-09 DIAGNOSIS — Z961 Presence of intraocular lens: Secondary | ICD-10-CM | POA: Diagnosis not present

## 2022-08-09 DIAGNOSIS — Z1152 Encounter for screening for COVID-19: Secondary | ICD-10-CM | POA: Diagnosis not present

## 2022-08-09 DIAGNOSIS — I491 Atrial premature depolarization: Secondary | ICD-10-CM | POA: Diagnosis not present

## 2022-08-09 DIAGNOSIS — E782 Mixed hyperlipidemia: Secondary | ICD-10-CM | POA: Diagnosis present

## 2022-08-09 DIAGNOSIS — R319 Hematuria, unspecified: Secondary | ICD-10-CM | POA: Diagnosis not present

## 2022-08-09 DIAGNOSIS — E871 Hypo-osmolality and hyponatremia: Secondary | ICD-10-CM | POA: Diagnosis present

## 2022-08-09 DIAGNOSIS — R5383 Other fatigue: Principal | ICD-10-CM

## 2022-08-09 DIAGNOSIS — R001 Bradycardia, unspecified: Secondary | ICD-10-CM | POA: Diagnosis not present

## 2022-08-09 DIAGNOSIS — I480 Paroxysmal atrial fibrillation: Secondary | ICD-10-CM | POA: Diagnosis present

## 2022-08-09 DIAGNOSIS — R531 Weakness: Secondary | ICD-10-CM | POA: Diagnosis present

## 2022-08-09 DIAGNOSIS — N179 Acute kidney failure, unspecified: Secondary | ICD-10-CM | POA: Diagnosis present

## 2022-08-09 DIAGNOSIS — F17220 Nicotine dependence, chewing tobacco, uncomplicated: Secondary | ICD-10-CM | POA: Diagnosis present

## 2022-08-09 DIAGNOSIS — R64 Cachexia: Secondary | ICD-10-CM | POA: Diagnosis present

## 2022-08-09 DIAGNOSIS — Z9049 Acquired absence of other specified parts of digestive tract: Secondary | ICD-10-CM

## 2022-08-09 DIAGNOSIS — T45515A Adverse effect of anticoagulants, initial encounter: Secondary | ICD-10-CM | POA: Diagnosis not present

## 2022-08-09 DIAGNOSIS — E1142 Type 2 diabetes mellitus with diabetic polyneuropathy: Secondary | ICD-10-CM | POA: Diagnosis present

## 2022-08-09 DIAGNOSIS — R9082 White matter disease, unspecified: Secondary | ICD-10-CM | POA: Diagnosis not present

## 2022-08-09 DIAGNOSIS — Z4781 Encounter for orthopedic aftercare following surgical amputation: Secondary | ICD-10-CM | POA: Diagnosis not present

## 2022-08-09 DIAGNOSIS — A419 Sepsis, unspecified organism: Secondary | ICD-10-CM | POA: Diagnosis present

## 2022-08-09 DIAGNOSIS — K219 Gastro-esophageal reflux disease without esophagitis: Secondary | ICD-10-CM | POA: Diagnosis present

## 2022-08-09 DIAGNOSIS — I4891 Unspecified atrial fibrillation: Secondary | ICD-10-CM | POA: Diagnosis not present

## 2022-08-09 DIAGNOSIS — R6520 Severe sepsis without septic shock: Secondary | ICD-10-CM | POA: Diagnosis present

## 2022-08-09 DIAGNOSIS — Z7401 Bed confinement status: Secondary | ICD-10-CM | POA: Diagnosis not present

## 2022-08-09 DIAGNOSIS — I5032 Chronic diastolic (congestive) heart failure: Secondary | ICD-10-CM | POA: Diagnosis present

## 2022-08-09 DIAGNOSIS — Z7984 Long term (current) use of oral hypoglycemic drugs: Secondary | ICD-10-CM

## 2022-08-09 DIAGNOSIS — E1143 Type 2 diabetes mellitus with diabetic autonomic (poly)neuropathy: Secondary | ICD-10-CM | POA: Diagnosis not present

## 2022-08-09 DIAGNOSIS — Z6826 Body mass index (BMI) 26.0-26.9, adult: Secondary | ICD-10-CM

## 2022-08-09 DIAGNOSIS — I96 Gangrene, not elsewhere classified: Secondary | ICD-10-CM

## 2022-08-09 DIAGNOSIS — E1152 Type 2 diabetes mellitus with diabetic peripheral angiopathy with gangrene: Secondary | ICD-10-CM | POA: Diagnosis present

## 2022-08-09 DIAGNOSIS — Z7982 Long term (current) use of aspirin: Secondary | ICD-10-CM

## 2022-08-09 DIAGNOSIS — L03032 Cellulitis of left toe: Secondary | ICD-10-CM | POA: Diagnosis present

## 2022-08-09 DIAGNOSIS — R609 Edema, unspecified: Secondary | ICD-10-CM | POA: Diagnosis not present

## 2022-08-09 DIAGNOSIS — Z89612 Acquired absence of left leg above knee: Secondary | ICD-10-CM | POA: Diagnosis not present

## 2022-08-09 DIAGNOSIS — I70262 Atherosclerosis of native arteries of extremities with gangrene, left leg: Secondary | ICD-10-CM | POA: Diagnosis not present

## 2022-08-09 DIAGNOSIS — E1165 Type 2 diabetes mellitus with hyperglycemia: Secondary | ICD-10-CM | POA: Diagnosis not present

## 2022-08-09 DIAGNOSIS — E1140 Type 2 diabetes mellitus with diabetic neuropathy, unspecified: Secondary | ICD-10-CM | POA: Diagnosis not present

## 2022-08-09 DIAGNOSIS — R0902 Hypoxemia: Secondary | ICD-10-CM | POA: Diagnosis not present

## 2022-08-09 LAB — LACTIC ACID, PLASMA: Lactic Acid, Venous: 3.5 mmol/L (ref 0.5–1.9)

## 2022-08-09 MED ORDER — SODIUM CHLORIDE 0.9 % IV BOLUS
1000.0000 mL | Freq: Once | INTRAVENOUS | Status: AC
  Administered 2022-08-09: 1000 mL via INTRAVENOUS

## 2022-08-09 NOTE — ED Provider Notes (Signed)
[facility] Provider Note    Event Date/Time   First MD Initiated Contact with Patient 08/09/22 2304     (approximate)   History   Fatigue (/) and Hypotension   HPI  Level V caveat: Limited by patient is a poor historian Majority of history provided by patient's wife  [name] [name] is a 82 y.o. male brought to the ED via EMS from home with a chief complaint of generalized weakness and fatigue.  Patient with a history of CHF, atrial fibrillation on Xarelto, on Digoxin.  Wife states patient has not been eating well for the past 2 days.  Blood pressure was low today.  EMS reports blood pressure 88/54 upon their arrival.  Given 400 cc IV normal saline prior to arrival with improvement in blood pressure to 105 systolic.  Patient denies specific complaints, only states he is tired.  Denies headache, vision changes, neck pain, cough, chest pain, shortness of breath, abdominal pain, nausea, vomiting or diarrhea.     Past Medical History   Past Medical History:  Diagnosis Date  . CHF (congestive heart failure)   . Diabetes   . Dysrhythmia    AFIB  . HTN (hypertension)   . Infection    LEG  . Pancreatitis      Active Problem List   Patient Active Problem List   Diagnosis Date Noted  . 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 Acute blood loss anemia   . Anemia of chronic disease   . Leukocytosis   . Chronic diastolic CHF (congestive heart failure)   . Moderate malnutrition 03/11/2021  . Acute osteomyelitis of right ankle 03/08/2021  . Open wound of right ankle 03/08/2021  . Lactic acidosis 03/08/2021  . Hypoalbuminemia due to protein-calorie malnutrition 03/08/2021  . Hyperglycemia due to diabetes mellitus 03/08/2021  . Hyponatremia 03/08/2021  . Mixed hyperlipidemia 03/08/2021  . Peripheral arterial disease 03/08/2021  . CHF (congestive heart failure) 02/02/2021  . Diabetes mellitus type 2, uncomplicated 02/02/2021  . Right leg  weakness 11/19/2019  . Vitamin B12 deficiency 05/20/2018  . Vitamin D deficiency 05/20/2018  . Cardiomyopathy 07/29/2013  . Asthma 02/19/2012  . Hypertension 02/19/2012  . Atrial fibrillation 10/27/2011  . GERD (gastroesophageal reflux disease) 10/27/2011  . Hyperlipidemia 10/27/2011     Past Surgical History   Past Surgical History:  Procedure Laterality Date  . AMPUTATION Right 03/09/2021   Procedure: AMPUTATION FOOT;  Surgeon: Annice Needy, MD;  Location: [facility] ORS;  Service: General;  Laterality: Right;  . AMPUTATION Right 03/11/2021   Procedure: AMPUTATION ABOVE KNEE;  Surgeon: Annice Needy, MD;  Location: [facility] ORS;  Service: General;  Laterality: Right;  . CATARACT EXTRACTION W/PHACO Right 03/08/2017   Procedure: CATARACT EXTRACTION PHACO AND INTRAOCULAR LENS PLACEMENT (IOC);  Surgeon: Nevada Crane, MD;  Location: [facility] ORS;  Service: Ophthalmology;  Laterality: Right;  Lot# [id number] H [place]: 00:30.9 AP%: 8.2 CDE: 2.54  . CATARACT EXTRACTION W/PHACO Left 04/04/2017   Procedure: CATARACT EXTRACTION PHACO AND INTRAOCULAR LENS PLACEMENT (IOC);  Surgeon: Nevada Crane, MD;  Location: [facility] ORS;  Service: Ophthalmology;  Laterality: Left;  [place] 00:31.4 AP% 12.0 CDE 3.78 Fluid Pack lot # [id number]  . CHOLECYSTECTOMY    . EYE SURGERY     right eye  . LOWER EXTREMITY ANGIOGRAPHY Right 02/10/2021   Procedure: LOWER EXTREMITY ANGIOGRAPHY;  Surgeon: Annice Needy, MD;  Location: [facility];  Service: Cardiovascular;  Laterality: Right;  .  LOWER EXTREMITY ANGIOGRAPHY Left 07/03/2022   Procedure: Lower Extremity Angiography;  Surgeon: Annice Needy, MD;  Location: [facility];  Service: Cardiovascular;  Laterality: Left;  . LOWER EXTREMITY ANGIOGRAPHY Left 07/24/2022   Procedure: Lower Extremity Angiography;  Surgeon: Annice Needy, MD;  Location: [facility];  Service: Cardiovascular;  Laterality: Left;     Home Medications   Prior to Admission medications    Medication Sig Start Date End Date Taking? Authorizing Provider  ascorbic acid (VITAMIN C) 500 MG tablet Take 500 mg by mouth daily. 10/06/21   [provider]  aspirin EC 81 MG tablet Take 1 tablet (81 mg total) by mouth daily. Swallow whole. 07/03/22 07/03/23  Annice Needy, MD  atorvastatin (LIPITOR) 10 MG tablet Take 1 tablet (10 mg total) by mouth daily. 02/10/21 06/06/22  Annice Needy, MD  carvedilol (COREG) 6.25 MG tablet Take 6.25 mg 2 (two) times daily by mouth. 12/19/16   [provider]  digoxin (LANOXIN) 0.125 MG tablet Take 0.125 mcg daily by mouth. 12/19/16   [provider]  ferrous gluconate (FERGON) 324 MG tablet Take 1 tablet (324 mg total) by mouth 2 (two) times daily with a meal. 03/16/21 06/06/22  Charise Killian, MD  ferrous sulfate 325 (65 FE) MG tablet Take by mouth. 05/11/21 06/06/22  [provider]  furosemide (LASIX) 20 MG tablet Take 20 mg daily by mouth. 12/19/16   [provider]  furosemide (LASIX) 20 MG tablet Take 1 tablet by mouth daily. 10/05/21   [provider]  gabapentin (NEURONTIN) 100 MG capsule Take 1 capsule (100 mg total) by mouth 3 (three) times daily. 03/16/21 06/06/22  Charise Killian, MD  lisinopril (PRINIVIL,ZESTRIL) 10 MG tablet Take 15 mg by mouth daily.    [provider]  metFORMIN (GLUCOPHAGE) 1000 MG tablet Take 1,000 mg by mouth 2 (two) times daily with a meal.    [provider]  rivaroxaban (XARELTO) 20 MG TABS tablet Take 20 mg daily by mouth.    [provider]  SSD 1 % cream Apply topically. Patient not taking: Reported on 07/03/2022 07/12/21   [provider]  vitamin B-12 (CYANOCOBALAMIN) 1000 MCG tablet Take 1,000 mcg by mouth daily.    [provider]     Allergies  Patient has no known allergies.   Family History  History reviewed. No pertinent family history.   Physical Exam  Triage Vital Signs: ED Triage Vitals  Enc Vitals  Group     BP 08/09/22 2303 98/63     Pulse Rate 08/09/22 2303 67     Resp 08/09/22 2303 18     Temp --      Temp Source 08/09/22 2303 Oral     SpO2 --      Weight 08/09/22 2259 196 lb 6.9 oz (89.1 kg)     Height 08/09/22 2259 6' (1.829 m)     Head Circumference --      Peak Flow --      Pain Score 08/09/22 2259 5     Pain Loc --      Pain Edu? --      Excl. in GC? --     Updated Vital Signs: BP 98/63 (BP Location: Left Arm)   Pulse 67   Temp 97.9 F (36.6 C) (Oral)   Resp 18   Ht 6' (1.829 m)   Wt 89.1 kg   SpO2 98%   BMI  26.64 kg/m    General: Awake, no distress.  Mildly dry mucous membranes.  Cachectic. CV:  RRR.  Good peripheral perfusion.  Resp:  Normal effort.  CTAB. Abd:  Nontender.  No distention.  Other:  No truncal vesicles.  Alert and oriented x 3.  CN II-XII grossly intact.   ED Results / Procedures / Treatments  Labs (all labs ordered are listed, but only abnormal results are displayed) Labs Reviewed  CBC WITH DIFFERENTIAL/PLATELET - Abnormal; Notable for the following components:      Result Value   WBC 25.6 (*)    RBC 4.21 (*)    Hemoglobin 12.0 (*)    HCT 35.6 (*)    Neutro Abs 21.1 (*)    Monocytes Absolute 2.0 (*)    Abs Immature Granulocytes 0.27 (*)    All other components within normal limits  COMPREHENSIVE METABOLIC PANEL - Abnormal; Notable for the following components:   Sodium 132 (*)    Glucose, Bld 179 (*)    Creatinine, Ser 1.72 (*)    Calcium 8.6 (*)    Albumin 2.6 (*)    Total Bilirubin 2.0 (*)    GFR, Estimated 40 (*)    All other components within normal limits  LACTIC ACID, PLASMA - Abnormal; Notable for the following components:   Lactic Acid, Venous 3.5 (*)    All other components within normal limits  RESP PANEL BY RT-PCR (RSV, FLU A&B, COVID)  RVPGX2  CULTURE, BLOOD (ROUTINE X 2)  CULTURE, BLOOD (ROUTINE X 2)  LACTIC ACID, PLASMA  URINALYSIS, W/ REFLEX TO CULTURE (INFECTION SUSPECTED)  DIGOXIN LEVEL  TROPONIN I  (HIGH SENSITIVITY)  TROPONIN I (HIGH SENSITIVITY)     EKG  ED ECG REPORT I, Tiffany Calmes J, the attending physician, personally viewed and interpreted this ECG.   Date: 08/09/2022  EKG Time: 2304  Rate: 67  Rhythm: atrial fibrillation, rate 67  Axis: Normal  Intervals: QTc 519  ST&T Change: Nonspecific No significant change compared to EKG dated 03/09/2021   RADIOLOGY I have independently visualized and interpreted patient's x-ray and CT scan as well as noted the radiology interpretation:  Chest x-ray: No acute cardiopulmonary process, cardiomegaly  CT head: No ICH  Official radiology report(s): CT Head Wo Contrast  Result Date: 08/10/2022 CLINICAL DATA:  Not eating well for 2 days. Lethargy and hypotension. EXAM: CT HEAD WITHOUT CONTRAST TECHNIQUE: Contiguous axial images were obtained from the base of the skull through the vertex without intravenous contrast. RADIATION DOSE REDUCTION: This exam was performed according to the departmental dose-optimization program which includes automated exposure control, adjustment of the mA and/or kV according to patient size and/or use of iterative reconstruction technique. COMPARISON:  CT head 02/24/2012 FINDINGS: Brain: No intracranial hemorrhage, mass effect, or evidence of acute infarct. No hydrocephalus. No extra-axial fluid collection. Generalized cerebral atrophy. Ill-defined hypoattenuation within the cerebral white matter is nonspecific but consistent with chronic small vessel ischemic disease. 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 Physiologic basal ganglia calcifications. Vascular: No hyperdense vessel. Intracranial arterial calcification. Skull: No fracture or focal lesion. Sinuses/Orbits: No acute finding. Paranasal sinuses and mastoid air cells are well aerated. Other: None. IMPRESSION: 1. No evidence of acute intracranial abnormality. Electronically Signed   By: Minerva Fester M.D.   On: 08/10/2022 00:14   DG Chest  Port 1 View  Result Date: 08/09/2022 CLINICAL DATA:  Weakness EXAM: PORTABLE CHEST 1 VIEW COMPARISON:  Radiographs 03/08/2021 FINDINGS: Stable cardiomegaly Aortic atherosclerotic calcification. No focal consolidation,  pleural effusion, or pneumothorax. No displaced rib fractures. IMPRESSION: 1. No active disease. 2. Cardiomegaly. Electronically Signed   By: Minerva Fester M.D.   On: 08/09/2022 23:31     PROCEDURES:  Critical Care performed: Yes, see critical care procedure note(s)  CRITICAL CARE Performed by: Irean Hong   Total critical care time: 45 minutes  Critical care time was exclusive of separately billable procedures and treating other patients.  Critical care was necessary to treat or prevent imminent or life-threatening deterioration.  Critical care was time spent personally by me on the following activities: development of treatment plan with patient and/or surrogate as well as nursing, discussions with consultants, evaluation of patient's response to treatment, examination of patient, obtaining history from patient or surrogate, ordering and performing treatments and interventions, ordering and review of laboratory studies, ordering and review of radiographic studies, pulse oximetry and re-evaluation of patient's condition.   Marland Kitchen1-3 Lead EKG Interpretation  Performed by: Irean Hong, MD Authorized by: Irean Hong, MD     Interpretation: normal     ECG rate:  67   ECG rate assessment: normal     Rhythm: sinus rhythm     Ectopy: none     Conduction: normal   Comments:     Patient placed on cardiac monitor to evaluate for arrhythmias    MEDICATIONS ORDERED IN ED: Medications  lactated ringers bolus 1,000 mL (1,000 mLs Intravenous New Bag/Given 08/10/22 0122)    And  lactated ringers bolus 1,000 mL (1,000 mLs Intravenous New Bag/Given 08/10/22 0050)    And  lactated ringers bolus 1,000 mL (1,000 mLs Intravenous New Bag/Given 08/10/22 0122)  metroNIDAZOLE (FLAGYL)  IVPB 500 mg (500 mg Intravenous New Bag/Given 08/10/22 0127)  vancomycin (VANCOREADY) IVPB 2000 mg/400 mL (has no administration in time range)  sodium chloride 0.9 % bolus 1,000 mL (0 mLs Intravenous Stopped 08/10/22 0045)  ceFEPIme (MAXIPIME) 2 g in sodium chloride 0.9 % 100 mL IVPB (0 g Intravenous Stopped 08/10/22 0125)     IMPRESSION / MDM / ASSESSMENT AND PLAN / ED COURSE  I reviewed the triage vital signs and the nursing notes.                             82 year old male who presents with generalized weakness and fatigue.  Differential diagnosis includes but is not limited to CVA, ACS, infectious, metabolic etiologies, etc.  I have personally reviewed patient's records and note a recent cardiology visit on 08/02/2022 for follow-up PAF, hypertension, CHF, nonischemic cardiomyopathy.  Patient's presentation is most consistent with acute presentation with potential threat to life or bodily function.  The patient is on the cardiac monitor to evaluate for evidence of arrhythmia and/or significant heart rate changes.  Will obtain sepsis workup given hypotension in the field, continue IV fluid hydration and reassess.  Clinical Course as of 08/10/22 0340  Thu Aug 10, 2022  0005 Lactic acid elevated, patient has leukocytosis with WBC 25.6, AKI 1.72.  Chest x-ray is clear.  Will initiate ED code sepsis order set with 30 cc/kilo IV fluids and broad-spectrum IV antibiotics. [JS]  0301 Sepsis reassessment: Lactic acid worsening.  IV fluids infusing. [JS]    Clinical Course User Index [JS] Irean Hong, MD     FINAL CLINICAL IMPRESSION(S) / ED DIAGNOSES   Final diagnoses:  Other fatigue  Generalized weakness  Sepsis, due to unspecified organism, unspecified whether acute organ dysfunction present  AKI (  acute kidney injury)     Rx / DC Orders   ED Discharge Orders     None        Note:  This document was prepared using Dragon voice recognition software and may include  unintentional dictation errors.   Irean Hong, MD 08/10/22 [phone]

## 2022-08-09 NOTE — ED Notes (Addendum)
Pt [name] [name] AKA. Open wounds noted to L leg and foot. Pt with hx DM

## 2022-08-09 NOTE — ED Triage Notes (Signed)
Family reported to EMS that pt not eating well x 2 days and then started today with lethargy and hypotension. Stated to EMS that they were unable to get him out of recliner to go to bed. EMS reports 88/54 bp at home manual. 400 mL IVF given and bp raised to 105 systolic. Pt denies pain or other complaints. Just states he's tired. EMS reports afib en route but bradycardic. Reports frequent unifocal PVC's and [place] placed and PVC frequency decreased.    Temp 98.3 RR 16 BGL 209

## 2022-08-10 ENCOUNTER — Inpatient Hospital Stay: Payer: Medicare HMO

## 2022-08-10 ENCOUNTER — Encounter: Payer: Self-pay | Admitting: Family Medicine

## 2022-08-10 ENCOUNTER — Encounter
Admission: EM | Disposition: A | Discharge status: Skilled Nursing Facility | Payer: Self-pay | Source: Home / Self Care | Attending: Internal Medicine

## 2022-08-10 DIAGNOSIS — D6832 Hemorrhagic disorder due to extrinsic circulating anticoagulants: Secondary | ICD-10-CM | POA: Diagnosis not present

## 2022-08-10 DIAGNOSIS — Z794 Long term (current) use of insulin: Secondary | ICD-10-CM | POA: Diagnosis not present

## 2022-08-10 DIAGNOSIS — M86172 Other acute osteomyelitis, left ankle and foot: Secondary | ICD-10-CM | POA: Diagnosis present

## 2022-08-10 DIAGNOSIS — R6520 Severe sepsis without septic shock: Secondary | ICD-10-CM | POA: Diagnosis present

## 2022-08-10 DIAGNOSIS — R001 Bradycardia, unspecified: Secondary | ICD-10-CM | POA: Diagnosis not present

## 2022-08-10 DIAGNOSIS — E1142 Type 2 diabetes mellitus with diabetic polyneuropathy: Secondary | ICD-10-CM | POA: Diagnosis present

## 2022-08-10 DIAGNOSIS — I5032 Chronic diastolic (congestive) heart failure: Secondary | ICD-10-CM | POA: Diagnosis present

## 2022-08-10 DIAGNOSIS — E1152 Type 2 diabetes mellitus with diabetic peripheral angiopathy with gangrene: Secondary | ICD-10-CM | POA: Diagnosis present

## 2022-08-10 DIAGNOSIS — A419 Sepsis, unspecified organism: Secondary | ICD-10-CM | POA: Diagnosis present

## 2022-08-10 DIAGNOSIS — I959 Hypotension, unspecified: Secondary | ICD-10-CM | POA: Diagnosis present

## 2022-08-10 DIAGNOSIS — Z9889 Other specified postprocedural states: Secondary | ICD-10-CM | POA: Diagnosis not present

## 2022-08-10 DIAGNOSIS — K219 Gastro-esophageal reflux disease without esophagitis: Secondary | ICD-10-CM | POA: Diagnosis present

## 2022-08-10 DIAGNOSIS — R319 Hematuria, unspecified: Secondary | ICD-10-CM | POA: Diagnosis not present

## 2022-08-10 DIAGNOSIS — E11649 Type 2 diabetes mellitus with hypoglycemia without coma: Secondary | ICD-10-CM | POA: Diagnosis not present

## 2022-08-10 DIAGNOSIS — R531 Weakness: Secondary | ICD-10-CM | POA: Diagnosis present

## 2022-08-10 DIAGNOSIS — I96 Gangrene, not elsewhere classified: Secondary | ICD-10-CM | POA: Diagnosis not present

## 2022-08-10 DIAGNOSIS — Z89611 Acquired absence of right leg above knee: Secondary | ICD-10-CM | POA: Diagnosis not present

## 2022-08-10 DIAGNOSIS — E1165 Type 2 diabetes mellitus with hyperglycemia: Secondary | ICD-10-CM | POA: Diagnosis not present

## 2022-08-10 DIAGNOSIS — R64 Cachexia: Secondary | ICD-10-CM | POA: Diagnosis present

## 2022-08-10 DIAGNOSIS — Z1152 Encounter for screening for COVID-19: Secondary | ICD-10-CM | POA: Diagnosis not present

## 2022-08-10 DIAGNOSIS — E782 Mixed hyperlipidemia: Secondary | ICD-10-CM | POA: Diagnosis present

## 2022-08-10 DIAGNOSIS — N179 Acute kidney failure, unspecified: Secondary | ICD-10-CM

## 2022-08-10 DIAGNOSIS — M86171 Other acute osteomyelitis, right ankle and foot: Secondary | ICD-10-CM | POA: Diagnosis present

## 2022-08-10 DIAGNOSIS — E1140 Type 2 diabetes mellitus with diabetic neuropathy, unspecified: Secondary | ICD-10-CM | POA: Diagnosis not present

## 2022-08-10 DIAGNOSIS — E785 Hyperlipidemia, unspecified: Secondary | ICD-10-CM | POA: Diagnosis not present

## 2022-08-10 DIAGNOSIS — I480 Paroxysmal atrial fibrillation: Secondary | ICD-10-CM | POA: Diagnosis present

## 2022-08-10 DIAGNOSIS — E1169 Type 2 diabetes mellitus with other specified complication: Secondary | ICD-10-CM | POA: Diagnosis present

## 2022-08-10 DIAGNOSIS — Z7984 Long term (current) use of oral hypoglycemic drugs: Secondary | ICD-10-CM | POA: Diagnosis not present

## 2022-08-10 DIAGNOSIS — R9082 White matter disease, unspecified: Secondary | ICD-10-CM | POA: Diagnosis not present

## 2022-08-10 DIAGNOSIS — I110 Hypertensive heart disease with heart failure: Secondary | ICD-10-CM | POA: Diagnosis present

## 2022-08-10 DIAGNOSIS — L03032 Cellulitis of left toe: Secondary | ICD-10-CM | POA: Diagnosis present

## 2022-08-10 DIAGNOSIS — E871 Hypo-osmolality and hyponatremia: Secondary | ICD-10-CM | POA: Diagnosis present

## 2022-08-10 DIAGNOSIS — D638 Anemia in other chronic diseases classified elsewhere: Secondary | ICD-10-CM | POA: Diagnosis present

## 2022-08-10 DIAGNOSIS — I4891 Unspecified atrial fibrillation: Secondary | ICD-10-CM | POA: Diagnosis not present

## 2022-08-10 DIAGNOSIS — I70262 Atherosclerosis of native arteries of extremities with gangrene, left leg: Secondary | ICD-10-CM | POA: Diagnosis not present

## 2022-08-10 DIAGNOSIS — Z7401 Bed confinement status: Secondary | ICD-10-CM | POA: Diagnosis not present

## 2022-08-10 DIAGNOSIS — Z7901 Long term (current) use of anticoagulants: Secondary | ICD-10-CM | POA: Diagnosis not present

## 2022-08-10 DIAGNOSIS — R609 Edema, unspecified: Secondary | ICD-10-CM | POA: Diagnosis not present

## 2022-08-10 LAB — CBC
HCT: 28 % — ABNORMAL LOW (ref 39.0–52.0)
Hemoglobin: 9.6 g/dL — ABNORMAL LOW (ref 13.0–17.0)
MCH: 29 pg (ref 26.0–34.0)
MCHC: 34.3 g/dL (ref 30.0–36.0)
MCV: 84.6 fL (ref 80.0–100.0)
Platelets: 176 10*3/uL (ref 150–400)
RBC: 3.31 MIL/uL — ABNORMAL LOW (ref 4.22–5.81)
RDW: 13.8 % (ref 11.5–15.5)
WBC: 22.3 10*3/uL — ABNORMAL HIGH (ref 4.0–10.5)
nRBC: 0 % (ref 0.0–0.2)

## 2022-08-10 LAB — COMPREHENSIVE METABOLIC PANEL
ALT: 10 U/L (ref 0–44)
AST: 29 U/L (ref 15–41)
Albumin: 2.6 g/dL — ABNORMAL LOW (ref 3.5–5.0)
Alkaline Phosphatase: 87 U/L (ref 38–126)
Anion gap: 11 (ref 5–15)
BUN: 13 mg/dL (ref 8–23)
CO2: 23 mmol/L (ref 22–32)
Calcium: 8.6 mg/dL — ABNORMAL LOW (ref 8.9–10.3)
Chloride: 98 mmol/L (ref 98–111)
Creatinine, Ser: 1.72 mg/dL — ABNORMAL HIGH (ref 0.61–1.24)
GFR, Estimated: 40 mL/min — ABNORMAL LOW (ref >60)
Glucose, Bld: 179 mg/dL — ABNORMAL HIGH (ref 70–99)
Potassium: 4.5 mmol/L (ref 3.5–5.1)
Sodium: 132 mmol/L — ABNORMAL LOW (ref 135–145)
Total Bilirubin: 2 mg/dL — ABNORMAL HIGH (ref 0.3–1.2)
Total Protein: 7.1 g/dL (ref 6.5–8.1)

## 2022-08-10 LAB — CBC WITH DIFFERENTIAL/PLATELET
Abs Immature Granulocytes: 0.27 10*3/uL — ABNORMAL HIGH (ref 0.00–0.07)
Basophils Absolute: 0.1 10*3/uL (ref 0.0–0.1)
Basophils Relative: 0 %
Eosinophils Absolute: 0 10*3/uL (ref 0.0–0.5)
Eosinophils Relative: 0 %
HCT: 35.6 % — ABNORMAL LOW (ref 39.0–52.0)
Hemoglobin: 12 g/dL — ABNORMAL LOW (ref 13.0–17.0)
Immature Granulocytes: 1 %
Lymphocytes Relative: 8 %
Lymphs Abs: 2.1 10*3/uL (ref 0.7–4.0)
MCH: 28.5 pg (ref 26.0–34.0)
MCHC: 33.7 g/dL (ref 30.0–36.0)
MCV: 84.6 fL (ref 80.0–100.0)
Monocytes Absolute: 2 10*3/uL — ABNORMAL HIGH (ref 0.1–1.0)
Monocytes Relative: 8 %
Neutro Abs: 21.1 10*3/uL — ABNORMAL HIGH (ref 1.7–7.7)
Neutrophils Relative %: 83 %
Platelets: 195 10*3/uL (ref 150–400)
RBC: 4.21 MIL/uL — ABNORMAL LOW (ref 4.22–5.81)
RDW: 13.8 % (ref 11.5–15.5)
Smear Review: NORMAL
WBC: 25.6 10*3/uL — ABNORMAL HIGH (ref 4.0–10.5)
nRBC: 0 % (ref 0.0–0.2)

## 2022-08-10 LAB — GLUCOSE, CAPILLARY
Glucose-Capillary: 104 mg/dL — ABNORMAL HIGH (ref 70–99)
Glucose-Capillary: 100 mg/dL — ABNORMAL HIGH (ref 70–99)
Glucose-Capillary: 96 mg/dL (ref 70–99)

## 2022-08-10 LAB — URINALYSIS, W/ REFLEX TO CULTURE (INFECTION SUSPECTED)
Bacteria, UA: NONE SEEN
Bilirubin Urine: NEGATIVE
Bilirubin Urine: NEGATIVE
Glucose, UA: NEGATIVE mg/dL
Glucose, UA: NEGATIVE mg/dL
Ketones, ur: NEGATIVE mg/dL
Ketones, ur: NEGATIVE mg/dL
Leukocytes,Ua: NEGATIVE
Leukocytes,Ua: NEGATIVE
Nitrite: NEGATIVE
Nitrite: NEGATIVE
Protein, ur: NEGATIVE mg/dL
Protein, ur: 30 mg/dL — AB
RBC / HPF: >50 RBC/hpf (ref 0–5)
RBC / HPF: >50 RBC/hpf (ref 0–5)
Specific Gravity, Urine: 1.013 (ref 1.005–1.030)
Specific Gravity, Urine: 1.009 (ref 1.005–1.030)
pH: 5 (ref 5.0–8.0)
pH: 5 (ref 5.0–8.0)

## 2022-08-10 LAB — BASIC METABOLIC PANEL
Anion gap: 9 (ref 5–15)
BUN: 12 mg/dL (ref 8–23)
CO2: 23 mmol/L (ref 22–32)
Calcium: 8.1 mg/dL — ABNORMAL LOW (ref 8.9–10.3)
Chloride: 104 mmol/L (ref 98–111)
Creatinine, Ser: 1.35 mg/dL — ABNORMAL HIGH (ref 0.61–1.24)
GFR, Estimated: 53 mL/min — ABNORMAL LOW (ref >60)
Glucose, Bld: 92 mg/dL (ref 70–99)
Potassium: 3.6 mmol/L (ref 3.5–5.1)
Sodium: 136 mmol/L (ref 135–145)

## 2022-08-10 LAB — LACTIC ACID, PLASMA
Lactic Acid, Venous: 3.7 mmol/L (ref 0.5–1.9)
Lactic Acid, Venous: 1.7 mmol/L (ref 0.5–1.9)

## 2022-08-10 LAB — RESP PANEL BY RT-PCR (RSV, FLU A&B, COVID)  RVPGX2
Influenza A by PCR: NEGATIVE
Influenza B by PCR: NEGATIVE
Resp Syncytial Virus by PCR: NEGATIVE
SARS Coronavirus 2 by RT PCR: NEGATIVE

## 2022-08-10 LAB — TROPONIN I (HIGH SENSITIVITY)
Troponin I (High Sensitivity): 9 ng/L (ref <18)
Troponin I (High Sensitivity): 10 ng/L (ref <18)

## 2022-08-10 LAB — PROTIME-INR
INR: 4.5 (ref 0.8–1.2)
Prothrombin Time: 42.5 seconds — ABNORMAL HIGH (ref 11.4–15.2)

## 2022-08-10 LAB — CBG MONITORING, ED
Glucose-Capillary: 105 mg/dL — ABNORMAL HIGH (ref 70–99)
Glucose-Capillary: 66 mg/dL — ABNORMAL LOW (ref 70–99)
Glucose-Capillary: 104 mg/dL — ABNORMAL HIGH (ref 70–99)
Glucose-Capillary: 85 mg/dL (ref 70–99)

## 2022-08-10 LAB — DIGOXIN LEVEL: Digoxin Level: 0.5 ng/mL — ABNORMAL LOW (ref 0.8–2.0)

## 2022-08-10 LAB — HEMOGLOBIN A1C
Hgb A1c MFr Bld: 5.5 % (ref 4.8–5.6)
Mean Plasma Glucose: 111.15 mg/dL

## 2022-08-10 LAB — URINE CULTURE

## 2022-08-10 LAB — PROCALCITONIN: Procalcitonin: 0.25 ng/mL

## 2022-08-10 LAB — CORTISOL-AM, BLOOD: Cortisol - AM: 9.3 ug/dL (ref 6.7–22.6)

## 2022-08-10 SURGERY — LOWER EXTREMITY ANGIOGRAPHY
Anesthesia: Moderate Sedation | Laterality: Left

## 2022-08-10 MED ORDER — MORPHINE SULFATE (PF) 2 MG/ML IV SOLN
2.0000 mg | INTRAVENOUS | Status: DC | PRN
  Administered 2022-08-13: 2 mg via INTRAVENOUS
  Filled 2022-08-10: qty 1

## 2022-08-10 MED ORDER — METRONIDAZOLE 500 MG/100ML IV SOLN
500.0000 mg | Freq: Once | INTRAVENOUS | Status: AC
  Administered 2022-08-10: 500 mg via INTRAVENOUS
  Filled 2022-08-10: qty 100

## 2022-08-10 MED ORDER — ONDANSETRON HCL 4 MG/2ML IJ SOLN: 4.0000 mg | Freq: Four times a day (QID) | INTRAMUSCULAR | Status: DC | PRN

## 2022-08-10 MED ORDER — LACTATED RINGERS IV BOLUS (SEPSIS)
1000.0000 mL | Freq: Once | INTRAVENOUS | Status: AC
  Administered 2022-08-10: 1000 mL via INTRAVENOUS

## 2022-08-10 MED ORDER — DEXTROSE 50 % IV SOLN: 12.5000 g | Freq: Once | INTRAVENOUS | Status: AC

## 2022-08-10 MED ORDER — MAGNESIUM HYDROXIDE 400 MG/5ML PO SUSP: 30.0000 mL | Freq: Every day | ORAL | Status: DC | PRN

## 2022-08-10 MED ORDER — CARVEDILOL 6.25 MG PO TABS
6.2500 mg | ORAL_TABLET | Freq: Two times a day (BID) | ORAL | Status: DC
  Administered 2022-08-10 – 2022-08-15 (×9): 6.25 mg via ORAL
  Filled 2022-08-10 (×9): qty 1

## 2022-08-10 MED ORDER — ACETAMINOPHEN 650 MG RE SUPP: 650.0000 mg | Freq: Four times a day (QID) | RECTAL | Status: DC | PRN

## 2022-08-10 MED ORDER — FERROUS GLUCONATE 324 (38 FE) MG PO TABS: 324.0000 mg | ORAL_TABLET | Freq: Two times a day (BID) | ORAL | Status: DC

## 2022-08-10 MED ORDER — TRAZODONE HCL 50 MG PO TABS
25.0000 mg | ORAL_TABLET | Freq: Every evening | ORAL | Status: DC | PRN
  Administered 2022-08-13 – 2022-08-14 (×2): 25 mg via ORAL
  Filled 2022-08-10 (×2): qty 1

## 2022-08-10 MED ORDER — GABAPENTIN 100 MG PO CAPS
100.0000 mg | ORAL_CAPSULE | Freq: Three times a day (TID) | ORAL | Status: DC
  Administered 2022-08-10 – 2022-08-15 (×16): 100 mg via ORAL
  Filled 2022-08-10 (×16): qty 1

## 2022-08-10 MED ORDER — ACETAMINOPHEN 325 MG RE SUPP: 650.0000 mg | Freq: Four times a day (QID) | RECTAL | Status: DC | PRN

## 2022-08-10 MED ORDER — LACTATED RINGERS IV SOLN
150.0000 mL/h | INTRAVENOUS | Status: DC
  Administered 2022-08-10: 150 mL/h via INTRAVENOUS

## 2022-08-10 MED ORDER — ACETAMINOPHEN 325 MG PO TABS: 650.0000 mg | ORAL_TABLET | Freq: Four times a day (QID) | ORAL | Status: DC | PRN

## 2022-08-10 MED ORDER — ONDANSETRON HCL 4 MG PO TABS: 4.0000 mg | ORAL_TABLET | Freq: Four times a day (QID) | ORAL | Status: DC | PRN

## 2022-08-10 MED ORDER — VANCOMYCIN HCL 1250 MG/250ML IV SOLN: 1250.0000 mg | INTRAVENOUS | Status: DC

## 2022-08-10 MED ORDER — VITAMIN B-12 1000 MCG PO TABS
1000.0000 ug | ORAL_TABLET | Freq: Every day | ORAL | Status: DC
  Administered 2022-08-10 – 2022-08-15 (×5): 1000 ug via ORAL
  Filled 2022-08-10: qty 2
  Filled 2022-08-10 (×4): qty 1

## 2022-08-10 MED ORDER — LISINOPRIL 10 MG PO TABS
15.0000 mg | ORAL_TABLET | Freq: Every day | ORAL | Status: DC
  Administered 2022-08-10 – 2022-08-15 (×5): 15 mg via ORAL
  Filled 2022-08-10 (×5): qty 2

## 2022-08-10 MED ORDER — DEXTROSE 50 % IV SOLN
INTRAVENOUS | Status: AC
  Administered 2022-08-10: 12.5 g via INTRAVENOUS
  Filled 2022-08-10: qty 50

## 2022-08-10 MED ORDER — FUROSEMIDE 20 MG PO TABS
20.0000 mg | ORAL_TABLET | Freq: Every day | ORAL | Status: DC | PRN
  Filled 2022-08-10: qty 1

## 2022-08-10 MED ORDER — SODIUM CHLORIDE 0.9 % IV SOLN
2.0000 g | Freq: Once | INTRAVENOUS | Status: AC
  Administered 2022-08-10: 2 g via INTRAVENOUS
  Filled 2022-08-10: qty 12.5

## 2022-08-10 MED ORDER — ACETAMINOPHEN 325 MG PO TABS
650.0000 mg | ORAL_TABLET | Freq: Four times a day (QID) | ORAL | Status: DC | PRN
  Administered 2022-08-12: 650 mg via ORAL
  Filled 2022-08-10: qty 2

## 2022-08-10 MED ORDER — ENOXAPARIN SODIUM 40 MG/0.4ML IJ SOSY
40.0000 mg | PREFILLED_SYRINGE | INTRAMUSCULAR | Status: DC
  Administered 2022-08-12 – 2022-08-14 (×2): 40 mg via SUBCUTANEOUS
  Filled 2022-08-10 (×3): qty 0.4

## 2022-08-10 MED ORDER — VITAMIN C 500 MG PO TABS
500.0000 mg | ORAL_TABLET | Freq: Every day | ORAL | Status: DC
  Administered 2022-08-11 – 2022-08-15 (×4): 500 mg via ORAL
  Filled 2022-08-10 (×3): qty 1

## 2022-08-10 MED ORDER — VANCOMYCIN HCL IN DEXTROSE 1-5 GM/200ML-% IV SOLN
1000.0000 mg | Freq: Once | INTRAVENOUS | Status: DC
Start: 2022-08-10 — End: 2022-08-10

## 2022-08-10 MED ORDER — LACTATED RINGERS IV SOLN: INTRAVENOUS | Status: DC

## 2022-08-10 MED ORDER — INSULIN ASPART 100 UNIT/ML IJ SOLN
0.0000 [IU] | INTRAMUSCULAR | Status: DC
  Administered 2022-08-11 – 2022-08-13 (×2): 2 [IU] via SUBCUTANEOUS
  Filled 2022-08-10 (×2): qty 1

## 2022-08-10 MED ORDER — VANCOMYCIN HCL IN DEXTROSE 1-5 GM/200ML-% IV SOLN: 1000.0000 mg | Freq: Once | INTRAVENOUS | Status: DC

## 2022-08-10 MED ORDER — DEXTROSE 5 % IV SOLN: Freq: Once | INTRAVENOUS | Status: AC

## 2022-08-10 MED ORDER — SODIUM CHLORIDE 0.9 % IV SOLN
2.0000 g | Freq: Two times a day (BID) | INTRAVENOUS | Status: DC
  Administered 2022-08-10 – 2022-08-13 (×8): 2 g via INTRAVENOUS
  Filled 2022-08-10: qty 12.5
  Filled 2022-08-10 (×2): qty 2
  Filled 2022-08-10 (×3): qty 12.5
  Filled 2022-08-10 (×2): qty 2
  Filled 2022-08-10: qty 12.5

## 2022-08-10 MED ORDER — FUROSEMIDE 40 MG PO TABS: 20.0000 mg | ORAL_TABLET | Freq: Every day | ORAL | Status: DC

## 2022-08-10 MED ORDER — DIGOXIN 125 MCG PO TABS
0.1250 mg | ORAL_TABLET | Freq: Every day | ORAL | Status: DC
  Administered 2022-08-10 – 2022-08-15 (×5): 0.125 mg via ORAL
  Filled 2022-08-10 (×6): qty 1

## 2022-08-10 MED ORDER — FERROUS SULFATE 325 (65 FE) MG PO TABS
325.0000 mg | ORAL_TABLET | Freq: Every day | ORAL | Status: DC
  Administered 2022-08-10 – 2022-08-15 (×5): 325 mg via ORAL
  Filled 2022-08-10 (×5): qty 1

## 2022-08-10 MED ORDER — METRONIDAZOLE 500 MG/100ML IV SOLN: 500.0000 mg | Freq: Two times a day (BID) | INTRAVENOUS | Status: DC

## 2022-08-10 MED ORDER — VANCOMYCIN HCL 2000 MG/400ML IV SOLN
2000.0000 mg | Freq: Once | INTRAVENOUS | Status: AC
  Administered 2022-08-10: 2000 mg via INTRAVENOUS
  Filled 2022-08-10: qty 400

## 2022-08-10 MED ORDER — ATORVASTATIN CALCIUM 20 MG PO TABS
10.0000 mg | ORAL_TABLET | Freq: Every day | ORAL | Status: DC
  Administered 2022-08-10 – 2022-08-15 (×5): 10 mg via ORAL
  Filled 2022-08-10 (×5): qty 1

## 2022-08-10 NOTE — Assessment & Plan Note (Signed)
-   We will continue antihypertensives while monitoring improving renal functions.

## 2022-08-10 NOTE — Progress Notes (Signed)
PHARMACY -  BRIEF ANTIBIOTIC NOTE   Pharmacy has received consult(s) for Vancomycin from an ED provider.  The patient's profile has been reviewed for ht/wt/allergies/indication/available labs.    One time order(s) placed for Vancomycin 2 gm per pt wt: 89.1 kg  Further antibiotics/pharmacy consults should be ordered by admitting physician if indicated.                       Thank you, Otelia Sergeant, PharmD, [facility] 08/10/2022 1:03 AM

## 2022-08-10 NOTE — Progress Notes (Addendum)
[facility]  - [place] at [facility]   PATIENT NAME: [name] [name]    MR#:  [id number]  DATE OF BIRTH:  12-Feb-1941  SUBJECTIVE:  Came in with fatigue, weakness and found to have low blood pressure. Patient received IV fluids much improved. Has left second to infection with foul-smelling discharge and blackened toe for several days. Recently had angiogram two weeks ago per wife. No fever nausea vomiting or chest pain    VITALS:  Blood pressure (!) 147/76, pulse 84, temperature 98 F (36.7 C), temperature source Oral, resp. rate (!) 21, height 6' (1.829 m), weight 89.1 kg, SpO2 98 %.  PHYSICAL EXAMINATION:   GENERAL:  82 y.o.-year-old patient with no acute distress.  LUNGS: Normal breath sounds bilaterally, no wheezing CARDIOVASCULAR: S1, S2 normal. No murmur   ABDOMEN: Soft, nontender, nondistended. Bowel sounds present.  EXTREMITIES:  NEUROLOGIC: nonfocal  patient is alert and awake SKIN: as above  LABORATORY PANEL:  CBC Recent Labs  Lab 08/10/22 0418  WBC 22.3*  HGB 9.6*  HCT 28.0*  PLT 176    Chemistries  Recent Labs  Lab 08/09/22 2323 08/10/22 0545  NA 132* 136  K 4.5 3.6  CL 98 104  CO2 23 23  GLUCOSE 179* 92  BUN 13 12  CREATININE 1.72* 1.35*  CALCIUM 8.6* 8.1*  AST 29  --   ALT 10  --   ALKPHOS 87  --   BILITOT 2.0*  --    Cardiac Enzymes No results for input(s): "TROPONINI" in the last 168 hours. RADIOLOGY:  DG Foot 2 Views Left  Result Date: 08/10/2022 CLINICAL DATA:  [id number] with sepsis due to cellulitis. EXAM: LEFT FOOT - 2 VIEW COMPARISON:  None Available. FINDINGS: There is mild diffuse edema. There are vascular calcifications in the distal foreleg and foot, with extension into the great toe. No soft tissue gas or radiopaque foreign body are seen. Normal bone mineralization. There is a well-marginated erosive lesion in the medial aspect of the distal first metatarsal measuring 1.5 cm across and at least 5 mm in depth. There is a  thin sclerotic border and narrow zone of transition to the underlying normal bone. There are no visible calcifications in the erosion. There are no other erosive lesions and no aggressive destructive changes. Evidence of fractures is not seen. There are mild features of degenerative arthrosis of the first MTP joint and a small plantar calcaneal spur. IMPRESSION: 1. Mild diffuse edema. No soft tissue gas or radiopaque foreign body. 2. Well-marginated erosive lesion in the medial aspect of the distal first metatarsal, without visible calcifications. This is nonspecific but could be due to gout or chronic osteomyelitis. 3. 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 4. Vascular calcifications in the distal foreleg and foot. Electronically Signed   By: Almira Bar M.D.   On: 08/10/2022 07:21   CT Head Wo Contrast  Result Date: 08/10/2022 CLINICAL DATA:  Not eating well for 2 days. Lethargy and hypotension. EXAM: CT HEAD WITHOUT CONTRAST TECHNIQUE: Contiguous axial images were obtained from the base of the skull through the vertex without intravenous contrast. RADIATION DOSE REDUCTION: This exam was performed according to the departmental dose-optimization program which includes automated exposure control, adjustment of the mA and/or kV according to patient size and/or use of iterative reconstruction technique. COMPARISON:  CT head 02/24/2012 FINDINGS: Brain: No intracranial hemorrhage, mass effect, or evidence of acute infarct. No hydrocephalus. No extra-axial fluid collection. Generalized cerebral atrophy. Ill-defined hypoattenuation within the  cerebral white matter is nonspecific but consistent with chronic small vessel ischemic disease. 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 Physiologic basal ganglia calcifications. Vascular: No hyperdense vessel. Intracranial arterial calcification. Skull: No fracture or focal lesion. Sinuses/Orbits: No acute finding. Paranasal sinuses and mastoid  air cells are well aerated. Other: None. IMPRESSION: 1. No evidence of acute intracranial abnormality. Electronically Signed   By: Minerva Fester M.D.   On: 08/10/2022 00:14   DG Chest Port 1 View  Result Date: 08/09/2022 CLINICAL DATA:  Weakness EXAM: PORTABLE CHEST 1 VIEW COMPARISON:  Radiographs 03/08/2021 FINDINGS: Stable cardiomegaly Aortic atherosclerotic calcification. No focal consolidation, pleural effusion, or pneumothorax. No displaced rib fractures. IMPRESSION: 1. No active disease. 2. Cardiomegaly. Electronically Signed   By: Minerva Fester M.D.   On: 08/09/2022 23:31    Assessment and Plan  [name] [name] is a 82 y.o. male brought to the ED via EMS from home with a chief complaint of generalized weakness and fatigue.  Patient with a history of CHF, atrial fibrillation on Xarelto, on Digoxin.  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 -- Patient was started on broad-spectrum antibiotic with vancomycin, Flagyl,cefepime-->cont cefepime -- blood pressure much improved. Lactic acid normalized. No fever. -- Podiatry consultation with Dr. Wilber Bihari. follow-up.--?MRI foot  Severe peripheral arterial disease -- patient underwent left lower extremity angiogram in March and April per family -- Dr. Wyn Quaker had to hold off on angiogram lower extremity due to elevated INR. Patient on Xarelto. -- Further workup per Dr. Wyn Quaker -- Xarelto on hold  Type II diabetes, hypoglycemia, peripheral neuropathy -- A1c 5.5 -- sliding scale insulin -- continue Neurontin -- received IV dextrose drip. Sugars now stable  Hypertension -- continue home  meds  Hyperlipidemia -- continue statins  Hematuria in the setting of INR 4.5 (on xarelto) --send urine culture --cont IV abxs --monitor H and H  --consider urology consult if symptoms worsen    Procedures: Family communication :wife at bedside Consults : podiatry, vascular CODE STATUS: full DVT Prophylaxis :Lovenox Level of care: Telemetry Medical Status is: Inpatient Remains inpatient appropriate because: sepsis, left 2nd toe gangrene    TOTAL TIME TAKING CARE OF THIS PATIENT: 35 minutes.  >50% time spent on counselling and coordination of care  Note: This dictation was prepared with Dragon dictation along with smaller phrase technology. Any transcriptional errors that result from this process are unintentional.  Enedina Finner M.D    [facility]   CC: Primary care physician; Luciana Axe, NP

## 2022-08-10 NOTE — ED Notes (Signed)
Pt incontinent of stool. Pt cleansed and new brief applied. Peri care completed. Linens on bed changed for clean ones. Pt then repositioned back in bed for comfort. In and out cath completed per MD orders and UA collected. Pt currently resting in bed free from sign of distress. Breathing unlabored speaking in full sentences with symmetric chest rise and fall. Bed low and locked with side rails raised x2. Call bell in reach and monitor in place.

## 2022-08-10 NOTE — Progress Notes (Signed)
Pharmacy Antibiotic Note  [name] [name] is a 82 y.o. male admitted on 08/09/2022 with sepsis.  Pharmacy has been consulted for Cefepime & Vancomycin dosing x 7 days.  Plan: Cefepime 2 gm q12hr per indication & renal fxn.  Pt given Vancomycin 2000 mg once. Vancomycin 1250 mg IV Q 24 hrs. Goal AUC 400-550. Expected AUC: 547.2 SCr used: 1.72  Pharmacy will continue to follow and will adjust abx dosing whenever warranted.  Temp (24hrs), Avg:98 F (36.7 C), Min:97.9 F (36.6 C), Max:98 F (36.7 C)   Recent Labs  Lab 08/09/22 2323 08/10/22 0117  WBC 25.6*  --   CREATININE 1.72*  --   LATICACIDVEN 3.5* 3.7*    Estimated Creatinine Clearance: 37.6 mL/min (A) (by C-G formula based on SCr of 1.72 mg/dL (H)).    No Known Allergies  Antimicrobials this admission: 4/18 Cefepime >> x 7 days 4/18 Flagyl >> x 7 days 4/18 Vancomycin >> x 7 days  Microbiology results: 4/18 BCx: Pending  Thank you for allowing pharmacy to be a part of this patient's care.  Otelia Sergeant, PharmD, [facility] 08/10/2022 3:56 AM

## 2022-08-10 NOTE — Sepsis Progress Note (Signed)
Elink monitoring for the code sepsis protocol.  

## 2022-08-10 NOTE — Assessment & Plan Note (Signed)
-   The patient will be placed on supplemental coverage with NovoLog. - We will hold off metformin. - We will continue Neurontin. 

## 2022-08-10 NOTE — Consult Note (Signed)
Hospital Consult    Reason for Consult:  Left Lower Extremity wound/ulcers Requesting Physician:  Dr Valente David MD MRN #:  [id number]  History of Present Illness: This is a 82 y.o. male  African-American male with medical history significant for CHF, type 2 diabetes mellitus, hypertension and pancreatitis, who presented to the emergency room with acute onset of generalized weakness with significant anorexia over the last couple of days.  He has been having dysuria and diminished urinary output.  No hematuria or flank pain.  He denies any fever or chills.  No chest pain or dyspnea or cough.  Has been having occasional wheezing.  No melena or bright red being per rectum.  No other bleeding diathesis.   Vascular surgery was consulted for left lower extremity gangrene toes and open sores. This patient is well know to vascular surgery. He was last seen July 24, 2022 and had an angiogram to the left lower extremity with angioplasty to the post tibial artery.   Today in the emergency room the patient is resting comfortably in a stretcher.  If lower extremity has an open ulcer on the calf and his great toe and second toe are gangrenous.  Left lower extremity is very malodorous at this time.  Patient has no other complaints.  Vitals all remained stable.  Past Medical History:  Diagnosis Date   CHF (congestive heart failure)    Diabetes    Dysrhythmia    AFIB   HTN (hypertension)    Infection    LEG   Pancreatitis     Past Surgical History:  Procedure Laterality Date   AMPUTATION Right 03/09/2021   Procedure: AMPUTATION FOOT;  Surgeon: Annice Needy, MD;  Location: [facility];  Service: General;  Laterality: Right;   AMPUTATION Right 03/11/2021   Procedure: AMPUTATION ABOVE KNEE;  Surgeon: Annice Needy, MD;  Location: [facility];  Service: General;  Laterality: Right;   CATARACT EXTRACTION W/PHACO Right 03/08/2017   Procedure: CATARACT EXTRACTION PHACO AND INTRAOCULAR LENS PLACEMENT (IOC);   Surgeon: Nevada Crane, MD;  Location: [facility];  Service: Ophthalmology;  Laterality: Right;  Lot# [id number] [place]: 00:30.9 AP%: 8.2 CDE: 2.54   CATARACT EXTRACTION W/PHACO Left 04/04/2017   Procedure: CATARACT EXTRACTION PHACO AND INTRAOCULAR LENS PLACEMENT (IOC);  Surgeon: Nevada Crane, MD;  Location: [facility];  Service: Ophthalmology;  Laterality: Left;  [place] 00:31.4 AP% 12.0 CDE 3.78 Fluid Pack lot # [id number]   CHOLECYSTECTOMY     EYE SURGERY     right eye   LOWER EXTREMITY ANGIOGRAPHY Right 02/10/2021   Procedure: LOWER EXTREMITY ANGIOGRAPHY;  Surgeon: Annice Needy, MD;  Location: [facility];  Service: Cardiovascular;  Laterality: Right;   LOWER EXTREMITY ANGIOGRAPHY Left 07/03/2022   Procedure: Lower Extremity Angiography;  Surgeon: Annice Needy, MD;  Location: [facility];  Service: Cardiovascular;  Laterality: Left;   LOWER EXTREMITY ANGIOGRAPHY Left 07/24/2022   Procedure: Lower Extremity Angiography;  Surgeon: Annice Needy, MD;  Location: [facility];  Service: Cardiovascular;  Laterality: Left;    No Known Allergies  Prior to Admission medications   Medication Sig Start Date End Date Taking? Authorizing Provider  ascorbic acid (VITAMIN C) 500 MG tablet Take 500 mg by mouth daily. 10/06/21   [provider]  aspirin EC 81 MG tablet Take 1 tablet (81 mg total) by mouth daily. Swallow whole. 07/03/22 07/03/23  Annice Needy, MD  atorvastatin (LIPITOR) 10 MG tablet Take  1 tablet (10 mg total) by mouth daily. 02/10/21 06/06/22  Annice Needy, MD  carvedilol (COREG) 6.25 MG tablet Take 6.25 mg 2 (two) times daily by mouth. 12/19/16   [provider]  digoxin (LANOXIN) 0.125 MG tablet Take 0.125 mcg daily by mouth. 12/19/16   [provider]  ferrous gluconate (FERGON) 324 MG tablet Take 1 tablet (324 mg total) by mouth 2 (two) times daily with a meal. 03/16/21 06/06/22  Charise Killian, MD  ferrous sulfate 325 (65 FE) MG tablet  Take by mouth. 05/11/21 06/06/22  [provider]  furosemide (LASIX) 20 MG tablet Take 20 mg daily by mouth. 12/19/16   [provider]  furosemide (LASIX) 20 MG tablet Take 1 tablet by mouth daily. 10/05/21   [provider]  gabapentin (NEURONTIN) 100 MG capsule Take 1 capsule (100 mg total) by mouth 3 (three) times daily. 03/16/21 06/06/22  Charise Killian, MD  lisinopril (PRINIVIL,ZESTRIL) 10 MG tablet Take 15 mg by mouth daily.    [provider]  metFORMIN (GLUCOPHAGE) 1000 MG tablet Take 1,000 mg by mouth 2 (two) times daily with a meal.    [provider]  rivaroxaban (XARELTO) 20 MG TABS tablet Take 20 mg daily by mouth.    [provider]  SSD 1 % cream Apply topically. Patient not taking: Reported on 07/03/2022 07/12/21   [provider]  vitamin B-12 (CYANOCOBALAMIN) 1000 MCG tablet Take 1,000 mcg by mouth daily.    [provider]    Social History   Socioeconomic History   Marital status: Married    Spouse name: Not on file   Number of children: Not on file   Years of education: Not on file   Highest education level: Not on file  Occupational History   Not on file  Tobacco Use   Smoking status: Never   Smokeless tobacco: Current    Types: Chew  Substance and Sexual Activity   Alcohol use: No    Alcohol/week: 0.0 standard drinks of alcohol   Drug use: No   Sexual activity: Not on file  Other Topics Concern   Not on file  Social History Narrative   Not on file   Social Determinants of Health   Financial Resource Strain: Not on file  Food Insecurity: Not on file  Transportation Needs: Not on file  Physical Activity: Not on file  Stress: Not on file  Social Connections: Not on file  Intimate Partner Violence: Not on file     History reviewed. No pertinent family history.  ROS: Otherwise negative unless mentioned in HPI  Physical Examination  Vitals:   08/10/22 0530 08/10/22 0600   BP: 120/74 111/64  Pulse: 63 67  Resp: 11 11  Temp:    SpO2: 99% 99%   Body mass index is 26.64 kg/m.  General:  WDWN in NAD Gait: Not observed HENT: WNL, normocephalic Pulmonary: normal non-labored breathing, without Rales, rhonchi,  wheezing Cardiac: regular, without  Murmurs, rubs or gallops; without carotid bruits Abdomen: Positive bowel sounds, soft, Pos Tenderness/ND, no masses Skin: without rashes Vascular Exam/Pulses: Right AKA, Left lower extremity without palpable pulses due to +2 edema.  Extremities: with ischemic changes, with Gangrene , with cellulitis; with open wounds;  Musculoskeletal: no muscle wasting or atrophy  Neurologic: A&O X 3;  No focal weakness or paresthesias are detected; speech is fluent/normal Psychiatric:  The pt has Normal affect. Lymph:  Unremarkable  CBC    Component  Value Date/Time   WBC 22.3 (H) 08/10/2022 0418   RBC 3.31 (L) 08/10/2022 0418   HGB 9.6 (L) 08/10/2022 0418   HGB 14.7 02/24/2012 0325   HCT 28.0 (L) 08/10/2022 0418   HCT 44.4 02/24/2012 0325   PLT 176 08/10/2022 0418   PLT 108 (L) 02/24/2012 0325   MCV 84.6 08/10/2022 0418   MCV 90 02/24/2012 0325   MCH 29.0 08/10/2022 0418   MCHC 34.3 08/10/2022 0418   RDW 13.8 08/10/2022 0418   RDW 14.0 02/24/2012 0325   LYMPHSABS 2.1 08/09/2022 2323   LYMPHSABS 2.0 02/24/2012 0325   MONOABS 2.0 (H) 08/09/2022 2323   MONOABS 0.7 02/24/2012 0325   EOSABS 0.0 08/09/2022 2323   EOSABS 0.1 02/24/2012 0325   BASOSABS 0.1 08/09/2022 2323   BASOSABS 0.1 02/24/2012 0325    BMET    Component Value Date/Time   NA 136 08/10/2022 0545   NA 143 02/24/2012 0325   K 3.6 08/10/2022 0545   K 4.1 02/24/2012 0325   CL 104 08/10/2022 0545   CL 108 (H) 02/24/2012 0325   CO2 23 08/10/2022 0545   CO2 26 02/24/2012 0325   GLUCOSE 92 08/10/2022 0545   GLUCOSE 120 (H) 02/24/2012 0325   BUN 12 08/10/2022 0545   BUN 25 (H) 02/24/2012 0325   CREATININE 1.35 (H) 08/10/2022 0545   CREATININE 1.47  (H) 02/24/2012 0325   CALCIUM 8.1 (L) 08/10/2022 0545   CALCIUM 9.1 02/24/2012 0325   GFRNONAA 53 (L) 08/10/2022 0545   GFRNONAA 48 (L) 02/24/2012 0325   GFRAA 55 (L) 02/24/2012 0325    COAGS: Lab Results  Component Value Date   INR 4.5 (HH) 08/10/2022   INR 1.3 (H) 03/11/2021   INR 1.4 (H) 03/09/2021     Non-Invasive Vascular Imaging:   Date of Surgery: 07/24/2022   Surgeon(s):DEW,JASON     Assistants:none   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  Ultrasound guidance for vascular access left posterior tibial artery             2.  Catheter placement into left popliteal artery from left posterior tibial approach             3.  Selective left lower extremity angiogram             4.  Percutaneous transluminal angioplasty of left posterior tibial artery with 2.5 and 3 mm diameter angioplasty balloons in both the proximal and the mid segment and then with a 4 mm diameter Lutonix drug-coated angioplasty balloon in the proximal segment             5.  TR band placement left posterior tibial artery access site   EBL: 3 cc   Contrast: 20 cc   Fluoro Time: 2.5 minutes   Moderate Conscious Sedation Time: approximately 22 minutes using 1 mg of Versed and 50 mcg of Fentanyl              Indications:  Patient is a 81 y.o.male with gangrenous changes to the left foot. The patient has previously had peroneal and popliteal intervention from a right femoral approach but we are unable to treat the left posterior tibial artery from a right-sided approach and he is brought back for treatment from a pedal access.  The patient is brought in for angiography for further evaluation and  potential treatment.  Due to the limb threatening nature of the situation, angiogram was performed for attempted limb salvage. The patient is aware that if the procedure fails, amputation would be expected.  The  patient also understands that even with successful revascularization, amputation may still be required due to the severity of the situation.  Risks and benefits are discussed and informed consent is obtained.    Procedure:  The patient was identified and appropriate procedural time out was performed.  The patient was then placed supine on the table and prepped and draped in the usual sterile fashion. Moderate conscious sedation was administered during a face to face encounter with the patient throughout the procedure with my supervision of the RN administering medicines and monitoring the patient's vital signs, pulse oximetry, telemetry and mental status throughout from the start of the procedure until the patient was taken to the recovery room. Ultrasound was used to evaluate the left posterior tibial artery.  It was patent .  A digital ultrasound image was acquired.  A micropuncture needle was used to access the left posterior tibial artery artery under direct ultrasound guidance and a permanent image was performed.  A micropuncture wire and sheath were then placed.  A 0.035 Advantage was advanced without resistance and a 5Fr sheath was placed.  Selective right lower extremity angiogram was then performed. This demonstrated the previous popliteal intervention was patent.  The peroneal artery was small and occluded in the mid to distal segment.  The posterior tibial artery had a subtotal occlusion at the origin and another area of very high-grade stenosis in the midsegment but was then continuous to the foot.. It was felt that it was in the patient's best interest to proceed with intervention after these images to avoid a second procedure and a larger amount of contrast and fluoroscopy based off of the findings from the initial angiogram. The patient was systemically heparinized. I then used a Kumpe catheter and the advantage wire to cross the diseased posterior tibial artery and parked the wire up into the SFA.   After exchanging for a 0.018 wire with a Kumpe catheter and confirming intraluminal flow in the popliteal artery, I proceeded with treatment.  A 2-1/2 and then a 3 mm diameter angioplasty balloon was used to treat the 2 areas of posterior tibial artery stenosis with inflations up to 10 to 12 atm for 1 minute in both locations.  For the more proximal lesion, a 4 mm diameter by 8 cm length Lutonix drug-coated angioplasty balloon was inflated to 6 atm for 1 minute to treat this area as well.  Completion imaging showed a marked improvement with about a 20% residual stenosis in the proximal segment and a 30% residual stenosis in the midsegment. I elected to terminate the procedure. The sheath was removed and a TR band was placed at the access site with excellent hemostatic result. The patient was taken to the recovery room in stable condition having tolerated the procedure well.   Findings:                         Left Lower Extremity: Previous popliteal intervention was patent.  The peroneal artery was small and occluded in the mid to distal segment.  The posterior tibial artery had a subtotal occlusion at the origin and another area of very high-grade stenosis in the midsegment but was then continuous to the foot.    Statin:  Yes.   Beta Blocker:  Yes.   Aspirin:  Yes.   ACEI:  Yes.   ARB:  No. CCB use:  No Other antiplatelets/anticoagulants:  Yes.   Xarelto 20 mg Daily   ASSESSMENT/PLAN: This is a 82 y.o. male presents to [facility] emergency department with generalized weakness and multiple days of anorexia.  Upon emergency room workup patient was found to be septic.  It was also noted that the patient had open sores and gangrenous toes to his left lower extremity.  Vascular surgery was consulted.  Plan: Patient be taken the vascular lab later today for left lower extremity angiogram.  His last angiogram was done on July 24, 2022 by Dr. Festus Barren MD.  At that point time he had balloon angioplasty to his  posttibial arteries to restore circulation.  She seems to continue to have difficulty with blood flow to his foot.  [name] is at the bedside.  I discussed in detail with both the patient and his wife the procedure, benefits, complications, risks.  Both verbalized her understanding and would like to proceed as soon as possible.  All questions were answered.  Patient is made n.p.o. and has been n.p.o. since midnight.   -I discussed the plan in detail with Dr. Festus Barren MD and he agrees with the plan.   Marcie Bal [facility] 08/10/2022 7:23 AM

## 2022-08-10 NOTE — Progress Notes (Signed)
Patients angiogram was cancelled for today due to an elevated INR of 4.5 Patients Xarelto has been placed on hold. Vascular Surgery will reschedule angiogram of left lower extremity after INR has been corrected and returns to 1.0

## 2022-08-10 NOTE — ED Notes (Signed)
Lab called requesting new green top be sent. RN to recollect.

## 2022-08-10 NOTE — Sepsis Progress Note (Addendum)
Notified provider of need to order repeat lactic acid.  Verbal order given per Dr. Andrez Grime to order repeat lactic 3 hours after last was collected.  Order entered.

## 2022-08-10 NOTE — Inpatient Diabetes Management (Signed)
Inpatient Diabetes Program Recommendations  AACE/ADA: New Consensus Statement on Inpatient Glycemic Control (2015)  Target Ranges:  Prepandial:   less than 140 mg/dL      Peak postprandial:   less than 180 mg/dL (1-2 hours)      Critically ill patients:  140 - 180 mg/dL   Lab Results  Component Value Date   GLUCAP 66 (L) 08/10/2022   HGBA1C 5.0 03/10/2021    Review of Glycemic Control  Diabetes history: DM 2 Outpatient Diabetes medications: Metformin 1000 mg bid Current orders for Inpatient glycemic control:  Novolog 0-15 units Q4 hours  Inpatient Diabetes Program Recommendations:    Hypoglycemia without insulin being given. Elevated renal function  -  Decrease Novolog Correction scale to 0-6 units tid + hs scale  Thanks,  Christena Deem RN, MSN, BC-ADM Inpatient Diabetes Coordinator Team Pager [phone] (8a-5p)

## 2022-08-10 NOTE — ED Notes (Signed)
Patient repositioning self in bed as needed/wanted with minimal assistance from staff. Pt spouse remains at bedside.

## 2022-08-10 NOTE — Progress Notes (Signed)
CODE SEPSIS - PHARMACY COMMUNICATION  **Broad Spectrum Antibiotics should be administered within 1 hour of Sepsis diagnosis**  Time Code Sepsis Called/Page Received: 0006  Antibiotics Ordered: Cefepime, Flagyl, Vancomycin  Time of 1st antibiotic administration: 2595  Otelia Sergeant, PharmD, [facility] 08/10/2022 1:04 AM

## 2022-08-10 NOTE — H&P (Signed)
[place]   PATIENT NAME: [name] [name]    MR#:  [id number]  DATE OF BIRTH:  12/24/1940  DATE OF ADMISSION:  08/09/2022  PRIMARY CARE PHYSICIAN: Luciana Axe, NP   Patient is coming from: Home  REQUESTING/REFERRING PHYSICIAN: Chiquita Loth, MD  CHIEF COMPLAINT:   Chief Complaint  Patient presents with   Fatigue        Hypotension    HISTORY OF PRESENT ILLNESS:  [name] [name] is a 82 y.o. African-American male with medical history significant for CHF, type 2 diabetes mellitus, hypertension and pancreatitis, who presented to the emergency room with acute onset of generalized weakness with significant anorexia over the last couple of days.  He has been having dysuria and diminished urinary output.  No hematuria or flank pain.  He denies any fever or chills.  No chest pain or dyspnea or cough.  Has been having occasional wheezing.  No melena or bright red being per rectum.  No other bleeding diathesis.  ED Course: When he came to the ER BP was 98/63 and later 90/70 with otherwise unremarkable vital signs.  Labs revealed mild hyponatremia 132 and glucose 179 with a creatinine 1.70, calcium 8.6 and albumin 2.6 with total bili 2 and high sensitive troponin I 9 and later 10.  Lactic acid was 3.5 and later 3.7 then 1.7.  CBC showed leukocytosis of 25.6 with neutrophilia as well as anemia.  INR was 4.5 and PTT 42.5.  Digoxin level 0.5.  Respiratory panel came back negative.  UA showed 6-10 WBCs and more than 50 RBCs EKG as reviewed by me : EKG showed atrial fibrillation with controlled ventricular response of 67 with low voltage QRS and borderline repolarization abnormality with prolonged QT interval with QTc of 519 MS. Imaging: Portable chest ray showed cardiomegaly with no acute cardiopulmonary disease.  Noncontrasted CT scan revealed no acute intracranial abnormalities.  The patient was given 3 L bolus of IV lactated Ringer and 1 L IV normal saline, IV vancomycin, Flagyl and  cefepime.  He will be admitted to a medical telemetry bed for further evaluation and management. PAST MEDICAL HISTORY:   Past Medical History:  Diagnosis Date   CHF (congestive heart failure)    Diabetes    Dysrhythmia    AFIB   HTN (hypertension)    Infection    LEG   Pancreatitis     PAST SURGICAL HISTORY:   Past Surgical History:  Procedure Laterality Date   AMPUTATION Right 03/09/2021   Procedure: AMPUTATION FOOT;  Surgeon: Annice Needy, MD;  Location: [facility];  Service: General;  Laterality: Right;   AMPUTATION Right 03/11/2021   Procedure: AMPUTATION ABOVE KNEE;  Surgeon: Annice Needy, MD;  Location: [facility];  Service: General;  Laterality: Right;   CATARACT EXTRACTION W/PHACO Right 03/08/2017   Procedure: CATARACT EXTRACTION PHACO AND INTRAOCULAR LENS PLACEMENT (IOC);  Surgeon: Nevada Crane, MD;  Location: [facility];  Service: Ophthalmology;  Laterality: Right;  Lot# [id number] H [place]: 00:30.9 AP%: 8.2 CDE: 2.54   CATARACT EXTRACTION W/PHACO Left 04/04/2017   Procedure: CATARACT EXTRACTION PHACO AND INTRAOCULAR LENS PLACEMENT (IOC);  Surgeon: Nevada Crane, MD;  Location: [facility];  Service: Ophthalmology;  Laterality: Left;  [place] 00:31.4 AP% 12.0 CDE 3.78 Fluid Pack lot # [id number]   CHOLECYSTECTOMY     EYE SURGERY     right eye   LOWER EXTREMITY ANGIOGRAPHY Right 02/10/2021   Procedure: LOWER EXTREMITY ANGIOGRAPHY;  Surgeon: Festus Barren  S, MD;  Location: [facility];  Service: Cardiovascular;  Laterality: Right;   LOWER EXTREMITY ANGIOGRAPHY Left 07/03/2022   Procedure: Lower Extremity Angiography;  Surgeon: Annice Needy, MD;  Location: [facility];  Service: Cardiovascular;  Laterality: Left;   LOWER EXTREMITY ANGIOGRAPHY Left 07/24/2022   Procedure: Lower Extremity Angiography;  Surgeon: Annice Needy, MD;  Location: [facility];  Service: Cardiovascular;  Laterality: Left;    SOCIAL HISTORY:   Social History   Tobacco Use   Smoking  status: Never   Smokeless tobacco: Current    Types: Chew  Substance Use Topics   Alcohol use: No    Alcohol/week: 0.0 standard drinks of alcohol    FAMILY HISTORY:  History reviewed. No pertinent family history.  DRUG ALLERGIES:  No Known Allergies  REVIEW OF SYSTEMS:   ROS As per history of present illness. All pertinent systems were reviewed above. Constitutional, HEENT, cardiovascular, respiratory, GI, GU, musculoskeletal, neuro, psychiatric, endocrine, integumentary and hematologic systems were reviewed and are otherwise negative/unremarkable except for positive findings mentioned above in the HPI.   MEDICATIONS AT HOME:   Prior to Admission medications   Medication Sig Start Date End Date Taking? Authorizing Provider  ascorbic acid (VITAMIN C) 500 MG tablet Take 500 mg by mouth daily. 10/06/21   [provider]  aspirin EC 81 MG tablet Take 1 tablet (81 mg total) by mouth daily. Swallow whole. 07/03/22 07/03/23  Annice Needy, MD  atorvastatin (LIPITOR) 10 MG tablet Take 1 tablet (10 mg total) by mouth daily. 02/10/21 06/06/22  Annice Needy, MD  carvedilol (COREG) 6.25 MG tablet Take 6.25 mg 2 (two) times daily by mouth. 12/19/16   [provider]  digoxin (LANOXIN) 0.125 MG tablet Take 0.125 mcg daily by mouth. 12/19/16   [provider]  ferrous gluconate (FERGON) 324 MG tablet Take 1 tablet (324 mg total) by mouth 2 (two) times daily with a meal. 03/16/21 06/06/22  Charise Killian, MD  ferrous sulfate 325 (65 FE) MG tablet Take by mouth. 05/11/21 06/06/22  [provider]  furosemide (LASIX) 20 MG tablet Take 20 mg daily by mouth. 12/19/16   [provider]  furosemide (LASIX) 20 MG tablet Take 1 tablet by mouth daily. 10/05/21   [provider]  gabapentin (NEURONTIN) 100 MG capsule Take 1 capsule (100 mg total) by mouth 3 (three) times daily. 03/16/21 06/06/22  Charise Killian, MD  lisinopril (PRINIVIL,ZESTRIL) 10 MG tablet  Take 15 mg by mouth daily.    [provider]  metFORMIN (GLUCOPHAGE) 1000 MG tablet Take 1,000 mg by mouth 2 (two) times daily with a meal.    [provider]  rivaroxaban (XARELTO) 20 MG TABS tablet Take 20 mg daily by mouth.    [provider]  SSD 1 % cream Apply topically. Patient not taking: Reported on 07/03/2022 07/12/21   [provider]  vitamin B-12 (CYANOCOBALAMIN) 1000 MCG tablet Take 1,000 mcg by mouth daily.    [provider]      VITAL SIGNS:  Blood pressure 111/64, pulse 67, temperature 98 F (36.7 C), temperature source Oral, resp. rate 11, height 6' (1.829 m), weight 89.1 kg, SpO2 99 %.  PHYSICAL EXAMINATION:  Physical Exam  GENERAL:  82 y.o.-year-old African making male patient lying in the bed with no acute distress.  EYES: Pupils equal, round, reactive to light and accommodation. No scleral icterus. Extraocular muscles intact.  HEENT: Head  atraumatic, normocephalic. Oropharynx and nasopharynx clear.  NECK:  Supple, no jugular venous distention. No thyroid enlargement, no tenderness.  LUNGS: Normal breath sounds bilaterally, no wheezing, rales,rhonchi or crepitation. No use of accessory muscles of respiration.  CARDIOVASCULAR: Regular rate and rhythm, S1, S2 normal. No murmurs, rubs, or gallops.  ABDOMEN: Soft, nondistended, nontender. Bowel sounds present. No organomegaly or mass.  EXTREMITIES: No pedal edema, cyanosis, or clubbing.  NEUROLOGIC: Cranial nerves II through XII are intact. Muscle strength 5/5 in all extremities. Sensation intact. Gait not checked.  PSYCHIATRIC: The patient is alert and oriented x 3.  Normal affect and good eye contact. SKIN: Superficial clean ulceration of the left lower leg and black discoloration of the second toe with skin maceration in the first webspace without significant tenderness however with offensive odor.Marland Kitchen   LABORATORY PANEL:   CBC Recent Labs  Lab 08/10/22 0418  WBC 22.3*   HGB 9.6*  HCT 28.0*  PLT 176   ------------------------------------------------------------------------------------------------------------------  Chemistries  Recent Labs  Lab 08/09/22 2323 08/10/22 0545  NA 132* 136  K 4.5 3.6  CL 98 104  CO2 23 23  GLUCOSE 179* 92  BUN 13 12  CREATININE 1.72* 1.35*  CALCIUM 8.6* 8.1*  AST 29  --   ALT 10  --   ALKPHOS 87  --   BILITOT 2.0*  --    ------------------------------------------------------------------------------------------------------------------  Cardiac Enzymes No results for input(s): "TROPONINI" in the last 168 hours. ------------------------------------------------------------------------------------------------------------------  RADIOLOGY:  CT Head Wo Contrast  Result Date: 08/10/2022 CLINICAL DATA:  Not eating well for 2 days. Lethargy and hypotension. EXAM: CT HEAD WITHOUT CONTRAST TECHNIQUE: Contiguous axial images were obtained from the base of the skull through the vertex without intravenous contrast. RADIATION DOSE REDUCTION: This exam was performed according to the departmental dose-optimization program which includes automated exposure control, adjustment of the mA and/or kV according to patient size and/or use of iterative reconstruction technique. COMPARISON:  CT head 02/24/2012 FINDINGS: Brain: No intracranial hemorrhage, mass effect, or evidence of acute infarct. No hydrocephalus. No extra-axial fluid collection. Generalized cerebral atrophy. Ill-defined hypoattenuation within the cerebral white matter is nonspecific but consistent with chronic small vessel ischemic disease. 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 Physiologic basal ganglia calcifications. Vascular: No hyperdense vessel. Intracranial arterial calcification. Skull: No fracture or focal lesion. Sinuses/Orbits: No acute finding. Paranasal sinuses and mastoid air cells are well aerated. Other: None. IMPRESSION: 1. No evidence of acute  intracranial abnormality. Electronically Signed   By: Minerva Fester M.D.   On: 08/10/2022 00:14   DG Chest Port 1 View  Result Date: 08/09/2022 CLINICAL DATA:  Weakness EXAM: PORTABLE CHEST 1 VIEW COMPARISON:  Radiographs 03/08/2021 FINDINGS: Stable cardiomegaly Aortic atherosclerotic calcification. No focal consolidation, pleural effusion, or pneumothorax. No displaced rib fractures. IMPRESSION: 1. No active disease. 2. Cardiomegaly. Electronically Signed   By: Minerva Fester M.D.   On: 08/09/2022 23:31      IMPRESSION AND PLAN:  Assessment and Plan: * Sepsis due to undetermined organism - The patient will be admitted to a medical telemetry bed. - He has associated generalized weakness and anorexia. - We will continue him on IV vancomycin, cefepime and Flagyl for likely sources of cellulitis, gangrene and possible osteomyelitis of the left second toe. - Pain management will be provided. - He will be hydrated with IV normal saline.   Cellulitis of second toe of left foot - This is likely associated with left second toe gangrene. - We will need to  rule out osteomyelitis. - Management as above. - Vascular surgery consult and podiatry consult will be obtained. - I notified Dr. Wyn Quaker and Dr. Excell Seltzer about the patient.  Essential hypertension - We will continue antihypertensives while monitoring improving renal functions.  Dyslipidemia - We will continue statin therapy.  Type 2 diabetes mellitus with peripheral neuropathy - The patient will be placed on supplemental coverage with NovoLog. - We will hold off metformin. - We will continue Neurontin.   DVT prophylaxis: Lovenox.  Advanced Care Planning:  Code Status: full code.  Family Communication:  The plan of care was discussed in details with the patient (and family). I answered all questions. The patient agreed to proceed with the above mentioned plan. Further management will depend upon hospital course. Disposition Plan: Back to  previous home environment Consults called: Vascular surgery and podiatry All the records are reviewed and case discussed with ED provider.  Status is: Inpatient    At the time of the admission, it appears that the appropriate admission status for this patient is inpatient.  This is judged to be reasonable and necessary in order to provide the required intensity of service to ensure the patient's safety given the presenting symptoms, physical exam findings and initial radiographic and laboratory data in the context of comorbid conditions.  The patient requires inpatient status due to high intensity of service, high risk of further deterioration and high frequency of surveillance required.  I certify that at the time of admission, it is my clinical judgment that the patient will require inpatient hospital care extending more than 2 midnights.                            Dispo: The patient is from: Home              Anticipated d/c is to: Home              Patient currently is not medically stable to d/c.              Difficult to place patient: No  Hannah Beat M.D on 08/10/2022 at 6:47 AM  [facility]   From 7 PM-7 AM, contact night-coverage [email or web address]  CC: Primary care physician; Luciana Axe, NP

## 2022-08-10 NOTE — Assessment & Plan Note (Signed)
-   We will continue statin therapy. 

## 2022-08-10 NOTE — Assessment & Plan Note (Addendum)
-   The patient will be admitted to a medical telemetry bed. - He has associated generalized weakness and anorexia. - We will continue him on IV vancomycin, cefepime and Flagyl for likely sources of cellulitis, gangrene and possible osteomyelitis of the left second toe. - Pain management will be provided. - He will be hydrated with IV normal saline.

## 2022-08-10 NOTE — ED Notes (Signed)
New purple top sent per request from lab

## 2022-08-10 NOTE — Assessment & Plan Note (Signed)
-   This is likely associated with left second toe gangrene. - We will need to rule out osteomyelitis. - Management as above. - Vascular surgery consult and podiatry consult will be obtained. - I notified Dr. Wyn Quaker and Dr. Excell Seltzer about the patient.

## 2022-08-10 NOTE — Consult Note (Signed)
ORTHOPAEDIC CONSULTATION  REQUESTING PHYSICIAN: Enedina Finner, MD  Chief Complaint: Left second toe gangrene  HPI: [name] [name] is a 82 y.o. male who complains of worsening gangrenous changes to his left foot and second toe.  Admitted with gangrene to his second toe.  Patient has been seen by vascular surgery.  Attempted revascularization was performed last month.  They are planning on angio once his INR comes down.  It was too elevated today to have angio performed.  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    Past Medical History:  Diagnosis Date   CHF (congestive heart failure)    Diabetes    Dysrhythmia    AFIB   HTN (hypertension)    Infection    LEG   Pancreatitis    Past Surgical History:  Procedure Laterality Date   AMPUTATION Right 03/09/2021   Procedure: AMPUTATION FOOT;  Surgeon: Annice Needy, MD;  Location: [facility];  Service: General;  Laterality: Right;   AMPUTATION Right 03/11/2021   Procedure: AMPUTATION ABOVE KNEE;  Surgeon: Annice Needy, MD;  Location: [facility];  Service: General;  Laterality: Right;   CATARACT EXTRACTION W/PHACO Right 03/08/2017   Procedure: CATARACT EXTRACTION PHACO AND INTRAOCULAR LENS PLACEMENT (IOC);  Surgeon: Nevada Crane, MD;  Location: [facility];  Service: Ophthalmology;  Laterality: Right;  Lot# [id number] H [place]: 00:30.9 AP%: 8.2 CDE: 2.54   CATARACT EXTRACTION W/PHACO Left 04/04/2017   Procedure: CATARACT EXTRACTION PHACO AND INTRAOCULAR LENS PLACEMENT (IOC);  Surgeon: Nevada Crane, MD;  Location: [facility];  Service: Ophthalmology;  Laterality: Left;  [place] 00:31.4 AP% 12.0 CDE 3.78 Fluid Pack lot # [id number]   CHOLECYSTECTOMY     EYE SURGERY     right eye   LOWER EXTREMITY ANGIOGRAPHY Right 02/10/2021   Procedure: LOWER EXTREMITY ANGIOGRAPHY;  Surgeon: Annice Needy, MD;  Location: [facility];  Service: Cardiovascular;  Laterality: Right;   LOWER EXTREMITY ANGIOGRAPHY Left 07/03/2022    Procedure: Lower Extremity Angiography;  Surgeon: Annice Needy, MD;  Location: [facility];  Service: Cardiovascular;  Laterality: Left;   LOWER EXTREMITY ANGIOGRAPHY Left 07/24/2022   Procedure: Lower Extremity Angiography;  Surgeon: Annice Needy, MD;  Location: [facility];  Service: Cardiovascular;  Laterality: Left;   Social History   Socioeconomic History   Marital status: Married    Spouse name: Not on file   Number of children: Not on file   Years of education: Not on file   Highest education level: Not on file  Occupational History   Not on file  Tobacco Use   Smoking status: Never   Smokeless tobacco: Current    Types: Chew  Substance and Sexual Activity   Alcohol use: No    Alcohol/week: 0.0 standard drinks of alcohol   Drug use: No   Sexual activity: Not on file  Other Topics Concern   Not on file  Social History Narrative   Not on file   Social Determinants of Health   Financial Resource Strain: Not on file  Food Insecurity: No Food Insecurity (08/10/2022)   Hunger Vital Sign    Worried About Running Out of Food in the Last Year: Never true    Ran Out of Food in the Last Year: Never true  Transportation Needs: No Transportation Needs (08/10/2022)   PRAPARE - Administrator, Civil Service (Medical): No    Lack of Transportation (Non-Medical): No  Physical Activity:  Not on file  Stress: Not on file  Social Connections: Not on file   History reviewed. No pertinent family history. No Known Allergies Prior to Admission medications   Medication Sig Start Date End Date Taking? Authorizing Provider  ascorbic acid (VITAMIN C) 500 MG tablet Take 500 mg by mouth daily. 10/06/21   [provider]  aspirin EC 81 MG tablet Take 1 tablet (81 mg total) by mouth daily. Swallow whole. 07/03/22 07/03/23  Annice Needy, MD  atorvastatin (LIPITOR) 10 MG tablet Take 1 tablet (10 mg total) by mouth daily. 02/10/21 06/06/22  Annice Needy, MD   carvedilol (COREG) 6.25 MG tablet Take 6.25 mg 2 (two) times daily by mouth. 12/19/16   [provider]  digoxin (LANOXIN) 0.125 MG tablet Take 0.125 mcg daily by mouth. 12/19/16   [provider]  ferrous gluconate (FERGON) 324 MG tablet Take 1 tablet (324 mg total) by mouth 2 (two) times daily with a meal. 03/16/21 06/06/22  Charise Killian, MD  ferrous sulfate 325 (65 FE) MG tablet Take by mouth. 05/11/21 06/06/22  [provider]  furosemide (LASIX) 20 MG tablet Take 20 mg daily by mouth. 12/19/16   [provider]  furosemide (LASIX) 20 MG tablet Take 1 tablet by mouth daily. 10/05/21   [provider]  gabapentin (NEURONTIN) 100 MG capsule Take 1 capsule (100 mg total) by mouth 3 (three) times daily. 03/16/21 06/06/22  Charise Killian, MD  lisinopril (PRINIVIL,ZESTRIL) 10 MG tablet Take 15 mg by mouth daily.    [provider]  metFORMIN (GLUCOPHAGE) 1000 MG tablet Take 1,000 mg by mouth 2 (two) times daily with a meal.    [provider]  rivaroxaban (XARELTO) 20 MG TABS tablet Take 20 mg daily by mouth.    [provider]  SSD 1 % cream Apply topically. Patient not taking: Reported on 07/03/2022 07/12/21   [provider]  vitamin B-12 (CYANOCOBALAMIN) 1000 MCG tablet Take 1,000 mcg by mouth daily.    [provider]   DG Foot 2 Views Left  Result Date: 08/10/2022 CLINICAL DATA:  [id number] with sepsis due to cellulitis. EXAM: LEFT FOOT - 2 VIEW COMPARISON:  None Available. FINDINGS: There is mild diffuse edema. There are vascular calcifications in the distal foreleg and foot, with extension into the great toe. No soft tissue gas or radiopaque foreign body are seen. Normal bone mineralization. There is a well-marginated erosive lesion in the medial aspect of the distal first metatarsal measuring 1.5 cm across and at least 5 mm in depth. There is a thin sclerotic border and narrow zone of transition to the  underlying normal bone. There are no visible calcifications in the erosion. There are no other erosive lesions and no aggressive destructive changes. Evidence of fractures is not seen. There are mild features of degenerative arthrosis of the first MTP joint and a small plantar calcaneal spur. IMPRESSION: 1. Mild diffuse edema. No soft tissue gas or radiopaque foreign body. 2. Well-marginated erosive lesion in the medial aspect of the distal first metatarsal, without visible calcifications. This is nonspecific but could be due to gout or chronic osteomyelitis. 3. 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 4. Vascular calcifications in the distal foreleg and foot. Electronically Signed   By: Almira Bar M.D.   On: 08/10/2022 07:21   CT Head Wo Contrast  Result Date: 08/10/2022 CLINICAL DATA:  Not eating well for 2 days. Lethargy and hypotension. EXAM: CT  HEAD WITHOUT CONTRAST TECHNIQUE: Contiguous axial images were obtained from the base of the skull through the vertex without intravenous contrast. RADIATION DOSE REDUCTION: This exam was performed according to the departmental dose-optimization program which includes automated exposure control, adjustment of the mA and/or kV according to patient size and/or use of iterative reconstruction technique. COMPARISON:  CT head 02/24/2012 FINDINGS: Brain: No intracranial hemorrhage, mass effect, or evidence of acute infarct. No hydrocephalus. No extra-axial fluid collection. Generalized cerebral atrophy. Ill-defined hypoattenuation within the cerebral white matter is nonspecific but consistent with chronic small vessel ischemic disease. 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 Physiologic basal ganglia calcifications. Vascular: No hyperdense vessel. Intracranial arterial calcification. Skull: No fracture or focal lesion. Sinuses/Orbits: No acute finding. Paranasal sinuses and mastoid air cells are well aerated. Other: None. IMPRESSION: 1. No  evidence of acute intracranial abnormality. Electronically Signed   By: Minerva Fester M.D.   On: 08/10/2022 00:14   DG Chest Port 1 View  Result Date: 08/09/2022 CLINICAL DATA:  Weakness EXAM: PORTABLE CHEST 1 VIEW COMPARISON:  Radiographs 03/08/2021 FINDINGS: Stable cardiomegaly Aortic atherosclerotic calcification. No focal consolidation, pleural effusion, or pneumothorax. No displaced rib fractures. IMPRESSION: 1. No active disease. 2. Cardiomegaly. Electronically Signed   By: Minerva Fester M.D.   On: 08/09/2022 23:31    Positive ROS: All other systems have been reviewed and were otherwise negative with the exception of those mentioned in the HPI and as above.  12 point ROS was performed.  Physical Exam: General: Alert and oriented.  No apparent distress.  Vascular:  Left foot:Dorsalis Pedis:  absent Posterior Tibial:  absent  Right foot:  Status post right AKA  Neuro:absent protective sensation  Derm: Gangrene to the left second toe to the level of the MTPJ.  Worsening darkened discoloration from the midfoot distally.  Plantarly into the arch there is darkened discoloration as well.  Ortho/MS: Status post right-sided AKA.  Diffuse edema to the left foot and ankle.  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  He will definitely need attempted revascularization to the lower extremity for any attempts at limb salvage.  If he has any residual circulation limited amputation of the second toe would be preferable but if he only has residual circulation from the midfoot proximal would recommend transmetatarsal amputation.  Still concerned about attempts at limb salvage given the darkened discoloration to the medial arch and dorsal foot.  We discussed he is at high risk of undergoing  amputation of the left lower extremity as well.  There is foul odor from the necrotic second toe but no need for MRI at this point.  No concern for deep abscess at this time.    Irean Hong, DPM Cell [phone]   08/10/2022 12:26 PM

## 2022-08-11 ENCOUNTER — Encounter
Admission: EM | Disposition: A | Discharge status: Skilled Nursing Facility | Payer: Self-pay | Source: Home / Self Care | Attending: Internal Medicine

## 2022-08-11 DIAGNOSIS — Z9889 Other specified postprocedural states: Secondary | ICD-10-CM | POA: Diagnosis not present

## 2022-08-11 DIAGNOSIS — I70262 Atherosclerosis of native arteries of extremities with gangrene, left leg: Secondary | ICD-10-CM | POA: Diagnosis not present

## 2022-08-11 DIAGNOSIS — E1142 Type 2 diabetes mellitus with diabetic polyneuropathy: Secondary | ICD-10-CM | POA: Diagnosis not present

## 2022-08-11 DIAGNOSIS — A419 Sepsis, unspecified organism: Secondary | ICD-10-CM | POA: Diagnosis not present

## 2022-08-11 DIAGNOSIS — I96 Gangrene, not elsewhere classified: Secondary | ICD-10-CM | POA: Diagnosis not present

## 2022-08-11 DIAGNOSIS — N179 Acute kidney failure, unspecified: Secondary | ICD-10-CM | POA: Diagnosis not present

## 2022-08-11 HISTORY — PX: LOWER EXTREMITY ANGIOGRAPHY: CATH118251

## 2022-08-11 LAB — BASIC METABOLIC PANEL
Anion gap: 8 (ref 5–15)
BUN: 11 mg/dL (ref 8–23)
CO2: 26 mmol/L (ref 22–32)
Calcium: 8.4 mg/dL — ABNORMAL LOW (ref 8.9–10.3)
Chloride: 101 mmol/L (ref 98–111)
Creatinine, Ser: 1.22 mg/dL (ref 0.61–1.24)
GFR, Estimated: 60 mL/min — ABNORMAL LOW (ref >60)
Glucose, Bld: 89 mg/dL (ref 70–99)
Potassium: 3.4 mmol/L — ABNORMAL LOW (ref 3.5–5.1)
Sodium: 135 mmol/L (ref 135–145)

## 2022-08-11 LAB — PROTIME-INR
INR: 1.6 — ABNORMAL HIGH (ref 0.8–1.2)
Prothrombin Time: 19.2 seconds — ABNORMAL HIGH (ref 11.4–15.2)

## 2022-08-11 LAB — URINE CULTURE

## 2022-08-11 LAB — CBC
HCT: 29.6 % — ABNORMAL LOW (ref 39.0–52.0)
Hemoglobin: 10.3 g/dL — ABNORMAL LOW (ref 13.0–17.0)
MCH: 28.8 pg (ref 26.0–34.0)
MCHC: 34.8 g/dL (ref 30.0–36.0)
MCV: 82.7 fL (ref 80.0–100.0)
Platelets: 210 10*3/uL (ref 150–400)
RBC: 3.58 MIL/uL — ABNORMAL LOW (ref 4.22–5.81)
RDW: 13.7 % (ref 11.5–15.5)
WBC: 25.1 10*3/uL — ABNORMAL HIGH (ref 4.0–10.5)
nRBC: 0 % (ref 0.0–0.2)

## 2022-08-11 LAB — GLUCOSE, CAPILLARY
Glucose-Capillary: 103 mg/dL — ABNORMAL HIGH (ref 70–99)
Glucose-Capillary: 138 mg/dL — ABNORMAL HIGH (ref 70–99)
Glucose-Capillary: 70 mg/dL (ref 70–99)
Glucose-Capillary: 73 mg/dL (ref 70–99)
Glucose-Capillary: 79 mg/dL (ref 70–99)
Glucose-Capillary: 84 mg/dL (ref 70–99)
Glucose-Capillary: 87 mg/dL (ref 70–99)

## 2022-08-11 LAB — APTT: aPTT: 58 seconds — ABNORMAL HIGH (ref 24–36)

## 2022-08-11 SURGERY — LOWER EXTREMITY ANGIOGRAPHY
Anesthesia: Moderate Sedation | Laterality: Left

## 2022-08-11 MED ORDER — HEPARIN SODIUM (PORCINE) 1000 UNIT/ML IJ SOLN
INTRAMUSCULAR | Status: AC
  Filled 2022-08-11: qty 10

## 2022-08-11 MED ORDER — CEFAZOLIN SODIUM-DEXTROSE 2-4 GM/100ML-% IV SOLN: 2.0000 g | INTRAVENOUS | Status: DC

## 2022-08-11 MED ORDER — ONDANSETRON HCL 4 MG/2ML IJ SOLN: 4.0000 mg | Freq: Four times a day (QID) | INTRAMUSCULAR | Status: DC | PRN

## 2022-08-11 MED ORDER — FENTANYL CITRATE (PF) 100 MCG/2ML IJ SOLN
INTRAMUSCULAR | Status: AC
  Filled 2022-08-11: qty 2

## 2022-08-11 MED ORDER — MIDAZOLAM HCL 2 MG/2ML IJ SOLN
INTRAMUSCULAR | Status: DC | PRN
  Administered 2022-08-11: 1 mg via INTRAVENOUS

## 2022-08-11 MED ORDER — FENTANYL CITRATE (PF) 100 MCG/2ML IJ SOLN
INTRAMUSCULAR | Status: DC | PRN
  Administered 2022-08-11: 50 ug via INTRAVENOUS

## 2022-08-11 MED ORDER — FAMOTIDINE 20 MG PO TABS: 40.0000 mg | ORAL_TABLET | Freq: Once | ORAL | Status: DC | PRN

## 2022-08-11 MED ORDER — METHYLPREDNISOLONE SODIUM SUCC 125 MG IJ SOLR: 125.0000 mg | Freq: Once | INTRAMUSCULAR | Status: DC | PRN

## 2022-08-11 MED ORDER — DIPHENHYDRAMINE HCL 50 MG/ML IJ SOLN: 50.0000 mg | Freq: Once | INTRAMUSCULAR | Status: DC | PRN

## 2022-08-11 MED ORDER — IODIXANOL 320 MG/ML IV SOLN
INTRAVENOUS | Status: DC | PRN
  Administered 2022-08-11: 20 mL via INTRA_ARTERIAL

## 2022-08-11 MED ORDER — SODIUM CHLORIDE 0.9 % IV SOLN: INTRAVENOUS | Status: DC

## 2022-08-11 MED ORDER — HYDROMORPHONE HCL 1 MG/ML IJ SOLN: 1.0000 mg | Freq: Once | INTRAMUSCULAR | Status: DC | PRN

## 2022-08-11 MED ORDER — FENTANYL CITRATE PF 50 MCG/ML IJ SOSY: 12.5000 ug | PREFILLED_SYRINGE | Freq: Once | INTRAMUSCULAR | Status: DC | PRN

## 2022-08-11 MED ORDER — DEXTROSE-NACL 5-0.9 % IV SOLN: INTRAVENOUS | Status: DC

## 2022-08-11 MED ORDER — MIDAZOLAM HCL 5 MG/5ML IJ SOLN
INTRAMUSCULAR | Status: AC
  Filled 2022-08-11: qty 5

## 2022-08-11 MED ORDER — CHLORHEXIDINE GLUCONATE CLOTH 2 % EX PADS
6.0000 | MEDICATED_PAD | Freq: Once | CUTANEOUS | Status: DC
  Administered 2022-08-11: 6 via TOPICAL

## 2022-08-11 MED ORDER — MIDAZOLAM HCL 2 MG/ML PO SYRP: 8.0000 mg | ORAL_SOLUTION | Freq: Once | ORAL | Status: DC | PRN

## 2022-08-11 MED ORDER — CEFAZOLIN SODIUM-DEXTROSE 2-4 GM/100ML-% IV SOLN
INTRAVENOUS | Status: AC
  Filled 2022-08-11: qty 100

## 2022-08-11 SURGICAL SUPPLY — 8 items
CATH ANGIO 5F PIGTAIL 65CM (CATHETERS) IMPLANT
COVER PROBE ULTRASOUND 5X96 (MISCELLANEOUS) IMPLANT
DEVICE STARCLOSE SE CLOSURE (Vascular Products) IMPLANT
PACK ANGIOGRAPHY (CUSTOM PROCEDURE TRAY) ×1 IMPLANT
SHEATH BRITE TIP 5FRX11 (SHEATH) IMPLANT
SYR MEDRAD MARK 7 150ML (SYRINGE) IMPLANT
TUBING CONTRAST HIGH PRESS 72 (TUBING) IMPLANT
WIRE GUIDERIGHT .035X150 (WIRE) IMPLANT

## 2022-08-11 NOTE — Progress Notes (Signed)
Progress Note    08/11/2022 10:46 AM * No surgery date entered *  Subjective:  This is a 81 y.o. male  African-American male with medical history significant for CHF, type 2 diabetes mellitus, hypertension and pancreatitis, who presented to the emergency room with acute onset of generalized weakness with significant anorexia over the last couple of days.  He has been having dysuria and diminished urinary output.  No hematuria or flank pain.  He denies any fever or chills.  No chest pain or dyspnea or cough.  Has been having occasional wheezing.  No melena or bright red being per rectum.  No other bleeding diathesis.    Vascular surgery was consulted for left lower extremity gangrene toes and open sores. This patient is well know to vascular surgery. He was last seen July 24, 2022 and had an angiogram to the left lower extremity with angioplasty to the post tibial artery.   Today patient is resting comfortably in bed in his room.  I again discussed in detail the procedure, benefits, risks, complications.  Patient verbalizes understanding and wishes to proceed with the procedure.  Patient has been n.p.o. since midnight.  Patient does endorse pain to the left lower extremity at rest this morning.  Patient denies any chest pain or shortness of breath today.   Vitals:   08/11/22 0332 08/11/22 0857  BP: (!) 136/98 (!) 151/82  Pulse: 74 99  Resp: 20 (!) 22  Temp: 98.6 F (37 C) 98.2 F (36.8 C)  SpO2: 99% 98%   Physical Exam: Cardiac:  Irregular rate and rhythm, Hx of Afib.  Lungs:  Clear on auscultation,  Incisions:  None Extremities:  Right AKA, Left lower extremity  Abdomen:  Positive bowel sounds, soft, flat, non tender Neurologic: AAOX4, follows commands.   CBC    Component Value Date/Time   WBC 25.1 (H) 08/11/2022 0806   RBC 3.58 (L) 08/11/2022 0806   HGB 10.3 (L) 08/11/2022 0806   HGB 14.7 02/24/2012 0325   HCT 29.6 (L) 08/11/2022 0806   HCT 44.4 02/24/2012 0325   PLT 210  08/11/2022 0806   PLT 108 (L) 02/24/2012 0325   MCV 82.7 08/11/2022 0806   MCV 90 02/24/2012 0325   MCH 28.8 08/11/2022 0806   MCHC 34.8 08/11/2022 0806   RDW 13.7 08/11/2022 0806   RDW 14.0 02/24/2012 0325   LYMPHSABS 2.1 08/09/2022 2323   LYMPHSABS 2.0 02/24/2012 0325   MONOABS 2.0 (H) 08/09/2022 2323   MONOABS 0.7 02/24/2012 0325   EOSABS 0.0 08/09/2022 2323   EOSABS 0.1 02/24/2012 0325   BASOSABS 0.1 08/09/2022 2323   BASOSABS 0.1 02/24/2012 0325    BMET    Component Value Date/Time   NA 135 08/11/2022 0806   NA 143 02/24/2012 0325   K 3.4 (L) 08/11/2022 0806   K 4.1 02/24/2012 0325   CL 101 08/11/2022 0806   CL 108 (H) 02/24/2012 0325   CO2 26 08/11/2022 0806   CO2 26 02/24/2012 0325   GLUCOSE 89 08/11/2022 0806   GLUCOSE 120 (H) 02/24/2012 0325   BUN 11 08/11/2022 0806   BUN 25 (H) 02/24/2012 0325   CREATININE 1.22 08/11/2022 0806   CREATININE 1.47 (H) 02/24/2012 0325   CALCIUM 8.4 (L) 08/11/2022 0806   CALCIUM 9.1 02/24/2012 0325   GFRNONAA 60 (L) 08/11/2022 0806   GFRNONAA 48 (L) 02/24/2012 0325   GFRAA 55 (L) 02/24/2012 0325    INR    Component Value Date/Time   INR   1.6 (H) 08/11/2022 0806     Intake/Output Summary (Last 24 hours) at 08/11/2022 1046 Last data filed at 08/11/2022 0934 Gross per 24 hour  Intake 288.46 ml  Output 1100 ml  Net -811.54 ml     Assessment/Plan:  82 y.o. male with a long history of peripheral artery disease and right above-the-knee amputation.* No surgery date entered *   Plan: Patient will be taken to the vascular lab today for left lower extremity angiogram.  Patient's INR is down to 1.6 this morning.  Patient has been n.p.o. since midnight last night.  Again I discussed in detail the procedure, benefits, risks, and complications.  He verbalizes understanding.  I answered all his questions.  He would like to proceed as soon as possible.  DVT prophylaxis:  Lovenox 40 mg.  Discussed the plan in detail with Dr. Festus Barren  MD and he agrees with the plan   Marcie Bal [facility] 08/11/2022 10:46 AM

## 2022-08-11 NOTE — H&P (View-Only) (Signed)
Progress Note    08/11/2022 10:46 AM * No surgery date entered *  Subjective:  This is a 82 y.o. male  African-American male with medical history significant for CHF, type 2 diabetes mellitus, hypertension and pancreatitis, who presented to the emergency room with acute onset of generalized weakness with significant anorexia over the last couple of days.  He has been having dysuria and diminished urinary output.  No hematuria or flank pain.  He denies any fever or chills.  No chest pain or dyspnea or cough.  Has been having occasional wheezing.  No melena or bright red being per rectum.  No other bleeding diathesis.    Vascular surgery was consulted for left lower extremity gangrene toes and open sores. This patient is well know to vascular surgery. He was last seen July 24, 2022 and had an angiogram to the left lower extremity with angioplasty to the post tibial artery.   Today patient is resting comfortably in bed in his room.  I again discussed in detail the procedure, benefits, risks, complications.  Patient verbalizes understanding and wishes to proceed with the procedure.  Patient has been n.p.o. since midnight.  Patient does endorse pain to the left lower extremity at rest this morning.  Patient denies any chest pain or shortness of breath today.   Vitals:   08/11/22 0332 08/11/22 0857  BP: (!) 136/98 (!) 151/82  Pulse: 74 99  Resp: 20 (!) 22  Temp: 98.6 F (37 C) 98.2 F (36.8 C)  SpO2: 99% 98%   Physical Exam: Cardiac:  Irregular rate and rhythm, Hx of Afib.  Lungs:  Clear on auscultation,  Incisions:  None Extremities:  Right AKA, Left lower extremity  Abdomen:  Positive bowel sounds, soft, flat, non tender Neurologic: AAOX4, follows commands.   CBC    Component Value Date/Time   WBC 25.1 (H) 08/11/2022 0806   RBC 3.58 (L) 08/11/2022 0806   HGB 10.3 (L) 08/11/2022 0806   HGB 14.7 02/24/2012 0325   HCT 29.6 (L) 08/11/2022 0806   HCT 44.4 02/24/2012 0325   PLT 210  08/11/2022 0806   PLT 108 (L) 02/24/2012 0325   MCV 82.7 08/11/2022 0806   MCV 90 02/24/2012 0325   MCH 28.8 08/11/2022 0806   MCHC 34.8 08/11/2022 0806   RDW 13.7 08/11/2022 0806   RDW 14.0 02/24/2012 0325   LYMPHSABS 2.1 08/09/2022 2323   LYMPHSABS 2.0 02/24/2012 0325   MONOABS 2.0 (H) 08/09/2022 2323   MONOABS 0.7 02/24/2012 0325   EOSABS 0.0 08/09/2022 2323   EOSABS 0.1 02/24/2012 0325   BASOSABS 0.1 08/09/2022 2323   BASOSABS 0.1 02/24/2012 0325    BMET    Component Value Date/Time   NA 135 08/11/2022 0806   NA 143 02/24/2012 0325   K 3.4 (L) 08/11/2022 0806   K 4.1 02/24/2012 0325   CL 101 08/11/2022 0806   CL 108 (H) 02/24/2012 0325   CO2 26 08/11/2022 0806   CO2 26 02/24/2012 0325   GLUCOSE 89 08/11/2022 0806   GLUCOSE 120 (H) 02/24/2012 0325   BUN 11 08/11/2022 0806   BUN 25 (H) 02/24/2012 0325   CREATININE 1.22 08/11/2022 0806   CREATININE 1.47 (H) 02/24/2012 0325   CALCIUM 8.4 (L) 08/11/2022 0806   CALCIUM 9.1 02/24/2012 0325   GFRNONAA 60 (L) 08/11/2022 0806   GFRNONAA 48 (L) 02/24/2012 0325   GFRAA 55 (L) 02/24/2012 0325    INR    Component Value Date/Time   INR  1.6 (H) 08/11/2022 0806     Intake/Output Summary (Last 24 hours) at 08/11/2022 1046 Last data filed at 08/11/2022 0934 Gross per 24 hour  Intake 288.46 ml  Output 1100 ml  Net -811.54 ml     Assessment/Plan:  82 y.o. male with a long history of peripheral artery disease and right above-the-knee amputation.* No surgery date entered *   Plan: Patient will be taken to the vascular lab today for left lower extremity angiogram.  Patient's INR is down to 1.6 this morning.  Patient has been n.p.o. since midnight last night.  Again I discussed in detail the procedure, benefits, risks, and complications.  He verbalizes understanding.  I answered all his questions.  He would like to proceed as soon as possible.  DVT prophylaxis:  Lovenox 40 mg.  Discussed the plan in detail with Dr. Festus Barren  MD and he agrees with the plan   Marcie Bal [facility] 08/11/2022 10:46 AM

## 2022-08-11 NOTE — Progress Notes (Signed)
Pharmacy Antibiotic Note  [name] [name] is a 82 y.o. male admitted on 08/09/2022 with left second toe infection with foul-smelling discharge. PMH CHF, Afib, peripheral vascular disease. Pharmacy has been consulted for cefepime dosing.  Day 2 of antibiotics. Plan is for angiogram today, 08/11/22. CrCl 53 mL/min (30-60 mL/min).  Plan: Continue cefepime IV 2 grams every 12 hours  Follow up podiatry plans as well as renal function and dose adjustments.   Height: 6' (182.9 cm) Weight: 89.1 kg (196 lb 6.9 oz) IBW/kg (Calculated) : 77.6  Temp (24hrs), Avg:98.4 F (36.9 C), Min:98 F (36.7 C), Max:99.2 F (37.3 C)  Recent Labs  Lab 08/09/22 2323 08/10/22 0117 08/10/22 0418 08/10/22 0545 08/11/22 0806  WBC 25.6*  --  22.3*  --  25.1*  CREATININE 1.72*  --   --  1.35* 1.22  LATICACIDVEN 3.5* 3.7* 1.7  --   --     Estimated Creatinine Clearance: 53 mL/min (by C-G formula based on SCr of 1.22 mg/dL).    No Known Allergies  Antimicrobials this admission: Cefepime 4/18 >>  Metronidazole 4/18 >> 4/19 Vancomycin 4/8 >> 4/19  Dose adjustments this admission: Cefepime 2 grams every 12 hours due to CrCl 30-60 mL/min  Microbiology results: 4/17 BCx: no growth x 2 days 4/18 UCx: pending   Thank you for allowing pharmacy to be a part of this patient's care.   Elliot Gurney, PharmD, BCPS Clinical Pharmacist  08/11/2022 10:50 AM

## 2022-08-11 NOTE — Consult Note (Signed)
ORTHOPAEDIC PROGRESS NOTE  REQUESTING PHYSICIAN: Enedina Finner, MD  Chief Complaint: Left second toe gangrene  HPI: [name] [name] is a 82 y.o. male   who complains of worsening gangrenous changes to his left foot and second toe.  Admitted with gangrene to his second toe.  Patient has been seen by vascular surgery.  Attempted revascularization was performed last month.  They are planning on angio once his INR comes down.  It was too elevated today to have angio performed.  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    Past Medical History:  Diagnosis Date   CHF (congestive heart failure)    Diabetes    Dysrhythmia    AFIB   HTN (hypertension)    Infection    LEG   Pancreatitis    Past Surgical History:  Procedure Laterality Date   AMPUTATION Right 03/09/2021   Procedure: AMPUTATION FOOT;  Surgeon: Annice Needy, MD;  Location: [facility];  Service: General;  Laterality: Right;   AMPUTATION Right 03/11/2021   Procedure: AMPUTATION ABOVE KNEE;  Surgeon: Annice Needy, MD;  Location: [facility];  Service: General;  Laterality: Right;   CATARACT EXTRACTION W/PHACO Right 03/08/2017   Procedure: CATARACT EXTRACTION PHACO AND INTRAOCULAR LENS PLACEMENT (IOC);  Surgeon: Nevada Crane, MD;  Location: [facility];  Service: Ophthalmology;  Laterality: Right;  Lot# [id number] [place]: 00:30.9 AP%: 8.2 CDE: 2.54   CATARACT EXTRACTION W/PHACO Left 04/04/2017   Procedure: CATARACT EXTRACTION PHACO AND INTRAOCULAR LENS PLACEMENT (IOC);  Surgeon: Nevada Crane, MD;  Location: [facility];  Service: Ophthalmology;  Laterality: Left;  [place] 00:31.4 AP% 12.0 CDE 3.78 Fluid Pack lot # [id number]   CHOLECYSTECTOMY     EYE SURGERY     right eye   LOWER EXTREMITY ANGIOGRAPHY Right 02/10/2021   Procedure: LOWER EXTREMITY ANGIOGRAPHY;  Surgeon: Annice Needy, MD;  Location: [facility];  Service: Cardiovascular;  Laterality: Right;   LOWER EXTREMITY ANGIOGRAPHY Left 07/03/2022    Procedure: Lower Extremity Angiography;  Surgeon: Annice Needy, MD;  Location: [facility];  Service: Cardiovascular;  Laterality: Left;   LOWER EXTREMITY ANGIOGRAPHY Left 07/24/2022   Procedure: Lower Extremity Angiography;  Surgeon: Annice Needy, MD;  Location: [facility];  Service: Cardiovascular;  Laterality: Left;   Social History   Socioeconomic History   Marital status: Married    Spouse name: Not on file   Number of children: Not on file   Years of education: Not on file   Highest education level: Not on file  Occupational History   Not on file  Tobacco Use   Smoking status: Never   Smokeless tobacco: Current    Types: Chew  Substance and Sexual Activity   Alcohol use: No    Alcohol/week: 0.0 standard drinks of alcohol   Drug use: No   Sexual activity: Not on file  Other Topics Concern   Not on file  Social History Narrative   Not on file   Social Determinants of Health   Financial Resource Strain: Not on file  Food Insecurity: No Food Insecurity (08/10/2022)   Hunger Vital Sign    Worried About Running Out of Food in the Last Year: Never true    Ran Out of Food in the Last Year: Never true  Transportation Needs: No Transportation Needs (08/10/2022)   PRAPARE - Administrator, Civil Service (Medical): No    Lack of Transportation (Non-Medical): No  Physical Activity: Not on file  Stress: Not on file  Social Connections: Not on file   History reviewed. No pertinent family history. No Known Allergies Prior to Admission medications   Medication Sig Start Date End Date Taking? Authorizing Provider  ascorbic acid (VITAMIN C) 500 MG tablet Take 500 mg by mouth daily. 10/06/21   [provider]  aspirin EC 81 MG tablet Take 1 tablet (81 mg total) by mouth daily. Swallow whole. 07/03/22 07/03/23  Annice Needy, MD  atorvastatin (LIPITOR) 10 MG tablet Take 1 tablet (10 mg total) by mouth daily. 02/10/21 06/06/22  Annice Needy, MD   carvedilol (COREG) 6.25 MG tablet Take 6.25 mg 2 (two) times daily by mouth. 12/19/16   [provider]  digoxin (LANOXIN) 0.125 MG tablet Take 0.125 mcg daily by mouth. 12/19/16   [provider]  ferrous gluconate (FERGON) 324 MG tablet Take 1 tablet (324 mg total) by mouth 2 (two) times daily with a meal. 03/16/21 06/06/22  Charise Killian, MD  ferrous sulfate 325 (65 FE) MG tablet Take by mouth. 05/11/21 06/06/22  [provider]  furosemide (LASIX) 20 MG tablet Take 20 mg daily by mouth. 12/19/16   [provider]  furosemide (LASIX) 20 MG tablet Take 1 tablet by mouth daily. 10/05/21   [provider]  gabapentin (NEURONTIN) 100 MG capsule Take 1 capsule (100 mg total) by mouth 3 (three) times daily. 03/16/21 06/06/22  Charise Killian, MD  lisinopril (PRINIVIL,ZESTRIL) 10 MG tablet Take 15 mg by mouth daily.    [provider]  metFORMIN (GLUCOPHAGE) 1000 MG tablet Take 1,000 mg by mouth 2 (two) times daily with a meal.    [provider]  rivaroxaban (XARELTO) 20 MG TABS tablet Take 20 mg daily by mouth.    [provider]  SSD 1 % cream Apply topically. Patient not taking: Reported on 07/03/2022 07/12/21   [provider]  vitamin B-12 (CYANOCOBALAMIN) 1000 MCG tablet Take 1,000 mcg by mouth daily.    [provider]   PERIPHERAL VASCULAR CATHETERIZATION  Result Date: 08/11/2022 See surgical note for result.  DG Foot 2 Views Left  Result Date: 08/10/2022 CLINICAL DATA:  [id number] with sepsis due to cellulitis. EXAM: LEFT FOOT - 2 VIEW COMPARISON:  None Available. FINDINGS: There is mild diffuse edema. There are vascular calcifications in the distal foreleg and foot, with extension into the great toe. No soft tissue gas or radiopaque foreign body are seen. Normal bone mineralization. There is a well-marginated erosive lesion in the medial aspect of the distal first metatarsal measuring 1.5 cm across and  at least 5 mm in depth. There is a thin sclerotic border and narrow zone of transition to the underlying normal bone. There are no visible calcifications in the erosion. There are no other erosive lesions and no aggressive destructive changes. Evidence of fractures is not seen. There are mild features of degenerative arthrosis of the first MTP joint and a small plantar calcaneal spur. IMPRESSION: 1. Mild diffuse edema. No soft tissue gas or radiopaque foreign body. 2. Well-marginated erosive lesion in the medial aspect of the distal first metatarsal, without visible calcifications. This is nonspecific but could be due to gout or chronic osteomyelitis. 3. 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 4. Vascular calcifications in the distal foreleg and foot. Electronically Signed   By: Almira Bar M.D.   On: 08/10/2022 07:21   CT Head Wo Contrast  Result Date:  08/10/2022 CLINICAL DATA:  Not eating well for 2 days. Lethargy and hypotension. EXAM: CT HEAD WITHOUT CONTRAST TECHNIQUE: Contiguous axial images were obtained from the base of the skull through the vertex without intravenous contrast. RADIATION DOSE REDUCTION: This exam was performed according to the departmental dose-optimization program which includes automated exposure control, adjustment of the mA and/or kV according to patient size and/or use of iterative reconstruction technique. COMPARISON:  CT head 02/24/2012 FINDINGS: Brain: No intracranial hemorrhage, mass effect, or evidence of acute infarct. No hydrocephalus. No extra-axial fluid collection. Generalized cerebral atrophy. Ill-defined hypoattenuation within the cerebral white matter is nonspecific but consistent with chronic small vessel ischemic disease. 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 Physiologic basal ganglia calcifications. Vascular: No hyperdense vessel. Intracranial arterial calcification. Skull: No fracture or focal lesion. Sinuses/Orbits: No acute  finding. Paranasal sinuses and mastoid air cells are well aerated. Other: None. IMPRESSION: 1. No evidence of acute intracranial abnormality. Electronically Signed   By: Minerva Fester M.D.   On: 08/10/2022 00:14   DG Chest Port 1 View  Result Date: 08/09/2022 CLINICAL DATA:  Weakness EXAM: PORTABLE CHEST 1 VIEW COMPARISON:  Radiographs 03/08/2021 FINDINGS: Stable cardiomegaly Aortic atherosclerotic calcification. No focal consolidation, pleural effusion, or pneumothorax. No displaced rib fractures. IMPRESSION: 1. No active disease. 2. Cardiomegaly. Electronically Signed   By: Minerva Fester M.D.   On: 08/09/2022 23:31    Positive ROS: All other systems have been reviewed and were otherwise negative with the exception of those mentioned in the HPI and as above.  12 point ROS was performed.  Physical Exam: General: Alert and oriented.  No apparent distress.  Vascular:  Left foot:Dorsalis Pedis:  absent Posterior Tibial:  absent  Right foot:  Status post right AKA  Neuro:absent protective sensation  Derm: Gangrene to the left second toe to the level of the MTPJ.  Worsening darkened discoloration from the midfoot distally.  Plantarly into the arch there is darkened discoloration as well.  Ortho/MS: Status post right-sided AKA.  Diffuse edema to the left foot and ankle.  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 -Discussed gangrene present to the left forefoot. -Appreciate vascular recommendations.  Patient has one-vessel runoff to the foot through the posterior tibial artery but has chronic occlusion of the peroneal and anterior tibial. -All treatment options were discussed with the patient.  Discussed potential transmetatarsal amputation versus more proximal amputation.  Believe that patient has a low chance of healing transmetatarsal amputation due to limited blood  flow to the area.  Believe that patient's tissues also appear to be fairly necrotic over the dorsal and plantar forefoot.  Discussed with patient we could attempt transmetatarsal amputation but likelihood of healing is low.  When discussing this with patient and family, they want to not put the patient through any further procedures if possible and would rather just do 1 procedure that is likely to work.  Discussed that the more likely procedure to work as a more proximal amputation such as BKA or AKA.  They have elected for more proximal amputation when discussing today. -Sent message to vascular surgery discussing this.  They will plan for proximal amputation at some point in the near future.  [name] [name], DPM  08/11/2022 3:57 PM

## 2022-08-11 NOTE — Progress Notes (Signed)
[facility]  - [place] at [facility]   PATIENT NAME: [name] [name]    MR#:  [id number]  DATE OF BIRTH:  Dec 24, 1940  SUBJECTIVE:  No family at bedside. Spoke with wife [name] [name] on the phone and updated. Patient had blood urine yesterday. Per RN clearing up. Patient denies any dysuria. Not the best historian. NPO for vascular procedure today.    VITALS:  Blood pressure (!) 151/82, pulse 99, temperature 98.2 F (36.8 C), temperature source Oral, resp. rate (!) 22, height 6' (1.829 m), weight 89.1 kg, SpO2 98 %.  PHYSICAL EXAMINATION:   GENERAL:  82 y.o.-year-old patient with no acute distress.  LUNGS: Normal breath sounds bilaterally, no wheezing CARDIOVASCULAR: S1, S2 normal. No murmur   ABDOMEN: Soft, nontender, nondistended. Bowel sounds present.  EXTREMITIES:  NEUROLOGIC: nonfocal  patient is alert and awake SKIN: as above  LABORATORY PANEL:  CBC Recent Labs  Lab 08/11/22 0806  WBC 25.1*  HGB 10.3*  HCT 29.6*  PLT 210     Chemistries  Recent Labs  Lab 08/09/22 2323 08/10/22 0545 08/11/22 0806  NA 132*   < > 135  K 4.5   < > 3.4*  CL 98   < > 101  CO2 23   < > 26  GLUCOSE 179*   < > 89  BUN 13   < > 11  CREATININE 1.72*   < > 1.22  CALCIUM 8.6*   < > 8.4*  AST 29  --   --   ALT 10  --   --   ALKPHOS 87  --   --   BILITOT 2.0*  --   --    < > = values in this interval not displayed.    Cardiac Enzymes No results for input(s): "TROPONINI" in the last 168 hours. RADIOLOGY:  DG Foot 2 Views Left  Result Date: 08/10/2022 CLINICAL DATA:  [id number] with sepsis due to cellulitis. EXAM: LEFT FOOT - 2 VIEW COMPARISON:  None Available. FINDINGS: There is mild diffuse edema. There are vascular calcifications in the distal foreleg and foot, with extension into the great toe. No soft tissue gas or radiopaque foreign body are seen. Normal bone mineralization. There is a well-marginated erosive lesion in the medial aspect of the distal first metatarsal  measuring 1.5 cm across and at least 5 mm in depth. There is a thin sclerotic border and narrow zone of transition to the underlying normal bone. There are no visible calcifications in the erosion. There are no other erosive lesions and no aggressive destructive changes. Evidence of fractures is not seen. There are mild features of degenerative arthrosis of the first MTP joint and a small plantar calcaneal spur. IMPRESSION: 1. Mild diffuse edema. No soft tissue gas or radiopaque foreign body. 2. Well-marginated erosive lesion in the medial aspect of the distal first metatarsal, without visible calcifications. This is nonspecific but could be due to gout or chronic osteomyelitis. 3. 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 4. Vascular calcifications in the distal foreleg and foot. Electronically Signed   By: Almira Bar M.D.   On: 08/10/2022 07:21   CT Head Wo Contrast  Result Date: 08/10/2022 CLINICAL DATA:  Not eating well for 2 days. Lethargy and hypotension. EXAM: CT HEAD WITHOUT CONTRAST TECHNIQUE: Contiguous axial images were obtained from the base of the skull through the vertex without intravenous contrast. RADIATION DOSE REDUCTION: This exam was performed according to the departmental dose-optimization program which includes automated exposure  control, adjustment of the mA and/or kV according to patient size and/or use of iterative reconstruction technique. COMPARISON:  CT head 02/24/2012 FINDINGS: Brain: No intracranial hemorrhage, mass effect, or evidence of acute infarct. No hydrocephalus. No extra-axial fluid collection. Generalized cerebral atrophy. Ill-defined hypoattenuation within the cerebral white matter is nonspecific but consistent with chronic small vessel ischemic disease. 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 Physiologic basal ganglia calcifications. Vascular: No hyperdense vessel. Intracranial arterial calcification. Skull: No fracture or focal lesion.  Sinuses/Orbits: No acute finding. Paranasal sinuses and mastoid air cells are well aerated. Other: None. IMPRESSION: 1. No evidence of acute intracranial abnormality. Electronically Signed   By: Minerva Fester M.D.   On: 08/10/2022 00:14   DG Chest Port 1 View  Result Date: 08/09/2022 CLINICAL DATA:  Weakness EXAM: PORTABLE CHEST 1 VIEW COMPARISON:  Radiographs 03/08/2021 FINDINGS: Stable cardiomegaly Aortic atherosclerotic calcification. No focal consolidation, pleural effusion, or pneumothorax. No displaced rib fractures. IMPRESSION: 1. No active disease. 2. Cardiomegaly. Electronically Signed   By: Minerva Fester M.D.   On: 08/09/2022 23:31    Assessment and Plan  [name] [name] is a 82 y.o. male brought to the ED via EMS from home with a chief complaint of generalized weakness and fatigue.  Patient with a history of CHF, atrial fibrillation on Xarelto, on Digoxin.  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 -- Patient was started on broad-spectrum antibiotic with vancomycin, Flagyl,cefepime-->cont cefepime -- blood pressure much improved. Lactic acid normalized. No fever. -- Podiatry consultation with Dr. Wilber Bihari fowler--no need for MRI  Severe peripheral arterial disease -- patient underwent left lower extremity angiogram in March and April per family -- Dr. Wyn Quaker had to hold off on angiogram lower extremity due to elevated INR. Patient on Xarelto. -- Further workup per Dr. Wyn Quaker -- Xarelto on hold -- INR 1.6. Angiogram lower extremity plan for today to see circulation. Plans for left LE amputation next week  Type II diabetes,  hypoglycemia, peripheral neuropathy -- A1c 5.5 -- sliding scale insulin -- continue Neurontin -- received IV dextrose drip. Sugars now stable  Hypertension -- continue home meds  Hyperlipidemia -- continue statins  Hematuria in the setting of INR 4.5 (on xarelto) --send urine culture --cont IV abxs --monitor H and H  --consider urology consult if symptoms worsen    Procedures: Family communication :wife [name] [name] on the phone Consults : podiatry, vascular CODE STATUS: full DVT Prophylaxis :Lovenox Level of care: Telemetry Medical Status is: Inpatient Remains inpatient appropriate because: sepsis, left 2nd toe gangrene    TOTAL TIME TAKING CARE OF THIS PATIENT: 35 minutes.  >50% time spent on counselling and coordination of care  Note: This dictation was prepared with Dragon dictation along with smaller phrase technology. Any transcriptional errors that result from this process are unintentional.  Enedina Finner M.D    [facility]   CC: Primary care physician; Luciana Axe, NP

## 2022-08-11 NOTE — Interval H&P Note (Signed)
History and Physical Interval Note:  08/11/2022 1:25 PM  [name] [name]  has presented today for surgery, with the diagnosis of PAD with osteomyletis.  The various methods of treatment have been discussed with the patient and family. After consideration of risks, benefits and other options for treatment, the patient has consented to  Procedure(s): Lower Extremity Angiography (Left) as a surgical intervention.  The patient's history has been reviewed, patient examined, no change in status, stable for surgery.  I have reviewed the patient's chart and labs.  Questions were answered to the patient's satisfaction.     Festus Barren

## 2022-08-11 NOTE — Op Note (Signed)
[facility]  Percutaneous Study/Intervention Procedural Note   Date of Surgery: 08/11/2022  Surgeon(s):Messiah Rovira    Assistants:none  Pre-operative Diagnosis: PAD with gangrene left foot  Post-operative diagnosis:  Same  Procedure(s) Performed:             1.  Ultrasound guidance for vascular access right femoral artery             2.  Catheter placement into left superficial femoral artery from right femoral approach             3.  Aortogram and selective left lower extremity angiogram             4.  StarClose closure device right femoral artery  EBL: 5 cc  Contrast: 25 cc  Fluoro Time: 1.1 minutes  Moderate Conscious Sedation Time: approximately 11 minutes using 1 mg of Versed and 50 mcg of Fentanyl              Indications:  Patient is a 82 y.o.male with gangrenous changes to the left foot with known history of peripheral arterial disease with previous revascularization earlier this year. The patient is brought in for angiography for further evaluation and potential treatment.  Due to the limb threatening nature of the situation, angiogram was performed for attempted limb salvage. The patient is aware that if the procedure fails, amputation would be expected.  The patient also understands that even with successful revascularization, amputation may still be required due to the severity of the situation.  Risks and benefits are discussed and informed consent is obtained.   Procedure:  The patient was identified and appropriate procedural time out was performed.  The patient was then placed supine on the table and prepped and draped in the usual sterile fashion. Moderate conscious sedation was administered during a face to face encounter with the patient throughout the procedure with my supervision of the RN administering medicines and monitoring the patient's vital signs, pulse oximetry, telemetry and mental status throughout from the start of the procedure  until the patient was taken to the recovery room. Ultrasound was used to evaluate the right common femoral artery.  It was patent .  A digital ultrasound image was acquired.  A Seldinger needle was used to access the right common femoral artery under direct ultrasound guidance and a permanent image was performed.  A 0.035 J wire was advanced without resistance and a 5Fr sheath was placed.  Pigtail catheter was placed into the aorta and an AP aortogram was performed. This demonstrated normal renal arteries and normal aorta and iliac segments without significant stenosis. I then crossed the aortic bifurcation and advanced to the left femoral head.  After the initial picture, the pigtail catheter was advanced to the mid SFA to help opacify distally to evaluate the tibial vessels.  Selective left lower extremity angiogram was then performed. This demonstrated fairly normal common femoral artery, superficial femoral artery, and popliteal artery with very mild disease which was not flow-limiting.  The posterior tibial artery that had been treated several weeks ago was open and patent into the foot providing good flow into the foot.  The peroneal artery was small and did not really provide flow into the foot.  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  His flow is fairly normal into the foot through a large posterior tibial artery and no further endovascular revascularization would be of benefit.  I elected to terminate the  procedure. The sheath was removed and StarClose closure device was deployed in the right femoral artery with excellent hemostatic result. The patient was taken to the recovery room in stable condition having tolerated the procedure well.  Findings:               Aortogram:  This demonstrated normal renal arteries and normal aorta and iliac segments without significant stenosis             Left Lower Extremity:  This demonstrated fairly normal common  femoral artery, superficial femoral artery, and popliteal artery with very mild disease which was not flow-limiting.  The posterior tibial artery that had been treated several weeks ago was open and patent into the foot providing good flow into the foot.  The peroneal artery was small and did not really provide flow into the foot.  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   Disposition: Patient was taken to the recovery room in stable condition having tolerated the procedure well.  Complications: None  Festus Barren 08/11/2022 2:34 PM   This note was created with Dragon Medical transcription system. Any errors in dictation are purely unintentional.

## 2022-08-12 DIAGNOSIS — I96 Gangrene, not elsewhere classified: Secondary | ICD-10-CM | POA: Diagnosis not present

## 2022-08-12 DIAGNOSIS — E1142 Type 2 diabetes mellitus with diabetic polyneuropathy: Secondary | ICD-10-CM | POA: Diagnosis not present

## 2022-08-12 DIAGNOSIS — N179 Acute kidney failure, unspecified: Secondary | ICD-10-CM | POA: Diagnosis not present

## 2022-08-12 DIAGNOSIS — A419 Sepsis, unspecified organism: Secondary | ICD-10-CM | POA: Diagnosis not present

## 2022-08-12 LAB — GLUCOSE, CAPILLARY
Glucose-Capillary: 75 mg/dL (ref 70–99)
Glucose-Capillary: 99 mg/dL (ref 70–99)
Glucose-Capillary: 104 mg/dL — ABNORMAL HIGH (ref 70–99)
Glucose-Capillary: 113 mg/dL — ABNORMAL HIGH (ref 70–99)
Glucose-Capillary: 100 mg/dL — ABNORMAL HIGH (ref 70–99)

## 2022-08-12 LAB — TYPE AND SCREEN
ABO/RH(D): A POS
Antibody Screen: NEGATIVE

## 2022-08-12 NOTE — H&P (Signed)
[place] [facility] History & Physical Update  The patient was interviewed and re-examined.  The patient's previous History and Physical has been reviewed and is unchanged.  There is no change in the plan of care.  Operative time available for tomorrow morning. Extensive discussion with patient and wife at bedside.  Will proceed with LEFT above knee amputation tomorrow morning. Risks, benefits and alternatives explained. Understanding expressed. All questions answered.  Bertram Denver, MD  08/12/2022, 1:38 PM

## 2022-08-12 NOTE — Progress Notes (Signed)
1 Day Post-Op   Subjective/Chief Complaint:  Without complaint. No family at bedside.  Objective: Vital signs in last 24 hours: Temp:  [97.8 F (36.6 C)-100 F (37.8 C)] 97.8 F (36.6 C) (04/20 0754) Pulse Rate:  [0-99] 37 (04/20 0754) Resp:  [15-26] 18 (04/20 0754) BP: (104-155)/(60-99) 104/60 (04/20 0754) SpO2:  [86 %-100 %] 100 % (04/20 0754) Last BM Date : 08/10/22  Intake/Output from previous day: 04/19 0701 - 04/20 0700 In: 150 [P.O.:150] Out: -  Intake/Output this shift: No intake/output data recorded.  General appearance: no distress Extremities: Left foot gangrene  Lab Results:  Recent Labs    08/10/22 0418 08/11/22 0806  WBC 22.3* 25.1*  HGB 9.6* 10.3*  HCT 28.0* 29.6*  PLT 176 210   BMET Recent Labs    08/10/22 0545 08/11/22 0806  NA 136 135  K 3.6 3.4*  CL 104 101  CO2 23 26  GLUCOSE 92 89  BUN 12 11  CREATININE 1.35* 1.22  CALCIUM 8.1* 8.4*   PT/INR Recent Labs    08/10/22 0418 08/11/22 0806  LABPROT 42.5* 19.2*  INR 4.5* 1.6*   ABG No results for input(s): "PHART", "HCO3" in the last 72 hours.  Invalid input(s): "PCO2", "PO2"  Studies/Results: PERIPHERAL VASCULAR CATHETERIZATION  Result Date: 08/11/2022 See surgical note for result.   Anti-infectives: Anti-infectives (From admission, onward)    Start     Dose/Rate Route Frequency Ordered Stop   08/11/22 1259  ceFAZolin (ANCEF) IVPB 2g/100 mL premix  Status:  Discontinued        2 g 200 mL/hr over 30 Minutes Intravenous 30 min pre-op 08/11/22 1259 08/11/22 1526   08/11/22 0000  vancomycin (VANCOREADY) IVPB 1250 mg/250 mL  Status:  Discontinued        1,250 mg 166.7 mL/hr over 90 Minutes Intravenous Every 24 hours 08/10/22 0404 08/10/22 1141   08/10/22 1200  ceFEPIme (MAXIPIME) 2 g in sodium chloride 0.9 % 100 mL IVPB        2 g 200 mL/hr over 30 Minutes Intravenous Every 12 hours 08/10/22 0345 08/16/22 2359   08/10/22 1200  metroNIDAZOLE (FLAGYL) IVPB 500 mg  Status:   Discontinued        500 mg 100 mL/hr over 60 Minutes Intravenous Every 12 hours 08/10/22 0345 08/10/22 1139   08/10/22 0400  vancomycin (VANCOCIN) IVPB 1000 mg/200 mL premix  Status:  Discontinued        1,000 mg 200 mL/hr over 60 Minutes Intravenous  Once 08/10/22 0345 08/10/22 0404   08/10/22 0015  ceFEPIme (MAXIPIME) 2 g in sodium chloride 0.9 % 100 mL IVPB        2 g 200 mL/hr over 30 Minutes Intravenous  Once 08/10/22 0006 08/10/22 0125   08/10/22 0015  metroNIDAZOLE (FLAGYL) IVPB 500 mg        500 mg 100 mL/hr over 60 Minutes Intravenous  Once 08/10/22 0006 08/10/22 0227   08/10/22 0015  vancomycin (VANCOCIN) IVPB 1000 mg/200 mL premix  Status:  Discontinued        1,000 mg 200 mL/hr over 60 Minutes Intravenous  Once 08/10/22 0006 08/10/22 0013   08/10/22 0015  vancomycin (VANCOREADY) IVPB 2000 mg/400 mL        2,000 mg 200 mL/hr over 120 Minutes Intravenous  Once 08/10/22 0013 08/10/22 0427       Assessment/Plan: s/p Procedure(s): Lower Extremity Angiography (Left) LEFT foot Gangrene with one vessel runoff-PT  Low probability of healing a TMA- recommend  AKA Plan for today/tomorrow- however, Operating room still reorganizing after being down last night. In addition, the patient is now hesitant to proceed. Will discuss with patient family and plan for early next week.  LOS: 2 days    Bertram Denver 08/12/2022

## 2022-08-12 NOTE — Progress Notes (Signed)
[facility]  - [place] at [facility]   PATIENT NAME: [name] [name]    MR#:  [id number]  DATE OF BIRTH:  [birth year]  SUBJECTIVE:  No family at bedside. Patient denies any dysuria. Not the best historian. No new complaints  VITALS:  Blood pressure 118/61, pulse 64, temperature 98.2 F (36.8 C), resp. rate 18, height 6' (1.829 m), weight 89.1 kg, SpO2 98 %.  PHYSICAL EXAMINATION:   GENERAL:  82 y.o.-year-old patient with no acute distress.  LUNGS: Normal breath sounds bilaterally, no wheezing CARDIOVASCULAR: S1, S2 normal. No murmur   ABDOMEN: Soft, nontender, nondistended. Bowel sounds present.  EXTREMITIES:  NEUROLOGIC: nonfocal  patient is alert and awake SKIN: as above  LABORATORY PANEL:  CBC Recent Labs  Lab 08/11/22 0806  WBC 25.1*  HGB 10.3*  HCT 29.6*  PLT 210     Chemistries  Recent Labs  Lab 08/09/22 2323 08/10/22 0545 08/11/22 0806  NA 132*   < > 135  K 4.5   < > 3.4*  CL 98   < > 101  CO2 23   < > 26  GLUCOSE 179*   < > 89  BUN 13   < > 11  CREATININE 1.72*   < > 1.22  CALCIUM 8.6*   < > 8.4*  AST 29  --   --   ALT 10  --   --   ALKPHOS 87  --   --   BILITOT 2.0*  --   --    < > = values in this interval not displayed.    Cardiac Enzymes No results for input(s): "TROPONINI" in the last 168 hours. RADIOLOGY:  PERIPHERAL VASCULAR CATHETERIZATION  Result Date: 08/11/2022 See surgical note for result.   Assessment and Plan  [name] [name] is a 82 y.o. male brought to the ED via EMS from home with a chief complaint of generalized weakness and fatigue.  Patient with a history of CHF, atrial fibrillation on Xarelto, on Digoxin.  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 -- Patient was started on broad-spectrum antibiotic with vancomycin, Flagyl,cefepime-->cont cefepime -- blood pressure much improved. Lactic acid normalized. No fever. -- Podiatry consultation with Dr. Wilber Bihari fowler--no need for MRI  Severe peripheral arterial disease -- patient underwent left lower extremity angiogram in March and April per family -- Dr. Wyn Quaker had to hold off on angiogram lower extremity due to elevated INR. Patient on Xarelto. -- Further workup per Dr. Wyn Quaker -- Xarelto on hold -- INR 1.6.  -- s/p Angiogram lower extremity:08/11/22  Aortogram:  This demonstrated normal renal arteries and normal aorta and iliac segments without significant stenosis  Left Lower Extremity:  This demonstrated fairly normal common femoral artery, superficial femoral artery, and popliteal artery with very mild disease which was not flow-limiting.  The posterior tibial artery that had been treated several weeks ago was open and patent into the foot providing good flow into the foot.  The peroneal artery was small and did not really provide flow into the foot.  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 ---Plans for left LE amputation next week  Type  II diabetes, hypoglycemia, peripheral neuropathy -- A1c 5.5 -- sliding scale insulin -- continue Neurontin -- received IV dextrose drip. Sugars now stable  Hypertension -- continue home meds  Hyperlipidemia -- continue statins  Hematuria in the setting of INR 4.5 (on xarelto) --send urine culture--NO growth --monitor H and H  --consider urology consult if symptoms worsen   Procedures: left LE angiogram Family communication :wife Pattricia Boss on the phone 4/19 Consults : podiatry, vascular CODE STATUS: full DVT Prophylaxis :Lovenox Level of care: Telemetry Medical Status is: Inpatient Remains  inpatient appropriate because: sepsis, left 2nd toe gangrene    TOTAL TIME TAKING CARE OF THIS PATIENT: 35 minutes.  >50% time spent on counselling and coordination of care  Note: This dictation was prepared with Dragon dictation along with smaller phrase technology. Any transcriptional errors that result from this process are unintentional.  Enedina Finner M.D    [facility]   CC: Primary care physician; Luciana Axe, NP

## 2022-08-13 ENCOUNTER — Inpatient Hospital Stay: Payer: Medicare HMO | Admitting: Anesthesiology

## 2022-08-13 ENCOUNTER — Encounter
Admission: EM | Disposition: A | Discharge status: Skilled Nursing Facility | Payer: Self-pay | Source: Home / Self Care | Attending: Internal Medicine

## 2022-08-13 DIAGNOSIS — N179 Acute kidney failure, unspecified: Secondary | ICD-10-CM | POA: Diagnosis not present

## 2022-08-13 DIAGNOSIS — I96 Gangrene, not elsewhere classified: Secondary | ICD-10-CM | POA: Diagnosis not present

## 2022-08-13 DIAGNOSIS — A419 Sepsis, unspecified organism: Secondary | ICD-10-CM | POA: Diagnosis not present

## 2022-08-13 DIAGNOSIS — E1142 Type 2 diabetes mellitus with diabetic polyneuropathy: Secondary | ICD-10-CM | POA: Diagnosis not present

## 2022-08-13 HISTORY — PX: AMPUTATION: SHX166

## 2022-08-13 LAB — BASIC METABOLIC PANEL
Anion gap: 9 (ref 5–15)
BUN: 15 mg/dL (ref 8–23)
CO2: 23 mmol/L (ref 22–32)
Calcium: 8.2 mg/dL — ABNORMAL LOW (ref 8.9–10.3)
Chloride: 102 mmol/L (ref 98–111)
Creatinine, Ser: 1.39 mg/dL — ABNORMAL HIGH (ref 0.61–1.24)
GFR, Estimated: 51 mL/min — ABNORMAL LOW (ref >60)
Glucose, Bld: 85 mg/dL (ref 70–99)
Potassium: 3.4 mmol/L — ABNORMAL LOW (ref 3.5–5.1)
Sodium: 134 mmol/L — ABNORMAL LOW (ref 135–145)

## 2022-08-13 LAB — CBC WITH DIFFERENTIAL/PLATELET
Abs Immature Granulocytes: 0.4 10*3/uL — ABNORMAL HIGH (ref 0.00–0.07)
Basophils Absolute: 0 10*3/uL (ref 0.0–0.1)
Basophils Relative: 0 %
Eosinophils Absolute: 0.1 10*3/uL (ref 0.0–0.5)
Eosinophils Relative: 0 %
HCT: 25.4 % — ABNORMAL LOW (ref 39.0–52.0)
Hemoglobin: 9 g/dL — ABNORMAL LOW (ref 13.0–17.0)
Immature Granulocytes: 2 %
Lymphocytes Relative: 8 %
Lymphs Abs: 1.6 10*3/uL (ref 0.7–4.0)
MCH: 28.6 pg (ref 26.0–34.0)
MCHC: 35.4 g/dL (ref 30.0–36.0)
MCV: 80.6 fL (ref 80.0–100.0)
Monocytes Absolute: 2 10*3/uL — ABNORMAL HIGH (ref 0.1–1.0)
Monocytes Relative: 10 %
Neutro Abs: 16.5 10*3/uL — ABNORMAL HIGH (ref 1.7–7.7)
Neutrophils Relative %: 80 %
Platelets: 193 10*3/uL (ref 150–400)
RBC: 3.15 MIL/uL — ABNORMAL LOW (ref 4.22–5.81)
RDW: 14.1 % (ref 11.5–15.5)
WBC: 20.6 10*3/uL — ABNORMAL HIGH (ref 4.0–10.5)
nRBC: 0 % (ref 0.0–0.2)

## 2022-08-13 LAB — GLUCOSE, CAPILLARY
Glucose-Capillary: 107 mg/dL — ABNORMAL HIGH (ref 70–99)
Glucose-Capillary: 143 mg/dL — ABNORMAL HIGH (ref 70–99)
Glucose-Capillary: 75 mg/dL (ref 70–99)
Glucose-Capillary: 85 mg/dL (ref 70–99)
Glucose-Capillary: 92 mg/dL (ref 70–99)
Glucose-Capillary: 94 mg/dL (ref 70–99)
Glucose-Capillary: 95 mg/dL (ref 70–99)

## 2022-08-13 LAB — PROTIME-INR
INR: 1.6 — ABNORMAL HIGH (ref 0.8–1.2)
Prothrombin Time: 19.2 seconds — ABNORMAL HIGH (ref 11.4–15.2)

## 2022-08-13 SURGERY — AMPUTATION, ABOVE KNEE
Anesthesia: General | Site: Knee | Laterality: Left

## 2022-08-13 MED ORDER — VASOPRESSIN 20 UNIT/ML IV SOLN
INTRAVENOUS | Status: AC
  Filled 2022-08-13: qty 1

## 2022-08-13 MED ORDER — FENTANYL CITRATE (PF) 100 MCG/2ML IJ SOLN
INTRAMUSCULAR | Status: AC
  Filled 2022-08-13: qty 2

## 2022-08-13 MED ORDER — PROPOFOL 10 MG/ML IV BOLUS
INTRAVENOUS | Status: DC | PRN
  Administered 2022-08-13: 100 mg via INTRAVENOUS
  Administered 2022-08-13: 30 mg via INTRAVENOUS

## 2022-08-13 MED ORDER — 0.9 % SODIUM CHLORIDE (POUR BTL) OPTIME
TOPICAL | Status: DC | PRN
  Administered 2022-08-13: 1000 mL

## 2022-08-13 MED ORDER — FENTANYL CITRATE (PF) 100 MCG/2ML IJ SOLN: 25.0000 ug | INTRAMUSCULAR | Status: DC | PRN

## 2022-08-13 MED ORDER — BUPIVACAINE LIPOSOME 1.3 % IJ SUSP
INTRAMUSCULAR | Status: DC | PRN
  Administered 2022-08-13: 20 mL

## 2022-08-13 MED ORDER — PHENYLEPHRINE HCL-NACL 20-0.9 MG/250ML-% IV SOLN
INTRAVENOUS | Status: DC | PRN
  Administered 2022-08-13: 40 ug/min via INTRAVENOUS

## 2022-08-13 MED ORDER — VASOPRESSIN 20 UNIT/ML IV SOLN
INTRAVENOUS | Status: DC | PRN
  Administered 2022-08-13 (×3): 1 [IU] via INTRAVENOUS

## 2022-08-13 MED ORDER — SODIUM CHLORIDE 0.9 % IV SOLN: INTRAVENOUS | Status: DC | PRN

## 2022-08-13 MED ORDER — FENTANYL CITRATE (PF) 100 MCG/2ML IJ SOLN
INTRAMUSCULAR | Status: DC | PRN
  Administered 2022-08-13 (×2): 50 ug via INTRAVENOUS

## 2022-08-13 MED ORDER — OXYCODONE HCL 5 MG PO TABS: 5.0000 mg | ORAL_TABLET | Freq: Once | ORAL | Status: DC | PRN

## 2022-08-13 MED ORDER — BUPIVACAINE LIPOSOME 1.3 % IJ SUSP
INTRAMUSCULAR | Status: AC
  Filled 2022-08-13: qty 20

## 2022-08-13 MED ORDER — LIDOCAINE HCL (CARDIAC) PF 100 MG/5ML IV SOSY
PREFILLED_SYRINGE | INTRAVENOUS | Status: DC | PRN
  Administered 2022-08-13: 100 mg via INTRAVENOUS

## 2022-08-13 MED ORDER — ONDANSETRON HCL 4 MG/2ML IJ SOLN
INTRAMUSCULAR | Status: AC
  Filled 2022-08-13: qty 2

## 2022-08-13 MED ORDER — PROPOFOL 10 MG/ML IV BOLUS
INTRAVENOUS | Status: AC
  Filled 2022-08-13: qty 40

## 2022-08-13 MED ORDER — DEXMEDETOMIDINE HCL IN NACL 80 MCG/20ML IV SOLN
INTRAVENOUS | Status: AC
  Filled 2022-08-13: qty 20

## 2022-08-13 MED ORDER — CEFAZOLIN SODIUM-DEXTROSE 2-3 GM-%(50ML) IV SOLR
INTRAVENOUS | Status: DC | PRN
  Administered 2022-08-13: 2 g via INTRAVENOUS

## 2022-08-13 MED ORDER — PHENYLEPHRINE 80 MCG/ML (10ML) SYRINGE FOR IV PUSH (FOR BLOOD PRESSURE SUPPORT)
PREFILLED_SYRINGE | INTRAVENOUS | Status: DC | PRN
  Administered 2022-08-13: 80 ug via INTRAVENOUS
  Administered 2022-08-13: 120 ug via INTRAVENOUS
  Administered 2022-08-13: 80 ug via INTRAVENOUS

## 2022-08-13 MED ORDER — KETAMINE HCL 10 MG/ML IJ SOLN
INTRAMUSCULAR | Status: DC | PRN
  Administered 2022-08-13: 20 mg via INTRAVENOUS

## 2022-08-13 MED ORDER — KETAMINE HCL 50 MG/5ML IJ SOSY
PREFILLED_SYRINGE | INTRAMUSCULAR | Status: AC
  Filled 2022-08-13: qty 5

## 2022-08-13 MED ORDER — ONDANSETRON HCL 4 MG/2ML IJ SOLN
INTRAMUSCULAR | Status: DC | PRN
  Administered 2022-08-13: 4 mg via INTRAVENOUS

## 2022-08-13 MED ORDER — ACETAMINOPHEN 10 MG/ML IV SOLN
INTRAVENOUS | Status: AC
  Filled 2022-08-13: qty 100

## 2022-08-13 MED ORDER — ACETAMINOPHEN 10 MG/ML IV SOLN
INTRAVENOUS | Status: DC | PRN
  Administered 2022-08-13: 1000 mg via INTRAVENOUS

## 2022-08-13 MED ORDER — DEXMEDETOMIDINE HCL IN NACL 80 MCG/20ML IV SOLN
INTRAVENOUS | Status: DC | PRN
  Administered 2022-08-13 (×2): 8 ug via BUCCAL

## 2022-08-13 MED ORDER — OXYCODONE HCL 5 MG/5ML PO SOLN: 5.0000 mg | Freq: Once | ORAL | Status: DC | PRN

## 2022-08-13 MED ORDER — CEFAZOLIN SODIUM 1 G IJ SOLR
INTRAMUSCULAR | Status: AC
  Filled 2022-08-13: qty 20

## 2022-08-13 SURGICAL SUPPLY — 54 items
2108 series sagittal blade IMPLANT
APL PRP STRL LF DISP 70% ISPRP (MISCELLANEOUS) ×1
BAG ISL LRG 20X20 DRWSTRG (DRAPES) ×1
BAG ISOLATATION DRAPE 20X20 ST (DRAPES) IMPLANT
BLADE SAGITTAL WIDE XTHICK NO (BLADE) IMPLANT
BLADE SAW SAG 29X58X.64 (BLADE) IMPLANT
BNDG CMPR 5X4 CHSV STRCH STRL (GAUZE/BANDAGES/DRESSINGS) ×1
BNDG CMPR 5X4 KNIT ELC UNQ LF (GAUZE/BANDAGES/DRESSINGS) ×1
BNDG CMPR 75X41 PLY HI ABS (GAUZE/BANDAGES/DRESSINGS) ×1
BNDG CMPR STD VLCR NS LF 5.8X6 (GAUZE/BANDAGES/DRESSINGS) ×1
BNDG COHESIVE 4X5 TAN STRL LF (GAUZE/BANDAGES/DRESSINGS) ×1 IMPLANT
BNDG ELASTIC 4INX 5YD STR LF (GAUZE/BANDAGES/DRESSINGS) IMPLANT
BNDG ELASTIC 6X5.8 VLCR NS LF (GAUZE/BANDAGES/DRESSINGS) ×1 IMPLANT
BNDG GAUZE DERMACEA FLUFF 4 (GAUZE/BANDAGES/DRESSINGS) ×2 IMPLANT
BNDG GZE DERMACEA 4 6PLY (GAUZE/BANDAGES/DRESSINGS) ×2
BNDG STRETCH 4X75 STRL LF (GAUZE/BANDAGES/DRESSINGS) IMPLANT
CHLORAPREP W/TINT 26 (MISCELLANEOUS) ×1 IMPLANT
DRAPE INCISE IOBAN 66X45 STRL (DRAPES) ×1 IMPLANT
DRAPE INCISE IOBAN 66X60 STRL (DRAPES) ×1 IMPLANT
DRSG XEROFORM 1X8 (GAUZE/BANDAGES/DRESSINGS) IMPLANT
ELECT CAUTERY BLADE 6.4 (BLADE) ×1 IMPLANT
ELECT REM PT RETURN 9FT ADLT (ELECTROSURGICAL) ×1
ELECTRODE REM PT RTRN 9FT ADLT (ELECTROSURGICAL) ×1 IMPLANT
GAUZE SPONGE 4X4 12PLY STRL (GAUZE/BANDAGES/DRESSINGS) IMPLANT
GAUZE XEROFORM 1X8 LF (GAUZE/BANDAGES/DRESSINGS) ×2 IMPLANT
GLOVE BIO SURGEON STRL SZ7 (GLOVE) ×2 IMPLANT
GOWN STRL REUS W/ TWL LRG LVL3 (GOWN DISPOSABLE) ×2 IMPLANT
GOWN STRL REUS W/ TWL XL LVL3 (GOWN DISPOSABLE) ×1 IMPLANT
GOWN STRL REUS W/TWL LRG LVL3 (GOWN DISPOSABLE) ×1
GOWN STRL REUS W/TWL XL LVL3 (GOWN DISPOSABLE) ×1
HANDLE YANKAUER SUCT BULB TIP (MISCELLANEOUS) ×1 IMPLANT
KIT TURNOVER KIT A (KITS) ×1 IMPLANT
LABEL OR SOLS (LABEL) ×1 IMPLANT
MANIFOLD NEPTUNE II (INSTRUMENTS) ×1 IMPLANT
MAT ABSORB  FLUID 56X50 GRAY (MISCELLANEOUS) ×1
MAT ABSORB FLUID 56X50 GRAY (MISCELLANEOUS) ×1 IMPLANT
NS IRRIG 1000ML POUR BTL (IV SOLUTION) ×1 IMPLANT
PACK EXTREMITY ARMC (MISCELLANEOUS) ×1 IMPLANT
PAD ABD DERMACEA PRESS 5X9 (GAUZE/BANDAGES/DRESSINGS) ×2 IMPLANT
PAD PREP 24X41 OB/GYN DISP (PERSONAL CARE ITEMS) ×1 IMPLANT
SPONGE T-LAP 18X18 ~~LOC~~+RFID (SPONGE) ×2 IMPLANT
STAPLER SKIN PROX 35W (STAPLE) ×1 IMPLANT
STOCKINETTE M/LG 89821 (MISCELLANEOUS) ×1 IMPLANT
SUT SILK 2 0 (SUTURE) ×1
SUT SILK 2 0 SH (SUTURE) ×2 IMPLANT
SUT SILK 2-0 18XBRD TIE 12 (SUTURE) ×1 IMPLANT
SUT VIC AB 0 CT1 36 (SUTURE) ×2 IMPLANT
SUT VIC AB 2-0 CT1 (SUTURE) ×2 IMPLANT
SUT VIC AB 3-0 SH 27 (SUTURE) ×2
SUT VIC AB 3-0 SH 27X BRD (SUTURE) IMPLANT
TOWEL OR 17X26 4PK STRL BLUE (TOWEL DISPOSABLE) IMPLANT
TRAP FLUID SMOKE EVACUATOR (MISCELLANEOUS) ×1 IMPLANT
WATER STERILE IRR 500ML POUR (IV SOLUTION) ×1 IMPLANT
sterile bag IMPLANT

## 2022-08-13 NOTE — Progress Notes (Signed)
Patient transported to OR in bed with transport in stable condition at 0740.  Patient returned from PACU in stable condition.  Patient side-lying on right side, LLE residual extremity wrapped in gauze and betadine.  Patient denies pain.

## 2022-08-13 NOTE — Anesthesia Postprocedure Evaluation (Signed)
Anesthesia Post Note  Patient: [name] [name]  Procedure(s) Performed: AMPUTATION ABOVE KNEE (Left: Knee)  Patient location during evaluation: PACU Anesthesia Type: General Level of consciousness: awake and alert Pain management: pain level controlled Vital Signs Assessment: post-procedure vital signs reviewed and stable Respiratory status: spontaneous breathing, nonlabored ventilation, respiratory function stable and patient connected to nasal cannula oxygen Cardiovascular status: blood pressure returned to baseline and stable Postop Assessment: no apparent nausea or vomiting Anesthetic complications: no   No notable events documented.   Last Vitals:  Vitals:   08/13/22 1038 08/13/22 1045  BP: 135/74 139/70  Pulse: (!) 46 69  Resp: (!) 21 14  Temp:  36.8 C  SpO2: 100% 98%    Last Pain:  Vitals:   08/13/22 1045  TempSrc:   PainSc: 0-No pain                 Louie Boston

## 2022-08-13 NOTE — Transfer of Care (Signed)
Immediate Anesthesia Transfer of Care Note  Patient: [name] [name]  Procedure(s) Performed: AMPUTATION ABOVE KNEE (Left: Knee)  Patient Location: PACU  Anesthesia Type:General  Level of Consciousness: drowsy  Airway & Oxygen Therapy: Patient Spontanous Breathing and Patient connected to face mask oxygen  Post-op Assessment: Report given to RN, Post -op Vital signs reviewed and stable, and Patient moving all extremities  Post vital signs: Reviewed and stable  Last Vitals:  Vitals Value Taken Time  BP 135/74 08/13/22 1038  Temp 36.9 C 08/13/22 1030  Pulse 58 08/13/22 1038  Resp 13 08/13/22 1039  SpO2 100 % 08/13/22 1038  Vitals shown include unvalidated device data.  Last Pain:  Vitals:   08/13/22 1030  TempSrc:   PainSc: Asleep         Complications: No notable events documented.

## 2022-08-13 NOTE — Op Note (Signed)
  [facility]   OPERATIVE NOTE   PROCEDURE:  Left above-the-knee amputation  PRE-OPERATIVE DIAGNOSIS: Left foot gangrene  POST-OPERATIVE DIAGNOSIS: same as above  SURGEON:  Eli Hose, MD  ASSISTANT(S): none  ANESTHESIA: general  ESTIMATED BLOOD LOSS: 50 cc  FINDING(S): Gangrene of LEFT foot; good bleeding at SFA and tissues  SPECIMEN(S):  Left above-the-knee amputation  INDICATIONS:   [name] [name] is a 82 y.o. male who presents with left FOOT gangrene.  The patient is scheduled for a left above-the-knee amputation.  I discussed in depth with the patient the risks, benefits, and alternatives to this procedure.  The patient is aware that the risk of this operation included but are not limited to:  bleeding, infection, myocardial infarction, stroke, death, failure to heal amputation wound, and possible need for more proximal amputation.  The patient is aware of the risks and agrees proceed forward with the procedure.  DESCRIPTION: After full informed written consent was obtained from the patient, the patient was taken to the operating room, and placed supine upon the operating table.  Prior to induction, the patient received IV antibiotics.  The patient was then prepped and draped in the standard fashion for a left above-the-knee amputation.  After obtaining adequate anesthesia, the patient was prepped and draped in the standard fashion for a above-the-knee amputation.  I marked out the anterior and posterior flaps for a fish-mouth type of amputation.  I made the incisions for these flaps, and then dissected through the subcutaneous tissue, fascia, and muscles circumferentially.  I elevated  the periosteal tissue 4-5 cm more proximal than the anterior skin flap.  I then transected the femur with a power saw at this level.  Then I smoothed out the rough edges of the bone.  At this point, the specimen was passed off the field as the above-the-knee amputation.  At  this point, I clamped all visibly bleeding arteries and veins using a combination of suture ligation with silk suture and electrocautery.   Bleeding continued to be controlled with electrocautery and suture ligature.  The stump was washed off with sterile normal saline and no further active bleeding was noted.  I reapproximated the anterior and posterior fascia  with interrupted stitches of 0 Vicryl.  This was completed along the entire length of anterior and posterior fascia until there were no more loose space in the fascial line. The subcutaneous tissue was then approximated with 2-0 vicryl sutures. The skin was then  reapproximated with staples.  The stump was washed off and dried.  The incision was dressed with Xeroform and ABD pads, and  then fluffs were applied.  Kerlix was wrapped around the leg and then gently an ACE wrap was applied.  A large Ioban was then placed over the ACE wrap to secure the dressing. The patient was then awakened and take to the recovery room in stable condition.   COMPLICATIONS: none  CONDITION: stable  Lyal Husted A  08/13/2022, 10:33 AM   This note was created with Dragon Medical transcription system. Any errors in dictation are purely unintentional.

## 2022-08-13 NOTE — Anesthesia Preprocedure Evaluation (Signed)
Anesthesia Evaluation  Patient identified by MRN, date of birth, ID band Patient awake    Reviewed: Allergy & Precautions, NPO status , Patient's Chart, lab work & pertinent test results  History of Anesthesia Complications Negative for: history of anesthetic complications  Airway Mallampati: II  TM Distance: >3 FB Neck ROM: full    Dental  (+) Edentulous Lower, Edentulous Upper   Pulmonary neg pulmonary ROS   Pulmonary exam normal        Cardiovascular hypertension, + Peripheral Vascular Disease and +CHF  + dysrhythmias Atrial Fibrillation      Neuro/Psych  Neuromuscular disease  negative psych ROS   GI/Hepatic Neg liver ROS,GERD  Medicated,,  Endo/Other  negative endocrine ROSdiabetes, Well Controlled, Type 2    Renal/GU Renal disease     Musculoskeletal   Abdominal   Peds  Hematology  (+) Blood dyscrasia, anemia INR 1.6    Anesthesia Other Findings Past Medical History: No date: CHF (congestive heart failure) No date: Diabetes No date: Dysrhythmia     Comment:  AFIB No date: HTN (hypertension) No date: Infection     Comment:  LEG No date: Pancreatitis  Past Surgical History: 03/09/2021: AMPUTATION; Right     Comment:  Procedure: AMPUTATION FOOT;  Surgeon: Annice Needy, MD;               Location: [facility];  Service: General;  Laterality:               Right; 03/11/2021: AMPUTATION; Right     Comment:  Procedure: AMPUTATION ABOVE KNEE;  Surgeon: Annice Needy, MD;  Location: [facility];  Service: General;                Laterality: Right; 03/08/2017: CATARACT EXTRACTION W/PHACO; Right     Comment:  Procedure: CATARACT EXTRACTION PHACO AND INTRAOCULAR               LENS PLACEMENT (IOC);  Surgeon: Nevada Crane, MD;                Location: [facility];  Service: Ophthalmology;  Laterality:              Right;  Lot# [id number] H [place]: 00:30.9 AP%: 8.2 CDE: 2.54 04/04/2017: CATARACT EXTRACTION  W/PHACO; Left     Comment:  Procedure: CATARACT EXTRACTION PHACO AND INTRAOCULAR               LENS PLACEMENT (IOC);  Surgeon: Nevada Crane, MD;                Location: [facility];  Service: Ophthalmology;  Laterality:              Left;  [place] 00:31.4 AP% 12.0 CDE 3.78 Fluid Pack lot #               [id number] No date: CHOLECYSTECTOMY No date: EYE SURGERY     Comment:  right eye 02/10/2021: LOWER EXTREMITY ANGIOGRAPHY; Right     Comment:  Procedure: LOWER EXTREMITY ANGIOGRAPHY;  Surgeon: Annice Needy, MD;  Location: [facility];  Service:               Cardiovascular;  Laterality: Right; 07/03/2022: LOWER EXTREMITY ANGIOGRAPHY; Left     Comment:  Procedure: Lower Extremity Angiography;  Surgeon: Wyn Quaker,  Marlow Baars, MD;  Location: [facility];  Service:               Cardiovascular;  Laterality: Left; 07/24/2022: LOWER EXTREMITY ANGIOGRAPHY; Left     Comment:  Procedure: Lower Extremity Angiography;  Surgeon: Annice Needy, MD;  Location: [facility];  Service:               Cardiovascular;  Laterality: Left;  BMI    Body Mass Index: 26.64 kg/m      Reproductive/Obstetrics negative OB ROS                             Anesthesia Physical Anesthesia Plan  ASA: 3  Anesthesia Plan: General LMA   Post-op Pain Management:    Induction: Intravenous  PONV Risk Score and Plan: 2 and Dexamethasone, Ondansetron and Treatment may vary due to age or medical condition  Airway Management Planned: LMA  Additional Equipment:   Intra-op Plan:   Post-operative Plan: Extubation in OR  Informed Consent: I have reviewed the patients History and Physical, chart, labs and discussed the procedure including the risks, benefits and alternatives for the proposed anesthesia with the patient or authorized representative who has indicated his/her understanding and acceptance.     Dental Advisory Given  Plan  Discussed with: Anesthesiologist, CRNA and Surgeon  Anesthesia Plan Comments: (Patient consented for risks of anesthesia including but not limited to:  - adverse reactions to medications - damage to eyes, teeth, lips or other oral mucosa - nerve damage due to positioning  - sore throat or hoarseness - Damage to heart, brain, nerves, lungs, other parts of body or loss of life  Patient voiced understanding.)       Anesthesia Quick Evaluation

## 2022-08-13 NOTE — Anesthesia Procedure Notes (Signed)
Procedure Name: LMA Insertion Date/Time: 08/13/2022 9:14 AM  Performed by: Katherine Basset, CRNAPre-anesthesia Checklist: Patient identified, Emergency Drugs available, Suction available and Patient being monitored Patient Re-evaluated:Patient Re-evaluated prior to induction Oxygen Delivery Method: Circle system utilized Preoxygenation: Pre-oxygenation with 100% oxygen Induction Type: IV induction LMA: LMA inserted LMA Size: 5.0 Number of attempts: 1 Airway Equipment and Method: Oral airway Placement Confirmation: positive ETCO2 and breath sounds checked- equal and bilateral Tube secured with: Tape Dental Injury: Teeth and Oropharynx as per pre-operative assessment  Comments: Igel 5 used w/o difficulty

## 2022-08-13 NOTE — Progress Notes (Addendum)
[facility]  - [facility]   PATIENT NAME: [name] [name]    MR#:  [id number]  DATE OF BIRTH:  [birth year]  SUBJECTIVE:  Wife and grand dter at bedside. Patient denies any dysuria. Not the best historian. No new complaints Pt for amputation today  VITALS:  Blood pressure 139/70, pulse 69, temperature 98.2 F (36.8 C), resp. rate 14, height 6' (1.829 m), weight 89.1 kg, SpO2 98 %.  PHYSICAL EXAMINATION:   GENERAL:  82 y.o.-year-old patient with no acute distress.  LUNGS: Normal breath sounds bilaterally, no wheezing CARDIOVASCULAR: S1, S2 normal. No murmur   ABDOMEN: Soft, nontender, nondistended. Bowel sounds present.  EXTREMITIES:  NEUROLOGIC: nonfocal  patient is alert and awake SKIN: as above  LABORATORY PANEL:  CBC Recent Labs  Lab 08/13/22 0719  WBC 20.6*  HGB 9.0*  HCT 25.4*  PLT 193     Chemistries  Recent Labs  Lab 08/09/22 2323 08/10/22 0545 08/13/22 0719  NA 132*   < > 134*  K 4.5   < > 3.4*  CL 98   < > 102  CO2 23   < > 23  GLUCOSE 179*   < > 85  BUN 13   < > 15  CREATININE 1.72*   < > 1.39*  CALCIUM 8.6*   < > 8.2*  AST 29  --   --   ALT 10  --   --   ALKPHOS 87  --   --   BILITOT 2.0*  --   --    < > = values in this interval not displayed.    Cardiac Enzymes No results for input(s): "TROPONINI" in the last 168 hours. RADIOLOGY:  PERIPHERAL VASCULAR CATHETERIZATION  Result Date: 08/11/2022 See surgical note for result.   Assessment and Plan  [name] [name] is a 82 y.o. male brought to the ED via EMS from home with a chief complaint of generalized weakness and fatigue.  Patient with a history of CHF, atrial fibrillation on Xarelto, on Digoxin.  Wife states patient has not been eating well for the past 2 days    Severe sepsis secondary to left second toe gangrene/infection with foul-smelling discharge/?OM severe peripheral vascular disease history of right BKA  -- patient came in with generalized weakness,  fatigue, hypertension, leukocytosis, elevated lactic acid 3.7--1.7, foul-smelling left second total -- received IV fluids per sepsis protocol. -- Patient was started on broad-spectrum antibiotic with vancomycin, Flagyl,cefepime-->cont cefepime -- blood pressure much improved. Lactic acid normalized. No fever. -- Podiatry consultation with Dr. Wilber Bihari fowler--no need for MRI  Severe peripheral arterial disease -- patient underwent left lower extremity angiogram in March and April per family -- Dr. Wyn Quaker had to hold off on angiogram lower extremity due to elevated INR. Patient on Xarelto. -- Xarelto on hold -- INR 1.6.  -- s/p Angiogram lower extremity:08/11/22  Aortogram:  This demonstrated normal renal arteries and normal aorta and iliac segments without significant stenosis  Left Lower Extremity:  This demonstrated fairly normal common femoral artery, superficial femoral artery, and popliteal artery with very mild disease which was not flow-limiting.  The posterior tibial artery that had been treated several weeks ago was open and patent into the foot providing good flow into the foot.  The peroneal artery was small and did not really provide flow into the foot.  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 ---Plans for left LE amputation today by dr Evie Lacks  Type II diabetes, hypoglycemia, peripheral neuropathy -- A1c 5.5 -- sliding scale insulin -- continue Neurontin -- received IV dextrose drip. Sugars now stable  Hypertension -- continue home meds  Hyperlipidemia -- continue statins  Hematuria in the setting of INR 4.5 (on xarelto) --send urine culture--NO growth --monitor H and H  --consider urology consult if symptoms worsen   Procedures: left LE angiogram Family communication :wife Pattricia Boss at bedside Consults : podiatry, vascular CODE STATUS: full DVT Prophylaxis :Lovenox Level of care: Telemetry Medical Status is:  Inpatient Remains inpatient appropriate because: sepsis, left 2nd toe gangrene For amputation today    TOTAL TIME TAKING CARE OF THIS PATIENT: 35 minutes.  >50% time spent on counselling and coordination of care  Note: This dictation was prepared with Dragon dictation along with smaller phrase technology. Any transcriptional errors that result from this process are unintentional.  Enedina Finner M.D    [facility]   CC: Primary care physician; Luciana Axe, NP

## 2022-08-14 ENCOUNTER — Encounter: Payer: Self-pay | Admitting: Vascular Surgery

## 2022-08-14 ENCOUNTER — Other Ambulatory Visit: Payer: Self-pay

## 2022-08-14 DIAGNOSIS — I96 Gangrene, not elsewhere classified: Secondary | ICD-10-CM | POA: Diagnosis not present

## 2022-08-14 DIAGNOSIS — A419 Sepsis, unspecified organism: Secondary | ICD-10-CM | POA: Diagnosis not present

## 2022-08-14 DIAGNOSIS — E1142 Type 2 diabetes mellitus with diabetic polyneuropathy: Secondary | ICD-10-CM | POA: Diagnosis not present

## 2022-08-14 DIAGNOSIS — N179 Acute kidney failure, unspecified: Secondary | ICD-10-CM | POA: Diagnosis not present

## 2022-08-14 LAB — COMPREHENSIVE METABOLIC PANEL
ALT: 7 U/L (ref 0–44)
AST: 21 U/L (ref 15–41)
Albumin: 1.8 g/dL — ABNORMAL LOW (ref 3.5–5.0)
Alkaline Phosphatase: 77 U/L (ref 38–126)
Anion gap: 8 (ref 5–15)
BUN: 16 mg/dL (ref 8–23)
CO2: 24 mmol/L (ref 22–32)
Calcium: 8 mg/dL — ABNORMAL LOW (ref 8.9–10.3)
Chloride: 105 mmol/L (ref 98–111)
Creatinine, Ser: 1.36 mg/dL — ABNORMAL HIGH (ref 0.61–1.24)
GFR, Estimated: 53 mL/min — ABNORMAL LOW (ref >60)
Glucose, Bld: 108 mg/dL — ABNORMAL HIGH (ref 70–99)
Potassium: 3.6 mmol/L (ref 3.5–5.1)
Sodium: 137 mmol/L (ref 135–145)
Total Bilirubin: 0.9 mg/dL (ref 0.3–1.2)
Total Protein: 6 g/dL — ABNORMAL LOW (ref 6.5–8.1)

## 2022-08-14 LAB — CULTURE, BLOOD (ROUTINE X 2)
Culture: NO GROWTH
Culture: NO GROWTH

## 2022-08-14 LAB — CBC
HCT: 25.1 % — ABNORMAL LOW (ref 39.0–52.0)
Hemoglobin: 8.7 g/dL — ABNORMAL LOW (ref 13.0–17.0)
MCH: 28.3 pg (ref 26.0–34.0)
MCHC: 34.7 g/dL (ref 30.0–36.0)
MCV: 81.8 fL (ref 80.0–100.0)
Platelets: 192 10*3/uL (ref 150–400)
RBC: 3.07 MIL/uL — ABNORMAL LOW (ref 4.22–5.81)
RDW: 14.4 % (ref 11.5–15.5)
WBC: 14.4 10*3/uL — ABNORMAL HIGH (ref 4.0–10.5)
nRBC: 0 % (ref 0.0–0.2)

## 2022-08-14 LAB — GLUCOSE, CAPILLARY
Glucose-Capillary: 115 mg/dL — ABNORMAL HIGH (ref 70–99)
Glucose-Capillary: 116 mg/dL — ABNORMAL HIGH (ref 70–99)
Glucose-Capillary: 139 mg/dL — ABNORMAL HIGH (ref 70–99)
Glucose-Capillary: 156 mg/dL — ABNORMAL HIGH (ref 70–99)
Glucose-Capillary: 54 mg/dL — ABNORMAL LOW (ref 70–99)
Glucose-Capillary: 73 mg/dL (ref 70–99)
Glucose-Capillary: 99 mg/dL (ref 70–99)

## 2022-08-14 MED ORDER — INSULIN ASPART 100 UNIT/ML IJ SOLN: 0.0000 [IU] | Freq: Every day | INTRAMUSCULAR | Status: DC

## 2022-08-14 MED ORDER — INSULIN ASPART 100 UNIT/ML IJ SOLN
0.0000 [IU] | Freq: Three times a day (TID) | INTRAMUSCULAR | Status: DC
  Administered 2022-08-14: 3 [IU] via SUBCUTANEOUS
  Filled 2022-08-14: qty 1

## 2022-08-14 NOTE — Evaluation (Signed)
Physical Therapy Evaluation Patient Details Name: [name] [name] MRN: [id number] DOB: 1940-05-05 Today's Date: 08/14/2022  History of Present Illness  This is a 82 y.o. male  African-American male with medical history significant for R AKA, CHF, type 2 diabetes mellitus, PAD, hypertension and pancreatitis, who presented to the emergency room with acute onset of generalized weakness with significant anorexia over the last couple of days. Vascular surgery was consulted for left lower extremity gangrene toes and open sores. Vascular attempted angiogram procedure on 4/19, however this was unsuccessful and pt is now s/p L AKA.  Clinical Impression  Pt is a pleasant 82 year old male who was admitted for sepsis, gangrene of L foot and resulted in amputation on 08/13/22. Pt is now B AKA. Pt performs bed mobility with max assist +2 and unable to perform further transfers/ambulation at this time. Pt demonstrates deficits with cognition/mobility/pain. Would benefit from skilled PT to address above deficits and promote optimal return to PLOF. Pt will continue to receive skilled PT services while admitted and will defer to TOC/care team for updates regarding disposition planning.      Recommendations for follow up therapy are one component of a multi-disciplinary discharge planning process, led by the attending physician.  Recommendations may be updated based on patient status, additional functional criteria and insurance authorization.  Follow Up Recommendations Can patient physically be transported by private vehicle: No     Assistance Recommended at Discharge Frequent or constant Supervision/Assistance  Patient can return home with the following  Two people to help with walking and/or transfers;Two people to help with bathing/dressing/bathroom;Assist for transportation    Equipment Recommendations  (TBD)  Recommendations for Other Services       Functional Status Assessment Patient has had a recent  decline in their functional status and demonstrates the ability to make significant improvements in function in a reasonable and predictable amount of time.     Precautions / Restrictions Precautions Precautions: Fall Restrictions Weight Bearing Restrictions: Yes LLE Weight Bearing: Non weight bearing Other Position/Activity Restrictions: Hx of R AKA      Mobility  Bed Mobility Overal bed mobility: Needs Assistance Bed Mobility: Rolling, Sit to Supine, Supine to Sit Rolling: Mod assist, +2 for physical assistance   Supine to sit: Max assist, +2 for physical assistance Sit to supine: Max assist, +2 for physical assistance, Total assist   General bed mobility comments: able to follow commands for sequencing. Practiced rolling to B sides. Needs assist for sliding/repositioning once supine. Once seated, needs downward pressure on R hip to maintain seated upright balance. Able to progress to sitting with close supervision. Stayed at bedside for extended time    Transfers                   General transfer comment: NT this date    Ambulation/Gait                  Stairs            Wheelchair Mobility    Modified Rankin (Stroke Patients Only)       Balance Overall balance assessment: Needs assistance Sitting-balance support: Bilateral upper extremity supported, Single extremity supported, Feet unsupported Sitting balance-Leahy Scale: Fair Sitting balance - Comments: Requires intermittent MIN A to maintain sitting balance at EOB. Able to progress to close CGA by end of session.  Pertinent Vitals/Pain Pain Assessment Pain Assessment: 0-10 Pain Score: 7  Pain Location: LLE Pain Descriptors / Indicators: Aching, Sore, Grimacing Pain Intervention(s): Limited activity within patient's tolerance, Repositioned, Premedicated before session    Home Living Family/patient expects to be discharged to:: Private  residence Living Arrangements: Spouse/significant other;Children Available Help at Discharge: Family Type of Home: House Home Access: Ramped entrance       Home Layout: One level Home Equipment: Insurance risk surveyor (2 wheels);Rollator (4 wheels);BSC/3in1      Prior Function Prior Level of Function : Needs assist             Mobility Comments: Generally uses power scooter he borrowed from a friend, was able to SPT to/from power scooter. Reports staying in his recliner most of the time. ADLs Comments: Sleeping in a recliner, uses a lift chair to help him transfer, Wife assists with ADL management including toileting, sponge bathing, and dressing.     Hand Dominance   Dominant Hand: Right    Extremity/Trunk Assessment   Upper Extremity Assessment Upper Extremity Assessment: Generalized weakness (B UE grossly 4-/5)    Lower Extremity Assessment Lower Extremity Assessment: Generalized weakness (R LE at least 3+/5; L LE grossly 3/5) RLE Deficits / Details: Hx of R AKA LLE Deficits / Details: s/p L AKA       Communication   Communication: HOH  Cognition Arousal/Alertness: Awake/alert Behavior During Therapy: WFL for tasks assessed/performed Overall Cognitive Status: Difficult to assess                                 General Comments: Oriented to self/place, some difficulty noted with sequencing multi-step instructions and decreased safety awareness spouse at bedside does not inidicate concerns.        General Comments      Exercises Other Exercises Other Exercises: educated on positioning in bed without pillows under surgical leg. Assisted in rolling to B sides for incontient episode including need for total assist for hygiene and new linen replacement. Other Exercises: Once seated at EOB, performed weight shifts with intermittent downward pressure on R upper thigh.   Assessment/Plan    PT Assessment Patient needs continued PT services   PT Problem List Decreased strength;Decreased balance;Decreased mobility;Decreased cognition;Decreased knowledge of use of DME;Decreased safety awareness;Pain       PT Treatment Interventions DME instruction;Gait training;Therapeutic activities;Therapeutic exercise;Balance training;Cognitive remediation;Wheelchair mobility training    PT Goals (Current goals can be found in the Care Plan section)  Acute Rehab PT Goals Patient Stated Goal: to go home PT Goal Formulation: With patient Time For Goal Achievement: 08/28/22 Potential to Achieve Goals: Good    Frequency Min 4X/week     Co-evaluation PT/OT/SLP Co-Evaluation/Treatment: Yes Reason for Co-Treatment: Necessary to address cognition/behavior during functional activity;For patient/therapist safety;To address functional/ADL transfers PT goals addressed during session: Mobility/safety with mobility;Balance OT goals addressed during session: ADL's and self-care       AM-PAC PT "6 Clicks" Mobility  Outcome Measure Help needed turning from your back to your side while in a flat bed without using bedrails?: Total Help needed moving from lying on your back to sitting on the side of a flat bed without using bedrails?: A Lot Help needed moving to and from a bed to a chair (including a wheelchair)?: Total Help needed standing up from a chair using your arms (e.g., wheelchair or bedside chair)?: Total Help needed to walk in hospital room?:  Total Help needed climbing 3-5 steps with a railing? : Total 6 Click Score: 7    End of Session   Activity Tolerance: Patient tolerated treatment well Patient left: in bed;with bed alarm set;with family/visitor present Nurse Communication: Mobility status PT Visit Diagnosis: Muscle weakness (generalized) (M62.81);Difficulty in walking, not elsewhere classified (R26.2);Pain Pain - Right/Left: Left Pain - part of body: Leg    Time: 1610-9604 PT Time Calculation (min) (ACUTE ONLY): 36  min   Charges:   PT Evaluation $PT Eval Moderate Complexity: 1 Mod PT Treatments $Therapeutic Activity: 8-22 mins        Elizabeth Palau, PT, DPT, GCS [phone]   Donnelle Rubey 08/14/2022, 2:47 PM

## 2022-08-14 NOTE — Evaluation (Signed)
Occupational Therapy Evaluation Patient Details Name: [name] [name] MRN: [id number] DOB: [birth year] Today's Date: 08/14/2022   History of Present Illness This is a 82 y.o. male  African-American male with medical history significant for R AKA, CHF, type 2 diabetes mellitus, PAD, hypertension and pancreatitis, who presented to the emergency room with acute onset of generalized weakness with significant anorexia over the last couple of days. Vascular surgery was consulted for left lower extremity gangrene toes and open sores. Vascular attempted angiogram procedure on 4/19, however this was unsuccessful and pt is now s/p L AKA.   Clinical Impression   [name] [name] was seen for OT evaluation this date. Prior to hospital admission, pt was residing at home with his spouse. Pt reports using a lift chair to facilitate transfers to/from a borrowed mobility scooter. He states he is able to transfer on/off the commode when needed but that his spouse assists him with bathing and dressing. Pt presents to acute OT demonstrating impaired ADL performance and functional mobility 2/2 increased pain in his LLE with mobility, decreased functional use of BLE, and decreased activity tolerance (See OT problem list for additional functional deficits). Pt currently requires MAX A +2 to come to sitting at EOB, MIN A +2 for sitting balance and to perform UB grooming/dressing at EOB.  Pt would benefit from skilled OT services to address noted impairments and functional limitations (see below for any additional details) in order to maximize safety and independence while minimizing falls risk and caregiver burden.       Recommendations for follow up therapy are one component of a multi-disciplinary discharge planning process, led by the attending physician.  Recommendations may be updated based on patient status, additional functional criteria and insurance authorization.   Assistance Recommended at Discharge Frequent or constant  Supervision/Assistance  Patient can return home with the following A lot of help with bathing/dressing/bathroom;Two people to help with walking and/or transfers;Help with stairs or ramp for entrance;Assist for transportation;Assistance with cooking/housework    Functional Status Assessment  Patient has had a recent decline in their functional status and demonstrates the ability to make significant improvements in function in a reasonable and predictable amount of time.  Equipment Recommendations  BSC/3in1    Recommendations for Other Services       Precautions / Restrictions Precautions Precautions: Fall Restrictions Weight Bearing Restrictions: Yes LLE Weight Bearing: Non weight bearing (L AKA) Other Position/Activity Restrictions: Hx of R AKA      Mobility Bed Mobility Overal bed mobility: Needs Assistance Bed Mobility: Rolling, Sit to Supine, Supine to Sit Rolling: Mod assist   Supine to sit: Max assist, +2 for physical assistance Sit to supine: Max assist, +2 for physical assistance, Total assist        Transfers                          Balance Overall balance assessment: Needs assistance Sitting-balance support: Bilateral upper extremity supported, Single extremity supported, Feet unsupported Sitting balance-Leahy Scale: Fair Sitting balance - Comments: Requires intermittent MIN A to maintain sitting balance at EOB. Able to progress to close CGA by end of session.                                   ADL either performed or assessed with clinical judgement   ADL Overall ADL's : Needs assistance/impaired  General ADL Comments: Significantly functionally limited by increased pain, decreased functional use of BLE, and decreased activity tolerance. Requires MAX A for bed mobility, MIN A for sitting balance at EOB, MOD A for LB ADL Management at bed level.     Vision Patient Visual Report:  No change from baseline       Perception     Praxis      Pertinent Vitals/Pain Pain Assessment Pain Assessment: 0-10 Pain Score: 7  Pain Location: LLE Pain Descriptors / Indicators: Aching, Sore, Grimacing Pain Intervention(s): Limited activity within patient's tolerance, Monitored during session, Repositioned     Hand Dominance Right   Extremity/Trunk Assessment Upper Extremity Assessment Upper Extremity Assessment: Generalized weakness   Lower Extremity Assessment Lower Extremity Assessment: LLE deficits/detail;Defer to PT evaluation;RLE deficits/detail RLE Deficits / Details: Hx of R AKA LLE Deficits / Details: s/p L AKA       Communication Communication Communication: HOH   Cognition Arousal/Alertness: Awake/alert Behavior During Therapy: WFL for tasks assessed/performed Overall Cognitive Status: Difficult to assess                                 General Comments: Oriented to self/place, some difficulty noted with sequencing multi-step instructions and decreased safety awareness spouse at bedside does not inidicate concerns.     General Comments       Exercises Other Exercises Other Exercises: Pt/caregiver educated on role of OT in acute setting, safety/falls prevention strategies, residual limb management techniques including gentle self-massage, hip flexor stretching, and positioning strategies.   Shoulder Instructions      Home Living Family/patient expects to be discharged to:: Private residence Living Arrangements: Spouse/significant other;Children Available Help at Discharge: Family Type of Home: House Home Access: Ramped entrance     Home Layout: One level     Bathroom Shower/Tub: Sponge bathes at baseline         Home Equipment: Insurance risk surveyor (2 wheels);Rollator (4 wheels);BSC/3in1          Prior Functioning/Environment Prior Level of Function : Needs assist             Mobility Comments:  Generally uses power scooter he borrowed from a friend, was able to SPT to/from power scooter. Reports staying in his recliner most of the time. ADLs Comments: Sleeping in a recliner, uses a lift chair to help him transfer, Wife assists with ADL management including toileting, sponge bathing, and dressing.        OT Problem List: Decreased strength;Decreased coordination;Pain;Decreased range of motion;Decreased activity tolerance;Decreased safety awareness;Impaired balance (sitting and/or standing);Decreased knowledge of use of DME or AE;Decreased knowledge of precautions;Decreased cognition      OT Treatment/Interventions: Self-care/ADL training;Therapeutic exercise;Therapeutic activities;DME and/or AE instruction;Patient/family education;Balance training;Energy conservation    OT Goals(Current goals can be found in the care plan section) Acute Rehab OT Goals Patient Stated Goal: To go home OT Goal Formulation: With patient Time For Goal Achievement: 08/28/22 Potential to Achieve Goals: Good ADL Goals Pt Will Perform Grooming: sitting;with set-up;with supervision Pt Will Perform Lower Body Dressing: sitting/lateral leans;with min assist;with adaptive equipment Pt Will Transfer to Toilet: bedside commode;with transfer board;anterior/posterior transfer;with min assist Pt Will Perform Toileting - Clothing Manipulation and hygiene: sitting/lateral leans;with adaptive equipment;with supervision;with set-up  OT Frequency: Min 3X/week    Co-evaluation PT/OT/SLP Co-Evaluation/Treatment: Yes Reason for Co-Treatment: Necessary to address cognition/behavior during functional activity;For patient/therapist safety;To address functional/ADL transfers PT goals addressed during session:  Mobility/safety with mobility;Balance OT goals addressed during session: ADL's and self-care      AM-PAC OT "6 Clicks" Daily Activity     Outcome Measure Help from another person eating meals?: A Little Help from  another person taking care of personal grooming?: A Little Help from another person toileting, which includes using toliet, bedpan, or urinal?: A Lot Help from another person bathing (including washing, rinsing, drying)?: A Lot Help from another person to put on and taking off regular upper body clothing?: A Lot Help from another person to put on and taking off regular lower body clothing?: A Lot 6 Click Score: 14   End of Session Equipment Utilized During Treatment: Gait belt;Rolling walker (2 wheels)  Activity Tolerance: Patient tolerated treatment well Patient left: in bed;with call bell/phone within reach;with bed alarm set;with family/visitor present  OT Visit Diagnosis: Other abnormalities of gait and mobility (R26.89);Muscle weakness (generalized) (M62.81);Pain Pain - Right/Left: Left Pain - part of body: Knee;Leg                Time: 1610-9604 OT Time Calculation (min): 41 min Charges:  OT General Charges $OT Visit: 1 Visit OT Evaluation $OT Eval Moderate Complexity: 1 Mod OT Treatments $Self Care/Home Management : 8-22 mins  Rockney Ghee, M.S., OTR/L 08/14/22, 12:30 PM

## 2022-08-14 NOTE — TOC Initial Note (Signed)
Transition of Care [facility]) - Initial/Assessment Note    Patient Details  Name: [name] [name] MRN: [id number] Date of Birth: [birth year]  [facility]) CM/SW Contact:    [name], RN Phone Number: 08/14/2022, 1:51 PM  Clinical Narrative:  Chart reviewed.  Noted that patient was admitted with Sepsis secondary to left second toe gangrene/infection.  Patient has hx of right BKA.  Patient has been started on IV Vancomycin, Flagyl, and Cefepime.  Noted that patient had LE AKA on 08-13-22. Podiatry following.    I have meet with patient and his wife Cameron Schwinn at bedside today.  Patient reports that prior to admission he lived at home with his wife.  He reports that he was able to get around in a wheelchair.  His wife assisted with his bathing and dressing at home.  Mrs. Buege would take to appointments.  Patient has an old power wheelchair at home, and a BSC at home.  Patient's PCP is Dr. Laren Everts with [facility]. Patient uses [facility] for prescription medications.  Patient also uses [facility] mail order pharmacy for mail order medications.    I have spoken to patient and his wife about the placement process.  Patient reports that he has been to [facility] and would like to go to that facility again if possible.  Patient is agreeable to bed search in the [place] area.  I have also informed patient and his wife that insurance authorization will have to be submitted and obtained prior to discharge to SNF.\  TOC will continue to follow for discharge planning.               Expected Discharge Plan: Skilled Nursing Facility Barriers to Discharge: No Barriers Identified   Patient Goals and CMS Choice     Choice offered to / list presented to : Patient (Patient would prefer [facility], agreeable to faxing out to all [facility] facilities.)      Expected Discharge Plan and Services     Post Acute Care Choice: Skilled Nursing  Facility (Patient will like to go to [facility] for Short term rehab.) Living arrangements for the past 2 months: Single Family Home                                      Prior Living Arrangements/Services Living arrangements for the past 2 months: Single Family Home Lives with:: Spouse Patient language and need for interpreter reviewed:: Yes Do you feel safe going back to the place where you live?: Yes      Need for Family Participation in Patient Care: Yes (Comment) (Patient has a supportive wife.) Care giver support system in place?: Yes (comment) (Patient has a supportive wife)      Activities of Daily Living Home Assistive Devices/Equipment: CBG Meter, Wheelchair ADL Screening (condition at time of admission) Patient's cognitive ability adequate to safely complete daily activities?: Yes Is the patient deaf or have difficulty hearing?: No Does the patient have difficulty seeing, even when wearing glasses/contacts?: No Does the patient have difficulty concentrating, remembering, or making decisions?: No Patient able to express need for assistance with ADLs?: Yes Does the patient have difficulty dressing or bathing?: Yes Independently performs ADLs?: No Communication: Independent Dressing (OT): Needs assistance Is this a change from baseline?: Pre-admission baseline Grooming: Needs assistance Is this a change from baseline?: Pre-admission baseline Feeding: Independent Bathing: Needs  assistance Is this a change from baseline?: Pre-admission baseline Toileting: Dependent Is this a change from baseline?: Change from baseline, expected to last <3 days In/Out Bed: Needs assistance Is this a change from baseline?: Pre-admission baseline Walks in Home: Needs assistance Is this a change from baseline?: Pre-admission baseline Does the patient have difficulty walking or climbing stairs?: Yes Weakness of Legs: Both (right aka) Weakness of Arms/Hands: None  Permission  Sought/Granted   Permission granted to share information with : Yes, Verbal Permission Granted              Emotional Assessment Appearance:: Appears stated age Attitude/Demeanor/Rapport: Engaged Affect (typically observed): Appropriate Orientation: : Oriented to Self, Oriented to Place, Oriented to  Time, Oriented to Situation      Admission diagnosis:  Generalized weakness [R53.1] AKI (acute kidney injury) [N17.9] Sepsis due to undetermined organism [A41.9] Other fatigue [R53.83] Sepsis, due to unspecified organism, unspecified whether acute organ dysfunction present [A41.9] Patient Active Problem List   Diagnosis Date Noted   Sepsis due to undetermined organism 08/10/2022   Cellulitis of second toe of left foot 08/10/2022   Generalized weakness 08/10/2022   Sepsis 08/10/2022   Gangrene 08/10/2022   AKI (acute kidney injury) 08/10/2022   Hx of AKA (above knee amputation), right 03/29/2021   Acute blood loss anemia    Anemia of chronic disease    Leukocytosis    Chronic diastolic CHF (congestive heart failure)    Moderate malnutrition 03/11/2021   Acute osteomyelitis of right ankle 03/08/2021   Open wound of right ankle 03/08/2021   Lactic acidosis 03/08/2021   Hypoalbuminemia due to protein-calorie malnutrition 03/08/2021   Hyperglycemia due to diabetes mellitus 03/08/2021   Hyponatremia 03/08/2021   Mixed hyperlipidemia 03/08/2021   Peripheral arterial disease 03/08/2021   CHF (congestive heart failure) 02/02/2021   Type 2 diabetes mellitus with peripheral neuropathy 02/02/2021   Right leg weakness 11/19/2019   Vitamin B12 deficiency 05/20/2018   Vitamin D deficiency 05/20/2018   Cardiomyopathy 07/29/2013   Asthma 02/19/2012   Essential hypertension 02/19/2012   Atrial fibrillation 10/27/2011   GERD (gastroesophageal reflux disease) 10/27/2011   Dyslipidemia 10/27/2011   PCP:  Luciana Axe, NP Pharmacy:   [facility] - [place], [place] -  [address] [place] [address] Phone: [phone] Fax: [phone]     Social Determinants of Health (SDOH) Social History: SDOH Screenings   Food Insecurity: No Food Insecurity (08/10/2022)  Housing: Low Risk  (08/10/2022)  Transportation Needs: No Transportation Needs (08/10/2022)  Utilities: Not At Risk (08/10/2022)  Tobacco Use: High Risk (08/14/2022)   SDOH Interventions:     Readmission Risk Interventions     No data to display

## 2022-08-14 NOTE — TOC Progression Note (Signed)
Transition of Care [facility]) - Progression Note    Patient Details  Name: [name] [name] MRN: [id number] Date of Birth: 20-Jun-1940  Transition of Care [facility]) CM/SW Contact  [name], RN Phone Number: 08/14/2022, 3:13 PM  Clinical Narrative:   [place] Cao completed.  Bed search to [facility] facilities completed.  Will continue to follow for discharge planning.     Expected Discharge Plan: Skilled Nursing Facility Barriers to Discharge: No Barriers Identified  Expected Discharge Plan and Services     Post Acute Care Choice: Skilled Nursing Facility (Patient will like to go to [facility] for Short term rehab.) Living arrangements for the past 2 months: Single Family Home                                       Social Determinants of Health (SDOH) Interventions SDOH Screenings   Food Insecurity: No Food Insecurity (08/10/2022)  Housing: Low Risk  (08/10/2022)  Transportation Needs: No Transportation Needs (08/10/2022)  Utilities: Not At Risk (08/10/2022)  Tobacco Use: High Risk (08/14/2022)    Readmission Risk Interventions     No data to display

## 2022-08-14 NOTE — NC FL2 (Signed)
[place] MEDICAID FL2 LEVEL OF CARE FORM     IDENTIFICATION  Patient Name: [name] [name] Birthdate: 09/25/1940 Sex: male Admission Date (Current Location): 08/09/2022  [facility] and [place] Number:  Chiropodist and Address:  [facility], [address] [place], [place] [address]      Provider Number: [id number]  Attending Physician Name and Address:  Enedina Finner, MD  Relative Name and Phone Number:  Aliou Mealey [phone]    Current Level of Care: Hospital Recommended Level of Care: Skilled Nursing Facility Prior Approval Number:    Date Approved/Denied:   PASRR Number: [id number]  Discharge Plan: SNF    Current Diagnoses: Patient Active Problem List   Diagnosis Date Noted   Sepsis due to undetermined organism 08/10/2022   Cellulitis of second toe of left foot 08/10/2022   Generalized weakness 08/10/2022   Sepsis 08/10/2022   Gangrene 08/10/2022   AKI (acute kidney injury) 08/10/2022   Hx of AKA (above knee amputation), right 03/29/2021   Acute blood loss anemia    Anemia of chronic disease    Leukocytosis    Chronic diastolic CHF (congestive heart failure)    Moderate malnutrition 03/11/2021   Acute osteomyelitis of right ankle 03/08/2021   Open wound of right ankle 03/08/2021   Lactic acidosis 03/08/2021   Hypoalbuminemia due to protein-calorie malnutrition 03/08/2021   Hyperglycemia due to diabetes mellitus 03/08/2021   Hyponatremia 03/08/2021   Mixed hyperlipidemia 03/08/2021   Peripheral arterial disease 03/08/2021   CHF (congestive heart failure) 02/02/2021   Type 2 diabetes mellitus with peripheral neuropathy 02/02/2021   Right leg weakness 11/19/2019   Vitamin B12 deficiency 05/20/2018   Vitamin D deficiency 05/20/2018   Cardiomyopathy 07/29/2013   Asthma 02/19/2012   Essential hypertension 02/19/2012   Atrial fibrillation 10/27/2011   GERD (gastroesophageal reflux disease) 10/27/2011   Dyslipidemia 10/27/2011     Orientation RESPIRATION BLADDER Height & Weight     Self, Time, Situation, Place  Normal Incontinent Weight: 89.1 kg Height:  6' (182.9 cm)  BEHAVIORAL SYMPTOMS/MOOD NEUROLOGICAL BOWEL NUTRITION STATUS      Incontinent  (See discharge summary)  AMBULATORY STATUS COMMUNICATION OF NEEDS Skin   Total Care (Patient has Right BKA and Left AKA) Verbally Normal                       Personal Care Assistance Level of Assistance  Bathing, Dressing Bathing Assistance: Maximum assistance   Dressing Assistance: Maximum assistance     Functional Limitations Info  Sight, Hearing, Speech Sight Info: Adequate Hearing Info: Adequate Speech Info: Adequate    SPECIAL CARE FACTORS FREQUENCY  PT (By licensed PT), OT (By licensed OT)     PT Frequency: 5x weekly OT Frequency: 5x weekly            Contractures Contractures Info: Not present    Additional Factors Info  Code Status, Allergies Code Status Info: Full Code Allergies Info: No known allergies           Current Medications (08/14/2022):  This is the current hospital active medication list Current Facility-Administered Medications  Medication Dose Route Frequency Provider Last Rate Last Admin   acetaminophen (TYLENOL) tablet 650 mg  650 mg Oral Q6H PRN Annice Needy, MD   650 mg at 08/12/22 0443   Or   acetaminophen (TYLENOL) suppository 650 mg  650 mg Rectal Q6H PRN Annice Needy, MD       ascorbic acid (VITAMIN  C) tablet 500 mg  500 mg Oral Q breakfast Annice Needy, MD   500 mg at 08/14/22 0743   atorvastatin (LIPITOR) tablet 10 mg  10 mg Oral Daily Annice Needy, MD   10 mg at 08/14/22 0743   carvedilol (COREG) tablet 6.25 mg  6.25 mg Oral BID WC Annice Needy, MD   6.25 mg at 08/14/22 1610   cyanocobalamin (VITAMIN B12) tablet 1,000 mcg  1,000 mcg Oral Daily Annice Needy, MD   1,000 mcg at 08/14/22 0743   digoxin (LANOXIN) tablet 0.125 mg  0.125 mg Oral Daily Annice Needy, MD   0.125 mg at 08/14/22 0743   enoxaparin  (LOVENOX) injection 40 mg  40 mg Subcutaneous Q24H Annice Needy, MD   40 mg at 08/14/22 1134   fentaNYL (SUBLIMAZE) injection 12.5 mcg  12.5 mcg Intravenous Once PRN Annice Needy, MD       ferrous sulfate tablet 325 mg  325 mg Oral Q breakfast Annice Needy, MD   325 mg at 08/14/22 9604   furosemide (LASIX) tablet 20 mg  20 mg Oral Daily PRN Annice Needy, MD       gabapentin (NEURONTIN) capsule 100 mg  100 mg Oral TID Annice Needy, MD   100 mg at 08/14/22 0743   HYDROmorphone (DILAUDID) injection 1 mg  1 mg Intravenous Once PRN Annice Needy, MD       insulin aspart (novoLOG) injection 0-15 Units  0-15 Units Subcutaneous TID WC Enedina Finner, MD       insulin aspart (novoLOG) injection 0-5 Units  0-5 Units Subcutaneous QHS Enedina Finner, MD       lisinopril (ZESTRIL) tablet 15 mg  15 mg Oral Daily Annice Needy, MD   15 mg at 08/14/22 5409   magnesium hydroxide (MILK OF MAGNESIA) suspension 30 mL  30 mL Oral Daily PRN Annice Needy, MD       morphine (PF) 2 MG/ML injection 2 mg  2 mg Intravenous Q4H PRN Annice Needy, MD   2 mg at 08/13/22 1257   ondansetron (ZOFRAN) tablet 4 mg  4 mg Oral Q6H PRN Annice Needy, MD       Or   ondansetron (ZOFRAN) injection 4 mg  4 mg Intravenous Q6H PRN Annice Needy, MD       ondansetron [facility]) injection 4 mg  4 mg Intravenous Q6H PRN Annice Needy, MD       traZODone (DESYREL) tablet 25 mg  25 mg Oral QHS PRN Annice Needy, MD   25 mg at 08/13/22 2254     Discharge Medications: Please see discharge summary for a list of discharge medications.  Relevant Imaging Results:  Relevant Lab Results:   Additional Information SS- [id number]  [name], RN

## 2022-08-14 NOTE — Progress Notes (Signed)
  Progress Note    08/14/2022 12:29 PM 1 Day Post-Op  Subjective:  This is a 82 y.o. male African-American male with medical history significant for R AKA, CHF, type 2 diabetes mellitus, PAD, hypertension and pancreatitis, who presented to the emergency room with acute onset of generalized weakness with significant anorexia over the last couple of days. Vascular surgery was consulted for left lower extremity gangrene toes and open sores. Vascular attempted angiogram procedure on 4/19, however this was unsuccessful and pt is now s/p L AKA.      Vitals:   08/13/22 2112 08/14/22 0742  BP: 112/64 127/68  Pulse: 67 75  Resp: 19 18  Temp: 98.8 F (37.1 C) 97.9 F (36.6 C)  SpO2: 100% 94%   Physical Exam: Cardiac:  AFIB, No Murmurs or rubs Lungs:  Normal breath sounds bilaterally, no wheezing  Incisions:  Right AKA with bandage intact. No noted drainage.  Extremities:  Bilateral AKA Abdomen:  Positive bowel sounds, soft, tender, nondistended. Neurologic: Alert and oriented x 3, follows all commands, answers all questions appropriately.  CBC    Component Value Date/Time   WBC 14.4 (H) 08/14/2022 0542   RBC 3.07 (L) 08/14/2022 0542   HGB 8.7 (L) 08/14/2022 0542   HGB 14.7 02/24/2012 0325   HCT 25.1 (L) 08/14/2022 0542   HCT 44.4 02/24/2012 0325   PLT 192 08/14/2022 0542   PLT 108 (L) 02/24/2012 0325   MCV 81.8 08/14/2022 0542   MCV 90 02/24/2012 0325   MCH 28.3 08/14/2022 0542   MCHC 34.7 08/14/2022 0542   RDW 14.4 08/14/2022 0542   RDW 14.0 02/24/2012 0325   LYMPHSABS 1.6 08/13/2022 0719   LYMPHSABS 2.0 02/24/2012 0325   MONOABS 2.0 (H) 08/13/2022 0719   MONOABS 0.7 02/24/2012 0325   EOSABS 0.1 08/13/2022 0719   EOSABS 0.1 02/24/2012 0325   BASOSABS 0.0 08/13/2022 0719   BASOSABS 0.1 02/24/2012 0325    BMET    Component Value Date/Time   NA 137 08/14/2022 0542   NA 143 02/24/2012 0325   K 3.6 08/14/2022 0542   K 4.1 02/24/2012 0325   CL 105 08/14/2022 0542    CL 108 (H) 02/24/2012 0325   CO2 24 08/14/2022 0542   CO2 26 02/24/2012 0325   GLUCOSE 108 (H) 08/14/2022 0542   GLUCOSE 120 (H) 02/24/2012 0325   BUN 16 08/14/2022 0542   BUN 25 (H) 02/24/2012 0325   CREATININE 1.36 (H) 08/14/2022 0542   CREATININE 1.47 (H) 02/24/2012 0325   CALCIUM 8.0 (L) 08/14/2022 0542   CALCIUM 9.1 02/24/2012 0325   GFRNONAA 53 (L) 08/14/2022 0542   GFRNONAA 48 (L) 02/24/2012 0325   GFRAA 55 (L) 02/24/2012 0325    INR    Component Value Date/Time   INR 1.6 (H) 08/13/2022 0719     Intake/Output Summary (Last 24 hours) at 08/14/2022 1229 Last data filed at 08/14/2022 0719 Gross per 24 hour  Intake 100 ml  Output --  Net 100 ml     Assessment/Plan:  82 y.o. male is s/p right above-the-knee amputation.  1 Day Post-Op   PLAN: PT/OT Eval and treat Advance diet as tolerated  Agree with stopping antibiotics now that Necrotic lower extremity removed.  Vascular Surgery okay with discharge to rehab. Patient will follow up in clinic in 3 weeks for staple removal.   DVT prophylaxis:  Lovenox 40 mg SQ daily   Marcie [facility] 08/14/2022 12:29 PM

## 2022-08-14 NOTE — Progress Notes (Addendum)
[facility]  - [place] [facility]   PATIENT NAME: [name] [name]    MR#:  [id number]  DATE OF BIRTH:  07/31/1940  SUBJECTIVE:  No family at bedside No new complaints POD #1 left AKA  VITALS:  Blood pressure 127/68, pulse 75, temperature 97.9 F (36.6 C), resp. rate 18, height 6' (1.829 m), weight 89.1 kg, SpO2 94 %.  PHYSICAL EXAMINATION:   GENERAL:  82 y.o.-year-old patient with no acute distress.  LUNGS: Normal breath sounds bilaterally, no wheezing CARDIOVASCULAR: S1, S2 normal. No murmur   ABDOMEN: Soft, nontender, nondistended. Bowel sounds present.  EXTREMITIES: left amputation stump dressing + NEUROLOGIC: nonfocal  patient is alert and awake  LABORATORY PANEL:  CBC Recent Labs  Lab 08/14/22 0542  WBC 14.4*  HGB 8.7*  HCT 25.1*  PLT 192     Chemistries  Recent Labs  Lab 08/14/22 0542  NA 137  K 3.6  CL 105  CO2 24  GLUCOSE 108*  BUN 16  CREATININE 1.36*  CALCIUM 8.0*  AST 21  ALT 7  ALKPHOS 77  BILITOT 0.9    Assessment and Plan  [name] [name] is a 82 y.o. male brought to the ED via EMS from home with a chief complaint of generalized weakness and fatigue.  Patient with a history of CHF, atrial fibrillation on Xarelto, on Digoxin.  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 Lactic acid normalized. No fever. -- Podiatry consultation with Dr. Wilber Bihari fowler--no need for MRI --sepsis resolved  Severe  peripheral arterial disease -- patient underwent left lower extremity angiogram in March and April per family -- Dr. Wyn Quaker had to hold off on angiogram lower extremity due to elevated INR. Patient on Xarelto. -- Xarelto on hold -- INR 1.6.  -- s/p Angiogram lower extremity:08/11/22  Aortogram:  This demonstrated normal renal arteries and normal aorta and iliac segments without significant stenosis  Left Lower Extremity:  This demonstrated fairly normal common femoral artery, superficial femoral artery, and popliteal artery with very mild disease which was not flow-limiting.  The posterior tibial artery that had been treated several weeks ago was open and patent into the foot providing good flow into the foot.  The peroneal artery was small and did not really provide flow into the foot.  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 ---Plans for left LE amputation today by dr Evie Lacks  Type II diabetes, hypoglycemia, peripheral neuropathy -- A1c 5.5 -- sliding scale insulin -- continue Neurontin -- received IV dextrose drip. Sugars now stable  Hypertension -- continue home meds  Hyperlipidemia -- continue statins  Hematuria in the setting of INR 4.5 (on xarelto) --send urine culture--NO growth --monitor H and H  --consider urology consult if symptoms worsen  PT to see pt TO C for d/c planning   Procedures: left LE angiogram, left AKA Family communication :none today Consults : podiatry, vascular CODE STATUS: full DVT Prophylaxis :Lovenox Level of care: Telemetry Medical Status is: Inpatient Remains inpatient appropriate because: POD #1  left AKA. TOC for d/c planning    TOTAL TIME TAKING CARE OF THIS PATIENT: 35 minutes.  >50% time spent on counselling and coordination of care  Note: This dictation was prepared with Dragon dictation along with smaller phrase technology. Any transcriptional errors that result from this process are  unintentional.  Enedina Finner M.D    [facility]   CC: Primary care physician; Luciana Axe, NP

## 2022-08-15 DIAGNOSIS — R531 Weakness: Secondary | ICD-10-CM | POA: Diagnosis not present

## 2022-08-15 DIAGNOSIS — M79601 Pain in right arm: Secondary | ICD-10-CM | POA: Diagnosis not present

## 2022-08-15 DIAGNOSIS — I10 Essential (primary) hypertension: Secondary | ICD-10-CM | POA: Diagnosis not present

## 2022-08-15 DIAGNOSIS — Z4781 Encounter for orthopedic aftercare following surgical amputation: Secondary | ICD-10-CM | POA: Diagnosis not present

## 2022-08-15 DIAGNOSIS — F4323 Adjustment disorder with mixed anxiety and depressed mood: Secondary | ICD-10-CM | POA: Diagnosis not present

## 2022-08-15 DIAGNOSIS — Z89612 Acquired absence of left leg above knee: Secondary | ICD-10-CM | POA: Diagnosis not present

## 2022-08-15 DIAGNOSIS — E1143 Type 2 diabetes mellitus with diabetic autonomic (poly)neuropathy: Secondary | ICD-10-CM | POA: Diagnosis not present

## 2022-08-15 DIAGNOSIS — I739 Peripheral vascular disease, unspecified: Secondary | ICD-10-CM | POA: Diagnosis not present

## 2022-08-15 DIAGNOSIS — E119 Type 2 diabetes mellitus without complications: Secondary | ICD-10-CM | POA: Diagnosis not present

## 2022-08-15 DIAGNOSIS — E11649 Type 2 diabetes mellitus with hypoglycemia without coma: Secondary | ICD-10-CM | POA: Diagnosis not present

## 2022-08-15 DIAGNOSIS — A419 Sepsis, unspecified organism: Secondary | ICD-10-CM | POA: Diagnosis not present

## 2022-08-15 DIAGNOSIS — I4891 Unspecified atrial fibrillation: Secondary | ICD-10-CM | POA: Diagnosis not present

## 2022-08-15 DIAGNOSIS — R001 Bradycardia, unspecified: Secondary | ICD-10-CM | POA: Diagnosis not present

## 2022-08-15 DIAGNOSIS — E785 Hyperlipidemia, unspecified: Secondary | ICD-10-CM | POA: Diagnosis not present

## 2022-08-15 DIAGNOSIS — Z7401 Bed confinement status: Secondary | ICD-10-CM | POA: Diagnosis not present

## 2022-08-15 DIAGNOSIS — Z961 Presence of intraocular lens: Secondary | ICD-10-CM | POA: Diagnosis not present

## 2022-08-15 DIAGNOSIS — Z89611 Acquired absence of right leg above knee: Secondary | ICD-10-CM | POA: Diagnosis not present

## 2022-08-15 DIAGNOSIS — N179 Acute kidney failure, unspecified: Secondary | ICD-10-CM | POA: Diagnosis not present

## 2022-08-15 DIAGNOSIS — T879 Unspecified complications of amputation stump: Secondary | ICD-10-CM | POA: Diagnosis not present

## 2022-08-15 LAB — GLUCOSE, CAPILLARY
Glucose-Capillary: 87 mg/dL (ref 70–99)
Glucose-Capillary: 105 mg/dL — ABNORMAL HIGH (ref 70–99)

## 2022-08-15 MED ORDER — TRAMADOL HCL 50 MG PO TABS: 50.0000 mg | ORAL_TABLET | Freq: Three times a day (TID) | ORAL | 0 refills | Status: DC | PRN

## 2022-08-15 MED ORDER — TRAMADOL HCL 50 MG PO TABS: 50.0000 mg | ORAL_TABLET | Freq: Four times a day (QID) | ORAL | Status: DC | PRN

## 2022-08-15 MED ORDER — RIVAROXABAN 20 MG PO TABS
20.0000 mg | ORAL_TABLET | Freq: Every day | ORAL | Status: DC
  Filled 2022-08-15: qty 1

## 2022-08-15 NOTE — Progress Notes (Signed)
Attempted to call report again, no answer.

## 2022-08-15 NOTE — TOC Progression Note (Signed)
Transition of Care [facility]) - Progression Note    Patient Details  Name: [name] [name] MRN: [id number] Date of Birth: [birth year]  [facility] CM/SW Contact  [name], RN Phone Number: 08/15/2022, 2:54 PM  Clinical Narrative:   I have received call from [facility] with [facility].  She informs me that patient's room number is 501 A and to call report to [phone].  I have informed staff nurse of the above information.      Expected Discharge Plan: Skilled Nursing Facility Barriers to Discharge: No Barriers Identified  Expected Discharge Plan and Services     Post Acute Care Choice: Skilled Nursing Facility (Patient will like to go to [facility] for Short term rehab.) Living arrangements for the past 2 months: Single Family Home Expected Discharge Date: 08/15/22                 DME Agency: [facility]                   Social Determinants of Health (SDOH) Interventions SDOH Screenings   Food Insecurity: No Food Insecurity (08/10/2022)  Housing: Low Risk  (08/10/2022)  Transportation Needs: No Transportation Needs (08/10/2022)  Utilities: Not At Risk (08/10/2022)  Tobacco Use: High Risk (08/14/2022)    Readmission Risk Interventions     No data to display

## 2022-08-15 NOTE — Discharge Summary (Signed)
Physician Discharge Summary   Patient: [name] [name] MRN: [id number] DOB: 21-Jan-1941  Admit date:     08/09/2022  Discharge date: 08/15/22  Discharge Physician: Enedina Finner   PCP: Luciana Axe, NP   Recommendations at discharge:   follow-up vascular surgery nurse practitioner Sheppard Plumber in three weeks. Dressing changes per instruction follow-up PCP in 1 to 2 weeks  Discharge Diagnoses: Principal Problem:   Sepsis due to undetermined organism Active Problems:   Cellulitis of second toe of left foot   Type 2 diabetes mellitus with peripheral neuropathy   Dyslipidemia   Essential hypertension   Generalized weakness   Sepsis   Gangrene   AKI (acute kidney injury)     [name] [name] is a 82 y.o. male brought to the ED via EMS from home with a chief complaint of generalized weakness and fatigue.  Patient with a history of CHF, atrial fibrillation on Xarelto, on Digoxin.  Wife states patient has not been eating well for the past 2 days     Severe sepsis secondary to left second toe gangrene/infection with foul-smelling discharge/?OM severe peripheral vascular disease history of right BKA  -- patient came in with generalized weakness, fatigue, hypertension, leukocytosis, elevated lactic acid 3.7--1.7, foul-smelling left second total -- received IV fluids per sepsis protocol. 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 Lactic acid normalized. No fever. -- Podiatry consultation with Dr. Wilber Bihari fowler--no need for MRI --sepsis resolved   Severe peripheral arterial disease -- patient underwent left lower extremity angiogram in March and April per family -- Dr. Wyn Quaker had to hold off on angiogram lower extremity due to elevated INR. Patient on Xarelto. -- Xarelto on hold -- INR 1.6.  -- s/p Angiogram lower extremity:08/11/22  Aortogram:  This demonstrated normal  renal arteries and normal aorta and iliac segments without significant stenosis  Left Lower Extremity:  This demonstrated fairly normal common femoral artery, superficial femoral artery, and popliteal artery with very mild disease which was not flow-limiting.  The posterior tibial artery that had been treated several weeks ago was open and patent into the foot providing good flow into the foot.  The peroneal artery was small and did not really provide flow into the foot.  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 ---s/p POD #2 left AKA --ok from vascular standpoint for d/c. F/u in 3 weeks. cont dressing changes per instruction   H/o PAF --will resume xarelto   Type II diabetes, hypoglycemia, peripheral neuropathy -- A1c 5.5 -- sliding scale insulin -- continue Neurontin -- received IV dextrose drip. Sugars now stable --pt does not need po meds. Sugars well controlled   Hypertension -- continue home meds   Hyperlipidemia -- continue statins   Hematuria in the setting of INR 4.5 (on xarelto) --send urine culture--NO growth --monitor H and H  --resolved   PT eval appreciated. Pt ill benefit from rehab given left AKA and h/o right BKA TO C for d/c planning     Procedures: left LE angiogram, left AKA Family communication :wife annie aware of plans Consults : podiatry, vascular CODE STATUS: full DVT Prophylaxis : Xarelto  Pain control - [place] Controlled Substance Reporting System database was reviewed. and patient was instructed, not to drive, operate heavy machinery, perform activities at heights, swimming or participation in water activities or provide baby-sitting services while on Pain, Sleep and  Anxiety Medications; until their outpatient Physician has advised to do so again. Also recommended to not to take more than prescribed Pain, Sleep and Anxiety Medications.  Diet recommendation:  Discharge Diet Orders (From  admission, onward)     Start     Ordered   08/15/22 0000  Diet - low sodium heart healthy        08/15/22 1407            DISCHARGE MEDICATION: Allergies as of 08/15/2022   No Known Allergies      Medication List     STOP taking these medications    ferrous gluconate 324 MG tablet Commonly known as: FERGON   metFORMIN 1000 MG tablet Commonly known as: GLUCOPHAGE   SSD 1 % cream Generic drug: silver sulfADIAZINE       TAKE these medications    ascorbic acid 500 MG tablet Commonly known as: VITAMIN C Take 500 mg by mouth daily.   aspirin EC 81 MG tablet Take 1 tablet (81 mg total) by mouth daily. Swallow whole.   atorvastatin 10 MG tablet Commonly known as: Lipitor Take 1 tablet (10 mg total) by mouth daily.   carvedilol 6.25 MG tablet Commonly known as: COREG Take 6.25 mg 2 (two) times daily by mouth.   cyanocobalamin 1000 MCG tablet Commonly known as: VITAMIN B12 Take 1,000 mcg by mouth daily.   digoxin 0.125 MG tablet Commonly known as: LANOXIN Take 0.125 mcg daily by mouth.   ferrous sulfate 325 (65 FE) MG tablet Take by mouth.   furosemide 20 MG tablet Commonly known as: LASIX Take 20 mg daily by mouth.   gabapentin 100 MG capsule Commonly known as: NEURONTIN Take 1 capsule (100 mg total) by mouth 3 (three) times daily.   lisinopril 10 MG tablet Commonly known as: ZESTRIL Take 15 mg by mouth daily.   rivaroxaban 20 MG Tabs tablet Commonly known as: XARELTO Take 20 mg daily by mouth.   traMADol 50 MG tablet Commonly known as: ULTRAM Take 1 tablet (50 mg total) by mouth every 8 (eight) hours as needed for severe pain or moderate pain.               Discharge Care Instructions  (From admission, onward)           Start     Ordered   08/15/22 0000  Discharge wound care:        08/15/22 1407            Contact information for follow-up providers     Georgiana Spinner, NP Follow up in 3 week(s).   Specialty:  Vascular Surgery Why: Vivia [facility] for staple removal and evaluation. Contact information: [address] [place] [address] [phone]         Luciana Axe, NP. Schedule an appointment as soon as possible for a visit in 1 week(s).   Specialty: Family Medicine Why: hospital f/u Contact information: [address] [place] [address] [phone]              Contact information for after-discharge care     Destination     [facility] .   Service: Skilled Nursing Contact information: [address] [place] [address] [phone]                    Discharge Exam: Ceasar Mons Weights   08/09/22 2259  Weight: 89.1 kg  GENERAL:  82 y.o.-year-old  patient with no acute distress.  LUNGS: Normal breath sounds bilaterally, no wheezing CARDIOVASCULAR: S1, S2 normal. No murmur   ABDOMEN: Soft, nontender, nondistended. Bowel sounds present.  EXTREMITIES: left amputation stump dressing + NEUROLOGIC: nonfocal  patient is alert and awake   Condition at discharge: fair  The results of significant diagnostics from this hospitalization (including imaging, microbiology, ancillary and laboratory) are listed below for reference.   Imaging Studies: PERIPHERAL VASCULAR CATHETERIZATION  Result Date: 08/11/2022 See surgical note for result.  DG Foot 2 Views Left  Result Date: 08/10/2022 CLINICAL DATA:  [id number] with sepsis due to cellulitis. EXAM: LEFT FOOT - 2 VIEW COMPARISON:  None Available. FINDINGS: There is mild diffuse edema. There are vascular calcifications in the distal foreleg and foot, with extension into the great toe. No soft tissue gas or radiopaque foreign body are seen. Normal bone mineralization. There is a well-marginated erosive lesion in the medial aspect of the distal first metatarsal measuring 1.5 cm across and at least 5 mm in depth. There is a thin sclerotic border and narrow zone of  transition to the underlying normal bone. There are no visible calcifications in the erosion. There are no other erosive lesions and no aggressive destructive changes. Evidence of fractures is not seen. There are mild features of degenerative arthrosis of the first MTP joint and a small plantar calcaneal spur. IMPRESSION: 1. Mild diffuse edema. No soft tissue gas or radiopaque foreign body. 2. Well-marginated erosive lesion in the medial aspect of the distal first metatarsal, without visible calcifications. This is nonspecific but could be due to gout or chronic osteomyelitis. 3. 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 4. Vascular calcifications in the distal foreleg and foot. Electronically Signed   By: Almira Bar M.D.   On: 08/10/2022 07:21   CT Head Wo Contrast  Result Date: 08/10/2022 CLINICAL DATA:  Not eating well for 2 days. Lethargy and hypotension. EXAM: CT HEAD WITHOUT CONTRAST TECHNIQUE: Contiguous axial images were obtained from the base of the skull through the vertex without intravenous contrast. RADIATION DOSE REDUCTION: This exam was performed according to the departmental dose-optimization program which includes automated exposure control, adjustment of the mA and/or kV according to patient size and/or use of iterative reconstruction technique. COMPARISON:  CT head 02/24/2012 FINDINGS: Brain: No intracranial hemorrhage, mass effect, or evidence of acute infarct. No hydrocephalus. No extra-axial fluid collection. Generalized cerebral atrophy. Ill-defined hypoattenuation within the cerebral white matter is nonspecific but consistent with chronic small vessel ischemic disease. 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 Physiologic basal ganglia calcifications. Vascular: No hyperdense vessel. Intracranial arterial calcification. Skull: No fracture or focal lesion. Sinuses/Orbits: No acute finding. Paranasal sinuses and mastoid air cells are well aerated. Other: None.  IMPRESSION: 1. No evidence of acute intracranial abnormality. Electronically Signed   By: Minerva Fester M.D.   On: 08/10/2022 00:14   DG Chest Port 1 View  Result Date: 08/09/2022 CLINICAL DATA:  Weakness EXAM: PORTABLE CHEST 1 VIEW COMPARISON:  Radiographs 03/08/2021 FINDINGS: Stable cardiomegaly Aortic atherosclerotic calcification. No focal consolidation, pleural effusion, or pneumothorax. No displaced rib fractures. IMPRESSION: 1. No active disease. 2. Cardiomegaly. Electronically Signed   By: Minerva Fester M.D.   On: 08/09/2022 23:31   PERIPHERAL VASCULAR CATHETERIZATION  Result Date: 07/24/2022 See surgical note for result.   Microbiology: Results for orders placed or performed during the hospital encounter of 08/09/22  Blood culture (routine x 2)     Status: None   Collection Time:  08/09/22 11:23 PM   Specimen: BLOOD  Result Value Ref Range Status   Specimen Description BLOOD RIGHT HAND  Final   Special Requests   Final    BOTTLES DRAWN AEROBIC AND ANAEROBIC Blood Culture results may not be optimal due to an inadequate volume of blood received in culture bottles   Culture   Final    NO GROWTH 5 DAYS Performed at [facility], [address] [place], [place] [address]    Report Status 08/14/2022 FINAL  Final  Blood culture (routine x 2)     Status: None   Collection Time: 08/09/22 11:23 PM   Specimen: BLOOD  Result Value Ref Range Status   Specimen Description BLOOD LEFT ASSIST CONTROL  Final   Special Requests   Final    BOTTLES DRAWN AEROBIC AND ANAEROBIC Blood Culture results may not be optimal due to an inadequate volume of blood received in culture bottles   Culture   Final    NO GROWTH 5 DAYS Performed at [facility], [address] [place], [place] [address]    Report Status 08/14/2022 FINAL  Final  Resp panel by RT-PCR (RSV, Flu A&B, Covid) Anterior Nasal Swab     Status: None   Collection Time: 08/09/22 11:23 PM   Specimen: Anterior Nasal  Swab  Result Value Ref Range Status   SARS Coronavirus 2 by RT PCR NEGATIVE NEGATIVE Final    Comment: (NOTE) SARS-CoV-2 target nucleic acids are NOT DETECTED.  The SARS-CoV-2 RNA is generally detectable in upper respiratory specimens during the acute phase of infection. The lowest concentration of SARS-CoV-2 viral copies this assay can detect is 138 copies/mL. A negative result does not preclude SARS-Cov-2 infection and should not be used as the sole basis for treatment or other patient management decisions. A negative result may occur with  improper specimen collection/handling, submission of specimen other than nasopharyngeal swab, presence of viral mutation(s) within the areas targeted by this assay, and inadequate number of viral copies(<138 copies/mL). A negative result must be combined with clinical observations, patient history, and epidemiological information. The expected result is Negative.  Fact Sheet for Patients:  BloggerCourse.com  Fact Sheet for Healthcare Providers:  SeriousBroker.it  This test is no t yet approved or cleared by the [place] FDA and  has been authorized for detection and/or diagnosis of SARS-CoV-2 by FDA under an Emergency Use Authorization (EUA). This EUA will remain  in effect (meaning this test can be used) for the duration of the COVID-19 declaration under Section 564(b)(1) of the Act, 21 U.S.C.section 360bbb-3(b)(1), unless the authorization is terminated  or revoked sooner.       Influenza A by PCR NEGATIVE NEGATIVE Final   Influenza B by PCR NEGATIVE NEGATIVE Final    Comment: (NOTE) The Xpert Xpress SARS-CoV-2/FLU/RSV plus assay is intended as an aid in the diagnosis of influenza from Nasopharyngeal swab specimens and should not be used as a sole basis for treatment. Nasal washings and aspirates are unacceptable for Xpert Xpress SARS-CoV-2/FLU/RSV testing.  Fact Sheet for  Patients: BloggerCourse.com  Fact Sheet for Healthcare Providers: SeriousBroker.it  This test is not yet approved or cleared by the [place] FDA and has been authorized for detection and/or diagnosis of SARS-CoV-2 by FDA under an Emergency Use Authorization (EUA). This EUA will remain in effect (meaning this test can be used) for the duration of the COVID-19 declaration under Section 564(b)(1) of the Act, 21 U.S.C. section 360bbb-3(b)(1), unless the authorization is  terminated or revoked.     Resp Syncytial Virus by PCR NEGATIVE NEGATIVE Final    Comment: (NOTE) Fact Sheet for Patients: BloggerCourse.com  Fact Sheet for Healthcare Providers: SeriousBroker.it  This test is not yet approved or cleared by the [place] FDA and has been authorized for detection and/or diagnosis of SARS-CoV-2 by FDA under an Emergency Use Authorization (EUA). This EUA will remain in effect (meaning this test can be used) for the duration of the COVID-19 declaration under Section 564(b)(1) of the Act, 21 U.S.C. section 360bbb-3(b)(1), unless the authorization is terminated or revoked.  Performed at [facility], [address] [place], [place] [address]   Urine Culture     Status: None   Collection Time: 08/10/22  5:50 PM   Specimen: Urine, Clean Catch  Result Value Ref Range Status   Specimen Description   Final    URINE, CLEAN CATCH Performed at [facility], [address] [place], [place] [address]    Special Requests   Final    NONE Reflexed from [id number] Performed at [facility], [address] [place] [place], [place] [address]    Culture   Final    NO GROWTH Performed at [facility], [address], [place], [place] [address]    Report Status 08/11/2022 FINAL  Final    Labs: CBC: Recent Labs  Lab 08/09/22 2323 08/10/22 0418 08/11/22 0806  08/13/22 0719 08/14/22 0542  WBC 25.6* 22.3* 25.1* 20.6* 14.4*  NEUTROABS 21.1*  --   --  16.5*  --   HGB 12.0* 9.6* 10.3* 9.0* 8.7*  HCT 35.6* 28.0* 29.6* 25.4* 25.1*  MCV 84.6 84.6 82.7 80.6 81.8  PLT 195 176 210 193 192   Basic Metabolic Panel: Recent Labs  Lab 08/09/22 2323 08/10/22 0545 08/11/22 0806 08/13/22 0719 08/14/22 0542  NA 132* 136 135 134* 137  K 4.5 3.6 3.4* 3.4* 3.6  CL 98 104 101 102 105  CO2 23 23 26 23 24   GLUCOSE 179* 92 89 85 108*  BUN 13 12 11 15 16   CREATININE 1.72* 1.35* 1.22 1.39* 1.36*  CALCIUM 8.6* 8.1* 8.4* 8.2* 8.0*   Liver Function Tests: Recent Labs  Lab 08/09/22 2323 08/14/22 0542  AST 29 21  ALT 10 7  ALKPHOS 87 77  BILITOT 2.0* 0.9  PROT 7.1 6.0*  ALBUMIN 2.6* 1.8*   CBG: Recent Labs  Lab 08/14/22 2018 08/14/22 2039 08/14/22 2209 08/15/22 0738 08/15/22 1140  GLUCAP 54* 73 139* 87 105*    Discharge time spent: greater than 30 minutes.  Signed: Enedina Finner, MD [facility] 08/15/2022

## 2022-08-15 NOTE — Progress Notes (Signed)
[facility]  - [place] [facility]   PATIENT NAME: [name] [name]    MR#:  [id number]  DATE OF BIRTH:  11/03/1940  SUBJECTIVE:  No family at bedside No new complaints POD #2 left AKA  VITALS:  Blood pressure 135/72, pulse 69, temperature 98.3 F (36.8 C), temperature source Oral, resp. rate 17, height 6' (1.829 m), weight 89.1 kg, SpO2 99 %.  PHYSICAL EXAMINATION:   GENERAL:  82 y.o.-year-old patient with no acute distress.  LUNGS: Normal breath sounds bilaterally, no wheezing CARDIOVASCULAR: S1, S2 normal. No murmur   ABDOMEN: Soft, nontender, nondistended. Bowel sounds present.  EXTREMITIES: left amputation stump dressing + NEUROLOGIC: nonfocal  patient is alert and awake  LABORATORY PANEL:  CBC Recent Labs  Lab 08/14/22 0542  WBC 14.4*  HGB 8.7*  HCT 25.1*  PLT 192     Chemistries  Recent Labs  Lab 08/14/22 0542  NA 137  K 3.6  CL 105  CO2 24  GLUCOSE 108*  BUN 16  CREATININE 1.36*  CALCIUM 8.0*  AST 21  ALT 7  ALKPHOS 77  BILITOT 0.9    Assessment and Plan  [name] [name] is a 82 y.o. male brought to the ED via EMS from home with a chief complaint of generalized weakness and fatigue.  Patient with a history of CHF, atrial fibrillation on Xarelto, on Digoxin.  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 Lactic acid normalized. No fever. -- Podiatry consultation with Dr. Wilber Bihari fowler--no need for MRI --sepsis  resolved  Severe peripheral arterial disease -- patient underwent left lower extremity angiogram in March and April per family -- Dr. Wyn Quaker had to hold off on angiogram lower extremity due to elevated INR. Patient on Xarelto. -- Xarelto on hold -- INR 1.6.  -- s/p Angiogram lower extremity:08/11/22  Aortogram:  This demonstrated normal renal arteries and normal aorta and iliac segments without significant stenosis  Left Lower Extremity:  This demonstrated fairly normal common femoral artery, superficial femoral artery, and popliteal artery with very mild disease which was not flow-limiting.  The posterior tibial artery that had been treated several weeks ago was open and patent into the foot providing good flow into the foot.  The peroneal artery was small and did not really provide flow into the foot.  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 ---s/p POD #2 left AKA --ok from vascular standpoint for d/c  H/o PAF --will resume xarelto  Type II diabetes, hypoglycemia, peripheral neuropathy -- A1c 5.5 -- sliding scale insulin -- continue Neurontin -- received IV dextrose drip. Sugars now stable --pt does not need po meds. Sugars well controlled  Hypertension -- continue home meds  Hyperlipidemia -- continue statins  Hematuria in the setting of INR 4.5 (on xarelto) --send urine culture--NO growth --monitor H and H  --resolved  PT eval appreciated. Pt ill benefit from rehab given left AKA and h/o right BKA TO C for d/c planning   Procedures: left LE angiogram, left AKA Family communication :  wife annie aware of plans Consults : podiatry, vascular CODE STATUS: full DVT Prophylaxis :Lovenox Level of care: Telemetry Medical Status is: Inpatient Remains inpatient appropriate because: POD # 2 left AKA. TOC for d/c planning to rehab once auth obtained    TOTAL TIME TAKING CARE OF THIS PATIENT: 35 minutes.  >50% time spent on  counselling and coordination of care  Note: This dictation was prepared with Dragon dictation along with smaller phrase technology. Any transcriptional errors that result from this process are unintentional.  Enedina Finner M.D    [facility]   CC: Primary care physician; Luciana Axe, NP

## 2022-08-15 NOTE — Progress Notes (Signed)
Patient awaiting EMS transport to SNF

## 2022-08-15 NOTE — Progress Notes (Signed)
Attempted to call report to [place], no answer

## 2022-08-15 NOTE — Discharge Instructions (Addendum)
Left AKA Dressing instructions:  Remove Old dressing  Cover staples with Xeroform gauze Then cover with dry guaze Then cover with ABD pads Then Wrap with 2 Kerlix rolls snug The wrap with 6 inch ace bandage Snug.   Dressing to be changed daily until staples are removed in 3 weeks.

## 2022-08-15 NOTE — TOC Progression Note (Signed)
Transition of Care [facility]) - Progression Note    Patient Details  Name: [name] [name] MRN: [id number] Date of Birth: 12-24-1940  [facility]) CM/SW Contact  [name], RN Phone Number: 08/15/2022, 11:34 AM  Clinical Narrative:   Patient has his wife have chosen [facility] to received post acute rehab.  I have spoken with Revonda Standard, Admission coordinator at [facility].  Revonda Standard contact number is [phone]. She informs me that their facility will have a bed for patient pending insurance authorization.  I have submitted SNF authorization.  SNF authorization pending.  SNF auth pending ID is [id number].    TOC will continue to follow for discharge planning.        Expected Discharge Plan: Skilled Nursing Facility Barriers to Discharge: No Barriers Identified  Expected Discharge Plan and Services     Post Acute Care Choice: Skilled Nursing Facility (Patient will like to go to [facility] for Short term rehab.) Living arrangements for the past 2 months: Single Family Home                                       Social Determinants of Health (SDOH) Interventions SDOH Screenings   Food Insecurity: No Food Insecurity (08/10/2022)  Housing: Low Risk  (08/10/2022)  Transportation Needs: No Transportation Needs (08/10/2022)  Utilities: Not At Risk (08/10/2022)  Tobacco Use: High Risk (08/14/2022)    Readmission Risk Interventions     No data to display

## 2022-08-15 NOTE — Progress Notes (Signed)
Patient OOF via EMS in stable condition.  °

## 2022-08-15 NOTE — TOC Transition Note (Signed)
Transition of Care [facility]) - CM/SW Discharge Note   Patient Details  Name: [name] [name] MRN: [id number] Date of Birth: 08-24-1940  Transition of Care [facility]) CM/SW Contact:  [name], RN Phone Number: 08/15/2022, 2:33 PM   Clinical Narrative:  Noted that patient is stable for discharge today.  I have spoken with Revonda Standard, Admission Coordinator at [facility].  [facility] reports that patient is able to come to the facility today.  I have informed Revonda Standard that patient has been approved for SNF.  Plan Auth ID is [id number] and Auth ID- [id number].    I have make provider aware that patient has SNF authorization.    I have informed patient's wife [name] that patient will be a discharge for today.  I have informed [name] that [facility] will transport patient to the facility today.    I have sent [facility] patient's Discharge Summary, Discharge orders, and SNF Transfer Packet.    I have informed staff nurse of the above information.      Final next level of care: Skilled Nursing Facility Barriers to Discharge: No Barriers Identified   Patient Goals and CMS Choice   Choice offered to / list presented to : Patient, Spouse  Discharge Placement     Existing PASRR number confirmed : 08/14/22          Patient [facility] bed at: [facility] Patient to be transferred to facility by: [facility] Name of family member notified: [name] Patient and family notified of of transfer: 08/15/22  Discharge Plan and Services Additional resources added to the After Visit Summary for       Post Acute Care Choice: Skilled Nursing Facility (Patient will like to go to [facility] for Short term rehab.)            DME Agency: [facility]                  Social Determinants of Health (SDOH) Interventions SDOH Screenings   Food Insecurity: No Food Insecurity (08/10/2022)  Housing: Low Risk   (08/10/2022)  Transportation Needs: No Transportation Needs (08/10/2022)  Utilities: Not At Risk (08/10/2022)  Tobacco Use: High Risk (08/14/2022)     Readmission Risk Interventions     No data to display

## 2022-08-15 NOTE — Care Management Important Message (Signed)
Important Message  Patient Details  Name: [name] [name] MRN: [id number] Date of Birth: 10-14-1940   Medicare Important Message Given:  Yes     [name] A [name] 08/15/2022, 11:52 AM

## 2022-08-15 NOTE — Progress Notes (Signed)
Progress Note    08/15/2022 10:14 AM 2 Days Post-Op  Subjective:  This is a 82 y.o. male African-American male with medical history significant for R AKA, CHF, type 2 diabetes mellitus, PAD, hypertension and pancreatitis, who presented to the emergency room with acute onset of generalized weakness with significant anorexia over the last couple of days. Vascular surgery was consulted for left lower extremity gangrene toes and open sores. Vascular attempted angiogram procedure on 4/19, however this was unsuccessful and pt is now s/p L AKA.   Patients dressing changed today with help of nurse. No complications to note. Incision healing as expected. No signs or symptoms of infection.    Vitals:   08/15/22 0537 08/15/22 0738  BP: 125/68 135/72  Pulse: 64 69  Resp: 14 17  Temp: 98.7 F (37.1 C) 98.3 F (36.8 C)  SpO2: 100% 99%   Physical Exam: Cardiac:  AFIB, No murmurs or rubs Lungs:  Clear on auscultation, no rhonchi, rales or wheezing Incisions:  Left AKA with staples and healing well and as expected. Dressing changed today Daily dressings needed.  Extremities:  Left AKA POST op day 2 now bilateral AKA Abdomen:  Positive bowel sounds, soft, flat non tender and non distended Neurologic: AAOX3 follows al commands and answers all questions appropriately.   CBC    Component Value Date/Time   WBC 14.4 (H) 08/14/2022 0542   RBC 3.07 (L) 08/14/2022 0542   HGB 8.7 (L) 08/14/2022 0542   HGB 14.7 02/24/2012 0325   HCT 25.1 (L) 08/14/2022 0542   HCT 44.4 02/24/2012 0325   PLT 192 08/14/2022 0542   PLT 108 (L) 02/24/2012 0325   MCV 81.8 08/14/2022 0542   MCV 90 02/24/2012 0325   MCH 28.3 08/14/2022 0542   MCHC 34.7 08/14/2022 0542   RDW 14.4 08/14/2022 0542   RDW 14.0 02/24/2012 0325   LYMPHSABS 1.6 08/13/2022 0719   LYMPHSABS 2.0 02/24/2012 0325   MONOABS 2.0 (H) 08/13/2022 0719   MONOABS 0.7 02/24/2012 0325   EOSABS 0.1 08/13/2022 0719   EOSABS 0.1 02/24/2012 0325   BASOSABS  0.0 08/13/2022 0719   BASOSABS 0.1 02/24/2012 0325    BMET    Component Value Date/Time   NA 137 08/14/2022 0542   NA 143 02/24/2012 0325   K 3.6 08/14/2022 0542   K 4.1 02/24/2012 0325   CL 105 08/14/2022 0542   CL 108 (H) 02/24/2012 0325   CO2 24 08/14/2022 0542   CO2 26 02/24/2012 0325   GLUCOSE 108 (H) 08/14/2022 0542   GLUCOSE 120 (H) 02/24/2012 0325   BUN 16 08/14/2022 0542   BUN 25 (H) 02/24/2012 0325   CREATININE 1.36 (H) 08/14/2022 0542   CREATININE 1.47 (H) 02/24/2012 0325   CALCIUM 8.0 (L) 08/14/2022 0542   CALCIUM 9.1 02/24/2012 0325   GFRNONAA 53 (L) 08/14/2022 0542   GFRNONAA 48 (L) 02/24/2012 0325   GFRAA 55 (L) 02/24/2012 0325    INR    Component Value Date/Time   INR 1.6 (H) 08/13/2022 0719     Intake/Output Summary (Last 24 hours) at 08/15/2022 1014 Last data filed at 08/15/2022 4540 Gross per 24 hour  Intake 120 ml  Output 1200 ml  Net -1080 ml     Assessment/Plan:  82 y.o. male is s/p left above-the-knee amputation.  2 Days Post-Op   PLAN: Patient to be discharged to SNF today. Dressing to left lower extremity changed today and will need daily dressing changes.  Return to clinic  for staple removal will be 3 weeks.   DVT prophylaxis:  Xarelto 20 mg daily also to cover his atrial fibrillation.    Marcie [facility] 08/15/2022 10:14 AM

## 2022-08-15 NOTE — Consult Note (Signed)
   [facility] Inpatient Consult   08/15/2022  [name] [name] January 14, 1941 [id number]     Location: [facility] Liaison met with pt via bedside [facility]).   [facility] Customer service manager [facility]) Accountable Care Organization [ACO] Patient: Chemical engineer)    Primary Care Provider:  Luciana Axe, NP [facility] in [place]  Patient screened for readmission hospitalization with noted medium risk score for unplanned readmission risk with 1 IP in 6 months. [facility]/Population Health RN liaison will assess for potential [facility]) Care Management service needs for post hospital transition for care coordination. Pt was given an appointment reminder card and 24 hours Nurse Advise Line magnet.    Plan. HIPAA verified and [facility]/Population Health RN Liaison will continue to follow ongoing disposition in assessing for post hospital community care coordination/management needs.  Current plan pending authorization for STR at [facility] PAC-RN does not follow this insurance for SNF).   [facility] Care Management/Population Health does not replace or interfere with any arrangements made by the Inpatient Transition of Care team.   For questions contact:      Elliot Cousin, RN, BSN [facility] Liaison    [facility]  Population Health Office Hours MTWF 8:00 am to 6 pm off on Thursday [phone] mobile [phone] [Office toll free line][facility] Office Hours are M-F 8:30 - 5 pm 24 hour nurse advise line [phone] Conceirge  [email or web address]

## 2022-08-16 LAB — SURGICAL PATHOLOGY

## 2022-08-17 DIAGNOSIS — E119 Type 2 diabetes mellitus without complications: Secondary | ICD-10-CM | POA: Diagnosis not present

## 2022-08-17 DIAGNOSIS — I739 Peripheral vascular disease, unspecified: Secondary | ICD-10-CM | POA: Diagnosis not present

## 2022-08-17 DIAGNOSIS — Z89612 Acquired absence of left leg above knee: Secondary | ICD-10-CM | POA: Diagnosis not present

## 2022-08-17 DIAGNOSIS — E785 Hyperlipidemia, unspecified: Secondary | ICD-10-CM | POA: Diagnosis not present

## 2022-08-17 DIAGNOSIS — I4891 Unspecified atrial fibrillation: Secondary | ICD-10-CM | POA: Diagnosis not present

## 2022-08-17 DIAGNOSIS — I10 Essential (primary) hypertension: Secondary | ICD-10-CM | POA: Diagnosis not present

## 2022-08-18 DIAGNOSIS — M79601 Pain in right arm: Secondary | ICD-10-CM | POA: Diagnosis not present

## 2022-08-25 ENCOUNTER — Ambulatory Visit (INDEPENDENT_AMBULATORY_CARE_PROVIDER_SITE_OTHER): Payer: Medicare HMO | Admitting: Nurse Practitioner

## 2022-08-25 ENCOUNTER — Encounter (INDEPENDENT_AMBULATORY_CARE_PROVIDER_SITE_OTHER): Payer: Medicare HMO

## 2022-09-04 DIAGNOSIS — F4323 Adjustment disorder with mixed anxiety and depressed mood: Secondary | ICD-10-CM | POA: Diagnosis not present

## 2022-09-05 ENCOUNTER — Ambulatory Visit (INDEPENDENT_AMBULATORY_CARE_PROVIDER_SITE_OTHER): Payer: Medicare HMO | Admitting: Nurse Practitioner

## 2022-09-05 ENCOUNTER — Other Ambulatory Visit (INDEPENDENT_AMBULATORY_CARE_PROVIDER_SITE_OTHER): Payer: Self-pay | Admitting: Nurse Practitioner

## 2022-09-05 VITALS — BP 122/72 | HR 51 | Resp 18

## 2022-09-05 DIAGNOSIS — Z89612 Acquired absence of left leg above knee: Secondary | ICD-10-CM

## 2022-09-07 DIAGNOSIS — E1143 Type 2 diabetes mellitus with diabetic autonomic (poly)neuropathy: Secondary | ICD-10-CM | POA: Diagnosis not present

## 2022-09-07 DIAGNOSIS — N179 Acute kidney failure, unspecified: Secondary | ICD-10-CM | POA: Diagnosis not present

## 2022-09-07 DIAGNOSIS — M86171 Other acute osteomyelitis, right ankle and foot: Secondary | ICD-10-CM | POA: Diagnosis not present

## 2022-09-07 DIAGNOSIS — I110 Hypertensive heart disease with heart failure: Secondary | ICD-10-CM | POA: Diagnosis not present

## 2022-09-07 DIAGNOSIS — E1169 Type 2 diabetes mellitus with other specified complication: Secondary | ICD-10-CM | POA: Diagnosis not present

## 2022-09-07 DIAGNOSIS — K861 Other chronic pancreatitis: Secondary | ICD-10-CM | POA: Diagnosis not present

## 2022-09-07 DIAGNOSIS — Z4781 Encounter for orthopedic aftercare following surgical amputation: Secondary | ICD-10-CM | POA: Diagnosis not present

## 2022-09-07 DIAGNOSIS — E1152 Type 2 diabetes mellitus with diabetic peripheral angiopathy with gangrene: Secondary | ICD-10-CM | POA: Diagnosis not present

## 2022-09-07 DIAGNOSIS — I5030 Unspecified diastolic (congestive) heart failure: Secondary | ICD-10-CM | POA: Diagnosis not present

## 2022-09-08 LAB — AEROBIC CULTURE

## 2022-09-11 ENCOUNTER — Ambulatory Visit (INDEPENDENT_AMBULATORY_CARE_PROVIDER_SITE_OTHER): Payer: Medicare HMO | Admitting: Nurse Practitioner

## 2022-09-11 ENCOUNTER — Other Ambulatory Visit (INDEPENDENT_AMBULATORY_CARE_PROVIDER_SITE_OTHER): Payer: Self-pay | Admitting: Nurse Practitioner

## 2022-09-12 ENCOUNTER — Ambulatory Visit (INDEPENDENT_AMBULATORY_CARE_PROVIDER_SITE_OTHER): Payer: Medicare HMO

## 2022-09-12 DIAGNOSIS — E1169 Type 2 diabetes mellitus with other specified complication: Secondary | ICD-10-CM | POA: Diagnosis not present

## 2022-09-12 DIAGNOSIS — I5030 Unspecified diastolic (congestive) heart failure: Secondary | ICD-10-CM | POA: Diagnosis not present

## 2022-09-12 DIAGNOSIS — I110 Hypertensive heart disease with heart failure: Secondary | ICD-10-CM | POA: Diagnosis not present

## 2022-09-12 DIAGNOSIS — Z4781 Encounter for orthopedic aftercare following surgical amputation: Secondary | ICD-10-CM | POA: Diagnosis not present

## 2022-09-12 DIAGNOSIS — K861 Other chronic pancreatitis: Secondary | ICD-10-CM | POA: Diagnosis not present

## 2022-09-12 DIAGNOSIS — E1143 Type 2 diabetes mellitus with diabetic autonomic (poly)neuropathy: Secondary | ICD-10-CM | POA: Diagnosis not present

## 2022-09-12 DIAGNOSIS — M86171 Other acute osteomyelitis, right ankle and foot: Secondary | ICD-10-CM | POA: Diagnosis not present

## 2022-09-12 DIAGNOSIS — E1152 Type 2 diabetes mellitus with diabetic peripheral angiopathy with gangrene: Secondary | ICD-10-CM | POA: Diagnosis not present

## 2022-09-12 DIAGNOSIS — N179 Acute kidney failure, unspecified: Secondary | ICD-10-CM | POA: Diagnosis not present

## 2022-09-13 DIAGNOSIS — E1152 Type 2 diabetes mellitus with diabetic peripheral angiopathy with gangrene: Secondary | ICD-10-CM | POA: Diagnosis not present

## 2022-09-13 DIAGNOSIS — Z4781 Encounter for orthopedic aftercare following surgical amputation: Secondary | ICD-10-CM | POA: Diagnosis not present

## 2022-09-13 DIAGNOSIS — M86171 Other acute osteomyelitis, right ankle and foot: Secondary | ICD-10-CM | POA: Diagnosis not present

## 2022-09-13 DIAGNOSIS — I5030 Unspecified diastolic (congestive) heart failure: Secondary | ICD-10-CM | POA: Diagnosis not present

## 2022-09-13 DIAGNOSIS — K861 Other chronic pancreatitis: Secondary | ICD-10-CM | POA: Diagnosis not present

## 2022-09-13 DIAGNOSIS — N179 Acute kidney failure, unspecified: Secondary | ICD-10-CM | POA: Diagnosis not present

## 2022-09-13 DIAGNOSIS — E1169 Type 2 diabetes mellitus with other specified complication: Secondary | ICD-10-CM | POA: Diagnosis not present

## 2022-09-13 DIAGNOSIS — E1143 Type 2 diabetes mellitus with diabetic autonomic (poly)neuropathy: Secondary | ICD-10-CM | POA: Diagnosis not present

## 2022-09-13 DIAGNOSIS — I110 Hypertensive heart disease with heart failure: Secondary | ICD-10-CM | POA: Diagnosis not present

## 2022-09-14 ENCOUNTER — Encounter (INDEPENDENT_AMBULATORY_CARE_PROVIDER_SITE_OTHER): Payer: Self-pay

## 2022-09-14 ENCOUNTER — Ambulatory Visit (INDEPENDENT_AMBULATORY_CARE_PROVIDER_SITE_OTHER): Payer: Medicare HMO | Admitting: Nurse Practitioner

## 2022-09-14 VITALS — BP 103/69 | HR 80 | Resp 18 | Ht 72.0 in | Wt 196.5 lb

## 2022-09-14 DIAGNOSIS — Z89612 Acquired absence of left leg above knee: Secondary | ICD-10-CM

## 2022-09-14 MED ORDER — SULFAMETHOXAZOLE-TRIMETHOPRIM 800-160 MG PO TABS: 1.0000 | ORAL_TABLET | Freq: Two times a day (BID) | ORAL | 0 refills | Status: DC

## 2022-09-15 DIAGNOSIS — I110 Hypertensive heart disease with heart failure: Secondary | ICD-10-CM | POA: Diagnosis not present

## 2022-09-15 DIAGNOSIS — E1143 Type 2 diabetes mellitus with diabetic autonomic (poly)neuropathy: Secondary | ICD-10-CM | POA: Diagnosis not present

## 2022-09-15 DIAGNOSIS — K861 Other chronic pancreatitis: Secondary | ICD-10-CM | POA: Diagnosis not present

## 2022-09-15 DIAGNOSIS — N179 Acute kidney failure, unspecified: Secondary | ICD-10-CM | POA: Diagnosis not present

## 2022-09-15 DIAGNOSIS — I5030 Unspecified diastolic (congestive) heart failure: Secondary | ICD-10-CM | POA: Diagnosis not present

## 2022-09-15 DIAGNOSIS — E1152 Type 2 diabetes mellitus with diabetic peripheral angiopathy with gangrene: Secondary | ICD-10-CM | POA: Diagnosis not present

## 2022-09-15 DIAGNOSIS — Z4781 Encounter for orthopedic aftercare following surgical amputation: Secondary | ICD-10-CM | POA: Diagnosis not present

## 2022-09-15 DIAGNOSIS — E1169 Type 2 diabetes mellitus with other specified complication: Secondary | ICD-10-CM | POA: Diagnosis not present

## 2022-09-15 DIAGNOSIS — M86171 Other acute osteomyelitis, right ankle and foot: Secondary | ICD-10-CM | POA: Diagnosis not present

## 2022-09-19 ENCOUNTER — Encounter (INDEPENDENT_AMBULATORY_CARE_PROVIDER_SITE_OTHER): Payer: Self-pay | Admitting: Nurse Practitioner

## 2022-09-19 DIAGNOSIS — D649 Anemia, unspecified: Secondary | ICD-10-CM | POA: Diagnosis not present

## 2022-09-19 DIAGNOSIS — R6520 Severe sepsis without septic shock: Secondary | ICD-10-CM | POA: Diagnosis not present

## 2022-09-19 DIAGNOSIS — N179 Acute kidney failure, unspecified: Secondary | ICD-10-CM | POA: Diagnosis not present

## 2022-09-19 DIAGNOSIS — R531 Weakness: Secondary | ICD-10-CM | POA: Diagnosis not present

## 2022-09-19 DIAGNOSIS — Z8619 Personal history of other infectious and parasitic diseases: Secondary | ICD-10-CM | POA: Diagnosis not present

## 2022-09-19 DIAGNOSIS — A419 Sepsis, unspecified organism: Secondary | ICD-10-CM | POA: Diagnosis not present

## 2022-09-19 DIAGNOSIS — Z09 Encounter for follow-up examination after completed treatment for conditions other than malignant neoplasm: Secondary | ICD-10-CM | POA: Diagnosis not present

## 2022-09-19 DIAGNOSIS — Z89612 Acquired absence of left leg above knee: Secondary | ICD-10-CM | POA: Diagnosis not present

## 2022-09-19 NOTE — Progress Notes (Signed)
Subjective:    Patient ID: [name] [name], male    DOB: Jul 20, 1940, 82 y.o.   MRN: [id number] Chief Complaint  Patient presents with   Follow-up    [name] [name] is an 82 year old male who presents today after having a left above-knee amputation due to gangrenous changes of his left leg.  He previously has right above-knee amputation as well.  He is tolerating this well with no significant rest pain.  The wound itself is primarily intact but there are some areas that have some slightly purulent drainage concerning for possible infection.  Overall he is in good spirits today.    Review of Systems  Skin:  Positive for wound.  All other systems reviewed and are negative.      Objective:   Physical Exam Vitals reviewed.  HENT:     Head: Normocephalic.  Cardiovascular:     Rate and Rhythm: Normal rate.  Pulmonary:     Effort: Pulmonary effort is normal.  Musculoskeletal:     Right Lower Extremity: Right leg is amputated above knee.     Left Lower Extremity: Left leg is amputated above knee.  Skin:    General: Skin is warm and dry.  Neurological:     Mental Status: He is alert and oriented to person, place, and time.     Gait: Gait abnormal.  Psychiatric:        Mood and Affect: Mood normal.        Behavior: Behavior normal.        Thought Content: Thought content normal.        Judgment: Judgment normal.     BP 122/72 (BP Location: Left Arm)   Pulse (!) 51   Resp 18   Past Medical History:  Diagnosis Date   CHF (congestive heart failure) (HCC)    Diabetes (HCC)    Dysrhythmia    AFIB   HTN (hypertension)    Infection    LEG   Pancreatitis     Social History   Socioeconomic History   Marital status: Married    Spouse name: Not on file   Number of children: Not on file   Years of education: Not on file   Highest education level: Not on file  Occupational History   Not on file  Tobacco Use   Smoking status: Never   Smokeless tobacco: Current    Types:  Chew  Substance and Sexual Activity   Alcohol use: No    Alcohol/week: 0.0 standard drinks of alcohol   Drug use: No   Sexual activity: Not on file  Other Topics Concern   Not on file  Social History Narrative   Not on file   Social Determinants of Health   Financial Resource Strain: Not on file  Food Insecurity: No Food Insecurity (08/10/2022)   Hunger Vital Sign    Worried About Running Out of Food in the Last Year: Never true    Ran Out of Food in the Last Year: Never true  Transportation Needs: No Transportation Needs (08/10/2022)   PRAPARE - Administrator, Civil Service (Medical): No    Lack of Transportation (Non-Medical): No  Physical Activity: Not on file  Stress: Not on file  Social Connections: Not on file  Intimate Partner Violence: Not At Risk (08/10/2022)   Humiliation, Afraid, Rape, and Kick questionnaire    Fear of Current or Ex-Partner: No    Emotionally Abused: No    Physically Abused: No  Sexually Abused: No    Past Surgical History:  Procedure Laterality Date   AMPUTATION Right 03/09/2021   Procedure: AMPUTATION FOOT;  Surgeon: Annice Needy, MD;  Location: [facility] ORS;  Service: General;  Laterality: Right;   AMPUTATION Right 03/11/2021   Procedure: AMPUTATION ABOVE KNEE;  Surgeon: Annice Needy, MD;  Location: [facility] ORS;  Service: General;  Laterality: Right;   AMPUTATION Left 08/13/2022   Procedure: AMPUTATION ABOVE KNEE;  Surgeon: Bertram Denver, MD;  Location: [facility] ORS;  Service: Vascular;  Laterality: Left;   CATARACT EXTRACTION W/PHACO Right 03/08/2017   Procedure: CATARACT EXTRACTION PHACO AND INTRAOCULAR LENS PLACEMENT (IOC);  Surgeon: Nevada Crane, MD;  Location: [facility] ORS;  Service: Ophthalmology;  Laterality: Right;  Lot# [id number] H [place]: 00:30.9 AP%: 8.2 CDE: 2.54   CATARACT EXTRACTION W/PHACO Left 04/04/2017   Procedure: CATARACT EXTRACTION PHACO AND INTRAOCULAR LENS PLACEMENT (IOC);  Surgeon: Nevada Crane, MD;  Location:  [facility] ORS;  Service: Ophthalmology;  Laterality: Left;  [place] 00:31.4 AP% 12.0 CDE 3.78 Fluid Pack lot # [id number]   CHOLECYSTECTOMY     EYE SURGERY     right eye   LOWER EXTREMITY ANGIOGRAPHY Right 02/10/2021   Procedure: LOWER EXTREMITY ANGIOGRAPHY;  Surgeon: Annice Needy, MD;  Location: [facility] INVASIVE CV LAB;  Service: Cardiovascular;  Laterality: Right;   LOWER EXTREMITY ANGIOGRAPHY Left 07/03/2022   Procedure: Lower Extremity Angiography;  Surgeon: Annice Needy, MD;  Location: [facility]VAS[facility] CV [facility];  Service: Cardiovascular;  Laterality: Left;   LOWER EXTREMITY ANGIOGRAPHY Left 07/24/2022   Procedure: Lower Extremity Angiography;  Surgeon: Annice Needy, MD;  Location: [facility] INVASIVE CV LAB;  Service: Cardiovascular;  Laterality: Left;   LOWER EXTREMITY ANGIOGRAPHY Left 08/11/2022   Procedure: Lower Extremity Angiography;  Surgeon: Annice Needy, MD;  Location: [facility] INVASIVE CV LAB;  Service: Cardiovascular;  Laterality: Left;    History reviewed. No pertinent family history.  No Known Allergies     Latest Ref Rng & Units 08/14/2022    5:42 AM 08/13/2022    7:19 AM 08/11/2022    8:06 AM  CBC  WBC 4.0 - 10.5 K/uL 14.4  20.6  25.1   Hemoglobin 13.0 - 17.0 g/dL 8.7  9.0  19.1   Hematocrit 39.0 - 52.0 % 25.1  25.4  29.6   Platelets 150 - 400 K/uL 192  193  210       CMP     Component Value Date/Time   NA 137 08/14/2022 0542   NA 143 02/24/2012 0325   K 3.6 08/14/2022 0542   K 4.1 02/24/2012 0325   CL 105 08/14/2022 0542   CL 108 (H) 02/24/2012 0325   CO2 24 08/14/2022 0542   CO2 26 02/24/2012 0325   GLUCOSE 108 (H) 08/14/2022 0542   GLUCOSE 120 (H) 02/24/2012 0325   BUN 16 08/14/2022 0542   BUN 25 (H) 02/24/2012 0325   CREATININE 1.36 (H) 08/14/2022 0542   CREATININE 1.47 (H) 02/24/2012 0325   CALCIUM 8.0 (L) 08/14/2022 0542   CALCIUM 9.1 02/24/2012 0325   PROT 6.0 (L) 08/14/2022 0542   PROT 7.1 02/22/2012 1451   ALBUMIN 1.8 (L) 08/14/2022 0542   ALBUMIN 3.6 02/22/2012 1451    AST 21 08/14/2022 0542   AST 52 (H) 02/22/2012 1451   ALT 7 08/14/2022 0542   ALT 88 (H) 02/22/2012 1451   ALKPHOS 77 08/14/2022 0542   ALKPHOS 61 02/22/2012 1451   BILITOT 0.9 08/14/2022 0542  BILITOT 1.4 (H) 02/22/2012 1451   GFRNONAA 53 (L) 08/14/2022 0542   GFRNONAA 48 (L) 02/24/2012 0325   GFRAA 55 (L) 02/24/2012 0325     No results found.     Assessment & Plan:   1. Hx of AKA (above knee amputation), left (HCC) The patient tolerated staple removal fairly well today.  Every other staple removed.  There is some evidence of drainage concerning for infection so we will send a culture.  Patient will return in approximately 1 week or so for further staple removal.   Current Outpatient Medications on File Prior to Visit  Medication Sig Dispense Refill   ascorbic acid (VITAMIN C) 500 MG tablet Take 500 mg by mouth daily.     aspirin EC 81 MG tablet Take 1 tablet (81 mg total) by mouth daily. Swallow whole. 150 tablet 2   carvedilol (COREG) 6.25 MG tablet Take 6.25 mg 2 (two) times daily by mouth.     digoxin (LANOXIN) 0.125 MG tablet Take 0.125 mcg daily by mouth.     furosemide (LASIX) 20 MG tablet Take 20 mg daily by mouth.     lisinopril (PRINIVIL,ZESTRIL) 10 MG tablet Take 15 mg by mouth daily.     metFORMIN (GLUCOPHAGE) 1000 MG tablet Take 1,000 mg by mouth daily.     rivaroxaban (XARELTO) 20 MG TABS tablet Take 20 mg daily by mouth.     traMADol (ULTRAM) 50 MG tablet Take 1 tablet (50 mg total) by mouth every 8 (eight) hours as needed for severe pain or moderate pain. 15 tablet 0   vitamin B-12 (CYANOCOBALAMIN) 1000 MCG tablet Take 1,000 mcg by mouth daily.     atorvastatin (LIPITOR) 10 MG tablet Take 1 tablet (10 mg total) by mouth daily. 30 tablet 11   ferrous sulfate 325 (65 FE) MG tablet Take by mouth.     gabapentin (NEURONTIN) 100 MG capsule Take 1 capsule (100 mg total) by mouth 3 (three) times daily. 90 capsule 0   No current facility-administered medications on  file prior to visit.    There are no Patient Instructions on file for this visit. No follow-ups on file.   Georgiana Spinner, NP

## 2022-09-20 DIAGNOSIS — E785 Hyperlipidemia, unspecified: Secondary | ICD-10-CM | POA: Diagnosis not present

## 2022-09-20 DIAGNOSIS — E119 Type 2 diabetes mellitus without complications: Secondary | ICD-10-CM | POA: Diagnosis not present

## 2022-09-20 DIAGNOSIS — I739 Peripheral vascular disease, unspecified: Secondary | ICD-10-CM | POA: Diagnosis not present

## 2022-09-20 DIAGNOSIS — I4891 Unspecified atrial fibrillation: Secondary | ICD-10-CM | POA: Diagnosis not present

## 2022-09-20 DIAGNOSIS — I10 Essential (primary) hypertension: Secondary | ICD-10-CM | POA: Diagnosis not present

## 2022-09-21 DIAGNOSIS — N179 Acute kidney failure, unspecified: Secondary | ICD-10-CM | POA: Diagnosis not present

## 2022-09-21 DIAGNOSIS — M86171 Other acute osteomyelitis, right ankle and foot: Secondary | ICD-10-CM | POA: Diagnosis not present

## 2022-09-21 DIAGNOSIS — E1143 Type 2 diabetes mellitus with diabetic autonomic (poly)neuropathy: Secondary | ICD-10-CM | POA: Diagnosis not present

## 2022-09-21 DIAGNOSIS — E1152 Type 2 diabetes mellitus with diabetic peripheral angiopathy with gangrene: Secondary | ICD-10-CM | POA: Diagnosis not present

## 2022-09-21 DIAGNOSIS — Z4781 Encounter for orthopedic aftercare following surgical amputation: Secondary | ICD-10-CM | POA: Diagnosis not present

## 2022-09-21 DIAGNOSIS — E1169 Type 2 diabetes mellitus with other specified complication: Secondary | ICD-10-CM | POA: Diagnosis not present

## 2022-09-21 DIAGNOSIS — K861 Other chronic pancreatitis: Secondary | ICD-10-CM | POA: Diagnosis not present

## 2022-09-21 DIAGNOSIS — I110 Hypertensive heart disease with heart failure: Secondary | ICD-10-CM | POA: Diagnosis not present

## 2022-09-21 DIAGNOSIS — I5030 Unspecified diastolic (congestive) heart failure: Secondary | ICD-10-CM | POA: Diagnosis not present

## 2022-09-21 NOTE — Progress Notes (Signed)
Staple removal

## 2022-09-26 DIAGNOSIS — N179 Acute kidney failure, unspecified: Secondary | ICD-10-CM | POA: Diagnosis not present

## 2022-09-26 DIAGNOSIS — Z4781 Encounter for orthopedic aftercare following surgical amputation: Secondary | ICD-10-CM | POA: Diagnosis not present

## 2022-09-26 DIAGNOSIS — I5030 Unspecified diastolic (congestive) heart failure: Secondary | ICD-10-CM | POA: Diagnosis not present

## 2022-09-26 DIAGNOSIS — E1169 Type 2 diabetes mellitus with other specified complication: Secondary | ICD-10-CM | POA: Diagnosis not present

## 2022-09-26 DIAGNOSIS — K861 Other chronic pancreatitis: Secondary | ICD-10-CM | POA: Diagnosis not present

## 2022-09-26 DIAGNOSIS — I110 Hypertensive heart disease with heart failure: Secondary | ICD-10-CM | POA: Diagnosis not present

## 2022-09-26 DIAGNOSIS — M86171 Other acute osteomyelitis, right ankle and foot: Secondary | ICD-10-CM | POA: Diagnosis not present

## 2022-09-26 DIAGNOSIS — E1152 Type 2 diabetes mellitus with diabetic peripheral angiopathy with gangrene: Secondary | ICD-10-CM | POA: Diagnosis not present

## 2022-09-26 DIAGNOSIS — E1143 Type 2 diabetes mellitus with diabetic autonomic (poly)neuropathy: Secondary | ICD-10-CM | POA: Diagnosis not present

## 2022-09-27 ENCOUNTER — Encounter (INDEPENDENT_AMBULATORY_CARE_PROVIDER_SITE_OTHER): Payer: Self-pay | Admitting: Nurse Practitioner

## 2022-09-27 DIAGNOSIS — I110 Hypertensive heart disease with heart failure: Secondary | ICD-10-CM | POA: Diagnosis not present

## 2022-09-27 DIAGNOSIS — E1169 Type 2 diabetes mellitus with other specified complication: Secondary | ICD-10-CM | POA: Diagnosis not present

## 2022-09-27 DIAGNOSIS — E1143 Type 2 diabetes mellitus with diabetic autonomic (poly)neuropathy: Secondary | ICD-10-CM | POA: Diagnosis not present

## 2022-09-27 DIAGNOSIS — K861 Other chronic pancreatitis: Secondary | ICD-10-CM | POA: Diagnosis not present

## 2022-09-27 DIAGNOSIS — I5030 Unspecified diastolic (congestive) heart failure: Secondary | ICD-10-CM | POA: Diagnosis not present

## 2022-09-27 DIAGNOSIS — N179 Acute kidney failure, unspecified: Secondary | ICD-10-CM | POA: Diagnosis not present

## 2022-09-27 DIAGNOSIS — E1152 Type 2 diabetes mellitus with diabetic peripheral angiopathy with gangrene: Secondary | ICD-10-CM | POA: Diagnosis not present

## 2022-09-27 DIAGNOSIS — Z4781 Encounter for orthopedic aftercare following surgical amputation: Secondary | ICD-10-CM | POA: Diagnosis not present

## 2022-09-27 DIAGNOSIS — M86171 Other acute osteomyelitis, right ankle and foot: Secondary | ICD-10-CM | POA: Diagnosis not present

## 2022-09-28 DIAGNOSIS — E1152 Type 2 diabetes mellitus with diabetic peripheral angiopathy with gangrene: Secondary | ICD-10-CM | POA: Diagnosis not present

## 2022-09-28 DIAGNOSIS — E1169 Type 2 diabetes mellitus with other specified complication: Secondary | ICD-10-CM | POA: Diagnosis not present

## 2022-09-28 DIAGNOSIS — N179 Acute kidney failure, unspecified: Secondary | ICD-10-CM | POA: Diagnosis not present

## 2022-09-28 DIAGNOSIS — Z4781 Encounter for orthopedic aftercare following surgical amputation: Secondary | ICD-10-CM | POA: Diagnosis not present

## 2022-09-28 DIAGNOSIS — K861 Other chronic pancreatitis: Secondary | ICD-10-CM | POA: Diagnosis not present

## 2022-09-28 DIAGNOSIS — M86171 Other acute osteomyelitis, right ankle and foot: Secondary | ICD-10-CM | POA: Diagnosis not present

## 2022-09-28 DIAGNOSIS — I5030 Unspecified diastolic (congestive) heart failure: Secondary | ICD-10-CM | POA: Diagnosis not present

## 2022-09-28 DIAGNOSIS — E1143 Type 2 diabetes mellitus with diabetic autonomic (poly)neuropathy: Secondary | ICD-10-CM | POA: Diagnosis not present

## 2022-09-28 DIAGNOSIS — I110 Hypertensive heart disease with heart failure: Secondary | ICD-10-CM | POA: Diagnosis not present

## 2022-10-03 DIAGNOSIS — E1152 Type 2 diabetes mellitus with diabetic peripheral angiopathy with gangrene: Secondary | ICD-10-CM | POA: Diagnosis not present

## 2022-10-03 DIAGNOSIS — N179 Acute kidney failure, unspecified: Secondary | ICD-10-CM | POA: Diagnosis not present

## 2022-10-03 DIAGNOSIS — M86171 Other acute osteomyelitis, right ankle and foot: Secondary | ICD-10-CM | POA: Diagnosis not present

## 2022-10-03 DIAGNOSIS — E1169 Type 2 diabetes mellitus with other specified complication: Secondary | ICD-10-CM | POA: Diagnosis not present

## 2022-10-03 DIAGNOSIS — K861 Other chronic pancreatitis: Secondary | ICD-10-CM | POA: Diagnosis not present

## 2022-10-03 DIAGNOSIS — Z4781 Encounter for orthopedic aftercare following surgical amputation: Secondary | ICD-10-CM | POA: Diagnosis not present

## 2022-10-03 DIAGNOSIS — I110 Hypertensive heart disease with heart failure: Secondary | ICD-10-CM | POA: Diagnosis not present

## 2022-10-03 DIAGNOSIS — E1143 Type 2 diabetes mellitus with diabetic autonomic (poly)neuropathy: Secondary | ICD-10-CM | POA: Diagnosis not present

## 2022-10-03 DIAGNOSIS — I5030 Unspecified diastolic (congestive) heart failure: Secondary | ICD-10-CM | POA: Diagnosis not present

## 2022-10-05 ENCOUNTER — Ambulatory Visit (INDEPENDENT_AMBULATORY_CARE_PROVIDER_SITE_OTHER): Payer: Medicare HMO | Admitting: Nurse Practitioner

## 2022-10-06 DIAGNOSIS — N179 Acute kidney failure, unspecified: Secondary | ICD-10-CM | POA: Diagnosis not present

## 2022-10-06 DIAGNOSIS — E1169 Type 2 diabetes mellitus with other specified complication: Secondary | ICD-10-CM | POA: Diagnosis not present

## 2022-10-06 DIAGNOSIS — I110 Hypertensive heart disease with heart failure: Secondary | ICD-10-CM | POA: Diagnosis not present

## 2022-10-06 DIAGNOSIS — E1152 Type 2 diabetes mellitus with diabetic peripheral angiopathy with gangrene: Secondary | ICD-10-CM | POA: Diagnosis not present

## 2022-10-06 DIAGNOSIS — E1143 Type 2 diabetes mellitus with diabetic autonomic (poly)neuropathy: Secondary | ICD-10-CM | POA: Diagnosis not present

## 2022-10-06 DIAGNOSIS — M86171 Other acute osteomyelitis, right ankle and foot: Secondary | ICD-10-CM | POA: Diagnosis not present

## 2022-10-06 DIAGNOSIS — K861 Other chronic pancreatitis: Secondary | ICD-10-CM | POA: Diagnosis not present

## 2022-10-06 DIAGNOSIS — Z4781 Encounter for orthopedic aftercare following surgical amputation: Secondary | ICD-10-CM | POA: Diagnosis not present

## 2022-10-06 DIAGNOSIS — I5030 Unspecified diastolic (congestive) heart failure: Secondary | ICD-10-CM | POA: Diagnosis not present

## 2022-10-11 DIAGNOSIS — N179 Acute kidney failure, unspecified: Secondary | ICD-10-CM | POA: Diagnosis not present

## 2022-10-11 DIAGNOSIS — E1169 Type 2 diabetes mellitus with other specified complication: Secondary | ICD-10-CM | POA: Diagnosis not present

## 2022-10-11 DIAGNOSIS — I5030 Unspecified diastolic (congestive) heart failure: Secondary | ICD-10-CM | POA: Diagnosis not present

## 2022-10-11 DIAGNOSIS — E1152 Type 2 diabetes mellitus with diabetic peripheral angiopathy with gangrene: Secondary | ICD-10-CM | POA: Diagnosis not present

## 2022-10-11 DIAGNOSIS — I110 Hypertensive heart disease with heart failure: Secondary | ICD-10-CM | POA: Diagnosis not present

## 2022-10-11 DIAGNOSIS — M86171 Other acute osteomyelitis, right ankle and foot: Secondary | ICD-10-CM | POA: Diagnosis not present

## 2022-10-11 DIAGNOSIS — Z4781 Encounter for orthopedic aftercare following surgical amputation: Secondary | ICD-10-CM | POA: Diagnosis not present

## 2022-10-11 DIAGNOSIS — K861 Other chronic pancreatitis: Secondary | ICD-10-CM | POA: Diagnosis not present

## 2022-10-11 DIAGNOSIS — E1143 Type 2 diabetes mellitus with diabetic autonomic (poly)neuropathy: Secondary | ICD-10-CM | POA: Diagnosis not present

## 2022-10-13 DIAGNOSIS — I10 Essential (primary) hypertension: Secondary | ICD-10-CM | POA: Diagnosis not present

## 2022-10-13 DIAGNOSIS — I4891 Unspecified atrial fibrillation: Secondary | ICD-10-CM | POA: Diagnosis not present

## 2022-10-13 DIAGNOSIS — I739 Peripheral vascular disease, unspecified: Secondary | ICD-10-CM | POA: Diagnosis not present

## 2022-10-13 DIAGNOSIS — E785 Hyperlipidemia, unspecified: Secondary | ICD-10-CM | POA: Diagnosis not present

## 2022-10-16 ENCOUNTER — Telehealth (INDEPENDENT_AMBULATORY_CARE_PROVIDER_SITE_OTHER): Payer: Self-pay

## 2022-10-16 NOTE — Telephone Encounter (Signed)
Spoke with [name] [name] and she states understanding and will reach out to PCP

## 2022-10-16 NOTE — Telephone Encounter (Signed)
We have not been doing that paperwork, I suspect it may have come from his PCP

## 2022-10-18 DIAGNOSIS — Z4781 Encounter for orthopedic aftercare following surgical amputation: Secondary | ICD-10-CM | POA: Diagnosis not present

## 2022-10-18 DIAGNOSIS — K861 Other chronic pancreatitis: Secondary | ICD-10-CM | POA: Diagnosis not present

## 2022-10-18 DIAGNOSIS — I110 Hypertensive heart disease with heart failure: Secondary | ICD-10-CM | POA: Diagnosis not present

## 2022-10-18 DIAGNOSIS — E1143 Type 2 diabetes mellitus with diabetic autonomic (poly)neuropathy: Secondary | ICD-10-CM | POA: Diagnosis not present

## 2022-10-18 DIAGNOSIS — I5030 Unspecified diastolic (congestive) heart failure: Secondary | ICD-10-CM | POA: Diagnosis not present

## 2022-10-18 DIAGNOSIS — E1169 Type 2 diabetes mellitus with other specified complication: Secondary | ICD-10-CM | POA: Diagnosis not present

## 2022-10-18 DIAGNOSIS — E1152 Type 2 diabetes mellitus with diabetic peripheral angiopathy with gangrene: Secondary | ICD-10-CM | POA: Diagnosis not present

## 2022-10-18 DIAGNOSIS — M86171 Other acute osteomyelitis, right ankle and foot: Secondary | ICD-10-CM | POA: Diagnosis not present

## 2022-10-18 DIAGNOSIS — N179 Acute kidney failure, unspecified: Secondary | ICD-10-CM | POA: Diagnosis not present

## 2022-10-20 DIAGNOSIS — N179 Acute kidney failure, unspecified: Secondary | ICD-10-CM | POA: Diagnosis not present

## 2022-10-20 DIAGNOSIS — I5030 Unspecified diastolic (congestive) heart failure: Secondary | ICD-10-CM | POA: Diagnosis not present

## 2022-10-20 DIAGNOSIS — E1143 Type 2 diabetes mellitus with diabetic autonomic (poly)neuropathy: Secondary | ICD-10-CM | POA: Diagnosis not present

## 2022-10-20 DIAGNOSIS — Z4781 Encounter for orthopedic aftercare following surgical amputation: Secondary | ICD-10-CM | POA: Diagnosis not present

## 2022-10-20 DIAGNOSIS — I110 Hypertensive heart disease with heart failure: Secondary | ICD-10-CM | POA: Diagnosis not present

## 2022-10-20 DIAGNOSIS — E1169 Type 2 diabetes mellitus with other specified complication: Secondary | ICD-10-CM | POA: Diagnosis not present

## 2022-10-20 DIAGNOSIS — E1152 Type 2 diabetes mellitus with diabetic peripheral angiopathy with gangrene: Secondary | ICD-10-CM | POA: Diagnosis not present

## 2022-10-20 DIAGNOSIS — K861 Other chronic pancreatitis: Secondary | ICD-10-CM | POA: Diagnosis not present

## 2022-10-20 DIAGNOSIS — M86171 Other acute osteomyelitis, right ankle and foot: Secondary | ICD-10-CM | POA: Diagnosis not present

## 2022-10-25 ENCOUNTER — Ambulatory Visit (INDEPENDENT_AMBULATORY_CARE_PROVIDER_SITE_OTHER): Payer: Medicare HMO | Admitting: Nurse Practitioner

## 2022-10-26 ENCOUNTER — Emergency Department: Payer: Medicare HMO

## 2022-10-26 ENCOUNTER — Inpatient Hospital Stay
Admission: EM | Admit: 2022-10-26 | Discharge: 2022-11-02 | DRG: 065 | Disposition: A | Discharge status: Skilled Nursing Facility | Payer: Medicare HMO | Attending: Internal Medicine | Admitting: Internal Medicine

## 2022-10-26 ENCOUNTER — Emergency Department
Admit: 2022-10-26 | Discharge: 2022-10-26 | Disposition: A | Discharge status: Home / Self Care | Payer: Medicare HMO | Attending: Neurology | Admitting: Neurology

## 2022-10-26 ENCOUNTER — Inpatient Hospital Stay: Payer: Medicare HMO

## 2022-10-26 ENCOUNTER — Encounter: Payer: Self-pay | Admitting: Internal Medicine

## 2022-10-26 DIAGNOSIS — Z743 Need for continuous supervision: Secondary | ICD-10-CM | POA: Diagnosis not present

## 2022-10-26 DIAGNOSIS — Z515 Encounter for palliative care: Secondary | ICD-10-CM | POA: Diagnosis not present

## 2022-10-26 DIAGNOSIS — R001 Bradycardia, unspecified: Secondary | ICD-10-CM | POA: Diagnosis present

## 2022-10-26 DIAGNOSIS — M4802 Spinal stenosis, cervical region: Secondary | ICD-10-CM | POA: Diagnosis present

## 2022-10-26 DIAGNOSIS — Z993 Dependence on wheelchair: Secondary | ICD-10-CM

## 2022-10-26 DIAGNOSIS — G959 Disease of spinal cord, unspecified: Secondary | ICD-10-CM | POA: Diagnosis not present

## 2022-10-26 DIAGNOSIS — E785 Hyperlipidemia, unspecified: Secondary | ICD-10-CM | POA: Diagnosis present

## 2022-10-26 DIAGNOSIS — E876 Hypokalemia: Secondary | ICD-10-CM | POA: Diagnosis present

## 2022-10-26 DIAGNOSIS — R1310 Dysphagia, unspecified: Secondary | ICD-10-CM | POA: Diagnosis present

## 2022-10-26 DIAGNOSIS — R0689 Other abnormalities of breathing: Secondary | ICD-10-CM | POA: Diagnosis present

## 2022-10-26 DIAGNOSIS — I130 Hypertensive heart and chronic kidney disease with heart failure and stage 1 through stage 4 chronic kidney disease, or unspecified chronic kidney disease: Secondary | ICD-10-CM | POA: Diagnosis not present

## 2022-10-26 DIAGNOSIS — I639 Cerebral infarction, unspecified: Secondary | ICD-10-CM

## 2022-10-26 DIAGNOSIS — I429 Cardiomyopathy, unspecified: Secondary | ICD-10-CM | POA: Diagnosis present

## 2022-10-26 DIAGNOSIS — I651 Occlusion and stenosis of basilar artery: Secondary | ICD-10-CM | POA: Diagnosis not present

## 2022-10-26 DIAGNOSIS — E119 Type 2 diabetes mellitus without complications: Secondary | ICD-10-CM | POA: Diagnosis not present

## 2022-10-26 DIAGNOSIS — R4182 Altered mental status, unspecified: Secondary | ICD-10-CM | POA: Diagnosis not present

## 2022-10-26 DIAGNOSIS — I63511 Cerebral infarction due to unspecified occlusion or stenosis of right middle cerebral artery: Secondary | ICD-10-CM | POA: Diagnosis not present

## 2022-10-26 DIAGNOSIS — Z9049 Acquired absence of other specified parts of digestive tract: Secondary | ICD-10-CM

## 2022-10-26 DIAGNOSIS — I63411 Cerebral infarction due to embolism of right middle cerebral artery: Secondary | ICD-10-CM | POA: Diagnosis not present

## 2022-10-26 DIAGNOSIS — Z7189 Other specified counseling: Secondary | ICD-10-CM | POA: Diagnosis not present

## 2022-10-26 DIAGNOSIS — I517 Cardiomegaly: Secondary | ICD-10-CM | POA: Diagnosis not present

## 2022-10-26 DIAGNOSIS — I10 Essential (primary) hypertension: Secondary | ICD-10-CM | POA: Diagnosis not present

## 2022-10-26 DIAGNOSIS — I4821 Permanent atrial fibrillation: Secondary | ICD-10-CM | POA: Diagnosis not present

## 2022-10-26 DIAGNOSIS — M47812 Spondylosis without myelopathy or radiculopathy, cervical region: Secondary | ICD-10-CM | POA: Diagnosis not present

## 2022-10-26 DIAGNOSIS — R29710 NIHSS score 10: Secondary | ICD-10-CM | POA: Diagnosis present

## 2022-10-26 DIAGNOSIS — F17220 Nicotine dependence, chewing tobacco, uncomplicated: Secondary | ICD-10-CM | POA: Diagnosis present

## 2022-10-26 DIAGNOSIS — E1142 Type 2 diabetes mellitus with diabetic polyneuropathy: Secondary | ICD-10-CM | POA: Diagnosis present

## 2022-10-26 DIAGNOSIS — Z89611 Acquired absence of right leg above knee: Secondary | ICD-10-CM | POA: Diagnosis not present

## 2022-10-26 DIAGNOSIS — I4891 Unspecified atrial fibrillation: Secondary | ICD-10-CM | POA: Diagnosis not present

## 2022-10-26 DIAGNOSIS — E11649 Type 2 diabetes mellitus with hypoglycemia without coma: Secondary | ICD-10-CM | POA: Diagnosis not present

## 2022-10-26 DIAGNOSIS — R5383 Other fatigue: Secondary | ICD-10-CM | POA: Diagnosis not present

## 2022-10-26 DIAGNOSIS — I6523 Occlusion and stenosis of bilateral carotid arteries: Secondary | ICD-10-CM | POA: Diagnosis not present

## 2022-10-26 DIAGNOSIS — E1122 Type 2 diabetes mellitus with diabetic chronic kidney disease: Secondary | ICD-10-CM | POA: Diagnosis present

## 2022-10-26 DIAGNOSIS — M4312 Spondylolisthesis, cervical region: Secondary | ICD-10-CM | POA: Diagnosis not present

## 2022-10-26 DIAGNOSIS — Z79899 Other long term (current) drug therapy: Secondary | ICD-10-CM

## 2022-10-26 DIAGNOSIS — Z4781 Encounter for orthopedic aftercare following surgical amputation: Secondary | ICD-10-CM | POA: Diagnosis not present

## 2022-10-26 DIAGNOSIS — F03911 Unspecified dementia, unspecified severity, with agitation: Secondary | ICD-10-CM | POA: Diagnosis present

## 2022-10-26 DIAGNOSIS — Z89612 Acquired absence of left leg above knee: Secondary | ICD-10-CM

## 2022-10-26 DIAGNOSIS — J45909 Unspecified asthma, uncomplicated: Secondary | ICD-10-CM | POA: Diagnosis not present

## 2022-10-26 DIAGNOSIS — Z7901 Long term (current) use of anticoagulants: Secondary | ICD-10-CM | POA: Diagnosis not present

## 2022-10-26 DIAGNOSIS — E1151 Type 2 diabetes mellitus with diabetic peripheral angiopathy without gangrene: Secondary | ICD-10-CM | POA: Diagnosis present

## 2022-10-26 DIAGNOSIS — Z7984 Long term (current) use of oral hypoglycemic drugs: Secondary | ICD-10-CM

## 2022-10-26 DIAGNOSIS — I6389 Other cerebral infarction: Secondary | ICD-10-CM | POA: Diagnosis not present

## 2022-10-26 DIAGNOSIS — K861 Other chronic pancreatitis: Secondary | ICD-10-CM | POA: Diagnosis not present

## 2022-10-26 DIAGNOSIS — M4712 Other spondylosis with myelopathy, cervical region: Secondary | ICD-10-CM | POA: Diagnosis present

## 2022-10-26 DIAGNOSIS — Z8673 Personal history of transient ischemic attack (TIA), and cerebral infarction without residual deficits: Secondary | ICD-10-CM

## 2022-10-26 DIAGNOSIS — I6782 Cerebral ischemia: Secondary | ICD-10-CM | POA: Diagnosis not present

## 2022-10-26 DIAGNOSIS — I5032 Chronic diastolic (congestive) heart failure: Secondary | ICD-10-CM | POA: Diagnosis present

## 2022-10-26 DIAGNOSIS — N182 Chronic kidney disease, stage 2 (mild): Secondary | ICD-10-CM | POA: Diagnosis present

## 2022-10-26 DIAGNOSIS — I6501 Occlusion and stenosis of right vertebral artery: Secondary | ICD-10-CM | POA: Diagnosis not present

## 2022-10-26 DIAGNOSIS — I480 Paroxysmal atrial fibrillation: Secondary | ICD-10-CM | POA: Diagnosis not present

## 2022-10-26 DIAGNOSIS — I6381 Other cerebral infarction due to occlusion or stenosis of small artery: Secondary | ICD-10-CM | POA: Diagnosis not present

## 2022-10-26 DIAGNOSIS — Z7982 Long term (current) use of aspirin: Secondary | ICD-10-CM

## 2022-10-26 LAB — CBC WITH DIFFERENTIAL/PLATELET
Abs Immature Granulocytes: 0.02 10*3/uL (ref 0.00–0.07)
Basophils Absolute: 0 10*3/uL (ref 0.0–0.1)
Basophils Relative: 0 %
Eosinophils Absolute: 0.1 10*3/uL (ref 0.0–0.5)
Eosinophils Relative: 2 %
HCT: 36.3 % — ABNORMAL LOW (ref 39.0–52.0)
Hemoglobin: 12.4 g/dL — ABNORMAL LOW (ref 13.0–17.0)
Immature Granulocytes: 0 %
Lymphocytes Relative: 30 %
Lymphs Abs: 2.1 10*3/uL (ref 0.7–4.0)
MCH: 28.6 pg (ref 26.0–34.0)
MCHC: 34.2 g/dL (ref 30.0–36.0)
MCV: 83.6 fL (ref 80.0–100.0)
Monocytes Absolute: 0.6 10*3/uL (ref 0.1–1.0)
Monocytes Relative: 8 %
Neutro Abs: 4.2 10*3/uL (ref 1.7–7.7)
Neutrophils Relative %: 60 %
Platelets: 163 10*3/uL (ref 150–400)
RBC: 4.34 MIL/uL (ref 4.22–5.81)
RDW: 14.9 % (ref 11.5–15.5)
WBC: 7.1 10*3/uL (ref 4.0–10.5)
nRBC: 0 % (ref 0.0–0.2)

## 2022-10-26 LAB — ECHOCARDIOGRAM COMPLETE: Height: 72 in

## 2022-10-26 LAB — LIPID PANEL
Cholesterol: 137 mg/dL (ref 0–200)
HDL: 38 mg/dL — ABNORMAL LOW (ref >40)
LDL Cholesterol: 87 mg/dL (ref 0–99)
Total CHOL/HDL Ratio: 3.6 RATIO
Triglycerides: 59 mg/dL (ref <150)
VLDL: 12 mg/dL (ref 0–40)

## 2022-10-26 LAB — LACTIC ACID, PLASMA
Lactic Acid, Venous: 1.3 mmol/L (ref 0.5–1.9)
Lactic Acid, Venous: 1.4 mmol/L (ref 0.5–1.9)

## 2022-10-26 LAB — URINALYSIS, ROUTINE W REFLEX MICROSCOPIC
Bilirubin Urine: NEGATIVE
Glucose, UA: NEGATIVE mg/dL
Hgb urine dipstick: NEGATIVE
Ketones, ur: NEGATIVE mg/dL
Leukocytes,Ua: NEGATIVE
Nitrite: NEGATIVE
Protein, ur: NEGATIVE mg/dL
Specific Gravity, Urine: 1.035 — ABNORMAL HIGH (ref 1.005–1.030)
pH: 6 (ref 5.0–8.0)

## 2022-10-26 LAB — COMPREHENSIVE METABOLIC PANEL
ALT: 8 U/L (ref 0–44)
AST: 14 U/L — ABNORMAL LOW (ref 15–41)
Albumin: 3.2 g/dL — ABNORMAL LOW (ref 3.5–5.0)
Alkaline Phosphatase: 67 U/L (ref 38–126)
Anion gap: 9 (ref 5–15)
BUN: 6 mg/dL — ABNORMAL LOW (ref 8–23)
CO2: 24 mmol/L (ref 22–32)
Calcium: 8.7 mg/dL — ABNORMAL LOW (ref 8.9–10.3)
Chloride: 101 mmol/L (ref 98–111)
Creatinine, Ser: 0.88 mg/dL (ref 0.61–1.24)
GFR, Estimated: >60 mL/min (ref >60)
Glucose, Bld: 81 mg/dL (ref 70–99)
Potassium: 3.1 mmol/L — ABNORMAL LOW (ref 3.5–5.1)
Sodium: 134 mmol/L — ABNORMAL LOW (ref 135–145)
Total Bilirubin: 1.3 mg/dL — ABNORMAL HIGH (ref 0.3–1.2)
Total Protein: 6.9 g/dL (ref 6.5–8.1)

## 2022-10-26 LAB — BLOOD GAS, VENOUS
Acid-Base Excess: 5 mmol/L — ABNORMAL HIGH (ref 0.0–2.0)
Bicarbonate: 31.8 mmol/L — ABNORMAL HIGH (ref 20.0–28.0)
O2 Saturation: 67.7 %
Patient temperature: 37
pCO2, Ven: 55 mmHg (ref 44–60)
pH, Ven: 7.37 (ref 7.25–7.43)
pO2, Ven: 39 mmHg (ref 32–45)

## 2022-10-26 LAB — TROPONIN I (HIGH SENSITIVITY)
Troponin I (High Sensitivity): 12 ng/L (ref <18)
Troponin I (High Sensitivity): 12 ng/L (ref <18)

## 2022-10-26 LAB — HEMOGLOBIN A1C
Hgb A1c MFr Bld: 5.1 % (ref 4.8–5.6)
Mean Plasma Glucose: 99.67 mg/dL

## 2022-10-26 MED ORDER — ACETAMINOPHEN 325 MG PO TABS: 650.0000 mg | ORAL_TABLET | ORAL | Status: DC | PRN

## 2022-10-26 MED ORDER — ENOXAPARIN SODIUM 40 MG/0.4ML IJ SOSY
40.0000 mg | PREFILLED_SYRINGE | INTRAMUSCULAR | Status: DC
  Administered 2022-10-26 – 2022-10-31 (×6): 40 mg via SUBCUTANEOUS
  Filled 2022-10-26 (×6): qty 0.4

## 2022-10-26 MED ORDER — ASPIRIN 300 MG RE SUPP
300.0000 mg | Freq: Every day | RECTAL | Status: DC
  Filled 2022-10-26 (×5): qty 1

## 2022-10-26 MED ORDER — IOHEXOL 350 MG/ML SOLN
75.0000 mL | Freq: Once | INTRAVENOUS | Status: AC | PRN
  Administered 2022-10-26: 75 mL via INTRAVENOUS

## 2022-10-26 MED ORDER — STROKE: EARLY STAGES OF RECOVERY BOOK: Freq: Once | Status: AC

## 2022-10-26 MED ORDER — ASPIRIN 325 MG PO TABS
325.0000 mg | ORAL_TABLET | Freq: Every day | ORAL | Status: DC
  Administered 2022-10-27 – 2022-11-01 (×6): 325 mg via ORAL
  Filled 2022-10-26 (×6): qty 1

## 2022-10-26 MED ORDER — ASPIRIN 300 MG RE SUPP: 300.0000 mg | Freq: Every day | RECTAL | Status: DC

## 2022-10-26 MED ORDER — SENNOSIDES-DOCUSATE SODIUM 8.6-50 MG PO TABS: 1.0000 | ORAL_TABLET | Freq: Every evening | ORAL | Status: DC | PRN

## 2022-10-26 MED ORDER — ASPIRIN 325 MG PO TABS: 325.0000 mg | ORAL_TABLET | Freq: Every day | ORAL | Status: DC

## 2022-10-26 MED ORDER — DIGOXIN 125 MCG PO TABS
0.1250 mg | ORAL_TABLET | Freq: Every day | ORAL | Status: DC
  Administered 2022-10-30 – 2022-11-02 (×4): 0.125 mg via ORAL
  Filled 2022-10-26 (×8): qty 1

## 2022-10-26 MED ORDER — ACETAMINOPHEN 650 MG RE SUPP: 650.0000 mg | RECTAL | Status: DC | PRN

## 2022-10-26 MED ORDER — ACETAMINOPHEN 160 MG/5ML PO SOLN: 650.0000 mg | ORAL | Status: DC | PRN

## 2022-10-26 MED ORDER — IPRATROPIUM-ALBUTEROL 0.5-2.5 (3) MG/3ML IN SOLN
3.0000 mL | Freq: Once | RESPIRATORY_TRACT | Status: AC
  Administered 2022-10-26: 3 mL via RESPIRATORY_TRACT
  Filled 2022-10-26: qty 3

## 2022-10-26 MED ORDER — IPRATROPIUM-ALBUTEROL 0.5-2.5 (3) MG/3ML IN SOLN: 3.0000 mL | Freq: Four times a day (QID) | RESPIRATORY_TRACT | Status: DC | PRN

## 2022-10-26 MED ORDER — ATORVASTATIN CALCIUM 20 MG PO TABS
40.0000 mg | ORAL_TABLET | Freq: Every day | ORAL | Status: DC
  Administered 2022-10-26 – 2022-11-02 (×8): 40 mg via ORAL
  Filled 2022-10-26 (×8): qty 2

## 2022-10-26 MED ORDER — POTASSIUM CHLORIDE 20 MEQ PO PACK
40.0000 meq | PACK | Freq: Once | ORAL | Status: AC
  Administered 2022-10-26: 40 meq via ORAL
  Filled 2022-10-26: qty 2

## 2022-10-26 NOTE — Plan of Care (Signed)
Okay to hold off on MRI cervical spine overnight.  Given patient's somnolence hesitate to add sedating medications for imaging, feel that sedation would put him at high risk for aspiration and deterioration.  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  May consider reattempting this imaging based on patient's clinical course.   In the interim suggest C-spine precautions  Brooke Dare MD-PhD [facility] [phone] Available 7 AM to 7 PM, outside these hours please contact Neurologist on call listed on AMION

## 2022-10-26 NOTE — Assessment & Plan Note (Signed)
-   DuoNebs as needed for wheezing

## 2022-10-26 NOTE — ED Triage Notes (Signed)
Family reports that patient has been more lethargic and confused since yesterday; Patient agreed that he does feel more confused than normal; Patient falls asleep quickly during triage and oxygen sats drop to 88% but quickly rise to 100% when awake and talking

## 2022-10-26 NOTE — Assessment & Plan Note (Signed)
Mild hypercarbia noted on VBG with pCO2 of 55.  Will need to monitor for any worsening given presentation of altered mental status  - Repeat VBG in the a.m. or for any sudden change in mental status

## 2022-10-26 NOTE — Assessment & Plan Note (Signed)
-   Hold home antihypertensives to allow for permissive hypertension 

## 2022-10-26 NOTE — Assessment & Plan Note (Addendum)
-   Hold home antiglycemic agents - A1c pending - SSI, sensitive 

## 2022-10-26 NOTE — ED Notes (Signed)
Pt at CT

## 2022-10-26 NOTE — Progress Notes (Signed)
MR brain scanned, but patient movement became so great that the images became undiagnostic. Unable to get him to hold still despite talking to him several times. He seemed slightly confused and kept complaining of phantom pains in his right toes. Pt sent back up to room and attending notified. MR cervical spine pending.

## 2022-10-26 NOTE — Progress Notes (Signed)
  Progress Note  Patient was agitated for MRI of the C-spine and unable to be completed.  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   At this time, the the risk of sedating patient to obtain imaging would outweigh the benefits, as he is not a surgical candidate at this point and it would not change management.  Can readdress in the coming days if indicated.  Discussed with neurology, who is in agreement.  Author: Verdene Lennert, MD 10/26/2022 6:30 PM  For on call review [email or web address].

## 2022-10-26 NOTE — ED Notes (Signed)
Pt gone at scans

## 2022-10-26 NOTE — Assessment & Plan Note (Addendum)
Patient presenting with 36 hours of altered mental status and increased lethargy.  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  Outside of the window for thrombectomy or tPA.  - Neurology consulted; appreciate their recommendations - MRI brain pending - CTA head/neck pending - Telemetry monitoring - Allow for permissive HTN (systolic < 220 and diastolic < 120)  - Echocardiogram  - A1C and Lipid panel  - ASA 325 mg rectal versus p.o., followed by 81 mg daily was able to swallow - High intensity stain when able to swallow - Gentle IV fluids - PT/OT/SLP

## 2022-10-26 NOTE — ED Notes (Signed)
Echo at bedside, family at bedside.

## 2022-10-26 NOTE — H&P (Addendum)
History and Physical    Patient: [name] [name] ZOX:[id number] DOB: 11-25-1940 DOA: 10/26/2022 DOS: the patient was seen and examined on 10/26/2022 PCP: Luciana Axe, NP  Patient coming from: Home  Chief Complaint:  Chief Complaint  Patient presents with   Fatigue    Family reports that patient has been more lethargic and confused since yesterday; Patient agreed that he does feel more confused than normal; Patient falls asleep quickly during triage and oxygen sats drop to 88% but quickly rise to 100% when awake and talking   HPI: [name] [name] is a 82 y.o. male with medical history significant of HTN, CHF, T2DM, HLD, pancreatitis, atrial fibrillation on Xarelto, CKD Stage 2, who presents to the ED due to altered mental status.    History obtained from patient's wife at bedside due to patient's altered mental status.  Mrs. Kleinpeter states patient was in his baseline state of health on the evening of 7/2 and woke up on 7/3 with altered mental status and lethargy.  She noted that he was unable to grab a spoon and seemed to be over or under reaching when trying to grab it.  In addition, he seemed weak all over.  Due to persistence of symptoms, she brought him to the ER.  No recent illnesses reported.  ED Course:  On arrival to the ED, patient was hypertensive at 154/103 with heart rate 77.  He was saturating at 100% room air.  He was afebrile at 97.8.  Initial workup notable for hemoglobin of 12.4, potassium 3.1, BUN 6, creatinine 0.88 with GFR above 60.  VBG with pH of 7.37 with pCO2 of 55.  Troponin negative at 12.  Lactic acid 1.3. Chest x-ray was obtained with no active disease.  CT of the head was obtained with evidence of a subacute infarct involving the right parietal lobe and right basal ganglia with asymmetric density within the right MCA concerning for thrombus.  Neurology consulted.  TRH contacted for admission.  Review of Systems: Unable to be obtained due to altered mental status  Past  Medical History:  Diagnosis Date   CHF (congestive heart failure) (HCC)    Diabetes (HCC)    Dysrhythmia    AFIB   HTN (hypertension)    Infection    LEG   Pancreatitis    Past Surgical History:  Procedure Laterality Date   AMPUTATION Right 03/09/2021   Procedure: AMPUTATION FOOT;  Surgeon: Annice Needy, MD;  Location: [facility];  Service: General;  Laterality: Right;   AMPUTATION Right 03/11/2021   Procedure: AMPUTATION ABOVE KNEE;  Surgeon: Annice Needy, MD;  Location: [facility];  Service: General;  Laterality: Right;   AMPUTATION Left 08/13/2022   Procedure: AMPUTATION ABOVE KNEE;  Surgeon: Bertram Denver, MD;  Location: [facility];  Service: Vascular;  Laterality: Left;   CATARACT EXTRACTION W/PHACO Right 03/08/2017   Procedure: CATARACT EXTRACTION PHACO AND INTRAOCULAR LENS PLACEMENT (IOC);  Surgeon: Nevada Crane, MD;  Location: [facility];  Service: Ophthalmology;  Laterality: Right;  Lot# [id number] H [place]: 00:30.9 AP%: 8.2 CDE: 2.54   CATARACT EXTRACTION W/PHACO Left 04/04/2017   Procedure: CATARACT EXTRACTION PHACO AND INTRAOCULAR LENS PLACEMENT (IOC);  Surgeon: Nevada Crane, MD;  Location: [facility];  Service: Ophthalmology;  Laterality: Left;  [place] 00:31.4 AP% 12.0 CDE 3.78 Fluid Pack lot # [id number]   CHOLECYSTECTOMY     EYE SURGERY     right eye   LOWER EXTREMITY ANGIOGRAPHY Right 02/10/2021   Procedure: LOWER  EXTREMITY ANGIOGRAPHY;  Surgeon: Annice Needy, MD;  Location: [facility];  Service: Cardiovascular;  Laterality: Right;   LOWER EXTREMITY ANGIOGRAPHY Left 07/03/2022   Procedure: Lower Extremity Angiography;  Surgeon: Annice Needy, MD;  Location: [facility];  Service: Cardiovascular;  Laterality: Left;   LOWER EXTREMITY ANGIOGRAPHY Left 07/24/2022   Procedure: Lower Extremity Angiography;  Surgeon: Annice Needy, MD;  Location: [facility];  Service: Cardiovascular;  Laterality: Left;   LOWER EXTREMITY ANGIOGRAPHY Left 08/11/2022    Procedure: Lower Extremity Angiography;  Surgeon: Annice Needy, MD;  Location: [facility];  Service: Cardiovascular;  Laterality: Left;   Social History:  reports that he has never smoked. His smokeless tobacco use includes chew. He reports that he does not drink alcohol and does not use drugs.  No Known Allergies  No family history on file.  Prior to Admission medications   Medication Sig Start Date End Date Taking? Authorizing Provider  ascorbic acid (VITAMIN C) 500 MG tablet Take 500 mg by mouth daily. 10/06/21   [provider]  aspirin EC 81 MG tablet Take 1 tablet (81 mg total) by mouth daily. Swallow whole. 07/03/22 07/03/23  Annice Needy, MD  atorvastatin (LIPITOR) 10 MG tablet Take 1 tablet (10 mg total) by mouth daily. 02/10/21 08/10/22  Annice Needy, MD  carvedilol (COREG) 6.25 MG tablet Take 6.25 mg 2 (two) times daily by mouth. 12/19/16   [provider]  digoxin (LANOXIN) 0.125 MG tablet Take 0.125 mcg daily by mouth. 12/19/16   [provider]  ferrous sulfate 325 (65 FE) MG tablet Take by mouth. 05/11/21 06/06/22  [provider]  furosemide (LASIX) 20 MG tablet Take 20 mg daily by mouth. 12/19/16   [provider]  gabapentin (NEURONTIN) 100 MG capsule Take 1 capsule (100 mg total) by mouth 3 (three) times daily. 03/16/21 08/10/22  Charise Killian, MD  lisinopril (PRINIVIL,ZESTRIL) 10 MG tablet Take 15 mg by mouth daily.    [provider]  metFORMIN (GLUCOPHAGE) 1000 MG tablet Take 1,000 mg by mouth daily.    [provider]  rivaroxaban (XARELTO) 20 MG TABS tablet Take 20 mg daily by mouth.    [provider]  sulfamethoxazole-trimethoprim (BACTRIM DS) 800-160 MG tablet Take 1 tablet by mouth 2 (two) times daily. 09/14/22   Georgiana Spinner, NP  traMADol (ULTRAM) 50 MG tablet Take 1 tablet (50 mg total) by mouth every 8 (eight) hours as needed for severe pain or moderate pain. 08/15/22   Enedina Finner, MD   vitamin B-12 (CYANOCOBALAMIN) 1000 MCG tablet Take 1,000 mcg by mouth daily.    [provider]    Physical Exam: Vitals:   10/26/22 1119 10/26/22 1401 10/26/22 1445 10/26/22 1500  BP:  (!) 164/75 (!) 171/77 (!) 166/71  Pulse:  (!) 57    Resp:  16 13 16   Temp:      TempSrc:      SpO2:  98%    Weight: 89.1 kg     Height: 6' (1.829 m)      Physical Exam Vitals and nursing note reviewed.  Constitutional:      Appearance: He is normal weight. He is ill-appearing (Chronically).  HENT:     Head: Normocephalic and atraumatic.  Eyes:     Extraocular Movements: Extraocular movements intact.     Comments: Extraocular movements intact, however right gaze preference noted  Cardiovascular:  Rate and Rhythm: Bradycardia present. Rhythm irregular.     Heart sounds: No murmur heard. Pulmonary:     Effort: Pulmonary effort is normal. No respiratory distress.     Breath sounds: Normal breath sounds.  Abdominal:     General: Bowel sounds are normal. There is no distension.     Palpations: Abdomen is soft.     Tenderness: There is no abdominal tenderness.  Musculoskeletal:     Right Lower Extremity: Right leg is amputated above knee.     Left Lower Extremity: Left leg is amputated above knee.  Feet:     Comments: Bilateral amputation sites clean, dry and well-healed. Skin:    General: Skin is warm and dry.  Neurological:     Mental Status: He is alert.     Comments:  Patient is nonverbal, but following commands although slowly Right gaze preference noted but able to cross midline when asked 3/5 strength of right upper extremity, 2/5 strength of left upper extremity Lower extremity strength testing limited by amputations    Data Reviewed: CBC with WBC 7.1, hemoglobin 12.4, MCV 83 and platelets 163 CMP with sodium of 134, potassium 3.1, bicarb 24, BUN 6, creatinine 0.88, albumin 3.2, AST 14, ALT 8 total bilirubin 1.3 and GFR above 60 Troponin negative at 12 Lactic acid  1.3  EKG with atrial fibrillation with rate of 69.  No ischemic appearing ST or T wave changes.  Left axis deviation  CT HEAD WO CONTRAST ( )  Result Date: 10/26/2022 CLINICAL DATA:  Mental status change. EXAM: CT HEAD WITHOUT CONTRAST TECHNIQUE: Contiguous axial images were obtained from the base of the skull through the vertex without intravenous contrast. RADIATION DOSE REDUCTION: This exam was performed according to the departmental dose-optimization program which includes automated exposure control, adjustment of the mA and/or kV according to patient size and/or use of iterative reconstruction technique. COMPARISON:  08/10/2022 FINDINGS: Brain: Signs subacute infarct is identified involving the right parietal lobe with cortical and subcortical low attenuation edema with effacement of the sulci, image 18/2. Subacute infarct with low-attenuation edema involving the right basal ganglia is also identified and appears new from the previous exam, image 16/2. No sign of acute intracranial hemorrhage. There is moderate diffuse low-attenuation within the subcortical and periventricular white matter compatible with chronic microvascular disease. Prominence of the sulci and ventricles identified compatible with brain atrophy. Vascular: Asymmetric increased density within a branch of the right middle cerebral artery is noted, image 15/2. Skull: Normal. Negative for fracture or focal lesion. Sinuses/Orbits: No acute finding. Other: None. IMPRESSION: 1. 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 2. Asymmetric increased density within a branch of the right middle cerebral artery is identified, concerning for thrombus. 3. No sign of acute intracranial hemorrhage. 4. Chronic microvascular disease and brain atrophy. Critical Value/emergent results were called by telephone at the time of interpretation on 10/26/2022 at 1:09 pm to provider [name] [name] , who verbally acknowledged  these results. Electronically Signed   By: Signa Kell M.D.   On: 10/26/2022 13:09   DG Chest Port 1 View  Result Date: 10/26/2022 CLINICAL DATA:  Altered mental status EXAM: PORTABLE CHEST 1 VIEW COMPARISON:  Chest x-ray dated August 09, 2022 FINDINGS: Cardiac and mediastinal contours are within normal limits for exam technique. Lungs are clear. No evidence of pleural effusion or pneumothorax. Patient head position somewhat limits evaluation of the upper lungs. IMPRESSION: No active disease. Electronically Signed   By: Tacey Ruiz  Strickland M.D.   On: 10/26/2022 12:37    Results are pending, will review when available.  Assessment and Plan:  * Acute CVA (cerebrovascular accident) [facility]) Patient presenting with 36 hours of altered mental status and increased lethargy.  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  Outside of the window for thrombectomy or tPA.  - Neurology consulted; appreciate their recommendations - MRI brain pending - CTA head/neck pending - Telemetry monitoring - Allow for permissive HTN (systolic < 220 and diastolic < 120)  - Echocardiogram  - A1C and Lipid panel  - ASA 325 mg rectal versus p.o., followed by 81 mg daily was able to swallow - High intensity stain when able to swallow - Gentle IV fluids - PT/OT/SLP  Atrial fibrillation (HCC) Patient remains in atrial fibrillation, however with slowed ventricular response  - Hold home Xarelto in the setting of active CVA pending neurology recommendations - Hold home rate controlling agents for permissive hypertension and bradycardia - Hold home digoxin until SLP evaluation  Hypercarbia Mild hypercarbia noted on VBG with pCO2 of 55.  Will need to monitor for any worsening given presentation of altered mental status  - Repeat VBG in the a.m. or for any sudden change in mental status  Chronic diastolic CHF (congestive heart failure) (HCC) Patient appears euvolemic at this time.  Will hold off on any  diuretic.  Will monitor closely while receiving gentle fluids  Essential hypertension - Hold home antihypertensives to allow for permissive hypertension  Type 2 diabetes mellitus with peripheral neuropathy (HCC) - Hold home antiglycemic agents - A1c pending - SSI, sensitive  Asthma - DuoNebs as needed for wheezing  Advance Care Planning:   Code Status: Full Code discussed CODE STATUS with patient's wife at bedside.  She understands that there is a chance of poor recovery, however she would like for him to remain as a full code for now.  Consults: Neurology  Family Communication: [name] and brother-in-law updated at bedside  Severity of Illness: The appropriate patient status for this patient is INPATIENT. Inpatient status is judged to be reasonable and necessary in order to provide the required intensity of service to ensure the patient's safety. The patient's presenting symptoms, physical exam findings, and initial radiographic and laboratory data in the context of their chronic comorbidities is felt to place them at high risk for further clinical deterioration. Furthermore, it is not anticipated that the patient will be medically stable for discharge from the hospital within 2 midnights of admission.   * I certify that at the point of admission it is my clinical judgment that the patient will require inpatient hospital care spanning beyond 2 midnights from the point of admission due to high intensity of service, high risk for further deterioration and high frequency of surveillance required.*  Author: Verdene Lennert, MD 10/26/2022 3:12 PM  For on call review [email or web address].

## 2022-10-26 NOTE — ED Provider Notes (Signed)
[facility] Provider Note    Event Date/Time   First MD Initiated Contact with Patient 10/26/22 1203     (approximate)   History   Fatigue (Family reports that patient has been more lethargic and confused since yesterday; Patient agreed that he does feel more confused than normal; Patient falls asleep quickly during triage and oxygen sats drop to 88% but quickly rise to 100% when awake and talking)   HPI  [name] [name] is a 82 y.o. male with extensive past medical history including cardiomyopathy, A-fib, CHF, diabetes presents to the ER for generalized fatigue and malaise past 24 to 48 hours.  Family reports that he was particularly confused and uncooperative starting yesterday morning and woke up this way.  No cough or congestion.  Does not wear home oxygen but was reportedly found to have O2 sats dipping into the 80s in triage but then would come back up to 100%.  No report of any trauma.  No dysuria.  No abdominal pain.     Physical Exam   Triage Vital Signs: ED Triage Vitals  Enc Vitals Group     BP 10/26/22 1055 (!) 154/103     Pulse Rate 10/26/22 1055 77     Resp 10/26/22 1055 16     Temp 10/26/22 1055 97.8 F (36.6 C)     Temp Source 10/26/22 1055 Oral     SpO2 10/26/22 1055 100 %     Weight 10/26/22 1119 196 lb 6.9 oz (89.1 kg)     Height 10/26/22 1119 6' (1.829 m)     Head Circumference --      Peak Flow --      Pain Score --      Pain Loc --      Pain Edu? --      Excl. in GC? --     Most recent vital signs: Vitals:   10/26/22 1445 10/26/22 1500  BP: (!) 171/77 (!) 166/71  Pulse:    Resp: 13 16  Temp:    SpO2:       Constitutional: Alert  Eyes: Conjunctivae are normal.  Head: Atraumatic. Nose: No congestion/rhinnorhea. Mouth/Throat: Mucous membranes are moist.   Neck: Painless ROM.  Cardiovascular:   Good peripheral circulation. Respiratory: Normal respiratory effort.  No retractions.  Gastrointestinal: Soft and nontender.   Musculoskeletal:  no deformity Neurologic:  right gaze preference unable to cross midline, drowsy but protecting airway, decreased left grip and drift Skin:  Skin is warm, dry and intact. No rash noted. Psychiatric: Mood and affect are normal. Speech and behavior are normal.    ED Results / Procedures / Treatments   Labs (all labs ordered are listed, but only abnormal results are displayed) Labs Reviewed  CBC WITH DIFFERENTIAL/PLATELET - Abnormal; Notable for the following components:      Result Value   Hemoglobin 12.4 (*)    HCT 36.3 (*)    All other components within normal limits  COMPREHENSIVE METABOLIC PANEL - Abnormal; Notable for the following components:   Sodium 134 (*)    Potassium 3.1 (*)    BUN 6 (*)    Calcium 8.7 (*)    Albumin 3.2 (*)    AST 14 (*)    Total Bilirubin 1.3 (*)    All other components within normal limits  BLOOD GAS, VENOUS - Abnormal; Notable for the following components:   Bicarbonate 31.8 (*)    Acid-Base Excess 5.0 (*)    All  other components within normal limits  LIPID PANEL - Abnormal; Notable for the following components:   HDL 38 (*)    All other components within normal limits  LACTIC ACID, PLASMA  LACTIC ACID, PLASMA  URINALYSIS, ROUTINE W REFLEX MICROSCOPIC  HEMOGLOBIN A1C  TROPONIN I (HIGH SENSITIVITY)  TROPONIN I (HIGH SENSITIVITY)     EKG  ED ECG REPORT I, Willy Eddy, the attending physician, personally viewed and interpreted this ECG.   Date: 10/26/2022  EKG Time: 11:19  Rate: 70  Rhythm: a fib  Axis: normal  Intervals: normal  ST&T Change: nonspecific st abn    RADIOLOGY Please see ED Course for my review and interpretation.  I personally reviewed all radiographic images ordered to evaluate for the above acute complaints and reviewed radiology reports and findings.  These findings were personally discussed with the patient.  Please see medical record for radiology report.    PROCEDURES:  Critical  Care performed: No  Procedures   MEDICATIONS ORDERED IN ED: Medications   stroke: early stages of recovery book (has no administration in time range)  aspirin suppository 300 mg (has no administration in time range)    Or  aspirin tablet 325 mg (has no administration in time range)  acetaminophen (TYLENOL) tablet 650 mg (has no administration in time range)    Or  acetaminophen (TYLENOL) 160 MG/5ML solution 650 mg (has no administration in time range)    Or  acetaminophen (TYLENOL) suppository 650 mg (has no administration in time range)  senna-docusate (Senokot-S) tablet 1 tablet (has no administration in time range)  enoxaparin (LOVENOX) injection 40 mg (has no administration in time range)  ipratropium-albuterol (DUONEB) 0.5-2.5 (3) MG/3ML nebulizer solution 3 mL (3 mLs Nebulization Given 10/26/22 1307)  iohexol (OMNIPAQUE) 350 MG/ML injection 75 mL (75 mLs Intravenous Contrast Given 10/26/22 1424)     IMPRESSION / MDM / ASSESSMENT AND PLAN / ED COURSE  I reviewed the triage vital signs and the nursing notes.                              Differential diagnosis includes, but is not limited to, Dehydration, sepsis, pna, uti, hypoglycemia, cva, drug effect, withdrawal, encephalitis  Patient presenting to the ER for evaluation of symptoms as described above.  Based on symptoms, risk factors and considered above differential, this presenting complaint could reflect a potentially life-threatening illness therefore the patient will be placed on continuous pulse oximetry and telemetry for monitoring.  Laboratory evaluation will be sent to evaluate for the above complaints.      Clinical Course as of 10/26/22 1526  Thu Oct 26, 2022  1238 Chest x-ray my review and interpretation without evidence of consolidation. [PR]  1310 CT imaging shows evidence of right parietal and basal ganglier stroke with some edema and sulcal effacement.  Further clarification with family member last known well  was Tuesday afternoon. [PR]  1334 Will consult hospitalist for admission.  Will consult neurology. [PR]    Clinical Course User Index [PR] Willy Eddy, MD     FINAL CLINICAL IMPRESSION(S) / ED DIAGNOSES   Final diagnoses:  Acute CVA (cerebrovascular accident) [facility])     Rx / DC Orders   ED Discharge Orders     None        Note:  This document was prepared using Dragon voice recognition software and may include unintentional dictation errors.    Willy Eddy, MD 10/26/22 [phone]

## 2022-10-26 NOTE — Progress Notes (Signed)
  Echocardiogram 2D Echocardiogram has been performed.  [name] [name] 10/26/2022, 3:16 PM

## 2022-10-26 NOTE — ED Notes (Signed)
Advised nurse that patient has ready bed 

## 2022-10-26 NOTE — Consult Note (Signed)
Neurology Consultation Reason for Consult: Stroke Requesting Physician: Willy Eddy   CC: Altered mental status  History is obtained from: Wife and chart review, patient too somnolent to give much history   HPI: [name] [name] is a 82 y.o. 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  On Wednesday when he woke up he was generally seem disoriented and less responsive than usual.  His condition gradually worsened over the course of Wednesday and today due to gradually worsening symptoms to brought him to the ED for further evaluation where head CT demonstrated significant stroke and neurology was consulted  LKW: Tuesday 10 PM (7/2) Thrombolytic given?: No, out of the window, completed stroke on head CT IA performed?: No, out of the window, completed stroke on head CT Premorbid modified rankin scale:      4 - Moderately severe disability. Unable to attend to own bodily needs without assistance, and unable to walk unassisted.  ROS: Unable to obtain due to altered mental status, but no complaints reported per wife.   Past Medical History:  Diagnosis Date   CHF (congestive heart failure) (HCC)    Diabetes (HCC)    Dysrhythmia    AFIB   HTN (hypertension)    Infection    LEG   Pancreatitis    Past Surgical History:  Procedure Laterality Date   AMPUTATION Right 03/09/2021   Procedure: AMPUTATION FOOT;  Surgeon: Annice Needy, MD;  Location: [facility];  Service: General;  Laterality: Right;   AMPUTATION Right 03/11/2021   Procedure: AMPUTATION ABOVE KNEE;  Surgeon: Annice Needy, MD;  Location: [facility];  Service: General;  Laterality: Right;   AMPUTATION Left 08/13/2022   Procedure: AMPUTATION ABOVE KNEE;  Surgeon: Bertram Denver, MD;  Location: [facility];  Service: Vascular;  Laterality: Left;   CATARACT EXTRACTION W/PHACO Right 03/08/2017   Procedure: CATARACT EXTRACTION PHACO AND INTRAOCULAR LENS PLACEMENT (IOC);  Surgeon: Nevada Crane, MD;  Location: [facility];  Service: Ophthalmology;  Laterality: Right;  Lot# [id number] [place]: 00:30.9 AP%: 8.2 CDE: 2.54   CATARACT EXTRACTION W/PHACO Left 04/04/2017   Procedure: CATARACT EXTRACTION PHACO AND INTRAOCULAR LENS PLACEMENT (IOC);  Surgeon: Nevada Crane, MD;  Location: [facility];  Service: Ophthalmology;  Laterality: Left;  [place] 00:31.4 AP% 12.0 CDE 3.78 Fluid Pack lot # [id number]   CHOLECYSTECTOMY     EYE SURGERY     right eye   LOWER EXTREMITY ANGIOGRAPHY Right 02/10/2021   Procedure: LOWER EXTREMITY ANGIOGRAPHY;  Surgeon: Annice Needy, MD;  Location: [facility];  Service: Cardiovascular;  Laterality: Right;   LOWER EXTREMITY ANGIOGRAPHY Left 07/03/2022   Procedure: Lower Extremity Angiography;  Surgeon: Annice Needy, MD;  Location: [facility];  Service: Cardiovascular;  Laterality: Left;   LOWER EXTREMITY ANGIOGRAPHY Left 07/24/2022   Procedure: Lower Extremity Angiography;  Surgeon: Annice Needy, MD;  Location: [facility];  Service: Cardiovascular;  Laterality: Left;   LOWER EXTREMITY ANGIOGRAPHY Left 08/11/2022   Procedure: Lower Extremity Angiography;  Surgeon: Annice Needy, MD;  Location: [facility];  Service: Cardiovascular;  Laterality: Left;   No family  history on file.   Social History:  reports that he has never smoked. His smokeless tobacco use includes chew. He reports that he does not drink alcohol and does not use drugs.   Exam: Current vital signs: BP (!) 154/103 (BP Location: Left Arm)   Pulse 77   Temp 97.8 F (36.6 C) (Oral)   Resp 16   Ht 6'  (1.829 m)   Wt 89.1 kg   SpO2 100%   BMI 26.64 kg/m  Vital signs in last 24 hours: Temp:  [97.8 F (36.6 C)] 97.8 F (36.6 C) (07/04 1055) Pulse Rate:  [77] 77 (07/04 1055) Resp:  [16] 16 (07/04 1055) BP: (154)/(103) 154/103 (07/04 1055) SpO2:  [100 %] 100 % (07/04 1055) Weight:  [89.1 kg] 89.1 kg (07/04 1119)   Physical Exam  Constitutional: Appears thin, chronically ill Psych: Minimally interactive but calm when awake  Eyes: No scleral injection HENT: No oropharyngeal obstruction.  MSK: Bilateral AKA's.  Some swelling of the left upper extremity noted Cardiovascular: Irregularly irregular rhythm, perfusing extremities well Respiratory: Effort normal, non-labored breathing, intermittent wet cough GI: Soft.  No distension. There is no tenderness.  Skin: Warm dry and intact visible skin  Neuro: Mental Status: Patient is sleepy but awakens to light stimulus, oriented to person, place, year, but reports the month is February, too sleepy to provide any significant history about recent events No signs of aphasia within limits of intermittently failing to answer due to sleepiness.  Able to follow simple commands.  Significant neglect of the left side, looks towards the right when called on the left, but able to move his left side to command Frequent yawning with reflexive movement up of the left upper extremity Cranial Nerves: II: Visual Fields notable for left hemianopia. Pupils are equal, round, and reactive to light.   III,IV, VI: Strong right gaze preference, orients only towards midline but not further VII: Facial movement is notable for very minimal/subtle left facial droop if any VIII: hearing is intact to voice X: Uvula elevates symmetrically XII: tongue is midline without atrophy or fasciculations.  Motor: Limited confrontational testing due to sleepiness/attention.  Slight drift of the bilateral upper extremities on pronator drift testing but left more than right.  Slight  drift of the left AKA.  No drift of the right AKA Sensory: Reduced responsiveness to touch of the left side Deep Tendon Reflexes: 3+ and symmetric in the brachioradialis  Cerebellar: Finger-nose intact within limits of sleepiness/attention bilaterally Gait:  Nonambulatory at baseline  NIHSS total 10 Score breakdown: 1 point for drowsiness, 1 point for incorrect month, 1 point for gaze deviation to the right, 2 points for left hemianopia, 1 point for left upper extremity weakness, 1 point for right upper extremity weakness, 1 point for left lower extremity weakness, 1 point for sensory loss on the left side, 1 point for neglect of the left side Performed at 2 PM   I have reviewed labs in epic and the results pertinent to this consultation are:  Basic Metabolic Panel: Recent Labs  Lab 10/26/22 1136  NA 134*  K 3.1*  CL 101  CO2 24  GLUCOSE 81  BUN 6*  CREATININE 0.88  CALCIUM 8.7*    CBC: Recent Labs  Lab 10/26/22 1136  WBC 7.1  NEUTROABS 4.2  HGB 12.4*  HCT 36.3*  MCV 83.6  PLT 163    Coagulation Studies: No results for input(s): "LABPROT", "INR" in the last 72 hours.    I have  reviewed the images obtained:  Head CT reviewed, agree with radiology report 1. 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 2. Asymmetric increased density within a branch of the right middle cerebral artery is identified, concerning for thrombus. 3. No sign of acute intracranial hemorrhage. 4. Chronic microvascular disease and brain atrophy.  Impression: Embolic appearing stroke despite reported Xarelto adherence, unfortunately out of an intervention window.  Further workup pending as below  Recommendations:  # Right MCA embolic appearing stroke - Stroke labs HgbA1c, fasting lipid panel - MRI brain  - CTA head and neck - Frequent neuro checks - Echocardiogram - Prophylactic therapy-Antiplatelet med: Aspirin - dose 325mg  PO or 300mg  daily  until anticoagulation resumed - Hold off on anticoagulation for now due to acute stroke and ICH risk, restart date to be determined pending further imaging and clinical course - Risk factor modification - Telemetry monitoring; 30 day event monitor on discharge if no arrythmias captured  - Blood pressure goal   - Permissive hypertension to 220/120 overnight, pending clinical course would likely start to normalize blood pressure goal tomorrow - PT consult, OT consult, Speech consult, unless patient is back to baseline - Neurology will follow along  # Left upper extremity edema -May be exacerbated by weakness but defer to primary team if ultrasound is felt to be indicated to rule out DVT  # Goal of care - Wife inquiring about prognosis, discussed given his age and comorbidities as well as size of stroke he may have a complicated course.  Will need to see how he progresses over the next few days with ability to swallow and see further imaging etc. but early involvement of palliative care may be helpful   Brooke Dare MD-PhD [facility] [phone] [facility] coverage for [facility] is from 8 AM to 4 AM in-house and 4 PM to 8 PM by telephone/video. 8 PM to 8 AM emergent questions or overnight urgent questions should be addressed to Teleneurology On-call or [facility] neurohospitalist; contact information can be found on AMION

## 2022-10-26 NOTE — Assessment & Plan Note (Addendum)
Patient appears euvolemic at this time.  Will hold off on any diuretic.  Will monitor closely while receiving gentle fluids

## 2022-10-26 NOTE — Assessment & Plan Note (Addendum)
Patient remains in atrial fibrillation, however with slowed ventricular response  - Hold home Xarelto in the setting of active CVA pending neurology recommendations - Hold home rate controlling agents for permissive hypertension and bradycardia - Hold home digoxin until SLP evaluation

## 2022-10-27 ENCOUNTER — Inpatient Hospital Stay: Payer: Medicare HMO

## 2022-10-27 DIAGNOSIS — G959 Disease of spinal cord, unspecified: Secondary | ICD-10-CM

## 2022-10-27 DIAGNOSIS — M4712 Other spondylosis with myelopathy, cervical region: Secondary | ICD-10-CM

## 2022-10-27 DIAGNOSIS — M4802 Spinal stenosis, cervical region: Secondary | ICD-10-CM | POA: Insufficient documentation

## 2022-10-27 DIAGNOSIS — I639 Cerebral infarction, unspecified: Secondary | ICD-10-CM

## 2022-10-27 LAB — BLOOD GAS, VENOUS
Acid-Base Excess: 6.4 mmol/L — ABNORMAL HIGH (ref 0.0–2.0)
Bicarbonate: 29.4 mmol/L — ABNORMAL HIGH (ref 20.0–28.0)
O2 Saturation: 100 %
Patient temperature: 37
pCO2, Ven: 36 mmHg — ABNORMAL LOW (ref 44–60)
pH, Ven: 7.52 — ABNORMAL HIGH (ref 7.25–7.43)
pO2, Ven: 164 mmHg — ABNORMAL HIGH (ref 32–45)

## 2022-10-27 LAB — BASIC METABOLIC PANEL
Anion gap: 9 (ref 5–15)
BUN: 7 mg/dL — ABNORMAL LOW (ref 8–23)
CO2: 25 mmol/L (ref 22–32)
Calcium: 8.7 mg/dL — ABNORMAL LOW (ref 8.9–10.3)
Chloride: 102 mmol/L (ref 98–111)
Creatinine, Ser: 0.87 mg/dL (ref 0.61–1.24)
GFR, Estimated: >60 mL/min (ref >60)
Glucose, Bld: 66 mg/dL — ABNORMAL LOW (ref 70–99)
Potassium: 3.4 mmol/L — ABNORMAL LOW (ref 3.5–5.1)
Sodium: 136 mmol/L (ref 135–145)

## 2022-10-27 LAB — CBC WITH DIFFERENTIAL/PLATELET
Abs Immature Granulocytes: 0.01 10*3/uL (ref 0.00–0.07)
Basophils Absolute: 0 10*3/uL (ref 0.0–0.1)
Basophils Relative: 0 %
Eosinophils Absolute: 0.2 10*3/uL (ref 0.0–0.5)
Eosinophils Relative: 3 %
HCT: 32.4 % — ABNORMAL LOW (ref 39.0–52.0)
Hemoglobin: 11.2 g/dL — ABNORMAL LOW (ref 13.0–17.0)
Immature Granulocytes: 0 %
Lymphocytes Relative: 35 %
Lymphs Abs: 2.1 10*3/uL (ref 0.7–4.0)
MCH: 28.6 pg (ref 26.0–34.0)
MCHC: 34.6 g/dL (ref 30.0–36.0)
MCV: 82.7 fL (ref 80.0–100.0)
Monocytes Absolute: 0.7 10*3/uL (ref 0.1–1.0)
Monocytes Relative: 12 %
Neutro Abs: 2.9 10*3/uL (ref 1.7–7.7)
Neutrophils Relative %: 50 %
Platelets: 159 10*3/uL (ref 150–400)
RBC: 3.92 MIL/uL — ABNORMAL LOW (ref 4.22–5.81)
RDW: 14.8 % (ref 11.5–15.5)
WBC: 5.8 10*3/uL (ref 4.0–10.5)
nRBC: 0 % (ref 0.0–0.2)

## 2022-10-27 LAB — ECHOCARDIOGRAM COMPLETE
AR max vel: 1.68 cm2
AV Peak grad: 11.6 mmHg
Ao pk vel: 1.7 m/s
Area-P 1/2: 2.85 cm2
S' Lateral: 2.2 cm
Weight: 3142.88 oz

## 2022-10-27 LAB — PHOSPHORUS: Phosphorus: 2.8 mg/dL (ref 2.5–4.6)

## 2022-10-27 LAB — MAGNESIUM: Magnesium: 1.7 mg/dL (ref 1.7–2.4)

## 2022-10-27 MED ORDER — POTASSIUM CHLORIDE 20 MEQ PO PACK
40.0000 meq | PACK | Freq: Once | ORAL | Status: AC
  Administered 2022-10-27: 40 meq via ORAL
  Filled 2022-10-27: qty 2

## 2022-10-27 NOTE — TOC Initial Note (Signed)
Transition of Care [facility]) - Initial/Assessment Note    Patient Details  Name: [name] [name] MRN: [id number] Date of Birth: 1940/06/22  Transition of [facility]) CM/SW Contact:    Allena Katz, LCSW Phone Number: 10/27/2022, 2:49 PM  Clinical Narrative:                 CSW attempted to discuss SNF with patients wife but unable to leave a message. Pt disoriented CSW will try again later in admission.         Patient Goals and CMS Choice            Expected Discharge Plan and Services                                              Prior Living Arrangements/Services                       Activities of Daily Living Home Assistive Devices/Equipment: Bedside commode/3-in-1, Hospital bed, Wheelchair ADL Screening (condition at time of admission) Patient's cognitive ability adequate to safely complete daily activities?: Yes Is the patient deaf or have difficulty hearing?: No Does the patient have difficulty seeing, even when wearing glasses/contacts?: Yes Does the patient have difficulty concentrating, remembering, or making decisions?: No Patient able to express need for assistance with ADLs?: Yes Does the patient have difficulty dressing or bathing?: Yes Independently performs ADLs?: No Communication: Independent Dressing (OT): Needs assistance Grooming: Dependent Feeding: Independent Bathing: Needs assistance Toileting: Needs assistance In/Out Bed: Needs assistance Walks in Home: Dependent Does the patient have difficulty walking or climbing stairs?: Yes Weakness of Legs: None Weakness of Arms/Hands: Both  Permission Sought/Granted                  Emotional Assessment              Admission diagnosis:  Acute CVA (cerebrovascular accident) [facility]) [I63.9] Patient Active Problem List   Diagnosis Date Noted   Acute CVA (cerebrovascular accident) (HCC) 10/26/2022   Sepsis due to undetermined organism (HCC) 08/10/2022   Cellulitis of  second toe of left foot 08/10/2022   Generalized weakness 08/10/2022   Sepsis (HCC) 08/10/2022   Gangrene (HCC) 08/10/2022   AKI (acute kidney injury) (HCC) 08/10/2022   Hx of AKA (above knee amputation), 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   Acute blood loss anemia    Anemia of chronic disease    Leukocytosis    Chronic diastolic CHF (congestive heart failure) (HCC)    Moderate malnutrition (HCC) 03/11/2021   Acute osteomyelitis of right ankle (HCC) 03/08/2021   Open wound of right ankle 03/08/2021   Lactic acidosis 03/08/2021   Hypoalbuminemia due to protein-calorie malnutrition (HCC) 03/08/2021   Hyperglycemia due to diabetes mellitus (HCC) 03/08/2021   Hyponatremia 03/08/2021   Mixed hyperlipidemia 03/08/2021   Peripheral arterial disease (HCC) 03/08/2021   CHF (congestive heart failure) (HCC) 02/02/2021   Type 2 diabetes mellitus with peripheral neuropathy (HCC) 02/02/2021   Right leg weakness 11/19/2019   Vitamin B12 deficiency 05/20/2018   Vitamin D deficiency 05/20/2018   Cardiomyopathy (HCC) 07/29/2013   Asthma 02/19/2012   Essential hypertension 02/19/2012   Atrial fibrillation (HCC) 10/27/2011   GERD (gastroesophageal reflux disease) 10/27/2011   Dyslipidemia 10/27/2011   PCP:  Luciana Axe, NP Pharmacy:   [facility] - [place], [place] - [address] [place] [address] Phone: [phone] Fax: [phone]     Social Determinants of Health (SDOH) Social History: SDOH Screenings   Food Insecurity: No Food Insecurity (10/26/2022)  Housing: Low Risk  (10/26/2022)  Transportation Needs: No Transportation Needs (10/26/2022)  Utilities: Not At Risk (10/26/2022)  Tobacco Use: High Risk (10/26/2022)   SDOH Interventions:     Readmission Risk Interventions     No data to display

## 2022-10-27 NOTE — Consult Note (Addendum)
Neurology Progress Note   Subjective: -No acute complaints -Reportedly did not tolerate c-collar overnight -Amenable to using c-collar during physical therapy evaluation today -Wife reports no new bowel or bladder symptoms, last bowel movement on Tuesday but this is a typical frequency for him - Continues to have significant edema of the left upper extremity particularly distally.  Wife reports that this was previously worked up including ultrasound to look for blood clot while he was in rehab after his last surgery, but it has increased since then  Exam: Current vital signs: BP (!) 166/86 (BP Location: Left Arm)   Pulse (!) 44   Temp 98 F (36.7 C) (Oral)   Resp 16   Ht 6' (1.829 m)   Wt 89.1 kg   SpO2 100%   BMI 26.64 kg/m  Vital signs in last 24 hours: Temp:  [97.9 F (36.6 C)-98.8 F (37.1 C)] 98 F (36.7 C) (07/05 1201) Pulse Rate:  [27-65] 44 (07/05 1201) Resp:  [12-16] 16 (07/05 1201) BP: (143-178)/(62-95) 166/86 (07/05 1201) SpO2:  [97 %-100 %] 100 % (07/05 1201)   Gen: In bed, comfortable  Resp: non-labored breathing, no grossly audible wheezing Cardiac: Perfusing extremities well  Abd: soft, nt MSK: No tenderness to palpation of the cervical spine.  Significantly limited range of motion of the C-spine bilaterally on passive movement Skin: Stable pitting edema of the left upper extremity distal more than proximal  Neuro: MS: Likely eyelid opening apraxia as well as some delayed responses.  Oriented to self, hospital but not [place], not oriented to time.  Able to follow some simple commands but often with delay CN: Continues to have strong right gaze preference, left hemianopia, minimal left facial droop Motor: Significant paratonia which limits evaluation for spasticity but does not clearly seem to have spasticity.  4+ to 5/5 in the right upper extremity, 4 - to 4+/5 in the left upper extremity, detailed testing somewhat limited by attention/concentration.  4+/5  right hip flexion 4/5 left flexion Sensory: Reduced response to touch in the left side DTR: Remains hyperreflexic in the brachioradialis, crossed adductors present  Pertinent Labs: Lab Results  Component Value Date   HGBA1C 5.1 10/26/2022    Lab Results  Component Value Date   CHOL 137 10/26/2022   HDL 38 (L) 10/26/2022   LDLCALC 87 10/26/2022   TRIG 59 10/26/2022   CHOLHDL 3.6 10/26/2022    CTA personally reviewed, agree with radiology:   1. 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 2. 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 The left vertebral artery is diminutive in caliber but patent throughout. 3. 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 The bilateral PCAs are patent, supplied by prominent posterior communicating arteries bilaterally. 4. Patent carotid systems without hemodynamically significant stenosis or occlusion. 5. 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 Consider MRI of the cervical spine for further evaluation. 6. Patulous and debris-filled esophagus. Correlate with any history of reflux.   MRI Brain personally reviewed, agree with radiology:   Extensive acute to early subacute infarct in the right MCA distribution as above without hemorrhagic transformation or mass effect.  ECHO  1. Left ventricular ejection fraction, by estimation, is 65 to 70%. The  left ventricle has normal function. The left  ventricle has no regional  wall motion abnormalities. There is moderate left ventricular hypertrophy.  Indeterminate diastolic filling due to E-A fusion.   2. Right ventricular systolic function is normal. The right ventricular  size is normal.   3. The mitral valve is  normal in structure. Mild mitral valve  regurgitation. No evidence of mitral stenosis.   4. Tricuspid valve regurgitation is mild to moderate.   5. The aortic valve is normal in structure. Aortic valve regurgitation is  not visualized. No aortic stenosis is present.   6. The inferior vena cava is normal in size with greater than 50%  respiratory variability, suggesting right atrial pressure of 3 mmHg.  Left Atrium: Left atrial size was normal in size. Right Atrium: Right atrial size was normal in size.     Impression: Right MCA territory acute ischemic stroke most likely atheroembolic given his significant intracranial atherosclerotic disease -- given adherence to Xarelto cardioembolic is less likely but still possible.  Today is day 3 from symptom discovery.  Given size of his stroke would plan to resume anticoagulation on 7/10 if he remains clinically stable and there are no other contraindications to starting anticoagulation at that time, and reduce aspirin dose back to 81 mg at that time.   Given his constipation and hyperreflexia, I do think it is reasonable to attempt MRI cervical spine again, though overall his strength exam is reassuring and stable  Recommendations: -Goal normotension to be achieved gradually over the next several days -A1c meeting goal -LDL is above goal of less than 70, agree with increase from home dose of 10 mg to ordered dose of 40 mg atorvastatin at this time -If he remains clinically stable please restart anticoagulation on 7/10 and reduce aspirin from 325 mg daily to 81 mg daily at that time -Reattempt MRI cervical spine -Neurology will follow-up MRI cervical spine but otherwise will sign off, please do not hesitate to reach out if questions or concerns arise -Recommendations conveyed to Dr. Myriam Forehand via secure chat  Addendum: MRI reviewed, neurosurgery consulted due to high cervical spine lesion with concern for cord signal change. Appreciate Dr. Apolinar Junes Smith's  evaluation   Brooke Dare MD-PhD [facility] [phone]  [facility] coverage for [facility] [place] is from 8 AM to 4 AM in-house and 4 PM to 8 PM by telephone/video. 8 PM to 8 AM emergent questions or overnight urgent questions should be addressed to Teleneurology On-call or [facility] neurohospitalist; contact information can be found on AMION

## 2022-10-27 NOTE — Progress Notes (Signed)
Patient returned to floor from MRI via bed in stable condition

## 2022-10-27 NOTE — Evaluation (Signed)
Physical Therapy Evaluation Patient Details Name: [name] [name] MRN: [id number] DOB: 15-Feb-1941 Today's Date: 10/27/2022  History of Present Illness  Pt is an 82 y.o. male presenting to hospital 10/26/22 with generalized fatigue and malaise past 24-48 hours.  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  Pt admitted with R MCA embolic appearing stroke, L UE edema, a-fib, and hypercarbia.  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  Clinical Impression  Prior to hospital admission, pt's wife reports pt was SBA with AP/PA/lateral scoot transfers to w/c and [facility]; has hospital bed; lives with his wife, son, and 2 grandchildren.  Pt inconsistent with answering questions and following 1 step cues (complicating assessment); pt's eyes often closed; L neglect noted; pt pushing/leaning to R at times.  Currently pt is max assist x2 with bed mobility and close SBA briefly with sitting balance (mostly mod assist for sitting balance d/t posterior lean).  Pt would currently benefit from skilled PT to address noted impairments and functional limitations (see below for any additional details).  Upon hospital discharge, pt would benefit from ongoing therapy.     Assistance Recommended at Discharge Frequent or constant Supervision/Assistance  If plan is discharge home, recommend the following:  Can travel by private vehicle  Two people to help with walking and/or transfers;Two people to help with bathing/dressing/bathroom;Assistance with cooking/housework;Assistance with feeding;Direct supervision/assist for medications management;Assist for transportation;Help with stairs or ramp for entrance   No    Equipment Recommendations Other (comment) (Hoyer lift)  Recommendations for Other Services       Functional Status Assessment  Patient has had a recent decline in their functional status and demonstrates the ability to make significant improvements in function in a reasonable and predictable amount of time.     Precautions / Restrictions Precautions Precautions: Fall;Cervical Precaution Comments: [place] cervical collar Required Braces or Orthoses: Cervical Brace Cervical Brace: Soft collar Restrictions Weight Bearing Restrictions: No      Mobility  Bed Mobility Overal bed mobility: Needs Assistance Bed Mobility: Rolling, Supine to Sit, Sit to Supine Rolling: Mod assist, Max assist   Supine to sit: Max assist, +2 for physical assistance, HOB elevated Sit to supine: Max assist, +2 for physical assistance, HOB elevated   General bed mobility comments: via logrolling; vc's for technique; assist for trunk and B LE's    Transfers                   General transfer comment: Pt did not follow cues in order to attempt any transfers    Ambulation/Gait                  Stairs            Wheelchair Mobility     Tilt Bed    Modified Rankin (Stroke Patients Only)       Balance Overall balance assessment: Needs assistance Sitting-balance support: Single extremity supported Sitting balance-Leahy Scale: Poor Sitting balance - Comments: occasionally close SBA but otherwise mod assist (d/t posterior lean) Postural control: Posterior lean                                   Pertinent Vitals/Pain Pain Assessment Pain Assessment: Faces Faces Pain Scale: No hurt Pain Intervention(s): Limited activity within patient's  tolerance, Monitored during session, Repositioned Vitals (HR and SpO2 on room air) stable and WFL throughout treatment session.    Home Living Family/patient expects to be discharged to:: Private residence Living Arrangements: Spouse/significant other;Children (Wife, son, and 2 grandkids) Available Help at Discharge: Family Type of Home: House Home  Access: Ramped entrance       Home Layout: One level Home Equipment: Agricultural consultant (2 wheels);BSC/3in1;Hospital bed;Wheelchair - power;Other (comment);Wheelchair - manual (Loaned power w/c; slideboard)   Prior Function Prior Level of Function : Needs assist       Physical Assist : Mobility (physical);ADLs (physical) Mobility (physical): Transfers ADLs (physical): Bathing;Toileting;IADLs;Dressing;Grooming Mobility Comments: Performs AP/PA and lateral scoot transfers baseline (SBA of wife) ADLs Comments: Pt's wife assists some with sponge bathing (pt does rest)     Hand Dominance   Dominant Hand: Right    Extremity/Trunk Assessment   Upper Extremity Assessment Upper Extremity Assessment: Generalized weakness;Difficult to assess due to impaired cognition    Lower Extremity Assessment Lower Extremity Assessment: Difficult to assess due to impaired cognition (B AKA)       Communication   Communication: HOH  Cognition Arousal/Alertness:  (Pt often with eyes closed but would wake up or talk with cues) Behavior During Therapy: Flat affect Overall Cognitive Status: Impaired/Different from baseline Area of Impairment: Attention, Following commands, Awareness, Problem solving, Orientation                   Current Attention Level: Selective   Following Commands: Follows one step commands inconsistently     Problem Solving: Slow processing, Requires verbal cues, Requires tactile cues General Comments: Pt appearing confused during session; pt inconsistent with responding or following cues during session        General Comments  Nursing cleared pt for participation in physical therapy.  Pt's wife and sister in law present during session.    Exercises     Assessment/Plan    PT Assessment Patient needs continued PT services  PT Problem List Decreased strength;Decreased activity tolerance;Decreased balance;Decreased mobility;Decreased cognition;Decreased knowledge  of use of DME;Decreased safety awareness;Decreased knowledge of precautions       PT Treatment Interventions DME instruction;Functional mobility training;Therapeutic activities;Therapeutic exercise;Balance training;Neuromuscular re-education;Patient/family education    PT Goals (Current goals can be found in the Care Plan section)  Acute Rehab PT Goals Patient Stated Goal: to improve mobility PT Goal Formulation: With patient/family Time For Goal Achievement: 11/10/22 Potential to Achieve Goals: Fair    Frequency Min 3X/week     Co-evaluation               AM-PAC PT "6 Clicks" Mobility  Outcome Measure Help needed turning from your back to your side while in a flat bed without using bedrails?: A Lot Help needed moving from lying on your back to sitting on the side of a flat bed without using bedrails?: Total Help needed moving to and from a bed to a chair (including a wheelchair)?: Total Help needed standing up from a chair using your arms (e.g., wheelchair or bedside chair)?: Total Help needed to walk in hospital room?: Total Help needed climbing 3-5 steps with a railing? : Total 6 Click Score: 7    End of Session   Activity Tolerance: Patient tolerated treatment well Patient left: in bed;with call bell/phone within reach;with bed alarm set;with family/visitor present Nurse Communication: Mobility status;Precautions PT Visit Diagnosis: Other abnormalities of gait and mobility (R26.89);Muscle weakness (generalized) (M62.81);Hemiplegia and hemiparesis Hemiplegia - Right/Left: Left Hemiplegia - caused  by: Cerebral infarction    Time: 1610-9604 PT Time Calculation (min) (ACUTE ONLY): 42 min   Charges:   PT Evaluation $PT Eval Low Complexity: 1 Low PT Treatments $Therapeutic Activity: 8-22 mins PT General Charges $$ ACUTE PT VISIT: 1 Visit        Hendricks Limes, PT 10/27/22, 1:49 PM

## 2022-10-27 NOTE — Consult Note (Signed)
Consulting Department:  Inpatient medicine  Primary Physician:  Luciana Axe, NP  Chief Complaint: Cervical stenosis with T2 signal change and myelopathy  History of Present Illness: 10/27/2022 [name] [name] is a 82 y.o. male who presents with the chief complaint of new stroke.  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  Neurosurgery was consulted.  82 year old man with a history of A-fib on Xarelto, hypertension hyperlipidemia diabetes PVD tobacco use who was found to have decreased level of responsiveness he is taken to the ED and found to have a MCA stroke.  As part of his workup he was found to have cervical stenosis with myelopathy.  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  Review of Systems:  A 10 point review of systems is negative, except for the pertinent positives and negatives detailed in the HPI.  Past Medical History: Past Medical History:  Diagnosis Date   CHF (congestive heart failure) (HCC)    Diabetes (HCC)    Dysrhythmia    AFIB   HTN (hypertension)    Infection    LEG   Pancreatitis     Past Surgical History: Past Surgical History:  Procedure Laterality Date   ABOVE KNEE LEG AMPUTATION Bilateral    AMPUTATION Right 03/09/2021   Procedure: AMPUTATION FOOT;  Surgeon: Annice Needy, MD;  Location: [facility];  Service: General;  Laterality: Right;   AMPUTATION Right 03/11/2021   Procedure: AMPUTATION ABOVE KNEE;  Surgeon: Annice Needy, MD;  Location: [facility];  Service: General;  Laterality: Right;   AMPUTATION Left 08/13/2022   Procedure: AMPUTATION ABOVE KNEE;  Surgeon: Bertram Denver, MD;  Location: [facility];  Service: Vascular;  Laterality: Left;   CATARACT EXTRACTION W/PHACO Right 03/08/2017   Procedure: CATARACT EXTRACTION PHACO AND INTRAOCULAR LENS PLACEMENT (IOC);  Surgeon: Nevada Crane, MD;  Location: [facility];  Service: Ophthalmology;  Laterality:  Right;  Lot# [id number] H [place]: 00:30.9 AP%: 8.2 CDE: 2.54   CATARACT EXTRACTION W/PHACO Left 04/04/2017   Procedure: CATARACT EXTRACTION PHACO AND INTRAOCULAR LENS PLACEMENT (IOC);  Surgeon: Nevada Crane, MD;  Location: [facility];  Service: Ophthalmology;  Laterality: Left;  [place] 00:31.4 AP% 12.0 CDE 3.78 Fluid Pack lot # [id number]   CHOLECYSTECTOMY     EYE SURGERY     right eye   LOWER EXTREMITY ANGIOGRAPHY Right 02/10/2021   Procedure: LOWER EXTREMITY ANGIOGRAPHY;  Surgeon: Annice Needy, MD;  Location: [facility];  Service: Cardiovascular;  Laterality: Right;   LOWER EXTREMITY ANGIOGRAPHY Left 07/03/2022   Procedure: Lower Extremity Angiography;  Surgeon: Annice Needy, MD;  Location: [facility];  Service: Cardiovascular;  Laterality: Left;   LOWER EXTREMITY ANGIOGRAPHY Left 07/24/2022   Procedure: Lower Extremity Angiography;  Surgeon: Annice Needy, MD;  Location: [facility];  Service: Cardiovascular;  Laterality: Left;   LOWER EXTREMITY ANGIOGRAPHY Left 08/11/2022   Procedure: Lower Extremity Angiography;  Surgeon: Annice Needy, MD;  Location: [facility];  Service: Cardiovascular;  Laterality: Left;    Allergies: Allergies as of 10/26/2022   (No Known Allergies)    Medications:  Current Facility-Administered Medications:    acetaminophen (TYLENOL) tablet 650 mg, 650 mg, Oral, Q4H PRN **OR** acetaminophen (TYLENOL) 160 MG/5ML solution 650 mg, 650 mg, Per Tube, Q4H PRN **OR** acetaminophen (TYLENOL) suppository 650 mg, 650 mg, Rectal, Q4H PRN, Verdene Lennert, MD  aspirin suppository 300 mg, 300 mg, Rectal, Daily **OR** aspirin tablet 325 mg, 325 mg, Oral, Daily, Bhagat, Srishti L, MD, 325 mg at 10/27/22 0751   atorvastatin (LIPITOR) tablet 40 mg, 40 mg, Oral, Daily, Verdene Lennert, MD, 40 mg at 10/27/22 0751   digoxin (LANOXIN) tablet 0.125 mg, 0.125 mg, Oral, Daily, Verdene Lennert, MD   enoxaparin (LOVENOX) injection 40 mg, 40 mg,  Subcutaneous, Q24H, Verdene Lennert, MD, 40 mg at 10/26/22 2038   ipratropium-albuterol (DUONEB) 0.5-2.5 (3) MG/3ML nebulizer solution 3 mL, 3 mL, Nebulization, Q6H PRN, Verdene Lennert, MD   senna-docusate (Senokot-S) tablet 1 tablet, 1 tablet, Oral, QHS PRN, Verdene Lennert, MD   Social History: Social History   Tobacco Use   Smoking status: Never   Smokeless tobacco: Current    Types: Chew  Substance Use Topics   Alcohol use: No    Alcohol/week: 0.0 standard drinks of alcohol   Drug use: No    Family Medical History: History reviewed. No pertinent family history.  Physical Examination: Vitals:   10/27/22 1201 10/27/22 1610  BP: (!) 166/86 (!) 156/82  Pulse: (!) 44 61  Resp: 16 18  Temp: 98 F (36.7 C) 98 F (36.7 C)  SpO2: 100% 100%     General: Patient does have some repetition language available, he is moving his bilateral upper extremities.  He has a bilateral AKA.  Patient is well developed, well nourished, calm, collected, and in no apparent distress.  NEUROLOGICAL:  General: In no acute distress.   Patient does have some repetitive language, he does state his name.  He follows very simple commands.  Motor: He is able to be seen with at least antigravity strength in the bilateral upper extremities, he does seem slightly more brisk on the right than the left. Sensory: Decreased responses to left-sided touches.   Hyperreflexia noted with spreading of the brachial radialis reflex as well as crossed adductors  Imaging: Narrative & Impression  CLINICAL DATA:  Myelopathy, acute, cervical spine.   EXAM: MRI CERVICAL SPINE WITHOUT CONTRAST   TECHNIQUE: Multiplanar, multisequence MR imaging of the cervical spine was performed. No intravenous contrast was administered.   COMPARISON:  None Available.   FINDINGS: The study is partially degraded by motion.   Alignment: 3 mm anterolisthesis of C3 over C4. Trace anterolisthesis at C5-6 and C6-7.   Vertebrae: No  fracture, evidence of discitis, or bone lesion.   Cord: Prominent mass effect on the cord at C3-4 with associated T2 hyperintensity.   Posterior Fossa, vertebral arteries, paraspinal tissues: Thickening of the retro odontoid soft tissues.   Disc levels:   C2-3: Posterior disc osteophyte complex and ligamentum flavum thickening resulting in mild spinal canal stenosis. Uncovertebral and facet degenerative changes resulting in high-grade bilateral neural foraminal narrowing.   C3-4: Anterolisthesis, posterior disc osteophyte complex and thickening of the ligamentum flavum resulting in severe spinal canal stenosis with severe mass effect on the cord. Uncovertebral and facet degenerative changes resulting in high-grade bilateral neural foraminal narrowing.   C4-5: Small posterior disc osteophyte complex without significant spinal canal stenosis. Uncovertebral and facet degenerative changes resulting in high-grade bilateral neural foraminal narrowing.   C5-6: Posterior disc osteophyte complex resulting in mild spinal canal stenosis. Uncovertebral and facet degenerative changes resulting in mild right and high-grade left neural foraminal narrowing.   C6-7: Small posterior disc osteophyte complex without significant spinal canal stenosis. Uncovertebral and facet degenerative changes resulting in mild right and high-grade left neural foraminal narrowing.   C7-T1: Posterior  disc osteophyte complex resulting in mild spinal canal stenosis. Uncovertebral and facet degenerative changes resulting high-grade bilateral neural foraminal narrowing.   IMPRESSION: 1. Advanced degenerative changes of the cervical spine, more pronounced at C3-4 where there is severe spinal canal stenosis with severe mass effect on the cord and associated cord signal abnormality. 2. Multilevel high-grade neural foraminal narrowing, as described above.     Electronically Signed   By: Baldemar Lenis M.D.   On: 10/27/2022 16:37       I have personally reviewed the images and agree with the above interpretation.  Labs:    Latest Ref Rng & Units 10/27/2022    4:36 AM 10/26/2022   11:36 AM 08/14/2022    5:42 AM  CBC  WBC 4.0 - 10.5 K/uL 5.8  7.1  14.4   Hemoglobin 13.0 - 17.0 g/dL 16.1  09.6  8.7   Hematocrit 39.0 - 52.0 % 32.4  36.3  25.1   Platelets 150 - 400 K/uL 159  163  192     Assessment and Plan: [name] [name] is a pleasant 82 y.o. male with recent admission for a MCA stroke, being managed by neurology and inpatient hospital medicine.  As part of his workup he was found to have some cervical stenosis and had a recent MRI with T2 signal change at C3-4 with stenosis secondary to spondylosis.  He does have a very complicated past medical history including but not limited to recent CVA, hypertension, hyperlipidemia, diabetes, bilateral above-the-knee amputations, resulting in being wheelchair-bound.  Malnutrition.  Unable to assess his lower extremity strength.  In the bilateral upper extremities he is at least antigravity, he does have some evidence of hyperreflexia noted both with crossed adductors and spreading of his brachial radialis.  He denies any neck pain.  There is likely cervical myelopathy here from the C3-4 stenosis.  Given his history of recent stroke and overall malnutrition level, and low level of function at baseline it is likely that an anterior cervical discectomy and fusion would likely be more risky than beneficial for him.  Especially in a patient of his age with this high level cervical 3 4, he would likely develop some swallowing difficulties even if he did not have the pre-existing neurologic insults.   Lovenia Kim, MD/MSCR Dept. of Neurosurgery

## 2022-10-27 NOTE — Progress Notes (Addendum)
Progress Note    [name] [name]  MWU:[id number] DOB: Jul 29, 1940  DOA: 10/26/2022 PCP: Luciana Axe, NP      Brief Narrative:    Medical records reviewed and are as summarized below:  [name] [name] is a 82 y.o. male  with medical history significant of HTN, CHF, T2DM, HLD, pancreatitis, atrial fibrillation on Xarelto, CKD Stage 2, who was brought to the hospital because of altered mental status.  According to his wife, patient had become more lethargic and weak.  It was noticed that patient was unable to grab a spoon.      Assessment/Plan:   Principal Problem:   Acute CVA (cerebrovascular accident) [facility]) Active Problems:   Atrial fibrillation (HCC)   Asthma   Type 2 diabetes mellitus with peripheral neuropathy (HCC)   Essential hypertension   Chronic diastolic CHF (congestive heart failure) (HCC)    Body mass index is 26.64 kg/m.   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  Follow-up with neurologist for further recommendations.  2D echo is pending. No hypercarbia on venous blood gas  Atrial fibrillation with slow ventricular response, bradycardia: Digoxin was held today.  Xarelto has also been held because of embolic stroke   Hypokalemia: Improving.  Continue potassium repletion.   Chronic diastolic CHF: Compensated.   Hypertension: Lisinopril, carvedilol and Lasix on hold.   Type II DM, mild hypoglycemia: Metformin on hold.  Monitor glucose levels closely.   History of asthma  Consult palliative care team   Diet Order             Diet regular Room service appropriate? Yes with Assist; Fluid consistency: Thin  Diet effective now                            Consultants: Neurologist  Procedures: None    Medications:    aspirin  300 mg Rectal Daily   Or   aspirin  325 mg Oral Daily   atorvastatin  40 mg Oral Daily   digoxin  0.125 mg Oral Daily   enoxaparin  (LOVENOX) injection  40 mg Subcutaneous Q24H   Continuous Infusions:   Anti-infectives (From admission, onward)    None              Family Communication/Anticipated D/C date and plan/Code Status   DVT prophylaxis: enoxaparin (LOVENOX) injection 40 mg Start: 10/26/22 2000     Code Status: Full Code  Family Communication: None Disposition Plan: Plan to discharge to SNF   Status is: Inpatient Remains inpatient appropriate because: acute stroke       Subjective:   Interval events noted. He is confused and cannot provide any history  Objective:    Vitals:   10/27/22 0740 10/27/22 0744 10/27/22 0829 10/27/22 1201  BP:   (!) 144/73 (!) 166/86  Pulse: (!) 45 (!) 53 (!) 54 (!) 44  Resp:   16 16  Temp:   98.4 F (36.9 C) 98 F (36.7 C)  TempSrc:   Oral Oral  SpO2:   100% 100%  Weight:      Height:       No data found.   Intake/Output Summary (Last 24 hours) at 10/27/2022 1350 Last data filed at 10/27/2022 0400 Gross per 24 hour  Intake --  Output 450 ml  Net -450 ml   Filed Weights   10/26/22 1119  Weight: 89.1 kg  Exam:  GEN: NAD SKIN: Warm and dry EYES: No pallor or icterus ENT: MMM CV: Irregular rate and rhythm PULM: CTA B ABD: soft, ND, NT, +BS CNS: AAO x 1 ( person), exam is limited because he does not follow commands. EXT: No edema or tenderness. B/l AKA        Data Reviewed:   I have personally reviewed following labs and imaging studies:  Labs: Labs show the following:   Basic Metabolic Panel: Recent Labs  Lab 10/26/22 1136 10/27/22 0436  NA 134* 136  K 3.1* 3.4*  CL 101 102  CO2 24 25  GLUCOSE 81 66*  BUN 6* 7*  CREATININE 0.88 0.87  CALCIUM 8.7* 8.7*   GFR Estimated Creatinine Clearance: 74.3 mL/min (by C-G formula based on SCr of 0.87 mg/dL). Liver Function Tests: Recent Labs  Lab 10/26/22 1136  AST 14*  ALT 8  ALKPHOS 67  BILITOT 1.3*  PROT 6.9  ALBUMIN 3.2*   No results for input(s):  "LIPASE", "AMYLASE" in the last 168 hours. No results for input(s): "AMMONIA" in the last 168 hours. Coagulation profile No results for input(s): "INR", "PROTIME" in the last 168 hours.  CBC: Recent Labs  Lab 10/26/22 1136 10/27/22 0436  WBC 7.1 5.8  NEUTROABS 4.2 2.9  HGB 12.4* 11.2*  HCT 36.3* 32.4*  MCV 83.6 82.7  PLT 163 159   Cardiac Enzymes: No results for input(s): "CKTOTAL", "CKMB", "CKMBINDEX", "TROPONINI" in the last 168 hours. BNP (last 3 results) No results for input(s): "PROBNP" in the last 8760 hours. CBG: No results for input(s): "GLUCAP" in the last 168 hours. D-Dimer: No results for input(s): "DDIMER" in the last 72 hours. Hgb A1c: Recent Labs    10/26/22 1136  HGBA1C 5.1   Lipid Profile: Recent Labs    10/26/22 1308  CHOL 137  HDL 38*  LDLCALC 87  TRIG 59  CHOLHDL 3.6   Thyroid function studies: No results for input(s): "TSH", "T4TOTAL", "T3FREE", "THYROIDAB" in the last 72 hours.  Invalid input(s): "FREET3" Anemia work up: No results for input(s): "VITAMINB12", "FOLATE", "FERRITIN", "TIBC", "IRON", "RETICCTPCT" in the last 72 hours. Sepsis Labs: Recent Labs  Lab 10/26/22 1130 10/26/22 1136 10/26/22 1309 10/27/22 0436  WBC  --  7.1  --  5.8  LATICACIDVEN 1.3  --  1.4  --     Microbiology No results found for this or any previous visit (from the past 240 hour(s)).  Procedures and diagnostic studies:  MR BRAIN WO CONTRAST  Result Date: 10/26/2022 CLINICAL DATA:  Altered mental status, stroke follow-up. EXAM: MRI HEAD WITHOUT CONTRAST TECHNIQUE: Multiplanar, multiecho pulse sequences of the brain and surrounding structures were obtained without intravenous contrast. COMPARISON:  Same-day CT/CTA head and neck. FINDINGS: Brain: There is extensive diffusion restriction in the right MCA distribution involving the caudate and lentiform nucleus, dorsal lateral thalamus, posterior aspect of the insula, and temporal and parietal cortex with  associated FLAIR signal abnormality consistent with acute to early subacute infarct. The frontal cortex is spared. There is mild gyral swelling and sulcal effacement but no midline shift. There is no evidence of hemorrhagic transformation. Background parenchymal volume is within expected limits for age. The ventricles are normal in size. There are small remote infarcts in the bilateral cerebellar hemispheres, thalami, and right basal ganglia superimposed on mild background chronic small-vessel ischemic change The pituitary and suprasellar region are normal. There is no mass lesion. There is no mass effect or midline shift. Vascular: SWI signal dropout in  the right sylvian fissure likely reflects clot in an M2 vessel. Skull and upper cervical spine: Normal marrow signal. Sinuses/Orbits: The paranasal sinuses are clear. Bilateral lens implants are in place. The globes and orbits are otherwise unremarkable. Other: The mastoid air cells and middle ear cavities are clear. IMPRESSION: Extensive acute to early subacute infarct in the right MCA distribution as above without hemorrhagic transformation or mass effect. Electronically Signed   By: Lesia Hausen M.D.   On: 10/26/2022 18:17   CT ANGIO HEAD NECK W WO CM  Result Date: 10/26/2022 CLINICAL DATA:  Fatigue, altered mental status. EXAM: CT ANGIOGRAPHY HEAD AND NECK WITH AND WITHOUT CONTRAST TECHNIQUE: Multidetector CT imaging of the head and neck was performed using the standard protocol during bolus administration of intravenous contrast. Multiplanar CT image reconstructions and MIPs were obtained to evaluate the vascular anatomy. Carotid stenosis measurements (when applicable) are obtained utilizing NASCET criteria, using the distal internal carotid diameter as the denominator. RADIATION DOSE REDUCTION: This exam was performed according to the departmental dose-optimization program which includes automated exposure control, adjustment of the mA and/or kV according  to patient size and/or use of iterative reconstruction technique. CONTRAST:  75mL OMNIPAQUE IOHEXOL 350 MG/ML SOLN COMPARISON:  Same-day CT head FINDINGS: CTA NECK FINDINGS Aortic arch: There is mild calcified plaque in the imaged aortic arch. The origins of the major branch vessels are patent. The subclavian arteries are patent to the level imaged. Right carotid system: The right common, internal, and external carotid arteries are patent, without hemodynamically significant stenosis or occlusion there is no evidence of dissection or aneurysm. Left carotid system: The left common, internal, and external carotid arteries are patent, without hemodynamically significant stenosis or occlusion. There is no evidence of dissection or aneurysm. Vertebral arteries: The right vertebral artery is occluded throughout its course with a minimal reconstitution of flow in the V3 segment. The left vertebral artery is diminutive in caliber but patent throughout. There is moderate stenosis at its origin. Skeleton: There is no acute osseous abnormality or suspicious osseous lesion. There is no visible canal hematoma. There is probable severe spinal canal stenosis at C3-C4 with suspected cord compression. Other neck: Soft tissues of the neck are unremarkable. Upper chest: The imaged lung apices are clear. The esophagus is patulous and filled with debris. Review of the MIP images confirms the above findings CTA HEAD FINDINGS Anterior circulation: There is mild calcified plaque in the intracranial ICAs without significant stenosis or occlusion. The right M1 segment is patent. An inferior right M2 branch is occluded in the sylvian fissure (8-96), and a superior right M2/proximal M3 branch is occluded more distally (8-102). There is overall minimal collateral flow in the distal right MCA distribution (11-24). The left M1 segment and distal branches are patent, without proximal high-grade stenosis or occlusion. The bilateral ACAS are patent,  without proximal stenosis or occlusion. The anterior communicating artery is normal. There is no aneurysm or AVM. Posterior circulation: The right V4 segment is occluded. The left V4 segment is markedly diminutive but patent. The basilar artery is diminutive and severely stenotic proximally. PICA is identified bilaterally. The superior cerebellar arteries are identified bilaterally The bilateral PCAs are patent, supplied by prominent posterior communicating arteries bilaterally. There is no proximal stenosis or occlusion. There is no aneurysm or AVM. Venous sinuses: Not well assessed due to bolus timing. Anatomic variants: As above. Review of the MIP images confirms the above findings IMPRESSION: 1. Occlusion of the inferior right M2 branch in the  Sylvian fissure and a superior right M2/proximal M3 branch more distaslly, with minimal collateral flow in the distal right MCA distribution. 2. 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 The left vertebral artery is diminutive in caliber but patent throughout. 3. 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 The bilateral PCAs are patent, supplied by prominent posterior communicating arteries bilaterally. 4. Patent carotid systems without hemodynamically significant stenosis or occlusion. 5. 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 Consider MRI of the cervical spine for further evaluation. 6. Patulous and debris-filled esophagus. Correlate with any history of reflux. Electronically Signed   By: Lesia Hausen M.D.   On: 10/26/2022 15:13   CT HEAD WO CONTRAST ( )  Result Date: 10/26/2022 CLINICAL DATA:  Mental status change. EXAM: CT HEAD WITHOUT CONTRAST TECHNIQUE: Contiguous axial images were obtained from the base of the skull through the vertex without intravenous contrast. RADIATION DOSE REDUCTION: This exam was performed  according to the departmental dose-optimization program which includes automated exposure control, adjustment of the mA and/or kV according to patient size and/or use of iterative reconstruction technique. COMPARISON:  08/10/2022 FINDINGS: Brain: Signs subacute infarct is identified involving the right parietal lobe with cortical and subcortical low attenuation edema with effacement of the sulci, image 18/2. Subacute infarct with low-attenuation edema involving the right basal ganglia is also identified and appears new from the previous exam, image 16/2. No sign of acute intracranial hemorrhage. There is moderate diffuse low-attenuation within the subcortical and periventricular white matter compatible with chronic microvascular disease. Prominence of the sulci and ventricles identified compatible with brain atrophy. Vascular: Asymmetric increased density within a branch of the right middle cerebral artery is noted, image 15/2. Skull: Normal. Negative for fracture or focal lesion. Sinuses/Orbits: No acute finding. Other: None. IMPRESSION: 1. 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 2. Asymmetric increased density within a branch of the right middle cerebral artery is identified, concerning for thrombus. 3. No sign of acute intracranial hemorrhage. 4. Chronic microvascular disease and brain atrophy. Critical Value/emergent results were called by telephone at the time of interpretation on 10/26/2022 at 1:09 pm to provider Willy Eddy , who verbally acknowledged these results. Electronically Signed   By: Signa Kell M.D.   On: 10/26/2022 13:09   DG Chest Port 1 View  Result Date: 10/26/2022 CLINICAL DATA:  Altered mental status EXAM: PORTABLE CHEST 1 VIEW COMPARISON:  Chest x-ray dated August 09, 2022 FINDINGS: Cardiac and mediastinal contours are within normal limits for exam technique. Lungs are clear. No evidence of pleural effusion or pneumothorax. Patient head  position somewhat limits evaluation of the upper lungs. IMPRESSION: No active disease. Electronically Signed   By: Allegra Lai M.D.   On: 10/26/2022 12:37               LOS: 1 day   Korde Jeppsen  [facility]   Pager on [email or web address]. If 7PM-7AM, please contact night-coverage at [email or web address]     10/27/2022, 1:50 PM

## 2022-10-27 NOTE — Evaluation (Addendum)
Speech Language Pathology Evaluation Patient Details Name: [name] [name] MRN: [id number] DOB: 18-Mar-1941 Today's Date: 10/27/2022 Time: 0950-1030 SLP Time Calculation (min) (ACUTE ONLY): 40 min  Problem List:  Patient Active Problem List   Diagnosis Date Noted   Acute CVA (cerebrovascular accident) (HCC) 10/26/2022   Sepsis due to undetermined organism (HCC) 08/10/2022   Cellulitis of second toe of left foot 08/10/2022   Generalized weakness 08/10/2022   Sepsis (HCC) 08/10/2022   Gangrene (HCC) 08/10/2022   AKI (acute kidney injury) (HCC) 08/10/2022   Hx of AKA (above knee amputation), 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   Acute blood loss anemia    Anemia of chronic disease    Leukocytosis    Chronic diastolic CHF (congestive heart failure) (HCC)    Moderate malnutrition (HCC) 03/11/2021   Acute osteomyelitis of right ankle (HCC) 03/08/2021   Open wound of right ankle 03/08/2021   Lactic acidosis 03/08/2021   Hypoalbuminemia due to protein-calorie malnutrition (HCC) 03/08/2021   Hyperglycemia due to diabetes mellitus (HCC) 03/08/2021   Hyponatremia 03/08/2021   Mixed hyperlipidemia 03/08/2021   Peripheral arterial disease (HCC) 03/08/2021   CHF (congestive heart failure) (HCC) 02/02/2021   Type 2 diabetes mellitus with peripheral neuropathy (HCC) 02/02/2021   Right leg weakness 11/19/2019   Vitamin B12 deficiency 05/20/2018   Vitamin D deficiency 05/20/2018   Cardiomyopathy (HCC) 07/29/2013   Asthma 02/19/2012   Essential hypertension 02/19/2012   Atrial fibrillation (HCC) 10/27/2011   GERD (gastroesophageal reflux disease) 10/27/2011   Dyslipidemia 10/27/2011   Past Medical History:  Past Medical History:  Diagnosis Date   CHF (congestive heart failure) (HCC)    Diabetes (HCC)    Dysrhythmia    AFIB   HTN (hypertension)    Infection    LEG   Pancreatitis    Past Surgical History:  Past Surgical History:  Procedure Laterality Date   ABOVE KNEE LEG AMPUTATION Bilateral     AMPUTATION Right 03/09/2021   Procedure: AMPUTATION FOOT;  Surgeon: Annice Needy, MD;  Location: [facility];  Service: General;  Laterality: Right;   AMPUTATION Right 03/11/2021   Procedure: AMPUTATION ABOVE KNEE;  Surgeon: Annice Needy, MD;  Location: [facility];  Service: General;  Laterality: Right;   AMPUTATION Left 08/13/2022   Procedure: AMPUTATION ABOVE KNEE;  Surgeon: Bertram Denver, MD;  Location: [facility];  Service: Vascular;  Laterality: Left;   CATARACT EXTRACTION W/PHACO Right 03/08/2017   Procedure: CATARACT EXTRACTION PHACO AND INTRAOCULAR LENS PLACEMENT (IOC);  Surgeon: Nevada Crane, MD;  Location: [facility];  Service: Ophthalmology;  Laterality: Right;  Lot# [id number] [place]: 00:30.9 AP%: 8.2 CDE: 2.54   CATARACT EXTRACTION W/PHACO Left 04/04/2017   Procedure: CATARACT EXTRACTION PHACO AND INTRAOCULAR LENS PLACEMENT (IOC);  Surgeon: Nevada Crane, MD;  Location: [facility];  Service: Ophthalmology;  Laterality: Left;  [place] 00:31.4 AP% 12.0 CDE 3.78 Fluid Pack lot # [id number]   CHOLECYSTECTOMY     EYE SURGERY     right eye   LOWER EXTREMITY ANGIOGRAPHY Right 02/10/2021   Procedure: LOWER EXTREMITY ANGIOGRAPHY;  Surgeon: Annice Needy, MD;  Location: [facility];  Service: Cardiovascular;  Laterality: Right;   LOWER EXTREMITY ANGIOGRAPHY Left 07/03/2022   Procedure: Lower Extremity Angiography;  Surgeon: Annice Needy, MD;  Location: [facility];  Service: Cardiovascular;  Laterality: Left;   LOWER EXTREMITY ANGIOGRAPHY Left 07/24/2022   Procedure: Lower Extremity Angiography;  Surgeon: Annice Needy, MD;  Location: [facility];  Service: Cardiovascular;  Laterality: Left;   LOWER EXTREMITY ANGIOGRAPHY Left 08/11/2022   Procedure: Lower Extremity Angiography;  Surgeon: Annice Needy, MD;  Location: [facility];  Service: Cardiovascular;  Laterality: Left;   HPI:  Pt is an 82 y.o. male presenting to hospital 10/26/22 with generalized fatigue and  malaise past 24-48 hours. 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  Pt admitted with MRI revealing acute-subacute R MCA embolic appearing stroke, L UE edema, a-fib, and hypercarbia.   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  Per chart, pt recently hospitalized at [facility] on 4/17 on Sepsis- Had a L AKA on 4/21 was discharged from the hospital on 4/23. He was then discharged to [facility] (previously [facility] Discharged back home on 5/14. Since hospital D/C has been feeling good. Said they did 3 procedures- 2 before admission to the hospital, 1- after admission to try to open up the arteries and save the leg. Didn't work. Got sepsis. Pt received PT while in Rehab.  CXR: No active disease.   OF NOTE: pt is inconsistent in his engagement ("sometimes afternoons are better", per NSG who has had him as a pt prior).   Assessment / Plan / Recommendation Clinical Impression   Pt seen today for informal cognitive-communication and language screening at bedside. Pt was finishing his Breakfast meal w/ Family present in room. Pt awake/alert and w/ Wife supporting feeding him although he could fully use Bilateral UEs to hold Cup for drinking when asked to/supported w/ setup. She stated he "can feed himself at home but makes a mess doing it". Per chart notes, she supports many of his ADLs in the home.  Noted MRI results indicating "an acute-subacute R MCA embolic appearing stroke, and Chronic microvascular disease and brain Atrophy and Subacute infarct involving R parietel lobe and R basal ganglia. Unsure of pt's Full cognitive-communication functioning w/in the home prior to this admit.  Pt verbally  conversed w/ Family/NSG and this SLP in general, basic phrases functionally to make wants/needs known. Min Distracted intermittently. On RA; afebrile; WBC wnl.    Pt presents with functional communication abilities to make wants/needs known to those in his immediate environment. However, impairments noted in overall communication noted and characterized by deficits in the areas of lengthy attention, memory recall, and mental flexibility. Deficits could impact ability to independently manage finances, medications, and maintain safety in the home environment. Unsure if History of cognitive deficits as pt's Wife endorsed she is providing much support in the Home w/ pt's ADLs. Pt has had recent AKA(2nd) surgery w/ hospitalization/Rehab in recent months per chart.    Pt was able to identify Basic objects/function, answer Simple Y/N questions, and follow 1-Step commands. Pt was able to complete general Basic language tasks in setting of Basic ADLs(self-feeding). No gross expressive language deficits were noted though pt was not overly verbal nor engaged w/ others in lengthy interactions/conversation -- Wife stated this was Baseline for pt. Primary Cognitive-communication deficits were associated with insight/awareness for complex situations, memory retrieval tasks and tasks of higher level executive functioning, mental flexibility. Responses lacked details. Pt was able to give some information re: self, some orientation information and problem-solving scenarios which improved with cueing. Pt was able to  Identify Basic problem-solving re: self/needs for a functional task: "I need pull rope and my pee bottle so I don't get the bed wet" -- he uses this and a pull rope to help sit himself up in the bed per his Wife.   Suspect any Baseline Cognitive decline (see Imaging identifying "Chronic microvascular disease and brain atrophy" could impact Cognitive-communication skills especially at the higher, more complex levels.    Unsure if pt is far from his previous communication Baseline in setting of premorbid status. Recommend f/u w/ Neurology for formal assessment of Cognition. Recommend f/u at his next venue of care for Cognitive-communication intervention could be beneficial w/ Cognitive ADLs in his d/c environment, if any decline from his Baseline functioning is noted by others around him.  No further Acute ST services indicated currently but f/u with ST services in the Outpatient setting as determined needed was discussed w/ pt/Wife and Team and could be had. Pt/Wife in agreement w/ plan. Team updated.  Of Note: pt fed and was supported w/ feeding at Breakfast meal during this session w/ no overt s/s of aspiration noted. Pt is currently on a  Regular diet consistency w/ thins per chart.      SLP Assessment  SLP Recommendation/Assessment: All further Speech Lanaguage Pathology  needs can be addressed in the next venue of care SLP Visit Diagnosis: Cognitive communication deficit (R41.841)    Recommendations for follow up therapy are one component of a multi-disciplinary discharge planning process, led by the attending physician.  Recommendations may be updated based on patient status, additional functional criteria and insurance authorization.    Follow Up Recommendations  Follow physician's recommendations for discharge plan and follow up therapies; f/u w/ Neurology for formal assessment of Cognition   Assistance Recommended at Discharge  Frequent or constant Supervision/Assistance (Baseline)  Functional Status Assessment  (TBD)  Frequency and Duration  (n/a)   (n/a)      SLP Evaluation Cognition  Overall Cognitive Status: Impaired/Different from baseline (reported H/O some impairment per report) Arousal/Alertness: Awake/alert Orientation Level: Oriented to person;Oriented to place;Disoriented to time;Disoriented to situation Year: 2024 Attention: Focused;Sustained Focused Attention: Impaired Focused  Attention Impairment: Verbal complex;Functional complex Sustained Attention: Impaired Sustained Attention Impairment: Verbal complex;Functional complex Memory: Impaired Memory Impairment: Retrieval deficit (of events occurring this morning) Awareness: Impaired (for complex tasks but w/ functional tasks WFL ("I need my pee bottle so I don't get the bed wet")) Problem Solving: Impaired Problem Solving Impairment: Verbal complex;Functional complex Behaviors:  (limited mental flexibility) Safety/Judgment: Impaired (functional for basic situations described)       Comprehension  Auditory Comprehension Overall Auditory Comprehension: Appears within functional limits for tasks assessed Yes/No Questions: Within Functional Limits (Basic ?) Commands: Within Functional Limits (1-Step) Conversation: Simple Visual Recognition/Discrimination Discrimination: Not tested Reading Comprehension Reading Status: Not tested    Expression Expression Primary Mode of Expression: Verbal Verbal Expression Overall Verbal Expression: Appears within functional limits for tasks assessed Initiation: No impairment Automatic Speech: Name;Social Response Level of Generative/Spontaneous Verbalization: Phrase Repetition: No impairment (Basic words) Naming: No impairment (Basic items used at meal) Pragmatics: Impairment (? Flat affect) Impairments: Abnormal affect;Eye contact Interfering Components: Attention Effective Techniques:  (Repetition) Non-Verbal Means of Communication: Not applicable Written Expression Dominant Hand: Right Written Expression: Not tested   Oral / Motor  Oral Motor/Sensory Function Overall Oral Motor/Sensory Function: Within functional limits Motor Speech Overall Motor Speech: Appears within functional limits for tasks assessed (grossly - mumbled speech at times) Respiration: Within functional limits Phonation: Normal  Resonance: Within functional limits Articulation: Within  functional limitis (intelligible) Intelligibility: Intelligible              Jerilynn Som, MS, CCC-SLP Speech Language Pathologist Rehab Services; [facility] [place] [phone] (ascom) Leilah Polimeni 10/27/2022, 3:28 PM

## 2022-10-27 NOTE — Evaluation (Signed)
Occupational Therapy Evaluation Patient Details Name: [name] [name] MRN: [id number] DOB: July 30, 1940 Today's Date: 10/27/2022   History of Present Illness [name] [name] is a 82 y.o. 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 Prior to hospital admission, pt was receiving assistance for ADL completion. Pt lives with spouse who provides support to pt needs. Spouse reports stand-by assist for anterior/posterior transfers. Pt presents to acute OT demonstrating impaired ADL performance and functional mobility 2/2 altered mental status, generalized weakness, and decreased activity tolerance (See OT problem list for additional functional deficits). Pt currently requires Total A for transfers, MOD A for eating due to questionable left side inattention, disrupted motor planning, and dysmetria. MMT LUE: 2+ bicep and tricep, 3 grip. Possible hypertonia of LUE. Assessment/test requests complicated by difficulty following single step directions. Pt would benefit from skilled OT services to address noted impairments and functional limitations (see below for any additional details) in order to maximize safety and independence while minimizing falls risk and caregiver burden. Anticipate the need for follow up OT services upon acute hospital DC.       Recommendations for follow up therapy are one component of a multi-disciplinary discharge planning process, led by the attending physician.  Recommendations may be updated based on patient status, additional functional criteria and insurance authorization.   Assistance Recommended at Discharge Frequent or constant  Supervision/Assistance  Patient can return home with the following Two people to help with walking and/or transfers;A lot of help with bathing/dressing/bathroom;Assistance with cooking/housework;Assistance with feeding;Direct supervision/assist for medications management;Assist for transportation    Functional Status Assessment  Patient has had a recent decline in their functional status and demonstrates the ability to make significant improvements in function in a reasonable and predictable amount of time.  Equipment Recommendations  Other (comment) (defer to next venue of care)    Recommendations for Other Services Rehab consult     Precautions / Restrictions Precautions Precautions: Fall Required Braces or Orthoses: Cervical Brace Cervical Brace: Soft collar;Other (comment) (Suggested as a precaution until cord compression in ruled out.) Restrictions Weight Bearing Restrictions: No RLE Weight Bearing: Non weight bearing LLE Weight Bearing: Non weight bearing      Mobility Bed Mobility Overal bed mobility: Needs Assistance Bed Mobility: Supine to Sit, Sit to Supine     Supine to sit: HOB elevated, Total assist Sit to supine: Max assist   General bed mobility comments: MAX - total a to scoot in bed. Right side lean in long sitting position.    Transfers Overall transfer level: Needs assistance                 General transfer comment: Not assessed due to inability to follow commands and limited function of LUE.      Balance Overall balance assessment: Needs assistance Sitting-balance support: Single extremity supported Sitting balance-Leahy Scale: Poor                                     ADL either performed or assessed with clinical judgement   ADL Overall ADL's : Needs assistance/impaired Eating/Feeding: Moderate assistance;With adaptive utensils;Cueing for safety;Cueing for sequencing;Cueing for  compensatory  techinques;Sitting Eating/Feeding Details (indicate cue type and reason): Pt repeatedly attempted to feed self with left hand althought fork and food were in his right hand. Pt did not acknowledge tray in front of him, until prompted to look. Difficult bring food/drink to mouth, misjudging distance. Pt able to chew and swallow without observed difficulty.                     Toilet Transfer: Total assistance;Anterior/posterior Toilet Transfer Details (indicate cue type and reason): Simulated - mobility in bed.           General ADL Comments: Pt demonstrated decreased strength in BUE, ?inattention to LUE.     Vision   Vision Assessment?: Vision impaired- to be further tested in functional context     Perception     Praxis      Pertinent Vitals/Pain Pain Assessment Pain Assessment: No/denies pain     Hand Dominance Right   Extremity/Trunk Assessment Upper Extremity Assessment Upper Extremity Assessment: Generalized weakness;Difficult to assess due to impaired cognition   Lower Extremity Assessment Lower Extremity Assessment: RLE deficits/detail;LLE deficits/detail (Bilateral AKA)       Communication Communication Communication: HOH   Cognition Arousal/Alertness: Awake/alert Behavior During Therapy: Flat affect Overall Cognitive Status: Impaired/Different from baseline Area of Impairment: Attention, Following commands, Awareness, Problem solving                   Current Attention Level: Selective   Following Commands: Follows one step commands inconsistently     Problem Solving: Slow processing, Requires verbal cues, Requires tactile cues General Comments: Difficult to determine accurate cognitive status 2/2 HOH and vision impairment.     General Comments       Exercises     Shoulder Instructions      Home Living Family/patient expects to be discharged to:: Private residence Living Arrangements: Spouse/significant other;Children (Wife,  son, and 2 grandkids) Available Help at Discharge: Family Type of Home: House Home Access: Ramped entrance     Home Layout: One level     Bathroom Shower/Tub: Sponge bathes at baseline         Home Equipment: Agricultural consultant (2 wheels);BSC/3in1;Hospital bed;Wheelchair - power;Other (comment);Wheelchair - manual (Loaned power w/c; slideboard)   Additional Comments: Pt unable to provide home set up, information obtain from prior admission in 07/2022      Prior Functioning/Environment Prior Level of Function : Needs assist       Physical Assist : Mobility (physical);ADLs (physical) Mobility (physical): Transfers ADLs (physical): Bathing;Toileting;IADLs;Dressing;Grooming Mobility Comments: Performs A/P and lateral scoot transfers baseline ADLs Comments: Pt's wife assists some with sponge bathing (pt does rest)        OT Problem List: Decreased strength;Decreased activity tolerance;Impaired balance (sitting and/or standing);Decreased cognition;Decreased safety awareness;Impaired tone;Impaired UE functional use      OT Treatment/Interventions: Self-care/ADL training;Therapeutic exercise;Energy conservation;Therapeutic activities;Patient/family education    OT Goals(Current goals can be found in the care plan section) Acute Rehab OT Goals Patient Stated Goal: To feel better OT Goal Formulation: With patient Time For Goal Achievement: 11/10/22 Potential to Achieve Goals: Good ADL Goals Pt Will Perform Eating: with set-up;sitting Pt Will Transfer to Toilet: with mod assist;bedside commode Pt Will Perform Toileting - Clothing Manipulation and hygiene: with mod assist;sitting/lateral leans  OT Frequency: Min 1X/week    Co-evaluation              AM-PAC OT "6 Clicks" Daily Activity     Outcome Measure Help  from another person eating meals?: A Lot Help from another person taking care of personal grooming?: A Lot Help from another person toileting, which includes using  toliet, bedpan, or urinal?: Total Help from another person bathing (including washing, rinsing, drying)?: A Lot Help from another person to put on and taking off regular upper body clothing?: A Lot Help from another person to put on and taking off regular lower body clothing?: A Lot 6 Click Score: 11   End of Session Nurse Communication: Other (comment) (Assist status for eating)  Activity Tolerance: Patient limited by lethargy Patient left: in bed;with call bell/phone within reach;with bed alarm set  OT Visit Diagnosis: Muscle weakness (generalized) (M62.81);Feeding difficulties (R63.3)                Time: 5409-8119 OT Time Calculation (min): 32 min Charges:  OT General Charges $OT Visit: 1 Visit OT Evaluation $OT Eval Moderate Complexity: 1 Mod OT Treatments $Self Care/Home Management : 8-22 mins Thresa Ross, OTS

## 2022-10-28 DIAGNOSIS — Z515 Encounter for palliative care: Secondary | ICD-10-CM

## 2022-10-28 DIAGNOSIS — M4712 Other spondylosis with myelopathy, cervical region: Secondary | ICD-10-CM

## 2022-10-28 DIAGNOSIS — I639 Cerebral infarction, unspecified: Secondary | ICD-10-CM | POA: Diagnosis not present

## 2022-10-28 DIAGNOSIS — E1142 Type 2 diabetes mellitus with diabetic polyneuropathy: Secondary | ICD-10-CM | POA: Diagnosis not present

## 2022-10-28 DIAGNOSIS — I5032 Chronic diastolic (congestive) heart failure: Secondary | ICD-10-CM | POA: Diagnosis not present

## 2022-10-28 DIAGNOSIS — Z7189 Other specified counseling: Secondary | ICD-10-CM

## 2022-10-28 LAB — BASIC METABOLIC PANEL
Anion gap: 8 (ref 5–15)
BUN: 10 mg/dL (ref 8–23)
CO2: 26 mmol/L (ref 22–32)
Calcium: 9 mg/dL (ref 8.9–10.3)
Chloride: 103 mmol/L (ref 98–111)
Creatinine, Ser: 0.92 mg/dL (ref 0.61–1.24)
GFR, Estimated: >60 mL/min (ref >60)
Glucose, Bld: 73 mg/dL (ref 70–99)
Potassium: 3.6 mmol/L (ref 3.5–5.1)
Sodium: 137 mmol/L (ref 135–145)

## 2022-10-28 LAB — GLUCOSE, CAPILLARY
Glucose-Capillary: 137 mg/dL — ABNORMAL HIGH (ref 70–99)
Glucose-Capillary: 106 mg/dL — ABNORMAL HIGH (ref 70–99)

## 2022-10-28 MED ORDER — VITAMIN B-12 1000 MCG PO TABS
1000.0000 ug | ORAL_TABLET | Freq: Every day | ORAL | Status: DC
  Administered 2022-10-28 – 2022-11-02 (×6): 1000 ug via ORAL
  Filled 2022-10-28 (×6): qty 1

## 2022-10-28 NOTE — Progress Notes (Signed)
Physical Therapy Treatment Patient Details Name: [name] [name] MRN: [id number] DOB: May 31, 1940 Today's Date: 10/28/2022   History of Present Illness Pt is an 82 y.o. male presenting to hospital 10/26/22 with generalized fatigue and malaise past 24-48 hours.  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  Pt admitted with R MCA embolic appearing stroke, L UE edema, a-fib, and hypercarbia.  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    PT Comments  Pt received in bed, able to state that he is at the hospital, denies pain. Pt assisted to EOB with MaxA. Static sitting for several minutes with B UE support and CG to ModA to maintain balance. No c/o pain or dizziness. Pt assisted back to bed in Right sidelying for pressure relief. Overall good tolerance for skilled PT session. Will continue to progress per POC.    Assistance Recommended at Discharge Frequent or constant Supervision/Assistance  If plan is discharge home, recommend the following:  Can travel by private vehicle    Two people to help with walking and/or transfers;Two people to help with bathing/dressing/bathroom;Assistance with cooking/housework;Assistance with feeding;Direct supervision/assist for medications management;Assist for transportation;Help with stairs or ramp for entrance   No  Equipment Recommendations  Other (comment) (TBD at next facility)    Recommendations for Other Services       Precautions / Restrictions Precautions Precautions: Fall;Cervical Precaution Comments: [place] cervical collar Required Braces or Orthoses: Cervical Brace Cervical Brace: Soft collar Restrictions Weight Bearing Restrictions: No     Mobility  Bed Mobility Overal bed mobility: Needs Assistance Bed Mobility: Rolling, Supine to Sit, Sit to  Supine Rolling: Mod assist, Max assist   Supine to sit: Max assist Sit to supine: Max assist        Transfers                   General transfer comment:  (Did not attempt)    Ambulation/Gait               General Gait Details: non-ambulatory   Stairs             Wheelchair Mobility     Tilt Bed    Modified Rankin (Stroke Patients Only)       Balance                                            Cognition Arousal/Alertness: Awake/alert Behavior During Therapy: Flat affect                       Current Attention Level: Focused   Following Commands: Follows one step commands inconsistently     Problem Solving: Slow processing, Requires verbal cues, Requires tactile cues General Comments:  (Cooperative)        Exercises      General Comments General comments (skin integrity, edema, etc.):  (Pt positioned on Right side to promote skin integrity and decrease risk for breakdown)      Pertinent Vitals/Pain Pain Assessment Pain Assessment: No/denies pain    Home Living                          Prior Function  PT Goals (current goals can now be found in the care plan section) Acute Rehab PT Goals Patient Stated Goal: to improve mobility    Frequency    Min 3X/week      PT Plan Current plan remains appropriate    Co-evaluation              AM-PAC PT "6 Clicks" Mobility   Outcome Measure  Help needed turning from your back to your side while in a flat bed without using bedrails?: A Lot Help needed moving from lying on your back to sitting on the side of a flat bed without using bedrails?: Total Help needed moving to and from a bed to a chair (including a wheelchair)?: Total Help needed standing up from a chair using your arms (e.g., wheelchair or bedside chair)?: Total Help needed to walk in hospital room?: Total Help needed climbing 3-5 steps with a railing? :  Total 6 Click Score: 7    End of Session   Activity Tolerance: Patient tolerated treatment well Patient left: in bed;with call bell/phone within reach;with bed alarm set Nurse Communication: Mobility status;Precautions PT Visit Diagnosis: Other abnormalities of gait and mobility (R26.89);Muscle weakness (generalized) (M62.81);Hemiplegia and hemiparesis Hemiplegia - Right/Left: Left Hemiplegia - caused by: Cerebral infarction     Time: 1020-1039 PT Time Calculation (min) (ACUTE ONLY): 19 min  Charges:    $Therapeutic Activity: 8-22 mins PT General Charges $$ ACUTE PT VISIT: 1 Visit                    Zadie Cleverly, PTA  Jannet Askew 10/28/2022, 10:51 AM

## 2022-10-28 NOTE — Progress Notes (Signed)
Got a call from CCMD, patient had A fib with 2.67 second pauses.no any other symptoms noted. MD made aware. No any active intervention now.

## 2022-10-28 NOTE — TOC Initial Note (Signed)
Transition of Care [facility]) - Initial/Assessment Note    Patient Details  Name: [name] [name] MRN: [id number] Date of Birth: Jun 26, 1940  [facility] CM/SW Contact:    Kemper Durie, RN Phone Number: 10/28/2022, 2:19 PM  Clinical Narrative:                  Spoke with wife, whom patient lives with.  Laren Everts, NP is PCP, uses [facility] and [facility] for medications.  Has hospital bed, wheelchair, and bedside commode in the home.  Wife agrees that patient would benefit from SNF for short term rehab, prefers [facility].  FL2 done, bed search initiated.    Expected Discharge Plan: Skilled Nursing Facility Barriers to Discharge: Continued Medical Work up   Patient Goals and CMS Choice Patient states their goals for this hospitalization and ongoing recovery are:: SNF for rehab prior to home CMS Medicare.gov Compare Post Acute Care list provided to:: Patient Represenative (must comment) Choice offered to / list presented to : Spouse      Expected Discharge Plan and Services     Post Acute Care Choice: Skilled Nursing Facility Living arrangements for the past 2 months: Single Family Home                                      Prior Living Arrangements/Services Living arrangements for the past 2 months: Single Family Home Lives with:: Spouse Patient language and need for interpreter reviewed:: Yes        Need for Family Participation in Patient Care: Yes (Comment) Care giver support system in place?: Yes (comment)   Criminal Activity/Legal Involvement Pertinent to Current Situation/Hospitalization: No - Comment as needed  Activities of Daily Living Home Assistive Devices/Equipment: Bedside commode/3-in-1, Hospital bed, Wheelchair ADL Screening (condition at time of admission) Patient's cognitive ability adequate to safely complete daily activities?: Yes Is the patient deaf or have difficulty hearing?: No Does the patient have difficulty seeing, even  when wearing glasses/contacts?: Yes Does the patient have difficulty concentrating, remembering, or making decisions?: No Patient able to express need for assistance with ADLs?: Yes Does the patient have difficulty dressing or bathing?: Yes Independently performs ADLs?: No Communication: Independent Dressing (OT): Needs assistance Grooming: Dependent Feeding: Independent Bathing: Needs assistance Toileting: Needs assistance In/Out Bed: Needs assistance Walks in Home: Dependent Does the patient have difficulty walking or climbing stairs?: Yes Weakness of Legs: None Weakness of Arms/Hands: Both  Permission Sought/Granted      Share Information with NAME: Thurston Hole  Permission granted to share info w AGENCY: SNF  Permission granted to share info w Relationship: wife  Permission granted to share info w Contact Information: [phone]  Emotional Assessment              Admission diagnosis:  Acute CVA (cerebrovascular accident) [facility]) [I63.9] Patient Active Problem List   Diagnosis Date Noted   Spinal stenosis in cervical region 10/27/2022   Spondylosis, cervical, 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   Acute CVA (cerebrovascular accident) (HCC) 10/26/2022   Sepsis due to undetermined organism (HCC) 08/10/2022   Cellulitis of second toe of left foot 08/10/2022   Generalized weakness 08/10/2022   Sepsis (HCC) 08/10/2022   Gangrene (HCC) 08/10/2022   AKI (acute kidney injury) (HCC) 08/10/2022   Hx of AKA (above knee amputation), 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   Acute blood loss anemia    Anemia of chronic disease    Leukocytosis  Chronic diastolic CHF (congestive heart failure) (HCC)    Moderate malnutrition (HCC) 03/11/2021   Acute osteomyelitis of right ankle (HCC) 03/08/2021   Open wound of right ankle 03/08/2021   Lactic acidosis 03/08/2021   Hypoalbuminemia due to protein-calorie malnutrition (HCC) 03/08/2021   Hyperglycemia due to diabetes mellitus (HCC) 03/08/2021   Hyponatremia  03/08/2021   Mixed hyperlipidemia 03/08/2021   Peripheral arterial disease (HCC) 03/08/2021   CHF (congestive heart failure) (HCC) 02/02/2021   Type 2 diabetes mellitus with peripheral neuropathy (HCC) 02/02/2021   Right leg weakness 11/19/2019   Vitamin B12 deficiency 05/20/2018   Vitamin D deficiency 05/20/2018   Cardiomyopathy (HCC) 07/29/2013   Asthma 02/19/2012   Essential hypertension 02/19/2012   Atrial fibrillation (HCC) 10/27/2011   GERD (gastroesophageal reflux disease) 10/27/2011   Dyslipidemia 10/27/2011   PCP:  Luciana Axe, NP Pharmacy:   [facility] - [place], [place] - [address] [place] [address] Phone: [phone] Fax: [phone]     Social Determinants of Health (SDOH) Social History: SDOH Screenings   Food Insecurity: No Food Insecurity (10/26/2022)  Housing: Low Risk  (10/26/2022)  Transportation Needs: No Transportation Needs (10/26/2022)  Utilities: Not At Risk (10/26/2022)  Tobacco Use: High Risk (10/26/2022)   SDOH Interventions:     Readmission Risk Interventions     No data to display

## 2022-10-28 NOTE — Progress Notes (Signed)
[facility]  - [place] [facility]   PATIENT NAME: [name] [name]    MR#:  [id number]  DATE OF BIRTH:  1940-10-01  SUBJECTIVE:  no family at bedside. No issues per RN other than mild bradycardia. Patient is symptomatic.    VITALS:  Blood pressure (!) 171/68, pulse (!) 48, temperature 97.7 F (36.5 C), temperature source Oral, resp. rate 18, height 6' (1.829 m), weight 89.1 kg, SpO2 100 %.  PHYSICAL EXAMINATION:   GENERAL:  82 y.o.-year-old patient with no acute distress.  LUNGS: Normal breath sounds bilaterally, no wheezing CARDIOVASCULAR: S1, S2 normal. No murmur   ABDOMEN: Soft, nontender, nondistended. Bowel sounds present.  EXTREMITIES: AKA stump ok NEUROLOGIC: lefts side weakness, mild left facial droop--limited exam SKIN: per RN   LABORATORY PANEL:  CBC Recent Labs  Lab 10/27/22 0436  WBC 5.8  HGB 11.2*  HCT 32.4*  PLT 159    Chemistries  Recent Labs  Lab 10/26/22 1136 10/27/22 0436 10/28/22 0604  NA 134* 136 137  K 3.1* 3.4* 3.6  CL 101 102 103  CO2 24 25 26   GLUCOSE 81 66* 73  BUN 6* 7* 10  CREATININE 0.88 0.87 0.92  CALCIUM 8.7* 8.7* 9.0  MG  --  1.7  --   AST 14*  --   --   ALT 8  --   --   ALKPHOS 67  --   --   BILITOT 1.3*  --   --    Cardiac Enzymes No results for input(s): "TROPONINI" in the last 168 hours. RADIOLOGY:  MR CERVICAL SPINE WO CONTRAST  Result Date: 10/27/2022 CLINICAL DATA:  Myelopathy, acute, cervical spine. EXAM: MRI CERVICAL SPINE WITHOUT CONTRAST TECHNIQUE: Multiplanar, multisequence MR imaging of the cervical spine was performed. No intravenous contrast was administered. COMPARISON:  None Available. FINDINGS: The study is partially degraded by motion. Alignment: 3 mm anterolisthesis of C3 over C4. Trace anterolisthesis at C5-6 and C6-7. Vertebrae: No fracture, evidence of discitis, or bone lesion. Cord: Prominent mass effect on the cord at C3-4 with associated T2 hyperintensity. Posterior Fossa, vertebral  arteries, paraspinal tissues: Thickening of the retro odontoid soft tissues. Disc levels: C2-3: Posterior disc osteophyte complex and ligamentum flavum thickening resulting in mild spinal canal stenosis. Uncovertebral and facet degenerative changes resulting in high-grade bilateral neural foraminal narrowing. C3-4: Anterolisthesis, posterior disc osteophyte complex and thickening of the ligamentum flavum resulting in severe spinal canal stenosis with severe mass effect on the cord. Uncovertebral and facet degenerative changes resulting in high-grade bilateral neural foraminal narrowing. C4-5: Small posterior disc osteophyte complex without significant spinal canal stenosis. Uncovertebral and facet degenerative changes resulting in high-grade bilateral neural foraminal narrowing. C5-6: Posterior disc osteophyte complex resulting in mild spinal canal stenosis. Uncovertebral and facet degenerative changes resulting in mild right and high-grade left neural foraminal narrowing. C6-7: Small posterior disc osteophyte complex without significant spinal canal stenosis. Uncovertebral and facet degenerative changes resulting in mild right and high-grade left neural foraminal narrowing. C7-T1: Posterior disc osteophyte complex resulting in mild spinal canal stenosis. Uncovertebral and facet degenerative changes resulting high-grade bilateral neural foraminal narrowing. IMPRESSION: 1. Advanced degenerative changes of the cervical spine, more pronounced at C3-4 where there is severe spinal canal stenosis with severe mass effect on the cord and associated cord signal abnormality. 2. Multilevel high-grade neural foraminal narrowing, as described above. Electronically Signed   By: Baldemar Lenis M.D.   On: 10/27/2022 16:37   ECHOCARDIOGRAM COMPLETE  Result Date: 10/27/2022  ECHOCARDIOGRAM REPORT   Patient Name:   [name] [name] Date of Exam: 10/26/2022 Medical Rec #:  [id number]    Height:       72.0 in Accession #:     [id number]   Weight:       196.4 lb Date of Birth:  [birth year]    BSA:          2.114 m Patient Age:    81 years     BP:           164/75 mmHg Patient Gender: M            HR:           56 bpm. Exam Location:  [facility] Procedure: 2D Echo Indications:     Stroke I63.9  History:         Patient has no prior history of Echocardiogram examinations.  Sonographer:     Overton Mam RDCS, FASE Referring Phys:  [id number] Gordy Councilman Diagnosing Phys: Marcina Millard MD IMPRESSIONS  1. Left ventricular ejection fraction, by estimation, is 65 to 70%. The left ventricle has normal function. The left ventricle has no regional wall motion abnormalities. There is moderate left ventricular hypertrophy. Indeterminate diastolic filling due  to E-A fusion.  2. Right ventricular systolic function is normal. The right ventricular size is normal.  3. The mitral valve is normal in structure. Mild mitral valve regurgitation. No evidence of mitral stenosis.  4. Tricuspid valve regurgitation is mild to moderate.  5. The aortic valve is normal in structure. Aortic valve regurgitation is not visualized. No aortic stenosis is present.  6. The inferior vena cava is normal in size with greater than 50% respiratory variability, suggesting right atrial pressure of 3 mmHg. FINDINGS  Left Ventricle: Left ventricular ejection fraction, by estimation, is 65 to 70%. The left ventricle has normal function. The left ventricle has no regional wall motion abnormalities. The left ventricular internal cavity size was normal in size. There is  moderate left ventricular hypertrophy. Indeterminate diastolic filling due to E-A fusion. Right Ventricle: The right ventricular size is normal. No increase in right ventricular wall thickness. Right ventricular systolic function is normal. Left Atrium: Left atrial size was normal in size. Right Atrium: Right atrial size was normal in size. Pericardium: There is no evidence of pericardial effusion. Mitral Valve:  The mitral valve is normal in structure. Mild mitral valve regurgitation. No evidence of mitral valve stenosis. Tricuspid Valve: The tricuspid valve is normal in structure. Tricuspid valve regurgitation is mild to moderate. No evidence of tricuspid stenosis. Aortic Valve: The aortic valve is normal in structure. Aortic valve regurgitation is not visualized. No aortic stenosis is present. Aortic valve peak gradient measures 11.6 mmHg. Pulmonic Valve: The pulmonic valve was normal in structure. Pulmonic valve regurgitation is not visualized. No evidence of pulmonic stenosis. Aorta: The aortic root is normal in size and structure. Venous: The inferior vena cava is normal in size with greater than 50% respiratory variability, suggesting right atrial pressure of 3 mmHg. IAS/Shunts: No atrial level shunt detected by color flow Doppler.  LEFT VENTRICLE PLAX 2D LVIDd:         3.70 cm   Diastology LVIDs:         2.20 cm   LV e' medial:    12.60 cm/s LV PW:         1.40 cm   LV E/e' medial:  6.0 LV IVS:        1.50 cm  LV e' lateral:   14.40 cm/s LVOT diam:     2.00 cm   LV E/e' lateral: 5.2 LV SV:         57 LV SV Index:   27 LVOT Area:     3.14 cm  RIGHT VENTRICLE RV Basal diam:  2.40 cm RV S prime:     12.40 cm/s TAPSE (M-mode): 1.7 cm LEFT ATRIUM           Index        RIGHT ATRIUM           Index LA diam:      3.20 cm 1.51 cm/m   RA Area:     13.30 cm LA Vol (A2C): 69.2 ml 32.73 ml/m  RA Volume:   30.00 ml  14.19 ml/m LA Vol (A4C): 46.0 ml 21.76 ml/m  AORTIC VALVE                 PULMONIC VALVE AV Area (Vmax): 1.68 cm     PV Vmax:       1.12 m/s AV Vmax:        170.00 cm/s  PV Peak grad:  5.0 mmHg AV Peak Grad:   11.6 mmHg LVOT Vmax:      91.00 cm/s LVOT Vmean:     58.400 cm/s LVOT VTI:       0.182 m  AORTA Ao Root diam: 3.40 cm Ao Asc diam:  3.30 cm MITRAL VALVE               TRICUSPID VALVE MV Area (PHT): 2.85 cm    TR Peak grad:   26.0 mmHg MV Decel Time: 266 msec    TR Vmax:        255.00 cm/s MV E velocity:  75.50 cm/s                            SHUNTS                            Systemic VTI:  0.18 m                            Systemic Diam: 2.00 cm Marcina Millard MD Electronically signed by Marcina Millard MD Signature Date/Time: 10/27/2022/2:30:37 PM    Final    MR BRAIN WO CONTRAST  Result Date: 10/26/2022 CLINICAL DATA:  Altered mental status, stroke follow-up. EXAM: MRI HEAD WITHOUT CONTRAST TECHNIQUE: Multiplanar, multiecho pulse sequences of the brain and surrounding structures were obtained without intravenous contrast. COMPARISON:  Same-day CT/CTA head and neck. FINDINGS: Brain: There is extensive diffusion restriction in the right MCA distribution involving the caudate and lentiform nucleus, dorsal lateral thalamus, posterior aspect of the insula, and temporal and parietal cortex with associated FLAIR signal abnormality consistent with acute to early subacute infarct. The frontal cortex is spared. There is mild gyral swelling and sulcal effacement but no midline shift. There is no evidence of hemorrhagic transformation. Background parenchymal volume is within expected limits for age. The ventricles are normal in size. There are small remote infarcts in the bilateral cerebellar hemispheres, thalami, and right basal ganglia superimposed on mild background chronic small-vessel ischemic change The pituitary and suprasellar region are normal. There is no mass lesion. There is no mass effect or midline shift. Vascular: SWI signal dropout in the right sylvian fissure likely reflects  clot in an M2 vessel. Skull and upper cervical spine: Normal marrow signal. Sinuses/Orbits: The paranasal sinuses are clear. Bilateral lens implants are in place. The globes and orbits are otherwise unremarkable. Other: The mastoid air cells and middle ear cavities are clear. IMPRESSION: Extensive acute to early subacute infarct in the right MCA distribution as above without hemorrhagic transformation or mass effect.  Electronically Signed   By: Lesia Hausen M.D.   On: 10/26/2022 18:17   CT ANGIO HEAD NECK W WO CM  Result Date: 10/26/2022 CLINICAL DATA:  Fatigue, altered mental status. EXAM: CT ANGIOGRAPHY HEAD AND NECK WITH AND WITHOUT CONTRAST TECHNIQUE: Multidetector CT imaging of the head and neck was performed using the standard protocol during bolus administration of intravenous contrast. Multiplanar CT image reconstructions and MIPs were obtained to evaluate the vascular anatomy. Carotid stenosis measurements (when applicable) are obtained utilizing NASCET criteria, using the distal internal carotid diameter as the denominator. RADIATION DOSE REDUCTION: This exam was performed according to the departmental dose-optimization program which includes automated exposure control, adjustment of the mA and/or kV according to patient size and/or use of iterative reconstruction technique. CONTRAST:  75mL OMNIPAQUE IOHEXOL 350 MG/ML SOLN COMPARISON:  Same-day CT head FINDINGS: CTA NECK FINDINGS Aortic arch: There is mild calcified plaque in the imaged aortic arch. The origins of the major branch vessels are patent. The subclavian arteries are patent to the level imaged. Right carotid system: The right common, internal, and external carotid arteries are patent, without hemodynamically significant stenosis or occlusion there is no evidence of dissection or aneurysm. Left carotid system: The left common, internal, and external carotid arteries are patent, without hemodynamically significant stenosis or occlusion. There is no evidence of dissection or aneurysm. Vertebral arteries: The right vertebral artery is occluded throughout its course with a minimal reconstitution of flow in the V3 segment. The left vertebral artery is diminutive in caliber but patent throughout. There is moderate stenosis at its origin. Skeleton: There is no acute osseous abnormality or suspicious osseous lesion. There is no visible canal hematoma. There is  probable severe spinal canal stenosis at C3-C4 with suspected cord compression. Other neck: Soft tissues of the neck are unremarkable. Upper chest: The imaged lung apices are clear. The esophagus is patulous and filled with debris. Review of the MIP images confirms the above findings CTA HEAD FINDINGS Anterior circulation: There is mild calcified plaque in the intracranial ICAs without significant stenosis or occlusion. The right M1 segment is patent. An inferior right M2 branch is occluded in the sylvian fissure (8-96), and a superior right M2/proximal M3 branch is occluded more distally (8-102). There is overall minimal collateral flow in the distal right MCA distribution (11-24). The left M1 segment and distal branches are patent, without proximal high-grade stenosis or occlusion. The bilateral ACAS are patent, without proximal stenosis or occlusion. The anterior communicating artery is normal. There is no aneurysm or AVM. Posterior circulation: The right V4 segment is occluded. The left V4 segment is markedly diminutive but patent. The basilar artery is diminutive and severely stenotic proximally. PICA is identified bilaterally. The superior cerebellar arteries are identified bilaterally The bilateral PCAs are patent, supplied by prominent posterior communicating arteries bilaterally. There is no proximal stenosis or occlusion. There is no aneurysm or AVM. Venous sinuses: Not well assessed due to bolus timing. Anatomic variants: As above. Review of the MIP images confirms the above findings IMPRESSION: 1. Occlusion of the inferior right M2 branch in the Sylvian fissure and a superior right  M2/proximal M3 branch more distaslly, with minimal collateral flow in the distal right MCA distribution. 2. 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 The left vertebral artery is diminutive in caliber but patent throughout. 3.  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 The bilateral PCAs are patent, supplied by prominent posterior communicating arteries bilaterally. 4. Patent carotid systems without hemodynamically significant stenosis or occlusion. 5. 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 Consider MRI of the cervical spine for further evaluation. 6. Patulous and debris-filled esophagus. Correlate with any history of reflux. Electronically Signed   By: Lesia Hausen M.D.   On: 10/26/2022 15:13    Assessment and Plan Harlen Tait is a 82 y.o. male  with medical history significant of HTN, CHF, T2DM, HLD, pancreatitis, atrial fibrillation on Xarelto, CKD Stage 2, who was brought to the hospital because of altered mental status.  According to his wife, patient had become more lethargic and weak.  It was noticed that patient was unable to grab a spoon.   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   --Per Neurology --Given size of his stroke would plan to resume anticoagulation on 11/01/22 if he remains clinically stable and there are no other contraindications to starting anticoagulation at that time, and reduce aspirin dose back to 81 mg at that time.   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 Katrinka Blazing at a very high risk for any neurosurgery. Continue to monitor. -- Cervical collar recommended. Patient not tolerating it too well.   Atrial fibrillation with slow ventricular response, bradycardia: Digoxin was held today.  -- Xarelto has also been held because of embolic stroke    Hypokalemia: Improving.  Continue potassium repletion.   Chronic diastolic CHF: Compensated.   Hypertension: Lisinopril, carvedilol and Lasix on hold.   Type II DM, mild  hypoglycemia: Metformin on hold.  Monitor glucose levels closely.   Family communication :none today Consults : neurology, neurosurgery CODE STATUS: full code DVT Prophylaxis : Lovenox Level of care: Telemetry Medical Status is: Inpatient Remains inpatient appropriate because: TOC for discharge planning to rehab    TOTAL TIME TAKING CARE OF THIS PATIENT: 35 minutes.  >50% time spent on counselling and coordination of care  Note: This dictation was prepared with Dragon dictation along with smaller phrase technology. Any transcriptional errors that result from this process are unintentional.  Enedina Finner M.D    [facility]   CC: Primary care physician; Luciana Axe, NP

## 2022-10-28 NOTE — [place] FL2 (Signed)
[place] MEDICAID FL2 LEVEL OF CARE FORM     IDENTIFICATION  Patient Name: [name] [name] Birthdate: Nov 19, 1940 Sex: male Admission Date (Current Location): 10/26/2022  [facility] and [place] Number:  Chiropodist and Address:  [facility], [address], [place], [place] [address]      Provider Number: [id number]  Attending Physician Name and Address:  Enedina Finner, MD  Relative Name and Phone Number:  TEDMAN, LILLY (Spouse) [phone] (Mobile)    Current Level of Care: Hospital Recommended Level of Care: Skilled Nursing Facility Prior Approval Number:    Date Approved/Denied:   PASRR Number: [id number]  Discharge Plan: SNF    Current Diagnoses: Patient Active Problem List   Diagnosis Date Noted   Spinal stenosis in cervical region 10/27/2022   Spondylosis, cervical, 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   Acute CVA (cerebrovascular accident) (HCC) 10/26/2022   Sepsis due to undetermined organism (HCC) 08/10/2022   Cellulitis of second toe of left foot 08/10/2022   Generalized weakness 08/10/2022   Sepsis (HCC) 08/10/2022   Gangrene (HCC) 08/10/2022   AKI (acute kidney injury) (HCC) 08/10/2022   Hx of AKA (above knee amputation), 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   Acute blood loss anemia    Anemia of chronic disease    Leukocytosis    Chronic diastolic CHF (congestive heart failure) (HCC)    Moderate malnutrition (HCC) 03/11/2021   Acute osteomyelitis of right ankle (HCC) 03/08/2021   Open wound of right ankle 03/08/2021   Lactic acidosis 03/08/2021   Hypoalbuminemia due to protein-calorie malnutrition (HCC) 03/08/2021   Hyperglycemia due to diabetes mellitus (HCC) 03/08/2021   Hyponatremia 03/08/2021   Mixed hyperlipidemia 03/08/2021   Peripheral arterial disease (HCC) 03/08/2021   CHF (congestive heart failure) (HCC) 02/02/2021   Type 2 diabetes mellitus with peripheral neuropathy (HCC) 02/02/2021   Right leg weakness 11/19/2019   Vitamin B12  deficiency 05/20/2018   Vitamin D deficiency 05/20/2018   Cardiomyopathy (HCC) 07/29/2013   Asthma 02/19/2012   Essential hypertension 02/19/2012   Atrial fibrillation (HCC) 10/27/2011   GERD (gastroesophageal reflux disease) 10/27/2011   Dyslipidemia 10/27/2011    Orientation RESPIRATION BLADDER Height & Weight     Self, Time    Incontinent Weight: 89.1 kg Height:  6' (182.9 cm)  BEHAVIORAL SYMPTOMS/MOOD NEUROLOGICAL BOWEL NUTRITION STATUS      Incontinent Diet  AMBULATORY STATUS COMMUNICATION OF NEEDS Skin   Extensive Assist Verbally Normal                       Personal Care Assistance Level of Assistance  Bathing, Feeding, Dressing Bathing Assistance: Maximum assistance Feeding assistance: Maximum assistance Dressing Assistance: Maximum assistance     Functional Limitations Info             SPECIAL CARE FACTORS FREQUENCY  PT (By licensed PT), OT (By licensed OT)     PT Frequency: 5 times a week OT Frequency: 5 times a week            Contractures Contractures Info: Not present    Additional Factors Info  Code Status, Allergies Code Status Info: Full Allergies Info: NKA           Current Medications (10/28/2022):  This is the current hospital active medication list Current Facility-Administered Medications  Medication Dose Route Frequency Provider Last Rate Last Admin   acetaminophen (TYLENOL) tablet 650 mg  650 mg Oral Q4H PRN Verdene Lennert, MD       Or  acetaminophen (TYLENOL) 160 MG/5ML solution 650 mg  650 mg Per Tube Q4H PRN Verdene Lennert, MD       Or   acetaminophen (TYLENOL) suppository 650 mg  650 mg Rectal Q4H PRN Verdene Lennert, MD       aspirin suppository 300 mg  300 mg Rectal Daily Bhagat, Srishti L, MD       Or   aspirin tablet 325 mg  325 mg Oral Daily Bhagat, Srishti L, MD   325 mg at 10/28/22 0944   atorvastatin (LIPITOR) tablet 40 mg  40 mg Oral Daily Verdene Lennert, MD   40 mg at 10/28/22 1610   cyanocobalamin  (VITAMIN B12) tablet 1,000 mcg  1,000 mcg Oral Daily Enedina Finner, MD       digoxin (LANOXIN) tablet 0.125 mg  0.125 mg Oral Daily Verdene Lennert, MD       enoxaparin (LOVENOX) injection 40 mg  40 mg Subcutaneous Q24H Verdene Lennert, MD   40 mg at 10/27/22 1950   ipratropium-albuterol (DUONEB) 0.5-2.5 (3) MG/3ML nebulizer solution 3 mL  3 mL Nebulization Q6H PRN Verdene Lennert, MD       senna-docusate (Senokot-S) tablet 1 tablet  1 tablet Oral QHS PRN Verdene Lennert, MD         Discharge Medications: Please see discharge summary for a list of discharge medications.  Relevant Imaging Results:  Relevant Lab Results:   Additional Information SS- [id number]  Kemper Durie, RN

## 2022-10-28 NOTE — Consult Note (Signed)
Consultation Note Date: 10/28/2022   Patient Name: [name] [name]  DOB: 24-Oct-1940  MRN: [id number]  Age / Sex: 82 y.o., male  PCP: Luciana Axe, NP Referring Physician: Enedina Finner, MD  Reason for Consultation: Establishing goals of care   HPI/Brief Hospital Course: 82 y.o. 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   Wife reported to ED staff, [name] [name] woke up Tuesday morning and was less responsive with generalized disorientation, presentation worsened into Wednesday which prompted visit-out of window for thrombolytic  CT head IMPRESSION: 1. 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 2. Asymmetric increased density within a branch of the right middle cerebral artery is identified, concerning for thrombus. 3. No sign of acute intracranial hemorrhage. 4. Chronic microvascular disease and brain atrophy.  Concern for spinal cord compression seen on CTA head/neck, MRI obtained  MR cervical spine IMPRESSION: 1. Advanced degenerative changes of the cervical spine, more pronounced at C3-4 where there is severe spinal canal stenosis with severe mass effect on the cord and associated cord signal abnormality. 2. Multilevel high-grade neural foraminal narrowing, as described above.  Neurosurgery consulted-surgical intervention felt to be high risk given advanced age, recent stroke and poor functional status at baseline  Palliative medicine was consulted for assisting with goals of care conversations  Subjective:  Extensive chart review has been completed prior to meeting patient including labs, vital signs, imaging, progress notes, orders, and available advanced directive documents from current and previous  encounters.  Visited with [name] [name] at his bedside. Awake, alert, wife-Annie at bedside assisting with feeding. [name] [name] able to answer simple orientation questions, responses delayed, requires redirection as he continues to ask for a bag of chewing tobacco.  Introduced myself as a Publishing rights manager as a member of the palliative care team. Explained palliative medicine is specialized medical care for people living with serious illness. It focuses on providing relief from the symptoms and stress of a serious illness. The goal is to improve quality of life for both the patient and the family.   Pattricia Boss shares a brief life review. She and [name] [name] have been married for over 60 years. They have one son-Fredrick and 2 granddaughters. [name] [name] lives at home with Pattricia Boss and she is his primary caregiver. At baseline, [name] [name] is wheelchair bound but remains fairly independent-able to bathe and dress himself, able to self transfer from wheelchair to power [facility] and lift chair, needs minimal assistance with toileting.  Pattricia Boss able to share her understanding of most recent medical condition and updates. Aware [name] [name] has had a stroke affecting the right side of his brain. Aware he may have functional deficits related to stroke. She was unclear on cervical MRI findings and evaluation made by neurosurgeon-provided updates regarding [name] [name] likely being high risk for surgical intervention related to myelopathy for which she agreed.  Overall, Pattricia Boss feels as though [name] [name] is making great progress and had noticeable improvement since admission.  Attempted to elicit goals of care. We discussed Code Status-Full Code versus Do Not Resuscitate. Encouraged patient/family to consider DNR/DNI status understanding evidenced based poor outcomes in similar hospitalized patients, as the cause of the arrest is likely associated with chronic/terminal disease rather than a reversible acute cardio-pulmonary event. Both Mr.  [name] and Pattricia Boss were clear  in stating they wish for him to remain Full Code at this time.  Pattricia Boss is hopeful [name] [name] can discharge to SNF for acute rehab to regain strength and mobility prior to him returning home. She continues to anticipate being his primary caregiver when he returns home.  All questions/concerns addressed. Emotional support provided to patient/family/support persons. PMT will continue to follow and support patient as needed.  Objective: Primary Diagnoses: Present on Admission:  Atrial fibrillation (HCC)  Type 2 diabetes mellitus with peripheral neuropathy (HCC)  Essential hypertension  Chronic diastolic CHF (congestive heart failure) (HCC)  Asthma  Vital Signs: BP (!) 171/68 (BP Location: Left Arm)   Pulse (!) 48   Temp 97.7 F (36.5 C) (Oral)   Resp 18   Ht 6' (1.829 m)   Wt 89.1 kg   SpO2 100%   BMI 26.64 kg/m  Pain Scale: 0-10   Pain Score: 0-No pain   IO: Intake/output summary:  Intake/Output Summary (Last 24 hours) at 10/28/2022 1546 Last data filed at 10/28/2022 1447 Gross per 24 hour  Intake --  Output 2600 ml  Net -2600 ml    LBM:   Baseline Weight: Weight: 89.1 kg Most recent weight: Weight: 89.1 kg         Assessment and Plan  SUMMARY OF RECOMMENDATIONS   Full Code/Full Scope Anticipate d/c SNF PMT to continue to follow for ongoing needs and support  Thank you for this consult and allowing Palliative Medicine to participate in the care of [facility]. Palliative medicine will continue to follow and assist as needed.   Time Total: 75 minutes  Time spent includes: Detailed review of medical records (labs, imaging, vital signs), medically appropriate exam (mental status, respiratory, cardiac, skin), discussed with treatment team, counseling and educating patient, family and staff, documenting clinical information, medication management and coordination of care.   Signed by: Leeanne Deed, DNP, AGNP-C Palliative Medicine     Please contact Palliative Medicine Team phone at [phone] for questions and concerns.  For individual provider: See Loretha Stapler

## 2022-10-29 DIAGNOSIS — I639 Cerebral infarction, unspecified: Secondary | ICD-10-CM | POA: Diagnosis not present

## 2022-10-29 DIAGNOSIS — M4712 Other spondylosis with myelopathy, cervical region: Secondary | ICD-10-CM | POA: Diagnosis not present

## 2022-10-29 DIAGNOSIS — I5032 Chronic diastolic (congestive) heart failure: Secondary | ICD-10-CM | POA: Diagnosis not present

## 2022-10-29 LAB — GLUCOSE, CAPILLARY
Glucose-Capillary: 72 mg/dL (ref 70–99)
Glucose-Capillary: 124 mg/dL — ABNORMAL HIGH (ref 70–99)
Glucose-Capillary: 94 mg/dL (ref 70–99)
Glucose-Capillary: 81 mg/dL (ref 70–99)

## 2022-10-29 MED ORDER — LISINOPRIL 5 MG PO TABS
15.0000 mg | ORAL_TABLET | Freq: Every day | ORAL | Status: DC
  Administered 2022-10-29 – 2022-11-02 (×5): 15 mg via ORAL
  Filled 2022-10-29 (×5): qty 1

## 2022-10-29 NOTE — Progress Notes (Signed)
[facility]  -  at [facility]   PATIENT NAME: [name] [name]    MR#:  [id number]  DATE OF BIRTH:  May 03, 1940  SUBJECTIVE:  wife [name] [name] at bedside at bedside. No issues per RN other than mild bradycardia. Patient is symptomatic. According to the wife she seems patient is improving.    VITALS:  Blood pressure (!) 166/84, pulse (!) 50, temperature 98.3 F (36.8 C), resp. rate 18, height 6' (1.829 m), weight 89.1 kg, SpO2 99 %.  PHYSICAL EXAMINATION:   GENERAL:  82 y.o.-year-old patient with no acute distress.  LUNGS: Normal breath sounds bilaterally, no wheezing CARDIOVASCULAR: S1, S2 normal. No murmur   ABDOMEN: Soft, nontender, nondistended.  EXTREMITIES: AKA stump ok NEUROLOGIC: mild left side weakness, mild left facial droop--limited exam SKIN: per RN   LABORATORY PANEL:  CBC Recent Labs  Lab 10/27/22 0436  WBC 5.8  HGB 11.2*  HCT 32.4*  PLT 159     Chemistries  Recent Labs  Lab 10/26/22 1136 10/27/22 0436 10/28/22 0604  NA 134* 136 137  K 3.1* 3.4* 3.6  CL 101 102 103  CO2 24 25 26   GLUCOSE 81 66* 73  BUN 6* 7* 10  CREATININE 0.88 0.87 0.92  CALCIUM 8.7* 8.7* 9.0  MG  --  1.7  --   AST 14*  --   --   ALT 8  --   --   ALKPHOS 67  --   --   BILITOT 1.3*  --   --     Cardiac Enzymes No results for input(s): "TROPONINI" in the last 168 hours. RADIOLOGY:  MR CERVICAL SPINE WO CONTRAST  Result Date: 10/27/2022 CLINICAL DATA:  Myelopathy, acute, cervical spine. EXAM: MRI CERVICAL SPINE WITHOUT CONTRAST TECHNIQUE: Multiplanar, multisequence MR imaging of the cervical spine was performed. No intravenous contrast was administered. COMPARISON:  None Available. FINDINGS: The study is partially degraded by motion. Alignment: 3 mm anterolisthesis of C3 over C4. Trace anterolisthesis at C5-6 and C6-7. Vertebrae: No fracture, evidence of discitis, or bone lesion. Cord: Prominent mass effect on the cord at C3-4 with associated T2 hyperintensity.  Posterior Fossa, vertebral arteries, paraspinal tissues: Thickening of the retro odontoid soft tissues. Disc levels: C2-3: Posterior disc osteophyte complex and ligamentum flavum thickening resulting in mild spinal canal stenosis. Uncovertebral and facet degenerative changes resulting in high-grade bilateral neural foraminal narrowing. C3-4: Anterolisthesis, posterior disc osteophyte complex and thickening of the ligamentum flavum resulting in severe spinal canal stenosis with severe mass effect on the cord. Uncovertebral and facet degenerative changes resulting in high-grade bilateral neural foraminal narrowing. C4-5: Small posterior disc osteophyte complex without significant spinal canal stenosis. Uncovertebral and facet degenerative changes resulting in high-grade bilateral neural foraminal narrowing. C5-6: Posterior disc osteophyte complex resulting in mild spinal canal stenosis. Uncovertebral and facet degenerative changes resulting in mild right and high-grade left neural foraminal narrowing. C6-7: Small posterior disc osteophyte complex without significant spinal canal stenosis. Uncovertebral and facet degenerative changes resulting in mild right and high-grade left neural foraminal narrowing. C7-T1: Posterior disc osteophyte complex resulting in mild spinal canal stenosis. Uncovertebral and facet degenerative changes resulting high-grade bilateral neural foraminal narrowing. IMPRESSION: 1. Advanced degenerative changes of the cervical spine, more pronounced at C3-4 where there is severe spinal canal stenosis with severe mass effect on the cord and associated cord signal abnormality. 2. Multilevel high-grade neural foraminal narrowing, as described above. Electronically Signed   By: Baldemar Lenis M.D.   On: 10/27/2022 16:37  Assessment and Plan [name] [name] is a 82 y.o. male  with medical history significant of HTN, CHF, T2DM, HLD, pancreatitis, atrial fibrillation on Xarelto, CKD Stage  2, who was brought to the hospital because of altered mental status.  According to his wife, patient had become more lethargic and weak.  It was noticed that patient was unable to grab a spoon.   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   --Per Neurology --Given size of his stroke would plan to resume anticoagulation on 11/01/22 if he remains clinically stable and there are no other contraindications to starting anticoagulation at that time, and reduce aspirin dose back to 81 mg at that time.   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 Katrinka Blazing at a very high risk for any neurosurgery. Continue to monitor. -- Cervical collar recommended. Patient not tolerating it too well.   Atrial fibrillation with slow ventricular response, bradycardia:  -- Coreg and digoxin was held today.  -- Xarelto has also been held because of embolic stroke    Hypokalemia: Improving.  Continue potassium repletion.   Chronic diastolic CHF: Compensated.   Hypertension: resume lisinopril   Type II DM, mild hypoglycemia: Metformin on hold.  Monitor glucose levels closely.   Family communication : life any at bedside Consults : neurology, neurosurgery CODE STATUS: full code DVT Prophylaxis : Lovenox Level of care: Telemetry Medical Status is: Inpatient Remains inpatient appropriate because: TOC for discharge planning to rehab    TOTAL TIME TAKING CARE OF THIS PATIENT: 35 minutes.  >50% time spent on counselling and coordination of care  Note: This dictation was prepared with Dragon dictation along with smaller phrase technology. Any transcriptional errors that result from this process are unintentional.  Enedina Finner M.D    [facility]   CC: Primary care physician; Luciana Axe, NP

## 2022-10-29 NOTE — Progress Notes (Signed)
Daily Progress Note   Patient Name: [name] [name]       Date: 10/29/2022 DOB: 1941/02/11  Age: 82 y.o. MRN#: [id number] Attending Physician: Enedina Finner, MD Primary Care Physician: Luciana Axe, NP Admit Date: 10/26/2022  Reason for Consultation/Follow-up: Establishing goals of care  HPI/Brief Hospital Review:  82 y.o. 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    Wife reported to ED staff, Mr. Ussery woke up Tuesday morning and was less responsive with generalized disorientation, presentation worsened into Wednesday which prompted visit-out of window for thrombolytic   CT head IMPRESSION: 1. 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 2. Asymmetric increased density within a branch of the right middle cerebral artery is identified, concerning for thrombus. 3. No sign of acute intracranial hemorrhage. 4. Chronic microvascular disease and brain atrophy.   Concern for spinal cord compression seen on CTA head/neck, MRI obtained   MR cervical spine IMPRESSION: 1. Advanced degenerative changes of the cervical spine, more pronounced at C3-4 where there is severe spinal canal stenosis with severe mass effect on the cord and associated cord signal abnormality. 2. Multilevel high-grade neural foraminal narrowing, as described above.   Neurosurgery consulted-surgical intervention felt to be high risk given advanced age, recent stroke and poor functional status at baseline   Palliative medicine was consulted for assisting with goals of care  conversations  Subjective: Extensive chart review has been completed prior to meeting patient including labs, vital signs, imaging, progress notes, orders, and available advanced directive documents from current and previous encounters.    Visited with Mr. Friedel at his bedside, asleep with eyes closed, no distress noted but does not acknowledge my presence in room.  Wife-Annie at bedside. Pattricia Boss shares she assisted Mr. Wiens with eating his lunch-feels there is continued improvement.  Answered questions regarding discharge to SNF-pending insurance and bed offers. TOC to assist with this process.  Goals remain clear, Pattricia Boss anticipating Mr. Barbee to return home after SNF, eager to remain primary caregiver in their home.  Answered and addressed all questions and concerns. Shared with Pattricia Boss this is my last day on services,  provider from PMT available if needs arise through week-encouraged to call team phone as needed.  Thank you for allowing the Palliative Medicine Team to assist in the care of this patient.  Total time:  25 minutes  Time spent includes: Detailed review of medical records (labs, imaging, vital signs), medically appropriate exam (mental status, respiratory, cardiac, skin), discussed with treatment team, counseling and educating patient, family and staff, documenting clinical information, medication management and coordination of care.  Leeanne Deed, DNP, AGNP-C Palliative Medicine   Please contact Palliative Medicine Team phone at [phone] for questions and concerns.

## 2022-10-30 DIAGNOSIS — I639 Cerebral infarction, unspecified: Secondary | ICD-10-CM | POA: Diagnosis not present

## 2022-10-30 LAB — GLUCOSE, CAPILLARY
Glucose-Capillary: 72 mg/dL (ref 70–99)
Glucose-Capillary: 90 mg/dL (ref 70–99)
Glucose-Capillary: 107 mg/dL — ABNORMAL HIGH (ref 70–99)
Glucose-Capillary: 87 mg/dL (ref 70–99)

## 2022-10-30 MED ORDER — FUROSEMIDE 20 MG PO TABS
20.0000 mg | ORAL_TABLET | Freq: Every day | ORAL | Status: DC
  Administered 2022-10-30 – 2022-11-02 (×4): 20 mg via ORAL
  Filled 2022-10-30 (×4): qty 1

## 2022-10-30 NOTE — Progress Notes (Addendum)
Called to see patient at the bedside because of concern of mental status change.  Patient has some cognitive decline/dementia at baseline. Most recent vital signs: Temperature 98.3, respiratory rate 14, heart rate 54, BP 114/91, oxygen saturation 100% on room air at 4 PM.  Patient is alert and oriented to person and place at the time of my visit.  Pupils equal and reactive to light, extraocular muscles intact.  Patient has some left-sided neglect (from recent stroke) which is nothing new.  No eye rolling was observed.  Tongue is midline.  Speech is clear.  He has good strength in bilateral upper extremities.  Left hand was slightly cooler to touch than the right.  No acute change from this morning's exam noted at this time.  Informed Debra, RN, and her colleague at the bedside, about my findings.  Recommended that patient be monitored closely and MD be notified of any acute change in his condition.

## 2022-10-30 NOTE — Progress Notes (Signed)
OT was working with patient and was having concerns about the patients left arm being cold to the touch and the right arm WNL. Patient was lethargic but would wake up easily for her. He was also mumbling and closing his eyes.  Went in to assess the patient and the patients left arm was cold and right arm WNL. Pulses were palpated bilaterally and were WNL. Patient was more lethargic but could tell me his name and where he was. Patient eyes were rolling in the back of his head like he could not keep his eyes open. His NIH went from a 3 to a 6. Patient did not have any visual on the left side. He was unable to focus and follow commands.  He was weaker when squeezing my hands. Paged MD and spoke to ICU charge. Both arrived to room. MD assessed patient. No new orders at this time.

## 2022-10-30 NOTE — Progress Notes (Signed)
I was called by pt's primary RN to come to bedside and assess pt d/t change in mental status and NIH. On my arrival pt is laying in bed and Dr. Myriam Forehand at bedside assessing patient. Pt's neuro status is difficult to assess d/t pt's baseline and recent stroke on admission. Pt is not consistent with following commands or answering orientation questions. Pt is able to tell MD what his name is and where he is at this time. Pt does not look over to his left side at all during the assessment performed by MD. Strength is normal in bilateral arms. All VS stable at this time. No new orders received.

## 2022-10-30 NOTE — Care Management Important Message (Signed)
Important Message  Patient Details  Name: [name] [name] MRN: [id number] Date of Birth: 04/04/1941   Medicare Important Message Given:  Yes     [name] A Ritu Gagliardo 10/30/2022, 8:38 AM

## 2022-10-30 NOTE — Progress Notes (Signed)
Physical Therapy Treatment Patient Details Name: [name] [name] MRN: [id number] DOB: [birth year] Today's Date: 10/30/2022   History of Present Illness Pt is an 82 y.o. male presenting to hospital 10/26/22 with generalized fatigue and malaise past 24-48 hours.  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  Pt admitted with R MCA embolic appearing stroke, L UE edema, a-fib, and hypercarbia.  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    PT Comments  Pt resting in bed upon PT arrival; agreeable to therapy.  During session pt mod to max assist with bed mobility and mod to max assist with sitting balance (pt leaning either L, R, or posterior intermittently requiring vc's and assist to correct for upright/midline positioning).  Pt able to sit on edge of bed for about 5 minutes before fatiguing and requesting to lay down.  Will continue to focus on strengthening, sitting balance, and progressive functional mobility per pt tolerance.    Assistance Recommended at Discharge Frequent or constant Supervision/Assistance  If plan is discharge home, recommend the following:  Can travel by private vehicle    Two people to help with walking and/or transfers;Two people to help with bathing/dressing/bathroom;Assistance with cooking/housework;Assistance with feeding;Direct supervision/assist for medications management;Assist for transportation;Help with stairs or ramp for entrance   No  Equipment Recommendations  Other (comment) (TBD at next facility)    Recommendations for Other Services       Precautions / Restrictions Precautions Precautions: Fall;Cervical Precaution Comments: [place] cervical collar Required Braces or Orthoses: Cervical Brace Cervical Brace: Soft collar Restrictions Weight Bearing Restrictions: No     Mobility  Bed Mobility Overal bed mobility: Needs Assistance Bed Mobility: Rolling, Supine to Sit, Sit to Supine Rolling: Mod assist, Max assist   Supine to sit: Max assist Sit to supine: Max assist   General bed mobility comments: via logrolling; vc's for technique; assist for trunk and B LE's    Transfers                   General transfer comment: pt unable to sit upright long enough to attempt    Ambulation/Gait                   Stairs             Wheelchair Mobility     Tilt Bed    Modified Rankin (Stroke Patients Only)       Balance Overall balance assessment: Needs assistance Sitting-balance support: Bilateral upper extremity supported Sitting balance-Leahy Scale: Poor Sitting balance - Comments: pt leaning L/R/posterior intermittently requiring vc's and assist for upright positioning                                    Cognition Arousal/Alertness: Awake/alert (Pt often with eyes closed though) Behavior During Therapy: Flat affect Overall Cognitive Status: Impaired/Different from baseline Area of Impairment: Attention, Following commands, Awareness, Problem solving, Orientation                   Current Attention Level: Focused   Following Commands: Follows one step commands inconsistently   Awareness: Emergent Problem Solving: Slow processing, Requires verbal cues, Requires tactile cues          Exercises      General Comments  Nursing cleared pt for participation in physical therapy.  Pt agreeable to PT session.      Pertinent Vitals/Pain Pain Assessment Pain Assessment: Faces Faces Pain Scale: No hurt Pain Intervention(s): Limited activity within patient's tolerance, Monitored during session Vitals (HR and SpO2 on room air) stable and WFL throughout treatment session.    Home Living                          Prior Function            PT Goals (current goals can now be found  in the care plan section) Acute Rehab PT Goals Patient Stated Goal: to improve mobility PT Goal Formulation: With patient Time For Goal Achievement: 11/10/22 Potential to Achieve Goals: Fair Progress towards PT goals: Progressing toward goals    Frequency    Min 1X/week      PT Plan Frequency needs to be updated (Frequency adjusted based on updated pt centered delivery of care model therapy guidelines.)    Co-evaluation              AM-PAC PT "6 Clicks" Mobility   Outcome Measure  Help needed turning from your back to your side while in a flat bed without using bedrails?: A Lot Help needed moving from lying on your back to sitting on the side of a flat bed without using bedrails?: A Lot Help needed moving to and from a bed to a chair (including a wheelchair)?: Total Help needed standing up from a chair using your arms (e.g., wheelchair or bedside chair)?: Total Help needed to walk in hospital room?: Total Help needed climbing 3-5 steps with a railing? : Total 6 Click Score: 8    End of Session Equipment Utilized During Treatment: Cervical collar Activity Tolerance: Patient tolerated treatment well Patient left: in bed;with call bell/phone within reach;with bed alarm set;with nursing/sitter in room Nurse Communication: Mobility status;Precautions PT Visit Diagnosis: Other abnormalities of gait and mobility (R26.89);Muscle weakness (generalized) (M62.81);Hemiplegia and hemiparesis Hemiplegia - Right/Left: Left Hemiplegia - caused by: Cerebral infarction     Time: 1610-9604 PT Time Calculation (min) (ACUTE ONLY): 15 min  Charges:    $Therapeutic Activity: 8-22 mins PT General Charges $$ ACUTE PT VISIT: 1 Visit                     Hendricks Limes, PT 10/30/22, 1:12 PM

## 2022-10-30 NOTE — Progress Notes (Signed)
Occupational Therapy Treatment Patient Details Name: [name] [name] MRN: [id number] DOB: March 29, 1941 Today's Date: 10/30/2022   History of present illness Pt is an 82 y.o. male presenting to hospital 10/26/22 with generalized fatigue and malaise past 24-48 hours.  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  Pt admitted with R MCA embolic appearing stroke, L UE edema, a-fib, and hypercarbia.  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   OT comments  Pt received supine in bed, lethargic but awakens easily to OT voice. Difficulty answering orientation questions, pt frequently closing eyes and mumbling. Requires TOTAL to don c-collar, upon further assessment, pt's LUE with significant temperature changes when compared to RUE. LUE cold to the touch, pt able to raise arms with encouragement but unable to stay alert for more than a few seconds. Attempted visual scanning but pt's eyes rolling backwards, unable to fixate. Further mobility not attempted due to patient's change in status. Alerted RN and MD via secure chat, NT entering room for vitals. Pt repositioned for comfort with TOTAL A. RN paging neurology to check on pt. Pt left with staff in room, bed alarm on and needs within reach.    Recommendations for follow up therapy are one component of a multi-disciplinary discharge planning process, led by the attending physician.  Recommendations may be updated based on patient status, additional functional criteria and insurance authorization.    Assistance Recommended at Discharge Frequent or constant Supervision/Assistance  Patient can return home with the following  Two people to help with walking and/or transfers;A lot of help with bathing/dressing/bathroom;Assistance with cooking/housework;Assistance with feeding;Direct  supervision/assist for medications management;Assist for transportation   Equipment Recommendations  Other (comment)       Precautions / Restrictions Precautions Precautions: Fall;Cervical Precaution Comments: [place] cervical collar Required Braces or Orthoses: Cervical Brace Cervical Brace: Soft collar Restrictions Weight Bearing Restrictions: No (B)       Mobility Bed Mobility Overal bed mobility: Needs Assistance             General bed mobility comments:  (Did not attempt further mobility d/t change in pt status (notified RN and MD via chat, discussed with NT/RN in person).)    Transfers                             ADL either performed or assessed with clinical judgement   ADL                                         General ADL Comments: TOTAL A to don c-collar, repositions trunk with MOD - MAX A for comfort / sore prevention.    Extremity/Trunk Assessment Upper Extremity Assessment Upper Extremity Assessment: LUE deficits/detail LUE Deficits / Details: hemiparetic UE, able to raise arm (extends tricep, makes composite fist)             Cognition Arousal/Alertness: Lethargic Behavior During Therapy: Flat affect Overall Cognitive Status: Impaired/Different from baseline Area of Impairment: Attention, Following commands, Awareness, Problem solving, Orientation                 Orientation Level: Disoriented to, Person, Place, Time, Situation     Following Commands: Follows one step commands inconsistently  Awareness: Emergent Problem Solving: Slow processing, Requires verbal cues, Requires tactile cues General Comments: Pt lethargic, only mumbling when asked orientation questions, inconsistent 1-step command following.                   Pertinent Vitals/ Pain       Pain Assessment Pain Assessment: No/denies pain   Frequency  Min 1X/week        Progress Toward Goals  OT Goals(current goals  can now be found in the care plan section)     Acute Rehab OT Goals Time For Goal Achievement: 11/10/22 Potential to Achieve Goals: Good  Plan Discharge plan remains appropriate       AM-PAC OT "6 Clicks" Daily Activity     Outcome Measure   Help from another person eating meals?: A Lot Help from another person taking care of personal grooming?: A Lot Help from another person toileting, which includes using toliet, bedpan, or urinal?: Total Help from another person bathing (including washing, rinsing, drying)?: A Lot Help from another person to put on and taking off regular upper body clothing?: A Lot Help from another person to put on and taking off regular lower body clothing?: A Lot 6 Click Score: 11    End of Session Equipment Utilized During Treatment: Cervical collar  OT Visit Diagnosis: Muscle weakness (generalized) (M62.81);Feeding difficulties (R63.3)   Activity Tolerance Patient limited by lethargy   Patient Left in bed;with call bell/phone within reach;with bed alarm set;with nursing/sitter in room   Nurse Communication Other (comment) (Alerted RN/MD to temperature changes in L UE. Pt's LUE cold to touch, pt appearing confused/disoriented. NT enters room to assess vitals, pt left with RN/NT.)        Time: 1541-1601 OT Time Calculation (min): 20 min  Charges: OT General Charges $OT Visit: 1 Visit OT Treatments $Self Care/Home Management : 8-22 mins  [name] L. Ameilia Rattan, OTR/L  10/30/22, 4:23 PM

## 2022-10-30 NOTE — TOC Progression Note (Signed)
Transition of Care [facility]) - Progression Note    Patient Details  Name: [name] [name] MRN: [id number] Date of Birth: Dec 23, 1940  Transition [facility]) CM/SW Contact  [name], RN Phone Number: 10/30/2022, 12:33 PM  Clinical Narrative:   Chart reviewed.  Noted that patient was admitted with Acute Stroke.  Informed [name] of bed offers and she has accepted bed offer at [facility]. I have informed Revonda Standard with [facility] that patient has accepted bed offer.    TOC will start SNF authorization when patient medically stable for discharge.    Expected Discharge Plan: Skilled Nursing Facility Barriers to Discharge: Continued Medical Work up  Expected Discharge Plan and Services     Post Acute Care Choice: Skilled Nursing Facility Living arrangements for the past 2 months: Single Family Home                                       Social Determinants of Health (SDOH) Interventions SDOH Screenings   Food Insecurity: No Food Insecurity (10/26/2022)  Housing: Low Risk  (10/26/2022)  Transportation Needs: No Transportation Needs (10/26/2022)  Utilities: Not At Risk (10/26/2022)  Tobacco Use: High Risk (10/26/2022)    Readmission Risk Interventions     No data to display

## 2022-10-30 NOTE — Progress Notes (Addendum)
Progress Note    [name] [name]  ZOX:[id number] DOB: 1940-07-30  DOA: 10/26/2022 PCP: Luciana Axe, NP      Brief Narrative:    Medical records reviewed and are as summarized below:  [name] [name] is a 82 y.o. male  with medical history significant of HTN, CHF, T2DM, HLD, pancreatitis, atrial fibrillation on Xarelto, CKD Stage 2, who was brought to the hospital because of altered mental status.  According to his wife, patient had become more lethargic and weak.  It was noticed that patient was unable to grab a spoon.      Assessment/Plan:   Principal Problem:   Acute CVA (cerebrovascular accident) [facility]) Active Problems:   Atrial fibrillation (HCC)   Asthma   Type 2 diabetes mellitus with peripheral neuropathy (HCC)   Essential hypertension   Chronic diastolic CHF (congestive heart failure) (HCC)   Spinal stenosis in cervical region   Spondylosis, cervical, with myelopathy    Body mass index is 26.64 kg/m.   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  Follow-up with neurologist for further recommendations.  2D echo showed EF estimated at 65 to 70%, moderate LVH,, mild MR, mild to moderate TR. No hypercarbia on venous blood gas   Atrial fibrillation with slow ventricular response, bradycardia: Carvedilol.  On hold.  He is on digoxin.  Xarelto has also been held because of embolic stroke.  Dr. 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 Katrinka Blazing, neurosurgeon, on 10/27/2022.  He is not a surgical candidate per neurosurgeon.   Hypokalemia: Improved   Chronic diastolic CHF: Compensated.  Restart Lasix.   Hypertension: Continue lisinopril.   Type II DM, mild hypoglycemia: Metformin on hold.  Monitor glucose levels closely.   History of asthma      Diet Order              Diet regular Room service appropriate? Yes with Assist; Fluid consistency: Thin  Diet effective now                            Consultants: Neurologist Palliative care Neurosurgeon  Procedures: None    Medications:    aspirin  300 mg Rectal Daily   Or   aspirin  325 mg Oral Daily   atorvastatin  40 mg Oral Daily   cyanocobalamin  1,000 mcg Oral Daily   digoxin  0.125 mg Oral Daily   enoxaparin (LOVENOX) injection  40 mg Subcutaneous Q24H   lisinopril  15 mg Oral Daily   Continuous Infusions:   Anti-infectives (From admission, onward)    None              Family Communication/Anticipated D/C date and plan/Code Status   DVT prophylaxis: enoxaparin (LOVENOX) injection 40 mg Start: 10/26/22 2000     Code Status: Full Code  Family Communication: None Disposition Plan: Plan to discharge to SNF   Status is: Inpatient Remains inpatient appropriate because: acute stroke       Subjective:   Interval events noted.  He is more alert and communicative today.  He has no complaints.  Remi Haggard, a friend, was at the bedside  Objective:    Vitals:   10/29/22 2002 10/30/22 0530 10/30/22 0752 10/30/22 0831  BP: (!) 168/82 (!) 153/81 (!) 158/82   Pulse: Marland Kitchen)  56 (!) 109 70 72  Resp: 18 19 16    Temp: 98.1 F (36.7 C) 97.8 F (36.6 C) 97.9 F (36.6 C)   TempSrc: Oral Oral    SpO2: 100% 100% 100%   Weight:      Height:       No data found.   Intake/Output Summary (Last 24 hours) at 10/30/2022 1042 Last data filed at 10/30/2022 1030 Gross per 24 hour  Intake 100 ml  Output 1400 ml  Net -1300 ml   Filed Weights   10/26/22 1119  Weight: 89.1 kg    Exam:   GEN: NAD SKIN: Warm and dry EYES: No pallor or icterus ENT: MMM CV: RRR PULM: CTA B ABD: soft, ND, NT, +BS CNS: AAO x 1 (person), non focal EXT: Bilateral AKA.  No edema or tenderness      Data Reviewed:   I have personally reviewed following labs and imaging  studies:  Labs: Labs show the following:   Basic Metabolic Panel: Recent Labs  Lab 10/26/22 1136 10/27/22 0436 10/28/22 0604  NA 134* 136 137  K 3.1* 3.4* 3.6  CL 101 102 103  CO2 24 25 26   GLUCOSE 81 66* 73  BUN 6* 7* 10  CREATININE 0.88 0.87 0.92  CALCIUM 8.7* 8.7* 9.0  MG  --  1.7  --   PHOS  --  2.8  --    GFR Estimated Creatinine Clearance: 70.3 mL/min (by C-G formula based on SCr of 0.92 mg/dL). Liver Function Tests: Recent Labs  Lab 10/26/22 1136  AST 14*  ALT 8  ALKPHOS 67  BILITOT 1.3*  PROT 6.9  ALBUMIN 3.2*   No results for input(s): "LIPASE", "AMYLASE" in the last 168 hours. No results for input(s): "AMMONIA" in the last 168 hours. Coagulation profile No results for input(s): "INR", "PROTIME" in the last 168 hours.  CBC: Recent Labs  Lab 10/26/22 1136 10/27/22 0436  WBC 7.1 5.8  NEUTROABS 4.2 2.9  HGB 12.4* 11.2*  HCT 36.3* 32.4*  MCV 83.6 82.7  PLT 163 159   Cardiac Enzymes: No results for input(s): "CKTOTAL", "CKMB", "CKMBINDEX", "TROPONINI" in the last 168 hours. BNP (last 3 results) No results for input(s): "PROBNP" in the last 8760 hours. CBG: Recent Labs  Lab 10/29/22 0744 10/29/22 1120 10/29/22 1615 10/29/22 2003 10/30/22 0754  GLUCAP 72 124* 94 81 72   D-Dimer: No results for input(s): "DDIMER" in the last 72 hours. Hgb A1c: No results for input(s): "HGBA1C" in the last 72 hours.  Lipid Profile: No results for input(s): "CHOL", "HDL", "LDLCALC", "TRIG", "CHOLHDL", "LDLDIRECT" in the last 72 hours.  Thyroid function studies: No results for input(s): "TSH", "T4TOTAL", "T3FREE", "THYROIDAB" in the last 72 hours.  Invalid input(s): "FREET3" Anemia work up: No results for input(s): "VITAMINB12", "FOLATE", "FERRITIN", "TIBC", "IRON", "RETICCTPCT" in the last 72 hours. Sepsis Labs: Recent Labs  Lab 10/26/22 1130 10/26/22 1136 10/26/22 1309 10/27/22 0436  WBC  --  7.1  --  5.8  LATICACIDVEN 1.3  --  1.4  --      Microbiology No results found for this or any previous visit (from the past 240 hour(s)).  Procedures and diagnostic studies:  No results found.             LOS: 4 days   Nuchem Grattan  [facility]   Pager on [email or web address]. If 7PM-7AM, please contact night-coverage at [email or web address]     10/30/2022, 10:42 AM

## 2022-10-31 DIAGNOSIS — I639 Cerebral infarction, unspecified: Secondary | ICD-10-CM | POA: Diagnosis not present

## 2022-10-31 LAB — GLUCOSE, CAPILLARY
Glucose-Capillary: 64 mg/dL — ABNORMAL LOW (ref 70–99)
Glucose-Capillary: 99 mg/dL (ref 70–99)
Glucose-Capillary: 80 mg/dL (ref 70–99)
Glucose-Capillary: 92 mg/dL (ref 70–99)
Glucose-Capillary: 73 mg/dL (ref 70–99)

## 2022-10-31 NOTE — Progress Notes (Signed)
Progress Note    [name] [name]  ZOX:[id number] DOB: 1941/03/31  DOA: 10/26/2022 PCP: Luciana Axe, NP      Brief Narrative:    Medical records reviewed and are as summarized below:  [name] [name] is a 82 y.o. male  with medical history significant of HTN, CHF, T2DM, HLD, pancreatitis, atrial fibrillation on Xarelto, CKD Stage 2, who was brought to the hospital because of altered mental status.  According to his wife, patient had become more lethargic and weak.  It was noticed that patient was unable to grab a spoon.      Assessment/Plan:   Principal Problem:   Acute CVA (cerebrovascular accident) [facility]) Active Problems:   Atrial fibrillation (HCC)   Asthma   Type 2 diabetes mellitus with peripheral neuropathy (HCC)   Essential hypertension   Chronic diastolic CHF (congestive heart failure) (HCC)   Spinal stenosis in cervical region   Spondylosis, cervical, with myelopathy    Body mass index is 26.64 kg/m.   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  Follow-up with neurologist for further recommendations.  2D echo showed EF estimated at 65 to 70%, moderate LVH,, mild MR, mild to moderate TR. No hypercarbia on venous blood gas   Atrial fibrillation with slow ventricular response, bradycardia: Carvedilol on hold because of bradycardia.  He is on digoxin.  Xarelto has also been held because of embolic stroke.  Dr. 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 Katrinka Blazing, neurosurgeon, on 10/27/2022.  He is not a surgical candidate per neurosurgeon.   Hypokalemia: Improved   Chronic diastolic CHF: Compensated.  Continue Lasix   Hypertension: Continue lisinopril.   Type II DM, mild hypoglycemia: Metformin on hold.  Monitor glucose levels closely.   History of  asthma      Diet Order             Diet regular Room service appropriate? Yes with Assist; Fluid consistency: Thin  Diet effective now                            Consultants: Neurologist Palliative care Neurosurgeon  Procedures: None    Medications:    aspirin  300 mg Rectal Daily   Or   aspirin  325 mg Oral Daily   atorvastatin  40 mg Oral Daily   cyanocobalamin  1,000 mcg Oral Daily   digoxin  0.125 mg Oral Daily   enoxaparin (LOVENOX) injection  40 mg Subcutaneous Q24H   furosemide  20 mg Oral Daily   lisinopril  15 mg Oral Daily   Continuous Infusions:   Anti-infectives (From admission, onward)    None              Family Communication/Anticipated D/C date and plan/Code Status   DVT prophylaxis: enoxaparin (LOVENOX) injection 40 mg Start: 10/26/22 2000     Code Status: Full Code  Family Communication: None Disposition Plan: Plan to discharge to SNF   Status is: Inpatient Remains inpatient appropriate because: acute stroke       Subjective:   Interval events noted.  He was sleeping when I walked into his room.  He was arousable but he did not provide any history.  Denies any complaints.  Objective:    Vitals:   10/31/22 0427 10/31/22 0514 10/31/22  4098 10/31/22 0841  BP: (!) 198/125 (!) 146/78 (!) 153/90   Pulse:  (!) 54 70 72  Resp:  16 18   Temp: 97.7 F (36.5 C)  97.6 F (36.4 C)   TempSrc: Oral  Oral   SpO2: 100% 100% 98%   Weight:      Height:       No data found.   Intake/Output Summary (Last 24 hours) at 10/31/2022 1343 Last data filed at 10/31/2022 0925 Gross per 24 hour  Intake 200 ml  Output 1300 ml  Net -1100 ml   Filed Weights   10/26/22 1119  Weight: 89.1 kg    Exam:    GEN: NAD SKIN: Warm and dry EYES: No pallor or icterus ENT: MMM CV: RRR PULM: CTA B ABD: soft, ND, NT, +BS CNS: Drowsy but arousable.  Oriented to person.  Left-sided neglect which is nothing new. EXT:  Bilateral AKA.  No edema or tenderness.      Data Reviewed:   I have personally reviewed following labs and imaging studies:  Labs: Labs show the following:   Basic Metabolic Panel: Recent Labs  Lab 10/26/22 1136 10/27/22 0436 10/28/22 0604  NA 134* 136 137  K 3.1* 3.4* 3.6  CL 101 102 103  CO2 24 25 26   GLUCOSE 81 66* 73  BUN 6* 7* 10  CREATININE 0.88 0.87 0.92  CALCIUM 8.7* 8.7* 9.0  MG  --  1.7  --   PHOS  --  2.8  --    GFR Estimated Creatinine Clearance: 70.3 mL/min (by C-G formula based on SCr of 0.92 mg/dL). Liver Function Tests: Recent Labs  Lab 10/26/22 1136  AST 14*  ALT 8  ALKPHOS 67  BILITOT 1.3*  PROT 6.9  ALBUMIN 3.2*   No results for input(s): "LIPASE", "AMYLASE" in the last 168 hours. No results for input(s): "AMMONIA" in the last 168 hours. Coagulation profile No results for input(s): "INR", "PROTIME" in the last 168 hours.  CBC: Recent Labs  Lab 10/26/22 1136 10/27/22 0436  WBC 7.1 5.8  NEUTROABS 4.2 2.9  HGB 12.4* 11.2*  HCT 36.3* 32.4*  MCV 83.6 82.7  PLT 163 159   Cardiac Enzymes: No results for input(s): "CKTOTAL", "CKMB", "CKMBINDEX", "TROPONINI" in the last 168 hours. BNP (last 3 results) No results for input(s): "PROBNP" in the last 8760 hours. CBG: Recent Labs  Lab 10/30/22 1712 10/30/22 1956 10/31/22 0732 10/31/22 0838 10/31/22 1119  GLUCAP 107* 87 64* 99 80   D-Dimer: No results for input(s): "DDIMER" in the last 72 hours. Hgb A1c: No results for input(s): "HGBA1C" in the last 72 hours.  Lipid Profile: No results for input(s): "CHOL", "HDL", "LDLCALC", "TRIG", "CHOLHDL", "LDLDIRECT" in the last 72 hours.  Thyroid function studies: No results for input(s): "TSH", "T4TOTAL", "T3FREE", "THYROIDAB" in the last 72 hours.  Invalid input(s): "FREET3" Anemia work up: No results for input(s): "VITAMINB12", "FOLATE", "FERRITIN", "TIBC", "IRON", "RETICCTPCT" in the last 72 hours. Sepsis Labs: Recent Labs  Lab  10/26/22 1130 10/26/22 1136 10/26/22 1309 10/27/22 0436  WBC  --  7.1  --  5.8  LATICACIDVEN 1.3  --  1.4  --     Microbiology No results found for this or any previous visit (from the past 240 hour(s)).  Procedures and diagnostic studies:  No results found.             LOS: 5 days   Mamie Diiorio  [facility]   Pager on [email or web address]. If  7PM-7AM, please contact night-coverage at [email or web address]     10/31/2022, 1:43 PM

## 2022-10-31 NOTE — Progress Notes (Signed)
Physical Therapy Treatment Patient Details Name: [name] [name] MRN: [id number] DOB: [birth year] Today's Date: 10/31/2022   History of Present Illness Pt is an 82 y.o. male presenting to hospital 10/26/22 with generalized fatigue and malaise past 24-48 hours.  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  Pt admitted with R MCA embolic appearing stroke, L UE edema, a-fib, and hypercarbia.  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    PT Comments  Pt resting in bed upon PT arrival; pt just finishing eating with assist from NT; pt's wife present.  Pt continues to keep his eyes closed a lot.  During session pt 2 assist with bed mobility and mod to max assist for sitting balance.  Pt briefly sat on edge of bed with assist (focusing on sitting balance) before pt initiated laying back down in bed.  After pt rested, pt assisted with sitting on edge of bed again but shortly after sitting up pt again started laying back down in bed (pt declining further activity so pt assisted back to bed).  Will continue to focus on strengthening, balance, and progressive functional mobility during hospitalization.    Assistance Recommended at Discharge Frequent or constant Supervision/Assistance  If plan is discharge home, recommend the following:  Can travel by private vehicle    Two people to help with walking and/or transfers;Two people to help with bathing/dressing/bathroom;Assistance with cooking/housework;Assistance with feeding;Direct supervision/assist for medications management;Assist for transportation;Help with stairs or ramp for entrance   No  Equipment Recommendations  Other (comment) (TBD at next facility)    Recommendations for Other Services       Precautions / Restrictions Precautions Precautions:  Fall;Cervical Precaution Comments: [place] cervical collar Required Braces or Orthoses: Cervical Brace Cervical Brace: Soft collar     Mobility  Bed Mobility Overal bed mobility: Needs Assistance Bed Mobility: Rolling, Supine to Sit, Sit to Supine Rolling: Mod assist, Max assist   Supine to sit: Max assist, +2 for safety/equipment Sit to supine: Max assist, +2 for safety/equipment   General bed mobility comments: assist for trunk; vc's for technique    Transfers Overall transfer level: Needs assistance                 General transfer comment: pt unable to sit upright long enough to attempt    Ambulation/Gait               General Gait Details: non-ambulatory   Stairs             Wheelchair Mobility     Tilt Bed    Modified Rankin (Stroke Patients Only)       Balance Overall balance assessment: Needs assistance Sitting-balance support: Bilateral upper extremity supported Sitting balance-Leahy Scale: Poor Sitting balance - Comments: pt leaning L/R/posterior intermittently requiring vc's and assist for upright positioning                                    Cognition Arousal/Alertness: Awake/alert Behavior During Therapy: Flat affect Overall Cognitive Status: Impaired/Different from baseline Area of Impairment: Attention, Following commands, Awareness, Problem solving                 Orientation Level:  (Oriented to person, place, month/year, and reports he was told he was at  hospital d/t having stroke but denies having a stroke) Current Attention Level: Focused   Following Commands: Follows one step commands inconsistently   Awareness: Emergent Problem Solving: Slow processing, Requires verbal cues, Requires tactile cues General Comments: Increased time to respond to questions or initiate movement for activities.        Exercises      General Comments  Nursing cleared pt for participation in physical  therapy.  Pt agreeable to PT session.      Pertinent Vitals/Pain Pain Assessment Pain Assessment: Faces Faces Pain Scale: No hurt Pain Intervention(s): Limited activity within patient's tolerance, Monitored during session Vitals (HR and SpO2 on room air) stable and WFL throughout treatment session.    Home Living                          Prior Function            PT Goals (current goals can now be found in the care plan section) Acute Rehab PT Goals Patient Stated Goal: to improve mobility PT Goal Formulation: With patient Time For Goal Achievement: 11/10/22 Potential to Achieve Goals: Fair Progress towards PT goals: Progressing toward goals    Frequency    Min 1X/week      PT Plan Current plan remains appropriate    Co-evaluation              AM-PAC PT "6 Clicks" Mobility   Outcome Measure  Help needed turning from your back to your side while in a flat bed without using bedrails?: A Lot Help needed moving from lying on your back to sitting on the side of a flat bed without using bedrails?: A Lot Help needed moving to and from a bed to a chair (including a wheelchair)?: Total Help needed standing up from a chair using your arms (e.g., wheelchair or bedside chair)?: Total Help needed to walk in hospital room?: Total Help needed climbing 3-5 steps with a railing? : Total 6 Click Score: 8    End of Session Equipment Utilized During Treatment: Cervical collar Activity Tolerance: Patient tolerated treatment well Patient left: in bed;with call bell/phone within reach;with bed alarm set;with family/visitor present Nurse Communication: Mobility status;Precautions PT Visit Diagnosis: Other abnormalities of gait and mobility (R26.89);Muscle weakness (generalized) (M62.81);Hemiplegia and hemiparesis Hemiplegia - Right/Left: Left Hemiplegia - caused by: Cerebral infarction     Time: 0865-7846 PT Time Calculation (min) (ACUTE ONLY): 23 min  Charges:     $Therapeutic Activity: 23-37 mins PT General Charges $$ ACUTE PT VISIT: 1 Visit                     Hendricks Limes, PT 10/31/22, 5:16 PM

## 2022-10-31 NOTE — Progress Notes (Signed)
[place] CBG 54. Gave patient orange juice.

## 2022-11-01 DIAGNOSIS — I639 Cerebral infarction, unspecified: Secondary | ICD-10-CM | POA: Diagnosis not present

## 2022-11-01 LAB — CBC
HCT: 42 % (ref 39.0–52.0)
Hemoglobin: 14.2 g/dL (ref 13.0–17.0)
MCH: 28.3 pg (ref 26.0–34.0)
MCHC: 33.8 g/dL (ref 30.0–36.0)
MCV: 83.7 fL (ref 80.0–100.0)
Platelets: 185 10*3/uL (ref 150–400)
RBC: 5.02 MIL/uL (ref 4.22–5.81)
RDW: 15 % (ref 11.5–15.5)
WBC: 6.6 10*3/uL (ref 4.0–10.5)
nRBC: 0 % (ref 0.0–0.2)

## 2022-11-01 LAB — BASIC METABOLIC PANEL
Anion gap: 11 (ref 5–15)
BUN: 12 mg/dL (ref 8–23)
CO2: 26 mmol/L (ref 22–32)
Calcium: 9.5 mg/dL (ref 8.9–10.3)
Chloride: 100 mmol/L (ref 98–111)
Creatinine, Ser: 1.01 mg/dL (ref 0.61–1.24)
GFR, Estimated: >60 mL/min (ref >60)
Glucose, Bld: 76 mg/dL (ref 70–99)
Potassium: 4 mmol/L (ref 3.5–5.1)
Sodium: 137 mmol/L (ref 135–145)

## 2022-11-01 MED ORDER — ASPIRIN 81 MG PO TBEC
81.0000 mg | DELAYED_RELEASE_TABLET | Freq: Every day | ORAL | Status: DC
  Administered 2022-11-02: 81 mg via ORAL
  Filled 2022-11-01: qty 1

## 2022-11-01 MED ORDER — RIVAROXABAN 20 MG PO TABS
20.0000 mg | ORAL_TABLET | Freq: Every day | ORAL | Status: DC
  Administered 2022-11-01: 20 mg via ORAL
  Filled 2022-11-01 (×2): qty 1

## 2022-11-01 NOTE — Progress Notes (Signed)
Physical Therapy Treatment Patient Details Name: [name] [name] MRN: [id number] DOB: June 15, 1940 Today's Date: 11/01/2022   History of Present Illness Pt is an 82 y.o. male presenting to hospital 10/26/22 with generalized fatigue and malaise past 24-48 hours.  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  Pt admitted with R MCA embolic appearing stroke, L UE edema, a-fib, and hypercarbia.  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    PT Comments  Patient is received in bed, eyes closed. Responds to voice/touch. Patient is alert during session, although will close eyes occasionally. Does not appear to follow direction well. Requires repeated cues and max multimodal cues for mobility. Patient required max-total +2 assist for bed mobility and sitting up on side of bed. He is unable to achieve sitting balance without max/total assist. Heavy posterior leaning and limited tolerance. Patient will continue to benefit from skilled PT to improve independence with mobility.       Assistance Recommended at Discharge Frequent or constant Supervision/Assistance  If plan is discharge home, recommend the following:  Can travel by private vehicle    Two people to help with walking and/or transfers;Two people to help with bathing/dressing/bathroom;Assistance with cooking/housework;Assistance with feeding;Direct supervision/assist for medications management;Assist for transportation;Help with stairs or ramp for entrance   No  Equipment Recommendations  Other (comment);None recommended by PT (TBD)    Recommendations for Other Services       Precautions / Restrictions Precautions Precautions: Fall;Cervical Precaution Comments: [place] cervical collar Cervical Brace: Soft collar Restrictions Weight Bearing Restrictions:  No RLE Weight Bearing: Non weight bearing LLE Weight Bearing: Non weight bearing     Mobility  Bed Mobility Overal bed mobility: Needs Assistance Bed Mobility: Supine to Sit, Sit to Supine     Supine to sit: Max assist, +2 for physical assistance, Total assist Sit to supine: Max assist, +2 for physical assistance, Total assist   General bed mobility comments: heavy +2 max/total A for sitting edge of bed.    Transfers                   General transfer comment: POOR sitting balance requiring Max assist +2 and unable to achieve/maintain even with that.    Ambulation/Gait                   Stairs             Wheelchair Mobility     Tilt Bed    Modified Rankin (Stroke Patients Only)       Balance Overall balance assessment: Needs assistance Sitting-balance support: Bilateral upper extremity supported Sitting balance-Leahy Scale: Zero Sitting balance - Comments: POOR sitting balance. Required max +2 to maintain. Heavy posterior lean Postural control: Posterior lean                                  Cognition Arousal/Alertness: Awake/alert Behavior During Therapy: Flat affect Overall Cognitive Status: Impaired/Different from baseline Area of Impairment: Attention, Following commands, Safety/judgement, Awareness, Problem solving                 Orientation Level: Disoriented to, Place, Time, Situation Current Attention Level: Sustained   Following Commands: Follows one step commands inconsistently, Follows one step commands with increased time Safety/Judgement: Decreased awareness of  safety, Decreased awareness of deficits Awareness: Intellectual Problem Solving: Slow processing, Requires verbal cues, Requires tactile cues, Decreased initiation General Comments: Increased time to respond to questions or initiate movement for activities.        Exercises      General Comments        Pertinent Vitals/Pain Pain  Assessment Pain Assessment: No/denies pain Faces Pain Scale: No hurt Breathing: normal Negative Vocalization: none Facial Expression: smiling or inexpressive Body Language: relaxed Consolability: no need to console PAINAD Score: 0    Home Living                          Prior Function            PT Goals (current goals can now be found in the care plan section) Acute Rehab PT Goals Patient Stated Goal: none stated PT Goal Formulation: Patient unable to participate in goal setting Time For Goal Achievement: 11/10/22 Potential to Achieve Goals: Fair Progress towards PT goals: Not progressing toward goals - comment (cognition limiting ability)    Frequency    Min 1X/week      PT Plan Current plan remains appropriate    Co-evaluation PT/OT/SLP Co-Evaluation/Treatment: Yes Reason for Co-Treatment: For patient/therapist safety;To address functional/ADL transfers PT goals addressed during session: Mobility/safety with mobility;Balance        AM-PAC PT "6 Clicks" Mobility   Outcome Measure  Help needed turning from your back to your side while in a flat bed without using bedrails?: Total Help needed moving from lying on your back to sitting on the side of a flat bed without using bedrails?: Total Help needed moving to and from a bed to a chair (including a wheelchair)?: Total Help needed standing up from a chair using your arms (e.g., wheelchair or bedside chair)?: Total Help needed to walk in hospital room?: Total Help needed climbing 3-5 steps with a railing? : Total 6 Click Score: 6    End of Session Equipment Utilized During Treatment: Cervical collar Activity Tolerance: Patient limited by lethargy;Other (comment) (cognition) Patient left: in bed;with call bell/phone within reach;with bed alarm set Nurse Communication: Mobility status PT Visit Diagnosis: Other abnormalities of gait and mobility (R26.89);Muscle weakness (generalized)  (M62.81) Hemiplegia - caused by: Cerebral infarction     Time: 1610-9604 PT Time Calculation (min) (ACUTE ONLY): 16 min  Charges:    $Therapeutic Activity: 8-22 mins PT General Charges $$ ACUTE PT VISIT: 1 Visit                     Agam Davenport, PT, GCS 11/01/22,2:11 PM

## 2022-11-01 NOTE — Progress Notes (Signed)
PROGRESS NOTE    [name] [name]  ZOX:[id number] DOB: [birth year] DOA: 10/26/2022 PCP: Luciana Axe, NP   Assessment & Plan:   Principal Problem:   Acute CVA (cerebrovascular accident) [facility]) Active Problems:   Atrial fibrillation (HCC)   Asthma   Type 2 diabetes mellitus with peripheral neuropathy (HCC)   Essential hypertension   Chronic diastolic CHF (congestive heart failure) (HCC)   Spinal stenosis in cervical region   Spondylosis, cervical, with myelopathy  Assessment and Plan: Right MCA embolic stroke: continue on aspirin, statin Echo showed EF 65 -70%, moderate LVH,, mild MR, mild to moderate TR.   A.fib: with bradycardia. Continue on digoxin. Holding coreg. Will restart xarelto on 7/10 as per neuro.    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 Not a surgical candidate per neurosurgeon on 10/27/22.   Hypokalemia: WNL today    Chronic diastolic CHF: compensated. Continue on home dose of lasix. Monitor I/os    HTN: continue on lisinopril    DM2: well controlled, HbA1c 5.1.           DVT prophylaxis: xarelto  Code Status: full  Family Communication: Disposition Plan: PT/OT recs SNF   Level of care: Telemetry Medical Status is: Inpatient Remains inpatient appropriate because: needs SNF placement     Consultants:  Neuro  Neuro surg Palliative care  Procedures:   Antimicrobials:  Subjective: Pt c/o malaise   Objective: Vitals:   10/31/22 1230 10/31/22 1622 10/31/22 2016 11/01/22 0543  BP: (!) 148/80 (!) 143/78 (!) 150/61 (!) 155/89  Pulse: 68 66 (!) 56 63  Resp:  16 16 18   Temp:  98 F (36.7 C) 97.6 F (36.4 C) 97.7 F (36.5 C)  TempSrc:  Oral  Oral  SpO2:  92% 98% 100%  Weight:      Height:        Intake/Output Summary (Last 24 hours) at 11/01/2022 0733 Last data filed at 10/31/2022 2016 Gross per 24 hour  Intake 400 ml  Output 1100 ml  Net -700 ml   Filed Weights   10/26/22 1119  Weight: 89.1 kg    Examination:  General exam:  Appears calm and comfortable  Respiratory system: Clear to auscultation. Respiratory effort normal. Cardiovascular system: S1 & S2+. No rubs, gallops or clicks Gastrointestinal system: Abdomen is nondistended, soft and nontender.  Normal bowel sounds heard. Central nervous system: Alert and awake Psychiatry: Judgement and insight appears at baseline. Flat mood and affect    Data Reviewed: I have personally reviewed following labs and imaging studies  CBC: Recent Labs  Lab 10/26/22 1136 10/27/22 0436  WBC 7.1 5.8  NEUTROABS 4.2 2.9  HGB 12.4* 11.2*  HCT 36.3* 32.4*  MCV 83.6 82.7  PLT 163 159   Basic Metabolic Panel: Recent Labs  Lab 10/26/22 1136 10/27/22 0436 10/28/22 0604  NA 134* 136 137  K 3.1* 3.4* 3.6  CL 101 102 103  CO2 24 25 26   GLUCOSE 81 66* 73  BUN 6* 7* 10  CREATININE 0.88 0.87 0.92  CALCIUM 8.7* 8.7* 9.0  MG  --  1.7  --   PHOS  --  2.8  --    GFR: Estimated Creatinine Clearance: 70.3 mL/min (by C-G formula based on SCr of 0.92 mg/dL). Liver Function Tests: Recent Labs  Lab 10/26/22 1136  AST 14*  ALT 8  ALKPHOS 67  BILITOT 1.3*  PROT 6.9  ALBUMIN 3.2*   No results for input(s): "LIPASE", "AMYLASE" in the last 168 hours. No results  for input(s): "AMMONIA" in the last 168 hours. Coagulation Profile: No results for input(s): "INR", "PROTIME" in the last 168 hours. Cardiac Enzymes: No results for input(s): "CKTOTAL", "CKMB", "CKMBINDEX", "TROPONINI" in the last 168 hours. BNP (last 3 results) No results for input(s): "PROBNP" in the last 8760 hours. HbA1C: No results for input(s): "HGBA1C" in the last 72 hours. CBG: Recent Labs  Lab 10/31/22 0732 10/31/22 0838 10/31/22 1119 10/31/22 1619 10/31/22 2026  GLUCAP 64* 99 80 92 73   Lipid Profile: No results for input(s): "CHOL", "HDL", "LDLCALC", "TRIG", "CHOLHDL", "LDLDIRECT" in the last 72 hours. Thyroid Function Tests: No results for input(s): "TSH", "T4TOTAL", "FREET4", "T3FREE",  "THYROIDAB" in the last 72 hours. Anemia Panel: No results for input(s): "VITAMINB12", "FOLATE", "FERRITIN", "TIBC", "IRON", "RETICCTPCT" in the last 72 hours. Sepsis Labs: Recent Labs  Lab 10/26/22 1130 10/26/22 1309  LATICACIDVEN 1.3 1.4    No results found for this or any previous visit (from the past 240 hour(s)).       Radiology Studies: No results found.      Scheduled Meds:  aspirin  300 mg Rectal Daily   Or   aspirin  325 mg Oral Daily   atorvastatin  40 mg Oral Daily   cyanocobalamin  1,000 mcg Oral Daily   digoxin  0.125 mg Oral Daily   enoxaparin (LOVENOX) injection  40 mg Subcutaneous Q24H   furosemide  20 mg Oral Daily   lisinopril  15 mg Oral Daily   Continuous Infusions:   LOS: 6 days    Time spent: 25 mins     Charise Killian, MD [facility] Pager [phone]  If 7PM-7AM, please contact night-coverage [email or web address] 11/01/2022, 7:33 AM

## 2022-11-01 NOTE — Progress Notes (Signed)
Occupational Therapy Treatment Patient Details Name: [name] [name] MRN: [id number] DOB: 1941-01-01 Today's Date: 11/01/2022   History of present illness Pt is an 82 y.o. male presenting to hospital 10/26/22 with generalized fatigue and malaise past 24-48 hours.  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  Pt admitted with R MCA embolic appearing stroke, L UE edema, a-fib, and hypercarbia.  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   OT comments  Pt received in bed, lopsided and eyes closed. Awakens to voice, and is oriented to self, situation and hospital. Pt alert, but closes eyes occasionally with limited interaction to environment, often staring into space. Inconsistent response to simple, 1-step commands. Cervical collar donned for mobility. MAX-TOTAL A +2 assist for bed mobility and attempt to sit EOB. Even with MAX - TOTAL +2 A, pt unable to achieve sitting balance, heavy posterior lean with minimal movement initiation. Poor to no trunk control overall. Pt does attempt to perform bed mobility (baseline uses rope to scoot towards HOB) with BUE. Global weakness and limited tolerance impacts performance of ADLs. OT will continue to address functional deficits as indicated to improve independence.    Recommendations for follow up therapy are one component of a multi-disciplinary discharge planning process, led by the attending physician.  Recommendations may be updated based on patient status, additional functional criteria and insurance authorization.    Assistance Recommended at Discharge Frequent or constant Supervision/Assistance  Patient can return home with the following  Two people to help with walking and/or transfers;Two people to help with bathing/dressing/bathroom;Assistance with feeding;Direct  supervision/assist for medications management;Assist for transportation;Direct supervision/assist for financial management;Help with stairs or ramp for entrance;Assistance with cooking/housework         Precautions / Restrictions Precautions Precautions: Fall;Cervical Precaution Comments: [place] cervical collar Cervical Brace: Soft collar Restrictions Weight Bearing Restrictions: No RLE Weight Bearing: Non weight bearing LLE Weight Bearing: Non weight bearing       Mobility Bed Mobility Overal bed mobility: Needs Assistance Bed Mobility: Supine to Sit, Sit to Supine Rolling: Mod assist, Max assist   Supine to sit: Max assist, +2 for physical assistance, Total assist Sit to supine: Max assist, +2 for physical assistance, Total assist   General bed mobility comments: heavy +2 max/total A for sitting edge of bed. (Posterior lean once upright, pt with limited motor initiation)    Transfers                   General transfer comment: Attempted to sit EOB with +2, very poor sitting balance and overall tolerance, MAX A - TOTAL A throughout     Balance Overall balance assessment: Needs assistance Sitting-balance support: Bilateral upper extremity supported Sitting balance-Leahy Scale: Zero Sitting balance - Comments: POOR sitting balance. Required max +2 to maintain. Heavy posterior lean Postural control: Posterior lean                                 ADL either performed or assessed with clinical judgement   ADL Overall ADL's : Needs assistance/impaired                           Toilet Transfer Details (indicate cue type and reason): Simulated during bed mobility  Functional mobility during ADLs: +2 for physical assistance;Maximal assistance;Total assistance General ADL Comments: TOTAL A to don cervical collar, overall +2 max - total for all mobility and ADLS    Extremity/Trunk Assessment Upper Extremity Assessment Upper  Extremity Assessment: Generalized weakness (Able to perform [facility] of shld flexion (reaching OH to attempt to grab bar to scoot upwards))             Cognition Arousal/Alertness: Awake/alert Behavior During Therapy: Flat affect Overall Cognitive Status: Impaired/Different from baseline Area of Impairment: Attention, Following commands, Safety/judgement, Awareness, Problem solving                 Orientation Level: Disoriented to, Place, Time, Situation Current Attention Level: Sustained   Following Commands: Follows one step commands inconsistently, Follows one step commands with increased time Safety/Judgement: Decreased awareness of safety, Decreased awareness of deficits Awareness: Intellectual Problem Solving: Slow processing, Requires verbal cues, Requires tactile cues, Decreased initiation General Comments: Increased time to respond to questions or initiate movement for activities.                   Pertinent Vitals/ Pain       Pain Assessment Pain Assessment: No/denies pain   Frequency  Min 1X/week        Progress Toward Goals  OT Goals(current goals can now be found in the care plan section)  Progress towards OT goals: Progressing toward goals  Acute Rehab OT Goals OT Goal Formulation: Patient unable to participate in goal setting Time For Goal Achievement: 11/10/22 Potential to Achieve Goals: Poor ADL Goals Pt Will Perform Eating: with set-up;sitting Pt Will Transfer to Toilet: with mod assist;bedside commode Pt Will Perform Toileting - Clothing Manipulation and hygiene: with mod assist;sitting/lateral leans  Plan Discharge plan remains appropriate;Other (comment) (may need to consider LTC if pt does not progress)    Co-evaluation    PT/OT/SLP Co-Evaluation/Treatment: Yes Reason for Co-Treatment: For patient/therapist safety;To address functional/ADL transfers PT goals addressed during session: Mobility/safety with mobility;Balance OT goals  addressed during session: ADL's and self-care      AM-PAC OT "6 Clicks" Daily Activity     Outcome Measure   Help from another person eating meals?: A Lot Help from another person taking care of personal grooming?: Total Help from another person toileting, which includes using toliet, bedpan, or urinal?: Total Help from another person bathing (including washing, rinsing, drying)?: Total Help from another person to put on and taking off regular upper body clothing?: Total Help from another person to put on and taking off regular lower body clothing?: Total 6 Click Score: 7    End of Session Equipment Utilized During Treatment: Cervical collar  OT Visit Diagnosis: Muscle weakness (generalized) (M62.81);Feeding difficulties (R63.3)   Activity Tolerance Patient limited by lethargy   Patient Left in bed;with bed alarm set;with call bell/phone within reach   Nurse Communication          Time: 1610-9604 OT Time Calculation (min): 16 min  Charges: OT General Charges $OT Visit: 1 Visit OT Treatments $Self Care/Home Management : 8-22 mins  Cashus Halterman L. Farin Buhman, OTR/L  11/01/22, 3:14 PM

## 2022-11-02 DIAGNOSIS — I739 Peripheral vascular disease, unspecified: Secondary | ICD-10-CM | POA: Diagnosis not present

## 2022-11-02 DIAGNOSIS — I5032 Chronic diastolic (congestive) heart failure: Secondary | ICD-10-CM | POA: Diagnosis not present

## 2022-11-02 DIAGNOSIS — I639 Cerebral infarction, unspecified: Secondary | ICD-10-CM | POA: Diagnosis not present

## 2022-11-02 DIAGNOSIS — Z89612 Acquired absence of left leg above knee: Secondary | ICD-10-CM | POA: Diagnosis not present

## 2022-11-02 DIAGNOSIS — M4712 Other spondylosis with myelopathy, cervical region: Secondary | ICD-10-CM | POA: Diagnosis not present

## 2022-11-02 DIAGNOSIS — K861 Other chronic pancreatitis: Secondary | ICD-10-CM | POA: Diagnosis not present

## 2022-11-02 DIAGNOSIS — R5381 Other malaise: Secondary | ICD-10-CM | POA: Diagnosis not present

## 2022-11-02 DIAGNOSIS — Z743 Need for continuous supervision: Secondary | ICD-10-CM | POA: Diagnosis not present

## 2022-11-02 DIAGNOSIS — N182 Chronic kidney disease, stage 2 (mild): Secondary | ICD-10-CM | POA: Diagnosis not present

## 2022-11-02 DIAGNOSIS — F4323 Adjustment disorder with mixed anxiety and depressed mood: Secondary | ICD-10-CM | POA: Diagnosis not present

## 2022-11-02 DIAGNOSIS — M869 Osteomyelitis, unspecified: Secondary | ICD-10-CM | POA: Diagnosis not present

## 2022-11-02 DIAGNOSIS — Z4781 Encounter for orthopedic aftercare following surgical amputation: Secondary | ICD-10-CM | POA: Diagnosis not present

## 2022-11-02 DIAGNOSIS — E119 Type 2 diabetes mellitus without complications: Secondary | ICD-10-CM | POA: Diagnosis not present

## 2022-11-02 DIAGNOSIS — G9340 Encephalopathy, unspecified: Secondary | ICD-10-CM | POA: Diagnosis not present

## 2022-11-02 DIAGNOSIS — M4802 Spinal stenosis, cervical region: Secondary | ICD-10-CM | POA: Diagnosis not present

## 2022-11-02 DIAGNOSIS — I517 Cardiomegaly: Secondary | ICD-10-CM | POA: Diagnosis not present

## 2022-11-02 DIAGNOSIS — I10 Essential (primary) hypertension: Secondary | ICD-10-CM | POA: Diagnosis not present

## 2022-11-02 DIAGNOSIS — I63411 Cerebral infarction due to embolism of right middle cerebral artery: Secondary | ICD-10-CM | POA: Diagnosis not present

## 2022-11-02 DIAGNOSIS — I480 Paroxysmal atrial fibrillation: Secondary | ICD-10-CM | POA: Diagnosis not present

## 2022-11-02 LAB — GLUCOSE, CAPILLARY
Glucose-Capillary: 64 mg/dL — ABNORMAL LOW (ref 70–99)
Glucose-Capillary: 212 mg/dL — ABNORMAL HIGH (ref 70–99)

## 2022-11-02 MED ORDER — ORAL CARE MOUTH RINSE: 15.0000 mL | OROMUCOSAL | Status: DC | PRN

## 2022-11-02 MED ORDER — CARVEDILOL 6.25 MG PO TABS: 6.2500 mg | ORAL_TABLET | Freq: Two times a day (BID) | ORAL | Status: DC

## 2022-11-02 MED ORDER — ORAL CARE MOUTH RINSE
15.0000 mL | OROMUCOSAL | Status: DC
  Administered 2022-11-02: 15 mL via OROMUCOSAL

## 2022-11-02 NOTE — Plan of Care (Signed)
Problem: Education: Goal: Knowledge of disease or condition will improve 11/02/2022 0623 by Barrie Folk, RN Outcome: Progressing 11/02/2022 0449 by Barrie Folk, RN Outcome: Progressing Goal: Knowledge of secondary prevention will improve (MUST DOCUMENT ALL) 11/02/2022 0623 by Barrie Folk, RN Outcome: Progressing 11/02/2022 0449 by Barrie Folk, RN Outcome: Progressing Goal: Knowledge of patient specific risk factors will improve [name] [name] N/A or DELETE if not current risk factor) 11/02/2022 0623 by Barrie Folk, RN Outcome: Progressing 11/02/2022 0449 by Barrie Folk, RN Outcome: Progressing   Problem: Ischemic Stroke/TIA Tissue Perfusion: Goal: Complications of ischemic stroke/TIA will be minimized 11/02/2022 0623 by Barrie Folk, RN Outcome: Progressing 11/02/2022 0449 by Barrie Folk, RN Outcome: Progressing   Problem: Coping: Goal: Will verbalize positive feelings about self 11/02/2022 0623 by Barrie Folk, RN Outcome: Progressing 11/02/2022 0449 by Barrie Folk, RN Outcome: Progressing Goal: Will identify appropriate support needs 11/02/2022 0623 by Barrie Folk, RN Outcome: Progressing 11/02/2022 0449 by Barrie Folk, RN Outcome: Progressing   Problem: Health Behavior/Discharge Planning: Goal: Ability to manage health-related needs will improve 11/02/2022 0623 by Barrie Folk, RN Outcome: Progressing 11/02/2022 0449 by Barrie Folk, RN Outcome: Progressing Goal: Goals will be collaboratively established with patient/family 11/02/2022 [phone] by Barrie Folk, RN Outcome: Progressing 11/02/2022 0449 by Barrie Folk, RN Outcome: Progressing   Problem: Nutrition: Goal: Risk of aspiration will decrease 11/02/2022 0623 by Barrie Folk, RN Outcome: Progressing 11/02/2022 0449 by Barrie Folk, RN Outcome: Progressing Goal:  Dietary intake will improve 11/02/2022 0623 by Barrie Folk, RN Outcome: Progressing 11/02/2022 0449 by Barrie Folk, RN Outcome: Progressing   Problem: Education: Goal: Knowledge of General Education information will improve Description: Including pain rating scale, medication(s)/side effects and non-pharmacologic comfort measures 11/02/2022 0623 by Barrie Folk, RN Outcome: Progressing 11/02/2022 0449 by Barrie Folk, RN Outcome: Progressing   Problem: Clinical Measurements: Goal: Ability to maintain clinical measurements within normal limits will improve 11/02/2022 0623 by Barrie Folk, RN Outcome: Progressing 11/02/2022 0449 by Barrie Folk, RN Outcome: Progressing Goal: Will remain free from infection 11/02/2022 0623 by Barrie Folk, RN Outcome: Progressing 11/02/2022 0449 by Barrie Folk, RN Outcome: Progressing Goal: Diagnostic test results will improve 11/02/2022 0623 by Barrie Folk, RN Outcome: Progressing 11/02/2022 0449 by Barrie Folk, RN Outcome: Progressing Goal: Respiratory complications will improve 11/02/2022 0623 by Barrie Folk, RN Outcome: Progressing 11/02/2022 0449 by Barrie Folk, RN Outcome: Progressing Goal: Cardiovascular complication will be avoided 11/02/2022 0623 by Barrie Folk, RN Outcome: Progressing 11/02/2022 0449 by Barrie Folk, RN Outcome: Progressing   Problem: Activity: Goal: Risk for activity intolerance will decrease 11/02/2022 0623 by Barrie Folk, RN Outcome: Progressing 11/02/2022 0449 by Barrie Folk, RN Outcome: Progressing   Problem: Nutrition: Goal: Adequate nutrition will be maintained 11/02/2022 0623 by Barrie Folk, RN Outcome: Progressing 11/02/2022 0449 by Barrie Folk, RN Outcome: Progressing   Problem: Coping: Goal: Level of anxiety will decrease 11/02/2022 0623 by  Barrie Folk, RN Outcome: Progressing 11/02/2022 0449 by Barrie Folk, RN Outcome: Progressing   Problem: Elimination: Goal: Will not experience complications related to bowel motility 11/02/2022 0623 by Barrie Folk, RN Outcome: Progressing 11/02/2022 0449 by Barrie Folk, RN Outcome: Progressing Goal: Will not experience complications related to urinary retention 11/02/2022 0623 by Barrie Folk, RN Outcome: Progressing 11/02/2022 0449 by Barrie Folk, RN  Outcome: Progressing   Problem: Safety: Goal: Ability to remain free from injury will improve 11/02/2022 0623 by Barrie Folk, RN Outcome: Progressing 11/02/2022 0449 by Barrie Folk, RN Outcome: Progressing   Problem: Skin Integrity: Goal: Risk for impaired skin integrity will decrease 11/02/2022 0623 by Barrie Folk, RN Outcome: Progressing 11/02/2022 0449 by Barrie Folk, RN Outcome: Progressing

## 2022-11-02 NOTE — TOC Progression Note (Signed)
Transition of Care [facility]) - Progression Note    Patient Details  Name: [name] [name] MRN: [id number] Date of Birth: 1941/01/16  [facility] CM/SW Contact  [name], RN Phone Number: 11/02/2022, 9:30 AM  Clinical Narrative:   Authorization pending for SNF placement.  SNF authorization pending ID number [id number].  TOC will continue to follow for discharge planning.      Expected Discharge Plan: Skilled Nursing Facility Barriers to Discharge: Continued Medical Work up  Expected Discharge Plan and Services     Post Acute Care Choice: Skilled Nursing Facility Living arrangements for the past 2 months: Single Family Home                                       Social Determinants of Health (SDOH) Interventions SDOH Screenings   Food Insecurity: No Food Insecurity (10/26/2022)  Housing: Low Risk  (10/26/2022)  Transportation Needs: No Transportation Needs (10/26/2022)  Utilities: Not At Risk (10/26/2022)  Tobacco Use: High Risk (10/26/2022)    Readmission Risk Interventions     No data to display

## 2022-11-02 NOTE — Progress Notes (Signed)
Pt discharging to [facility] to Room 509A I called [phone] and gave report to Ixchel the oncoming nurse. Wife is aware of decision and transportation.

## 2022-11-02 NOTE — Care Management Important Message (Signed)
Important Message  Patient Details  Name: [name] [name] MRN: [id number] Date of Birth: 01-18-1941   Medicare Important Message Given:  Yes     [name] A Chuong Casebeer 11/02/2022, 11:08 AM

## 2022-11-02 NOTE — Plan of Care (Signed)

## 2022-11-02 NOTE — Discharge Summary (Signed)
Physician Discharge Summary  [name] [name]:[id number] DOB: 1941/02/08 DOA: 10/26/2022  PCP: Luciana Axe, NP  Admit date: 10/26/2022 Discharge date: 11/02/2022  Admitted From: home  Disposition:  SNF  Recommendations for Outpatient Follow-up:  Follow up with PCP in 1-2 weeks F/u w/ neuro, [name] [name], in 4-6 weeks  F/u w/ cardio in 1-2 weeks   Home Health: no  Equipment/Devices:  Discharge Condition: stable CODE STATUS: full  Diet recommendation: Heart Healthy   Brief/Interim Summary: HPI was taken from [name] [name]: [name] [name] is a 82 y.o. male with medical history significant of HTN, CHF, T2DM, HLD, pancreatitis, atrial fibrillation on Xarelto, CKD Stage 2, who presents to the ED due to altered mental status.     History obtained from patient's wife at bedside due to patient's altered mental status.  [name] [name] states patient was in his baseline state of health on the evening of 7/2 and woke up on 7/3 with altered mental status and lethargy.  She noted that he was unable to grab a spoon and seemed to be over or under reaching when trying to grab it.  In addition, he seemed weak all over.  Due to persistence of symptoms, she brought him to the ER.  No recent illnesses reported.   ED Course:  On arrival to the ED, patient was hypertensive at 154/103 with heart rate 77.  He was saturating at 100% room air.  He was afebrile at 97.8.  Initial workup notable for hemoglobin of 12.4, potassium 3.1, BUN 6, creatinine 0.88 with GFR above 60.  VBG with pH of 7.37 with pCO2 of 55.  Troponin negative at 12.  Lactic acid 1.3. Chest x-ray was obtained with no active disease.  CT of the head was obtained with evidence of a subacute infarct involving the right parietal lobe and right basal ganglia with asymmetric density within the right MCA concerning for thrombus.  Neurology consulted.  [facility] contacted for admission.  Discharge Diagnoses:  Principal Problem:   Acute CVA (cerebrovascular accident)  [facility]) Active Problems:   Atrial fibrillation (HCC)   Asthma   Type 2 diabetes mellitus with peripheral neuropathy (HCC)   Essential hypertension   Chronic diastolic CHF (congestive heart failure) (HCC)   Spinal stenosis in cervical region   Spondylosis, cervical, with myelopathy  Right MCA embolic stroke: continue on aspirin, statin. Echo showed EF 65 -70%, moderate LVH,, mild MR, mild to moderate TR. Restarted home dose of xarelto    A.fib: with bradycardia. Continue on digoxin. Holding coreg secondary to bradycardia. 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 Not a surgical candidate per neurosurgeon on 10/27/22.   Hypokalemia: WNL today    Chronic diastolic CHF: compensated. Continue on home dose of lasix. Monitor I/Os    HTN: continue on lisinopril    DM2: well controlled, HbA1c 5.1.   Hx of b/l AKA: continue w/ supportive care   Discharge Instructions  Discharge Instructions     Diet - low sodium heart healthy   Complete by: As directed    Discharge instructions   Complete by: As directed    F/u w/ PCP in 1-2 weeks. F/u w/ neuro, [name] [name], in 4-6 weeks.   Increase activity slowly   Complete by: As directed       Allergies as of 11/02/2022   No Known Allergies      Medication List     TAKE these medications    ascorbic acid 500 MG  tablet Commonly known as: VITAMIN C Take 500 mg by mouth daily.   aspirin EC 81 MG tablet Take 1 tablet (81 mg total) by mouth daily. Swallow whole.   atorvastatin 10 MG tablet Commonly known as: Lipitor Take 1 tablet (10 mg total) by mouth daily.   carvedilol 6.25 MG tablet Commonly known as: COREG Take 1 tablet (6.25 mg total) by mouth 2 (two) times daily. HOLD THIS MEDICATION UNTIL PT SEES CARDIO SECONDARY TO BRADYCARDIA What changed: additional instructions   cyanocobalamin 1000 MCG tablet Commonly known as: VITAMIN B12 Take 1,000 mcg by mouth daily.   digoxin 0.125 MG  tablet Commonly known as: LANOXIN Take 0.125 mcg daily by mouth.   ferrous sulfate 325 (65 FE) MG tablet Take by mouth.   furosemide 20 MG tablet Commonly known as: LASIX Take 20 mg daily by mouth.   gabapentin 100 MG capsule Commonly known as: NEURONTIN Take 1 capsule (100 mg total) by mouth 3 (three) times daily.   lisinopril 10 MG tablet Commonly known as: ZESTRIL Take 15 mg by mouth daily.   metFORMIN 1000 MG tablet Commonly known as: GLUCOPHAGE Take 1,000 mg by mouth daily.   rivaroxaban 20 MG Tabs tablet Commonly known as: XARELTO Take 20 mg daily by mouth.        No Known Allergies  Consultations: Neuro  Neuro surg    Procedures/Studies: MR CERVICAL SPINE WO CONTRAST  Result Date: 10/27/2022 CLINICAL DATA:  Myelopathy, acute, cervical spine. EXAM: MRI CERVICAL SPINE WITHOUT CONTRAST TECHNIQUE: Multiplanar, multisequence MR imaging of the cervical spine was performed. No intravenous contrast was administered. COMPARISON:  None Available. FINDINGS: The study is partially degraded by motion. Alignment: 3 mm anterolisthesis of C3 over C4. Trace anterolisthesis at C5-6 and C6-7. Vertebrae: No fracture, evidence of discitis, or bone lesion. Cord: Prominent mass effect on the cord at C3-4 with associated T2 hyperintensity. Posterior Fossa, vertebral arteries, paraspinal tissues: Thickening of the retro odontoid soft tissues. Disc levels: C2-3: Posterior disc osteophyte complex and ligamentum flavum thickening resulting in mild spinal canal stenosis. Uncovertebral and facet degenerative changes resulting in high-grade bilateral neural foraminal narrowing. C3-4: Anterolisthesis, posterior disc osteophyte complex and thickening of the ligamentum flavum resulting in severe spinal canal stenosis with severe mass effect on the cord. Uncovertebral and facet degenerative changes resulting in high-grade bilateral neural foraminal narrowing. C4-5: Small posterior disc osteophyte complex  without significant spinal canal stenosis. Uncovertebral and facet degenerative changes resulting in high-grade bilateral neural foraminal narrowing. C5-6: Posterior disc osteophyte complex resulting in mild spinal canal stenosis. Uncovertebral and facet degenerative changes resulting in mild right and high-grade left neural foraminal narrowing. C6-7: Small posterior disc osteophyte complex without significant spinal canal stenosis. Uncovertebral and facet degenerative changes resulting in mild right and high-grade left neural foraminal narrowing. C7-T1: Posterior disc osteophyte complex resulting in mild spinal canal stenosis. Uncovertebral and facet degenerative changes resulting high-grade bilateral neural foraminal narrowing. IMPRESSION: 1. Advanced degenerative changes of the cervical spine, more pronounced at C3-4 where there is severe spinal canal stenosis with severe mass effect on the cord and associated cord signal abnormality. 2. Multilevel high-grade neural foraminal narrowing, as described above. Electronically Signed   By: [name] [name] M.D.   On: 10/27/2022 16:37   ECHOCARDIOGRAM COMPLETE  Result Date: 10/27/2022    ECHOCARDIOGRAM REPORT   Patient Name:   [facility] Date of Exam: 10/26/2022 Medical Rec #:  [id number]    Height:       72.0 in  Accession #:    [id number]   Weight:       196.4 lb Date of Birth:  1940-07-15    BSA:          2.114 m Patient Age:    81 years     BP:           164/75 mmHg Patient Gender: M            HR:           56 bpm. Exam Location:  [facility] Procedure: 2D Echo Indications:     Stroke I63.9  History:         Patient has no prior history of Echocardiogram examinations.  Sonographer:     [name] [name] RDCS, FASE Referring Phys:  [id number] [name] [name] Diagnosing Phys: [name] Millard MD IMPRESSIONS  1. Left ventricular ejection fraction, by estimation, is 65 to 70%. The left ventricle has normal function. The left ventricle has no regional wall motion  abnormalities. There is moderate left ventricular hypertrophy. Indeterminate diastolic filling due  to E-A fusion.  2. Right ventricular systolic function is normal. The right ventricular size is normal.  3. The mitral valve is normal in structure. Mild mitral valve regurgitation. No evidence of mitral stenosis.  4. Tricuspid valve regurgitation is mild to moderate.  5. The aortic valve is normal in structure. Aortic valve regurgitation is not visualized. No aortic stenosis is present.  6. The inferior vena cava is normal in size with greater than 50% respiratory variability, suggesting right atrial pressure of 3 mmHg. FINDINGS  Left Ventricle: Left ventricular ejection fraction, by estimation, is 65 to 70%. The left ventricle has normal function. The left ventricle has no regional wall motion abnormalities. The left ventricular internal cavity size was normal in size. There is  moderate left ventricular hypertrophy. Indeterminate diastolic filling due to E-A fusion. Right Ventricle: The right ventricular size is normal. No increase in right ventricular wall thickness. Right ventricular systolic function is normal. Left Atrium: Left atrial size was normal in size. Right Atrium: Right atrial size was normal in size. Pericardium: There is no evidence of pericardial effusion. Mitral Valve: The mitral valve is normal in structure. Mild mitral valve regurgitation. No evidence of mitral valve stenosis. Tricuspid Valve: The tricuspid valve is normal in structure. Tricuspid valve regurgitation is mild to moderate. No evidence of tricuspid stenosis. Aortic Valve: The aortic valve is normal in structure. Aortic valve regurgitation is not visualized. No aortic stenosis is present. Aortic valve peak gradient measures 11.6 mmHg. Pulmonic Valve: The pulmonic valve was normal in structure. Pulmonic valve regurgitation is not visualized. No evidence of pulmonic stenosis. Aorta: The aortic root is normal in size and structure.  Venous: The inferior vena cava is normal in size with greater than 50% respiratory variability, suggesting right atrial pressure of 3 mmHg. IAS/Shunts: No atrial level shunt detected by color flow Doppler.  LEFT VENTRICLE PLAX 2D LVIDd:         3.70 cm   Diastology LVIDs:         2.20 cm   LV e' medial:    12.60 cm/s LV PW:         1.40 cm   LV E/e' medial:  6.0 LV IVS:        1.50 cm   LV e' lateral:   14.40 cm/s LVOT diam:     2.00 cm   LV E/e' lateral: 5.2 LV SV:  57 LV SV Index:   27 LVOT Area:     3.14 cm  RIGHT VENTRICLE RV Basal diam:  2.40 cm RV S prime:     12.40 cm/s TAPSE (M-mode): 1.7 cm LEFT ATRIUM           Index        RIGHT ATRIUM           Index LA diam:      3.20 cm 1.51 cm/m   RA Area:     13.30 cm LA Vol (A2C): 69.2 ml 32.73 ml/m  RA Volume:   30.00 ml  14.19 ml/m LA Vol (A4C): 46.0 ml 21.76 ml/m  AORTIC VALVE                 PULMONIC VALVE AV Area (Vmax): 1.68 cm     PV Vmax:       1.12 m/s AV Vmax:        170.00 cm/s  PV Peak grad:  5.0 mmHg AV Peak Grad:   11.6 mmHg LVOT Vmax:      91.00 cm/s LVOT Vmean:     58.400 cm/s LVOT VTI:       0.182 m  AORTA Ao Root diam: 3.40 cm Ao Asc diam:  3.30 cm MITRAL VALVE               TRICUSPID VALVE MV Area (PHT): 2.85 cm    TR Peak grad:   26.0 mmHg MV Decel Time: 266 msec    TR Vmax:        255.00 cm/s MV E velocity: 75.50 cm/s                            SHUNTS                            Systemic VTI:  0.18 m                            Systemic Diam: 2.00 cm [name] Millard MD Electronically signed by [name] Millard MD Signature Date/Time: 10/27/2022/2:30:37 PM    Final    MR BRAIN WO CONTRAST  Result Date: 10/26/2022 CLINICAL DATA:  Altered mental status, stroke follow-up. EXAM: MRI HEAD WITHOUT CONTRAST TECHNIQUE: Multiplanar, multiecho pulse sequences of the brain and surrounding structures were obtained without intravenous contrast. COMPARISON:  Same-day CT/CTA head and neck. FINDINGS: Brain: There is extensive diffusion  restriction in the right MCA distribution involving the caudate and lentiform nucleus, dorsal lateral thalamus, posterior aspect of the insula, and temporal and parietal cortex with associated FLAIR signal abnormality consistent with acute to early subacute infarct. The frontal cortex is spared. There is mild gyral swelling and sulcal effacement but no midline shift. There is no evidence of hemorrhagic transformation. Background parenchymal volume is within expected limits for age. The ventricles are normal in size. There are small remote infarcts in the bilateral cerebellar hemispheres, thalami, and right basal ganglia superimposed on mild background chronic small-vessel ischemic change The pituitary and suprasellar region are normal. There is no mass lesion. There is no mass effect or midline shift. Vascular: SWI signal dropout in the right sylvian fissure likely reflects clot in an M2 vessel. Skull and upper cervical spine: Normal marrow signal. Sinuses/Orbits: The paranasal sinuses are clear. Bilateral lens implants are in place. The globes and orbits are otherwise  unremarkable. Other: The mastoid air cells and middle ear cavities are clear. IMPRESSION: Extensive acute to early subacute infarct in the right MCA distribution as above without hemorrhagic transformation or mass effect. Electronically Signed   By: [name] [name] M.D.   On: 10/26/2022 18:17   CT ANGIO HEAD NECK W WO CM  Result Date: 10/26/2022 CLINICAL DATA:  Fatigue, altered mental status. EXAM: CT ANGIOGRAPHY HEAD AND NECK WITH AND WITHOUT CONTRAST TECHNIQUE: Multidetector CT imaging of the head and neck was performed using the standard protocol during bolus administration of intravenous contrast. Multiplanar CT image reconstructions and MIPs were obtained to evaluate the vascular anatomy. Carotid stenosis measurements (when applicable) are obtained utilizing NASCET criteria, using the distal internal carotid diameter as the denominator. RADIATION  DOSE REDUCTION: This exam was performed according to the departmental dose-optimization program which includes automated exposure control, adjustment of the mA and/or kV according to patient size and/or use of iterative reconstruction technique. CONTRAST:  75mL OMNIPAQUE IOHEXOL 350 MG/ML SOLN COMPARISON:  Same-day CT head FINDINGS: CTA NECK FINDINGS Aortic arch: There is mild calcified plaque in the imaged aortic arch. The origins of the major branch vessels are patent. The subclavian arteries are patent to the level imaged. Right carotid system: The right common, internal, and external carotid arteries are patent, without hemodynamically significant stenosis or occlusion there is no evidence of dissection or aneurysm. Left carotid system: The left common, internal, and external carotid arteries are patent, without hemodynamically significant stenosis or occlusion. There is no evidence of dissection or aneurysm. Vertebral arteries: The right vertebral artery is occluded throughout its course with a minimal reconstitution of flow in the V3 segment. The left vertebral artery is diminutive in caliber but patent throughout. There is moderate stenosis at its origin. Skeleton: There is no acute osseous abnormality or suspicious osseous lesion. There is no visible canal hematoma. There is probable severe spinal canal stenosis at C3-C4 with suspected cord compression. Other neck: Soft tissues of the neck are unremarkable. Upper chest: The imaged lung apices are clear. The esophagus is patulous and filled with debris. Review of the MIP images confirms the above findings CTA HEAD FINDINGS Anterior circulation: There is mild calcified plaque in the intracranial ICAs without significant stenosis or occlusion. The right M1 segment is patent. An inferior right M2 branch is occluded in the sylvian fissure (8-96), and a superior right M2/proximal M3 branch is occluded more distally (8-102). There is overall minimal collateral flow  in the distal right MCA distribution (11-24). The left M1 segment and distal branches are patent, without proximal high-grade stenosis or occlusion. The bilateral ACAS are patent, without proximal stenosis or occlusion. The anterior communicating artery is normal. There is no aneurysm or AVM. Posterior circulation: The right V4 segment is occluded. The left V4 segment is markedly diminutive but patent. The basilar artery is diminutive and severely stenotic proximally. PICA is identified bilaterally. The superior cerebellar arteries are identified bilaterally The bilateral PCAs are patent, supplied by prominent posterior communicating arteries bilaterally. There is no proximal stenosis or occlusion. There is no aneurysm or AVM. Venous sinuses: Not well assessed due to bolus timing. Anatomic variants: As above. Review of the MIP images confirms the above findings IMPRESSION: 1. 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 2. 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 The left vertebral artery is diminutive in caliber but patent throughout. 3. 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 The bilateral PCAs are patent, supplied by prominent posterior communicating arteries bilaterally. 4. Patent carotid systems without hemodynamically significant stenosis or occlusion. 5. 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 Consider MRI of the cervical spine for further evaluation. 6. Patulous and debris-filled esophagus. Correlate with any history of reflux. Electronically Signed   By: [name] [name] M.D.   On: 10/26/2022 15:13   CT HEAD WO CONTRAST ( )  Result Date: 10/26/2022 CLINICAL DATA:  Mental status change. EXAM: CT HEAD WITHOUT  CONTRAST TECHNIQUE: Contiguous axial images were obtained from the base of the skull through the vertex without intravenous contrast. RADIATION DOSE REDUCTION: This exam was performed according to the departmental dose-optimization program which includes automated exposure control, adjustment of the mA and/or kV according to patient size and/or use of iterative reconstruction technique. COMPARISON:  08/10/2022 FINDINGS: Brain: Signs subacute infarct is identified involving the right parietal lobe with cortical and subcortical low attenuation edema with effacement of the sulci, image 18/2. Subacute infarct with low-attenuation edema involving the right basal ganglia is also identified and appears new from the previous exam, image 16/2. No sign of acute intracranial hemorrhage. There is moderate diffuse low-attenuation within the subcortical and periventricular white matter compatible with chronic microvascular disease. Prominence of the sulci and ventricles identified compatible with brain atrophy. Vascular: Asymmetric increased density within a branch of the right middle cerebral artery is noted, image 15/2. Skull: Normal. Negative for fracture or focal lesion. Sinuses/Orbits: No acute finding. Other: None. IMPRESSION: 1. 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 2. Asymmetric increased density within a branch of the right middle cerebral artery is identified, concerning for thrombus. 3. No sign of acute intracranial hemorrhage. 4. Chronic microvascular disease and brain atrophy. Critical Value/emergent results were called by telephone at the time of interpretation on 10/26/2022 at 1:09 pm to provider [name] [name] , who verbally acknowledged these results. Electronically Signed   By: [name] [name] M.D.   On: 10/26/2022 13:09   DG Chest Port 1 View  Result Date: 10/26/2022 CLINICAL DATA:  Altered mental status EXAM: PORTABLE CHEST 1 VIEW COMPARISON:  Chest x-ray dated  August 09, 2022 FINDINGS: Cardiac and mediastinal contours are within normal limits for exam technique. Lungs are clear. No evidence of pleural effusion or pneumothorax. Patient head position somewhat limits evaluation of the upper lungs. IMPRESSION: No active disease. Electronically Signed   By: [name] [name] M.D.   On: 10/26/2022 12:37   (Echo, Carotid, EGD, Colonoscopy, ERCP)    Subjective:   Discharge Exam: Vitals:   11/02/22 0313 11/02/22 0721  BP: 118/79 (!) 149/84  Pulse:    Resp: 18 20  Temp: 98 F (36.7 C) 97.6 F (36.4 C)  SpO2: 100% 98%   Vitals:   11/01/22 1953 11/01/22 2320 11/02/22 0313 11/02/22 0721  BP: (!) 142/87 (!) 164/86 118/79 (!) 149/84  Pulse: (!) 52 (!) 57    Resp: 18 18 18 20   Temp: (!) 97.5 F (36.4 C) 97.8 F (36.6 C) 98 F (36.7 C) 97.6 F (36.4 C)  TempSrc:  Oral Oral Axillary  SpO2: 100% 100% 100% 98%  Weight:      Height:        General: Pt is alert, awake, not in acute distress Cardiovascular: S1/S2 +, no rubs, no gallops  Respiratory: CTA bilaterally, no wheezing, no rhonchi Abdominal: Soft, NT, ND, bowel sounds +     The results of significant diagnostics from this hospitalization (including imaging, microbiology, ancillary and laboratory) are listed below for reference.     Microbiology: No results found for this or any previous visit (from the past 240 hour(s)).   Labs: BNP (last 3 results) No results for input(s): "BNP" in the last 8760 hours. Basic Metabolic Panel: Recent Labs  Lab 10/27/22 0436 10/28/22 0604 11/01/22 0810  NA 136 137 137  K 3.4* 3.6 4.0  CL 102 103 100  CO2 25 26 26   GLUCOSE 66* 73 76  BUN 7* 10 12  CREATININE 0.87 0.92 1.01  CALCIUM 8.7* 9.0 9.5  MG 1.7  --   --   PHOS 2.8  --   --    Liver Function Tests: No results for input(s): "AST", "ALT", "ALKPHOS", "BILITOT", "PROT", "ALBUMIN" in the last 168 hours. No results for input(s): "LIPASE", "AMYLASE" in the last 168 hours. No results for  input(s): "AMMONIA" in the last 168 hours. CBC: Recent Labs  Lab 10/27/22 0436 11/01/22 0810  WBC 5.8 6.6  NEUTROABS 2.9  --   HGB 11.2* 14.2  HCT 32.4* 42.0  MCV 82.7 83.7  PLT 159 185   Cardiac Enzymes: No results for input(s): "CKTOTAL", "CKMB", "CKMBINDEX", "TROPONINI" in the last 168 hours. BNP: Invalid input(s): "POCBNP" CBG: Recent Labs  Lab 10/31/22 1119 10/31/22 1619 10/31/22 2026 11/02/22 0717 11/02/22 1146  GLUCAP 80 92 73 64* 212*   D-Dimer No results for input(s): "DDIMER" in the last 72 hours. Hgb A1c No results for input(s): "HGBA1C" in the last 72 hours. Lipid Profile No results for input(s): "CHOL", "HDL", "LDLCALC", "TRIG", "CHOLHDL", "LDLDIRECT" in the last 72 hours. Thyroid function studies No results for input(s): "TSH", "T4TOTAL", "T3FREE", "THYROIDAB" in the last 72 hours.  Invalid input(s): "FREET3" Anemia work up No results for input(s): "VITAMINB12", "FOLATE", "FERRITIN", "TIBC", "IRON", "RETICCTPCT" in the last 72 hours. Urinalysis    Component Value Date/Time   COLORURINE YELLOW (A) 10/26/2022 2005   APPEARANCEUR HAZY (A) 10/26/2022 2005   LABSPEC 1.035 (H) 10/26/2022 2005   PHURINE 6.0 10/26/2022 2005   GLUCOSEU NEGATIVE 10/26/2022 2005   HGBUR NEGATIVE 10/26/2022 2005   BILIRUBINUR NEGATIVE 10/26/2022 2005   KETONESUR NEGATIVE 10/26/2022 2005   PROTEINUR NEGATIVE 10/26/2022 2005   NITRITE NEGATIVE 10/26/2022 2005   LEUKOCYTESUR NEGATIVE 10/26/2022 2005   Sepsis Labs Recent Labs  Lab 10/27/22 0436 11/01/22 0810  WBC 5.8 6.6   Microbiology No results found for this or any previous visit (from the past 240 hour(s)).   Time coordinating discharge: Over 30 minutes  SIGNED:   Charise Killian, MD  [facility] 11/02/2022, 12:57 PM Pager   If 7PM-7AM, please contact night-coverage [email or web address]

## 2022-11-02 NOTE — TOC Transition Note (Signed)
Transition of Care [facility]) - CM/SW Discharge Note   Patient Details  Name: [name] [name] MRN: [id number] Date of Birth: 1940/07/03  Transition of Care [facility]) CM/SW Contact:  [name], RN Phone Number: 11/02/2022, 12:57 PM   Clinical Narrative:   SNF authorization approved.  Plan Auth ID [id number] Auth ID- [id number] Approved from 11-02-2022-11-06-2022.  Next review date is 11-06-2022.    I have informed Allision with [facility] that patient has received SNF approval.  Allision reports that she will have a bed for the patient today.  Revonda Standard reports that patient will go to room 509A and the number to call report is [phone].  I have informed Allision that I will send her DC Summary, Discharge orders, and SNF transfer report via the hub.    I have informed Mrs. Stcyr and Mr. Pacey that SNF  has been approved for Mr. Totherow and  that patient will discharge to [facility] today.  I have informed Mrs. Prentiss  and Mr. Kunath that [facility] will transport patient to the facility today.    I will arranged EMS transport for patient today by [facility].    I have informed staff nurse of the above information.     Final next level of care: Skilled Nursing Facility Barriers to Discharge: No Barriers Identified   Patient Goals and CMS Choice CMS Medicare.gov Compare Post Acute Care list provided to:: Patient Choice offered to / list presented to : Patient  Discharge Placement                Patient chooses bed at: [facility] Patient to be transferred to facility by: [facility] Name of family member notified: Mrs. Molenda Patient and family notified of of transfer: 11/02/22  Discharge Plan and Services Additional resources added to the After Visit Summary for       Post Acute Care Choice: Skilled Nursing Facility                               Social Determinants of Health (SDOH) Interventions SDOH Screenings    Food Insecurity: No Food Insecurity (10/26/2022)  Housing: Low Risk  (10/26/2022)  Transportation Needs: No Transportation Needs (10/26/2022)  Utilities: Not At Risk (10/26/2022)  Tobacco Use: High Risk (10/26/2022)     Readmission Risk Interventions     No data to display

## 2022-11-06 DIAGNOSIS — G9340 Encephalopathy, unspecified: Secondary | ICD-10-CM | POA: Diagnosis not present

## 2022-11-06 DIAGNOSIS — R5381 Other malaise: Secondary | ICD-10-CM | POA: Diagnosis not present

## 2022-11-10 DIAGNOSIS — N182 Chronic kidney disease, stage 2 (mild): Secondary | ICD-10-CM | POA: Diagnosis not present

## 2022-11-10 DIAGNOSIS — G9340 Encephalopathy, unspecified: Secondary | ICD-10-CM | POA: Diagnosis not present

## 2022-11-10 DIAGNOSIS — I739 Peripheral vascular disease, unspecified: Secondary | ICD-10-CM | POA: Diagnosis not present

## 2022-11-10 DIAGNOSIS — M869 Osteomyelitis, unspecified: Secondary | ICD-10-CM | POA: Diagnosis not present

## 2022-11-10 DIAGNOSIS — E119 Type 2 diabetes mellitus without complications: Secondary | ICD-10-CM | POA: Diagnosis not present

## 2022-11-10 DIAGNOSIS — Z89612 Acquired absence of left leg above knee: Secondary | ICD-10-CM | POA: Diagnosis not present

## 2022-11-10 DIAGNOSIS — R5381 Other malaise: Secondary | ICD-10-CM | POA: Diagnosis not present

## 2022-11-10 DIAGNOSIS — I63411 Cerebral infarction due to embolism of right middle cerebral artery: Secondary | ICD-10-CM | POA: Diagnosis not present

## 2022-11-10 DIAGNOSIS — I5032 Chronic diastolic (congestive) heart failure: Secondary | ICD-10-CM | POA: Diagnosis not present

## 2022-11-14 ENCOUNTER — Telehealth (INDEPENDENT_AMBULATORY_CARE_PROVIDER_SITE_OTHER): Payer: Self-pay

## 2022-11-14 DIAGNOSIS — M6281 Muscle weakness (generalized): Secondary | ICD-10-CM | POA: Diagnosis not present

## 2022-11-14 DIAGNOSIS — E1122 Type 2 diabetes mellitus with diabetic chronic kidney disease: Secondary | ICD-10-CM | POA: Diagnosis not present

## 2022-11-14 DIAGNOSIS — G9340 Encephalopathy, unspecified: Secondary | ICD-10-CM | POA: Diagnosis not present

## 2022-11-14 DIAGNOSIS — I130 Hypertensive heart and chronic kidney disease with heart failure and stage 1 through stage 4 chronic kidney disease, or unspecified chronic kidney disease: Secondary | ICD-10-CM | POA: Diagnosis not present

## 2022-11-14 DIAGNOSIS — N179 Acute kidney failure, unspecified: Secondary | ICD-10-CM | POA: Diagnosis not present

## 2022-11-14 DIAGNOSIS — I69398 Other sequelae of cerebral infarction: Secondary | ICD-10-CM | POA: Diagnosis not present

## 2022-11-14 DIAGNOSIS — D631 Anemia in chronic kidney disease: Secondary | ICD-10-CM | POA: Diagnosis not present

## 2022-11-14 DIAGNOSIS — I5032 Chronic diastolic (congestive) heart failure: Secondary | ICD-10-CM | POA: Diagnosis not present

## 2022-11-14 DIAGNOSIS — N182 Chronic kidney disease, stage 2 (mild): Secondary | ICD-10-CM | POA: Diagnosis not present

## 2022-11-14 NOTE — Telephone Encounter (Signed)
[name] [name] called from [facility] stating they received a referral from the [facility]. The order was for only RN and occupational therapy. She would like to know if you could add on Physical therapy as well.  They also had a slight delay in seeing [name] [name] today per his request.   Please advise.

## 2022-11-14 NOTE — Telephone Encounter (Signed)
Message given

## 2022-11-16 DIAGNOSIS — I130 Hypertensive heart and chronic kidney disease with heart failure and stage 1 through stage 4 chronic kidney disease, or unspecified chronic kidney disease: Secondary | ICD-10-CM | POA: Diagnosis not present

## 2022-11-16 DIAGNOSIS — M6281 Muscle weakness (generalized): Secondary | ICD-10-CM | POA: Diagnosis not present

## 2022-11-16 DIAGNOSIS — E1122 Type 2 diabetes mellitus with diabetic chronic kidney disease: Secondary | ICD-10-CM | POA: Diagnosis not present

## 2022-11-16 DIAGNOSIS — D631 Anemia in chronic kidney disease: Secondary | ICD-10-CM | POA: Diagnosis not present

## 2022-11-16 DIAGNOSIS — G9340 Encephalopathy, unspecified: Secondary | ICD-10-CM | POA: Diagnosis not present

## 2022-11-16 DIAGNOSIS — N179 Acute kidney failure, unspecified: Secondary | ICD-10-CM | POA: Diagnosis not present

## 2022-11-16 DIAGNOSIS — I5032 Chronic diastolic (congestive) heart failure: Secondary | ICD-10-CM | POA: Diagnosis not present

## 2022-11-16 DIAGNOSIS — I69398 Other sequelae of cerebral infarction: Secondary | ICD-10-CM | POA: Diagnosis not present

## 2022-11-16 DIAGNOSIS — N182 Chronic kidney disease, stage 2 (mild): Secondary | ICD-10-CM | POA: Diagnosis not present

## 2022-11-17 DIAGNOSIS — D631 Anemia in chronic kidney disease: Secondary | ICD-10-CM | POA: Diagnosis not present

## 2022-11-17 DIAGNOSIS — I69398 Other sequelae of cerebral infarction: Secondary | ICD-10-CM | POA: Diagnosis not present

## 2022-11-17 DIAGNOSIS — G9340 Encephalopathy, unspecified: Secondary | ICD-10-CM | POA: Diagnosis not present

## 2022-11-17 DIAGNOSIS — E1122 Type 2 diabetes mellitus with diabetic chronic kidney disease: Secondary | ICD-10-CM | POA: Diagnosis not present

## 2022-11-17 DIAGNOSIS — I130 Hypertensive heart and chronic kidney disease with heart failure and stage 1 through stage 4 chronic kidney disease, or unspecified chronic kidney disease: Secondary | ICD-10-CM | POA: Diagnosis not present

## 2022-11-17 DIAGNOSIS — N179 Acute kidney failure, unspecified: Secondary | ICD-10-CM | POA: Diagnosis not present

## 2022-11-17 DIAGNOSIS — I5032 Chronic diastolic (congestive) heart failure: Secondary | ICD-10-CM | POA: Diagnosis not present

## 2022-11-17 DIAGNOSIS — N182 Chronic kidney disease, stage 2 (mild): Secondary | ICD-10-CM | POA: Diagnosis not present

## 2022-11-17 DIAGNOSIS — M6281 Muscle weakness (generalized): Secondary | ICD-10-CM | POA: Diagnosis not present

## 2022-11-20 ENCOUNTER — Encounter: Payer: Self-pay | Admitting: Emergency Medicine

## 2022-11-20 ENCOUNTER — Emergency Department: Payer: Medicare HMO

## 2022-11-20 ENCOUNTER — Other Ambulatory Visit: Payer: Self-pay

## 2022-11-20 ENCOUNTER — Inpatient Hospital Stay
Admission: EM | Admit: 2022-11-20 | Discharge: 2022-11-24 | DRG: 683 | Disposition: A | Discharge status: Home / Self Care | Payer: Medicare HMO | Attending: Internal Medicine | Admitting: Internal Medicine

## 2022-11-20 DIAGNOSIS — Z961 Presence of intraocular lens: Secondary | ICD-10-CM | POA: Diagnosis present

## 2022-11-20 DIAGNOSIS — Z89611 Acquired absence of right leg above knee: Secondary | ICD-10-CM

## 2022-11-20 DIAGNOSIS — Z9841 Cataract extraction status, right eye: Secondary | ICD-10-CM | POA: Diagnosis not present

## 2022-11-20 DIAGNOSIS — N400 Enlarged prostate without lower urinary tract symptoms: Secondary | ICD-10-CM | POA: Diagnosis present

## 2022-11-20 DIAGNOSIS — R319 Hematuria, unspecified: Secondary | ICD-10-CM | POA: Diagnosis not present

## 2022-11-20 DIAGNOSIS — I4891 Unspecified atrial fibrillation: Secondary | ICD-10-CM | POA: Diagnosis not present

## 2022-11-20 DIAGNOSIS — E861 Hypovolemia: Secondary | ICD-10-CM | POA: Diagnosis present

## 2022-11-20 DIAGNOSIS — R54 Age-related physical debility: Secondary | ICD-10-CM | POA: Diagnosis present

## 2022-11-20 DIAGNOSIS — N281 Cyst of kidney, acquired: Secondary | ICD-10-CM | POA: Diagnosis not present

## 2022-11-20 DIAGNOSIS — Z7982 Long term (current) use of aspirin: Secondary | ICD-10-CM

## 2022-11-20 DIAGNOSIS — Z7901 Long term (current) use of anticoagulants: Secondary | ICD-10-CM

## 2022-11-20 DIAGNOSIS — Z9049 Acquired absence of other specified parts of digestive tract: Secondary | ICD-10-CM | POA: Diagnosis not present

## 2022-11-20 DIAGNOSIS — R001 Bradycardia, unspecified: Secondary | ICD-10-CM | POA: Diagnosis not present

## 2022-11-20 DIAGNOSIS — Z7189 Other specified counseling: Secondary | ICD-10-CM | POA: Diagnosis not present

## 2022-11-20 DIAGNOSIS — R109 Unspecified abdominal pain: Secondary | ICD-10-CM | POA: Diagnosis not present

## 2022-11-20 DIAGNOSIS — I482 Chronic atrial fibrillation, unspecified: Secondary | ICD-10-CM | POA: Diagnosis not present

## 2022-11-20 DIAGNOSIS — E11649 Type 2 diabetes mellitus with hypoglycemia without coma: Secondary | ICD-10-CM | POA: Diagnosis not present

## 2022-11-20 DIAGNOSIS — I639 Cerebral infarction, unspecified: Secondary | ICD-10-CM | POA: Diagnosis not present

## 2022-11-20 DIAGNOSIS — R310 Gross hematuria: Secondary | ICD-10-CM | POA: Diagnosis not present

## 2022-11-20 DIAGNOSIS — Z89612 Acquired absence of left leg above knee: Secondary | ICD-10-CM

## 2022-11-20 DIAGNOSIS — E1142 Type 2 diabetes mellitus with diabetic polyneuropathy: Secondary | ICD-10-CM | POA: Diagnosis present

## 2022-11-20 DIAGNOSIS — I4821 Permanent atrial fibrillation: Secondary | ICD-10-CM | POA: Diagnosis not present

## 2022-11-20 DIAGNOSIS — Z66 Do not resuscitate: Secondary | ICD-10-CM | POA: Diagnosis present

## 2022-11-20 DIAGNOSIS — Z9181 History of falling: Secondary | ICD-10-CM | POA: Diagnosis not present

## 2022-11-20 DIAGNOSIS — I739 Peripheral vascular disease, unspecified: Secondary | ICD-10-CM | POA: Diagnosis not present

## 2022-11-20 DIAGNOSIS — Z515 Encounter for palliative care: Secondary | ICD-10-CM | POA: Diagnosis not present

## 2022-11-20 DIAGNOSIS — F17220 Nicotine dependence, chewing tobacco, uncomplicated: Secondary | ICD-10-CM | POA: Diagnosis not present

## 2022-11-20 DIAGNOSIS — I110 Hypertensive heart disease with heart failure: Secondary | ICD-10-CM | POA: Diagnosis present

## 2022-11-20 DIAGNOSIS — Z79899 Other long term (current) drug therapy: Secondary | ICD-10-CM | POA: Diagnosis not present

## 2022-11-20 DIAGNOSIS — I509 Heart failure, unspecified: Secondary | ICD-10-CM

## 2022-11-20 DIAGNOSIS — I5032 Chronic diastolic (congestive) heart failure: Secondary | ICD-10-CM | POA: Diagnosis not present

## 2022-11-20 DIAGNOSIS — N179 Acute kidney failure, unspecified: Principal | ICD-10-CM | POA: Diagnosis present

## 2022-11-20 DIAGNOSIS — N289 Disorder of kidney and ureter, unspecified: Secondary | ICD-10-CM | POA: Diagnosis not present

## 2022-11-20 DIAGNOSIS — Z7984 Long term (current) use of oral hypoglycemic drugs: Secondary | ICD-10-CM

## 2022-11-20 DIAGNOSIS — Z8673 Personal history of transient ischemic attack (TIA), and cerebral infarction without residual deficits: Secondary | ICD-10-CM | POA: Diagnosis not present

## 2022-11-20 DIAGNOSIS — E785 Hyperlipidemia, unspecified: Secondary | ICD-10-CM | POA: Diagnosis not present

## 2022-11-20 DIAGNOSIS — Z9842 Cataract extraction status, left eye: Secondary | ICD-10-CM

## 2022-11-20 DIAGNOSIS — I10 Essential (primary) hypertension: Secondary | ICD-10-CM | POA: Diagnosis not present

## 2022-11-20 LAB — URINALYSIS, ROUTINE W REFLEX MICROSCOPIC
RBC / HPF: >50 RBC/hpf (ref 0–5)
WBC, UA: >50 WBC/hpf (ref 0–5)

## 2022-11-20 LAB — BRAIN NATRIURETIC PEPTIDE: B Natriuretic Peptide: 94.1 pg/mL (ref 0.0–100.0)

## 2022-11-20 LAB — COMPREHENSIVE METABOLIC PANEL
ALT: 15 U/L (ref 0–44)
AST: 25 U/L (ref 15–41)
Albumin: 3.6 g/dL (ref 3.5–5.0)
Alkaline Phosphatase: 78 U/L (ref 38–126)
Anion gap: 15 (ref 5–15)
BUN: 41 mg/dL — ABNORMAL HIGH (ref 8–23)
CO2: 21 mmol/L — ABNORMAL LOW (ref 22–32)
Calcium: 9.9 mg/dL (ref 8.9–10.3)
Chloride: 104 mmol/L (ref 98–111)
Creatinine, Ser: 1.72 mg/dL — ABNORMAL HIGH (ref 0.61–1.24)
GFR, Estimated: 40 mL/min — ABNORMAL LOW (ref >60)
Glucose, Bld: 147 mg/dL — ABNORMAL HIGH (ref 70–99)
Potassium: 4.5 mmol/L (ref 3.5–5.1)
Sodium: 140 mmol/L (ref 135–145)
Total Bilirubin: 1.3 mg/dL — ABNORMAL HIGH (ref 0.3–1.2)
Total Protein: 8.3 g/dL — ABNORMAL HIGH (ref 6.5–8.1)

## 2022-11-20 LAB — CBC
HCT: 42.9 % (ref 39.0–52.0)
Hemoglobin: 15.2 g/dL (ref 13.0–17.0)
MCH: 28.9 pg (ref 26.0–34.0)
MCHC: 35.4 g/dL (ref 30.0–36.0)
MCV: 81.6 fL (ref 80.0–100.0)
Platelets: 210 10*3/uL (ref 150–400)
RBC: 5.26 MIL/uL (ref 4.22–5.81)
RDW: 13.9 % (ref 11.5–15.5)
WBC: 11.8 10*3/uL — ABNORMAL HIGH (ref 4.0–10.5)
nRBC: 0 % (ref 0.0–0.2)

## 2022-11-20 LAB — GLUCOSE, CAPILLARY
Glucose-Capillary: 42 mg/dL — CL (ref 70–99)
Glucose-Capillary: 64 mg/dL — ABNORMAL LOW (ref 70–99)
Glucose-Capillary: 81 mg/dL (ref 70–99)

## 2022-11-20 LAB — PROTIME-INR
INR: 2.2 — ABNORMAL HIGH (ref 0.8–1.2)
Prothrombin Time: 24.7 seconds — ABNORMAL HIGH (ref 11.4–15.2)

## 2022-11-20 LAB — DIGOXIN LEVEL: Digoxin Level: 1.9 ng/mL (ref 0.8–2.0)

## 2022-11-20 MED ORDER — ADULT MULTIVITAMIN W/MINERALS CH
1.0000 | ORAL_TABLET | Freq: Every day | ORAL | Status: DC
  Administered 2022-11-20 – 2022-11-24 (×5): 1 via ORAL
  Filled 2022-11-20 (×5): qty 1

## 2022-11-20 MED ORDER — ACETAMINOPHEN 325 MG PO TABS: 650.0000 mg | ORAL_TABLET | Freq: Four times a day (QID) | ORAL | Status: DC | PRN

## 2022-11-20 MED ORDER — THIAMINE MONONITRATE 100 MG PO TABS
100.0000 mg | ORAL_TABLET | Freq: Every day | ORAL | Status: DC
  Administered 2022-11-20 – 2022-11-24 (×5): 100 mg via ORAL
  Filled 2022-11-20 (×5): qty 1

## 2022-11-20 MED ORDER — ONDANSETRON HCL 4 MG/2ML IJ SOLN: 4.0000 mg | Freq: Four times a day (QID) | INTRAMUSCULAR | Status: DC | PRN

## 2022-11-20 MED ORDER — FOLIC ACID 1 MG PO TABS
1.0000 mg | ORAL_TABLET | Freq: Every day | ORAL | Status: DC
  Administered 2022-11-20 – 2022-11-24 (×5): 1 mg via ORAL
  Filled 2022-11-20 (×5): qty 1

## 2022-11-20 MED ORDER — FERROUS SULFATE 325 (65 FE) MG PO TABS
325.0000 mg | ORAL_TABLET | Freq: Every day | ORAL | Status: DC
  Administered 2022-11-21 – 2022-11-24 (×4): 325 mg via ORAL
  Filled 2022-11-20 (×4): qty 1

## 2022-11-20 MED ORDER — ONDANSETRON HCL 4 MG PO TABS: 4.0000 mg | ORAL_TABLET | Freq: Four times a day (QID) | ORAL | Status: DC | PRN

## 2022-11-20 MED ORDER — INSULIN ASPART 100 UNIT/ML IJ SOLN: 0.0000 [IU] | Freq: Three times a day (TID) | INTRAMUSCULAR | Status: DC

## 2022-11-20 MED ORDER — RIVAROXABAN 20 MG PO TABS
20.0000 mg | ORAL_TABLET | Freq: Every day | ORAL | Status: DC
  Administered 2022-11-20: 20 mg via ORAL
  Filled 2022-11-20 (×2): qty 1

## 2022-11-20 MED ORDER — GABAPENTIN 100 MG PO CAPS
100.0000 mg | ORAL_CAPSULE | Freq: Three times a day (TID) | ORAL | Status: DC
  Administered 2022-11-20 – 2022-11-24 (×11): 100 mg via ORAL
  Filled 2022-11-20 (×11): qty 1

## 2022-11-20 MED ORDER — VITAMIN B-12 1000 MCG PO TABS
1000.0000 ug | ORAL_TABLET | Freq: Every day | ORAL | Status: DC
  Administered 2022-11-21 – 2022-11-24 (×4): 1000 ug via ORAL
  Filled 2022-11-20 (×4): qty 1

## 2022-11-20 MED ORDER — DEXTROSE 50 % IV SOLN
25.0000 g | INTRAVENOUS | Status: DC | PRN
  Administered 2022-11-23: 25 g via INTRAVENOUS
  Filled 2022-11-20: qty 50

## 2022-11-20 MED ORDER — SODIUM CHLORIDE 0.9 % IV BOLUS
1000.0000 mL | Freq: Once | INTRAVENOUS | Status: AC
  Administered 2022-11-20: 1000 mL via INTRAVENOUS

## 2022-11-20 MED ORDER — LACTATED RINGERS IV SOLN: INTRAVENOUS | Status: DC

## 2022-11-20 MED ORDER — ATORVASTATIN CALCIUM 10 MG PO TABS
10.0000 mg | ORAL_TABLET | Freq: Every day | ORAL | Status: DC
  Administered 2022-11-21 – 2022-11-24 (×4): 10 mg via ORAL
  Filled 2022-11-20 (×4): qty 1

## 2022-11-20 MED ORDER — ASPIRIN 81 MG PO TBEC
81.0000 mg | DELAYED_RELEASE_TABLET | Freq: Every day | ORAL | Status: DC
  Administered 2022-11-21 – 2022-11-24 (×4): 81 mg via ORAL
  Filled 2022-11-20 (×4): qty 1

## 2022-11-20 MED ORDER — ACETAMINOPHEN 650 MG RE SUPP: 650.0000 mg | Freq: Four times a day (QID) | RECTAL | Status: DC | PRN

## 2022-11-20 MED ORDER — SODIUM CHLORIDE 0.9 % IV SOLN
1.0000 g | INTRAVENOUS | Status: DC
  Administered 2022-11-20 – 2022-11-22 (×3): 1 g via INTRAVENOUS
  Filled 2022-11-20 (×4): qty 10

## 2022-11-20 MED ORDER — SODIUM CHLORIDE 0.9% FLUSH
3.0000 mL | Freq: Two times a day (BID) | INTRAVENOUS | Status: DC
  Administered 2022-11-21 – 2022-11-24 (×4): 3 mL via INTRAVENOUS

## 2022-11-20 NOTE — ED Triage Notes (Signed)
Pt brought in by family with co bloody urine that started last night, no hx of the same. Pt denies any pain or other complaints at this time. Pt is poor historian, wife also checking in.

## 2022-11-20 NOTE — ED Notes (Signed)
MD aware of patient HR range at this time, pt is difficult to obtain temperature and oxygen levels on also.

## 2022-11-20 NOTE — Progress Notes (Addendum)
  Progress Note  Notified by patient's RN that patient's heart rate was in the 40s when telemetry leads were applied to obtain vital signs.  Noted to be worse when patient is sleeping.    # Atrial fibrillation with slow ventricular rate:  History of atrial fibrillation with slow ventricular rate in the past, with instructions to discontinue Coreg after his recent admission.  Per pharmacy review, patient has continued to receive his Coreg with most recent dose last evening.  - Will order telemetry monitoring - EKG - Digoxin level pending - Hold home digoxin for now - Discontinue home Coreg permanently - If no improvement, could consider consulting cardiology, however given poor baseline functioning now after recent extensive CVA, I doubt patient will be a candidate for any advanced therapy including PPM.   # Goals of Care:  [name] and I had a frank discussion regarding CODE STATUS.  I explained to Mrs. Forrester what CPR entails and the damage that can be done to the body.  We discussed that patients go on life support, and there is no guarantee of being able to wean off life support.  She states that in the past, Mr. Lordan told her that he would want resuscitation but this was after his large stroke and she wonders if he understood the gravity of his decision.  She does not believe at this time that he would have wanted such invasive measures to be taken.  In the event of cardiac arrest, she would does not want [place] to perform cardiac resuscitation.  In terms of pulmonary arrest, given ventilation is still considered life support, she does not want [place] to pursue pulmonary resuscitation either.  We discussed his slow ventricular rate in the settings of his goals of care.  She is uncertain on her decision regarding transcutaneous pacing, especially in light of patient unlikely being a candidate for permanent pacing.  She would like to be updated if there are any drops in his heart rate that require  such a decision.  - CODE STATUS changed to DNR/DNI  Author: Verdene Lennert, MD 11/20/2022 6:48 PM  For on call review [email or web address].

## 2022-11-20 NOTE — ED Notes (Signed)
IV will not drawn back blood but does flush with no issues. Attempting straight sticking patient multiple times for dig blood level. Unable to obtain blood called lab to come and try.

## 2022-11-20 NOTE — Assessment & Plan Note (Addendum)
Patient is presenting with poor p.o. intake and likely underlying UTI with evidence of AKI with creatinine 1.72 and GFR 40.  No evidence of hydronephrosis seen on imaging  - S/p 1 L bolus of normal saline - Continue maintenance fluids overnight - Monitor closely for any signs of fluid overload given history of CHF - Repeat BMP in the a.m. - Avoid nephrotoxic agents, including home lisinopril and Lasix

## 2022-11-20 NOTE — H&P (Signed)
History and Physical    Patient: [name] [name] ZOX:[id number] DOB: 11/10/1940 DOA: 11/20/2022 DOS: the patient was seen and examined on 11/20/2022 PCP: Luciana Axe, NP  Patient coming from: Home  Chief Complaint:  Chief Complaint  Patient presents with   Hematuria   HPI: [name] [name] is a 82 y.o. 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 Mrs. Kiel states patient has had poor appetite over the last 3 days. Then over the last 1 day, she has noticed blood in his urine and briefs. She denies any fever, chills, vomiting, abdominal pain or diarrhea. Patient's brother in law states that patient is usually oriented only to self now and has become generally somnolent since his CVA. Patient is unable to participate in transferring and wife is unable to lift him to transfer. Mr. Bowar went to a SNF for 1 week but family was unable to afford bill after the initial week.   ED course: On arrival to the ED, patient was normotensive at 161/116 with heart rate of 66.  He was saturating at 98% on room air.  He was afebrile at 97.  Initial workup notable for WBC of 11.8, bicarb 21, glucose 147, BUN 41, creatinine 1.72 with GFR 40.  INR 2.2. CT renal stone study without evidence of nephroureterolithiasis or obstructive uropathy, however significant circumferential wall thickening of the urinary bladder and enlarged prostate.  Urinalysis still pending.  Patient started on IV fluids.  TRH contacted for admission.  Review of Systems: unable to review all systems due to the inability of the patient to answer questions.  Past Medical History:  Diagnosis Date   CHF  (congestive heart failure) (HCC)    Diabetes (HCC)    Dysrhythmia    AFIB   HTN (hypertension)    Infection    LEG   Pancreatitis    Past Surgical History:  Procedure Laterality Date   ABOVE KNEE LEG AMPUTATION Bilateral    AMPUTATION Right 03/09/2021   Procedure: AMPUTATION FOOT;  Surgeon: Annice Needy, MD;  Location: [facility] ORS;  Service: General;  Laterality: Right;   AMPUTATION Right 03/11/2021   Procedure: AMPUTATION ABOVE KNEE;  Surgeon: Annice Needy, MD;  Location: [facility] ORS;  Service: General;  Laterality: Right;   AMPUTATION Left 08/13/2022   Procedure: AMPUTATION ABOVE KNEE;  Surgeon: Bertram Denver, MD;  Location: [facility] ORS;  Service: Vascular;  Laterality: Left;   CATARACT EXTRACTION W/PHACO Right 03/08/2017   Procedure: CATARACT EXTRACTION PHACO AND INTRAOCULAR LENS PLACEMENT (IOC);  Surgeon: Nevada Crane, MD;  Location: [facility] ORS;  Service: Ophthalmology;  Laterality: Right;  Lot# [id number] H [place]: 00:30.9 AP%: 8.2 CDE: 2.54   CATARACT EXTRACTION W/PHACO Left 04/04/2017   Procedure: CATARACT EXTRACTION PHACO AND INTRAOCULAR LENS PLACEMENT (IOC);  Surgeon: Nevada Crane, MD;  Location: [facility] ORS;  Service: Ophthalmology;  Laterality: Left;  [place] 00:31.4 AP% 12.0 CDE 3.78 Fluid Pack lot # [id number]   CHOLECYSTECTOMY     EYE SURGERY     right eye   LOWER EXTREMITY ANGIOGRAPHY Right 02/10/2021   Procedure: LOWER EXTREMITY ANGIOGRAPHY;  Surgeon: Annice Needy, MD;  Location: [facility]ASIVE [facility];  Service: Cardiovascular;  Laterality: Right;   LOWER EXTREMITY  ANGIOGRAPHY Left 07/03/2022   Procedure: Lower Extremity Angiography;  Surgeon: Annice Needy, MD;  Location: [facility];  Service: Cardiovascular;  Laterality: Left;   LOWER EXTREMITY ANGIOGRAPHY Left 07/24/2022   Procedure: Lower Extremity Angiography;  Surgeon: Annice Needy, MD;  Location: [facility];  Service: Cardiovascular;  Laterality: Left;   LOWER EXTREMITY ANGIOGRAPHY Left 08/11/2022    Procedure: Lower Extremity Angiography;  Surgeon: Annice Needy, MD;  Location: [facility];  Service: Cardiovascular;  Laterality: Left;   Social History:  reports that he has never smoked. His smokeless tobacco use includes chew. He reports that he does not drink alcohol and does not use drugs.  No Known Allergies  History reviewed. No pertinent family history.  Prior to Admission medications   Medication Sig Start Date End Date Taking? Authorizing Provider  ascorbic acid (VITAMIN C) 500 MG tablet Take 500 mg by mouth daily. 10/06/21   [provider]  aspirin EC 81 MG tablet Take 1 tablet (81 mg total) by mouth daily. Swallow whole. 07/03/22 07/03/23  Annice Needy, MD  atorvastatin (LIPITOR) 10 MG tablet Take 1 tablet (10 mg total) by mouth daily. 02/10/21 10/26/22  Annice Needy, MD  carvedilol (COREG) 6.25 MG tablet Take 1 tablet (6.25 mg total) by mouth 2 (two) times daily. HOLD THIS MEDICATION UNTIL PT SEES CARDIO SECONDARY TO BRADYCARDIA 11/02/22   Charise Killian, MD  digoxin (LANOXIN) 0.125 MG tablet Take 0.125 mcg daily by mouth. 12/19/16   [provider]  ferrous sulfate 325 (65 FE) MG tablet Take by mouth. 05/11/21 10/26/22  [provider]  furosemide (LASIX) 20 MG tablet Take 20 mg daily by mouth. 12/19/16   [provider]  gabapentin (NEURONTIN) 100 MG capsule Take 1 capsule (100 mg total) by mouth 3 (three) times daily. 03/16/21 08/10/22  Charise Killian, MD  lisinopril (PRINIVIL,ZESTRIL) 10 MG tablet Take 15 mg by mouth daily.    [provider]  metFORMIN (GLUCOPHAGE) 1000 MG tablet Take 1,000 mg by mouth daily.    [provider]  rivaroxaban (XARELTO) 20 MG TABS tablet Take 20 mg daily by mouth.    [provider]  vitamin B-12 (CYANOCOBALAMIN) 1000 MCG tablet Take 1,000 mcg by mouth daily.    [provider]    Physical Exam: Vitals:   11/20/22 1041 11/20/22 1245 11/20/22 1459 11/20/22 1720   BP:   (!) 138/100 130/88  Pulse:   66 68  Resp:   16 16  Temp:   (!) 97 F (36.1 C)   TempSrc:   Axillary   SpO2:   98% 98%  Weight: 88.9 kg 88.9 kg    Height:  6' (1.829 m)     Physical Exam Vitals and nursing note reviewed.  Constitutional:      General: He is not in acute distress.    Appearance: He is normal weight.     Comments: Somnolent but will open eyes when asked.   HENT:     Head: Normocephalic and atraumatic.     Mouth/Throat:     Mouth: Mucous membranes are dry.     Pharynx: Oropharynx is clear.     Comments: adentulous Cardiovascular:     Rate and Rhythm: Bradycardia present. Rhythm irregular.     Heart sounds: No murmur heard. Pulmonary:     Effort: Pulmonary effort is normal. No respiratory distress.     Breath sounds: Normal breath sounds. No  wheezing, rhonchi or rales.  Abdominal:     General: Bowel sounds are normal. There is no distension.     Palpations: Abdomen is soft.     Tenderness: There is no abdominal tenderness. There is no guarding.  Musculoskeletal:     Right Lower Extremity: Right leg is amputated above knee.     Left Lower Extremity: Left leg is amputated above knee.  Skin:    General: Skin is warm and dry.  Neurological:     Comments:  Patient did not answer questions when asked and quickly fell asleep.     Data Reviewed: CBC with WBC of 11.8, hemoglobin 15.2, platelets of 210 CMP with sodium of 140, potassium 4.5, bicarb 21, glucose 147, BUN 41, creatinine 1.72, total protein 8.3, total bilirubin 1.3 GFR 40 INR 2.2  US RENAL  Result Date: 11/20/2022 CLINICAL DATA:  Gross hematuria. Exophytic renal lesion on noncontrast CT earlier today. EXAM: RENAL / URINARY TRACT ULTRASOUND COMPLETE COMPARISON:  CT stone study earlier today. FINDINGS: Right Kidney: Renal measurements: 10.1 x 4.3 x 4.3 cm = volume: 97 mL. Echogenicity within normal limits. 3.5 x 3.0 x 3.2 cm exophytic cyst is identified in the upper pole. This contains a single  internal septation measuring up to 3 mm. Left Kidney: Renal measurements: 10.3 x 5.1 x 4.8 cm = volume: 131 mL. Echogenicity within normal limits. No mass or hydronephrosis visualized. Bladder: As on CT scan earlier today, marked circumferential bladder wall thickening evident. Other: None. IMPRESSION: 1. 3.5 cm exophytic cyst in the upper pole of the right kidney contains a single internal septation. Septation measures up to 3 mm in thickness with a somewhat nodular character. 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 Follow-up renal ultrasound in 3-6 months recommended to ensure stability. 2. Marked circumferential bladder wall thickening. Infection/inflammation not excluded. Electronically Signed   By: Kennith Center M.D.   On: 11/20/2022 17:20   CT Renal Stone Study  Result Date: 11/20/2022 CLINICAL DATA:  Abdominal/flank pain, stone suspected gross hematuria EXAM: CT ABDOMEN AND PELVIS WITHOUT CONTRAST TECHNIQUE: Multidetector CT imaging of the abdomen and pelvis was performed following the standard protocol without IV contrast. RADIATION DOSE REDUCTION: This exam was performed according to the departmental dose-optimization program which includes automated exposure control, adjustment of the mA and/or kV according to patient size and/or use of iterative reconstruction technique. COMPARISON:  None Available. FINDINGS: Lower chest: There are patchy atelectatic changes in the visualized lung bases. No overt consolidation. No pleural effusion. The heart is normal in size. No pericardial effusion. Bilateral moderate-to-severe symmetric gynecomastia noted. Hepatobiliary: The liver is normal in size. Non-cirrhotic configuration. No suspicious mass. No intrahepatic or extrahepatic bile duct dilation. Gallbladder is surgically absent. Pancreas: Unremarkable. No pancreatic ductal dilatation or surrounding inflammatory changes. Spleen: Within normal limits. No focal lesion. Adrenals/Urinary Tract:  Adrenal glands are unremarkable. No suspicious renal mass. There is a partially exophytic 2.3 x 3.6 cm hypoattenuating structure arising from the right kidney upper pole, laterally. The structure exhibits internal CT attenuation of 24-25 Hounsfield units. There is a single, thin, incomplete calcified septum. This is incompletely characterized on the current examination but favored to represent a proteinaceous/hemorrhagic cyst. Further evaluation with nonemergent renal ultrasound is recommended. No hydronephrosis. No renal or ureteric calculi. The urinary bladder is minimally distended. However, there is significant uniform circumferential wall thickening measuring up to 1.8 cm. No perivesical fat stranding. Stomach/Bowel: No disproportionate dilation of the small or large bowel loops. No  evidence of abnormal bowel wall thickening or inflammatory changes. The appendix is unremarkable. Vascular/Lymphatic: No ascites or pneumoperitoneum. No abdominal or pelvic lymphadenopathy, by size criteria. No aneurysmal dilation of the major abdominal arteries. There are mild peripheral atherosclerotic vascular calcifications of the aorta and its major branches. Reproductive: Enlarged prostate. Symmetric seminal vesicles. Other: The visualized soft tissues and abdominal wall are unremarkable. Musculoskeletal: No suspicious osseous lesions. There are mild multilevel degenerative changes in the visualized spine. IMPRESSION: 1. No nephroureterolithiasis or obstructive uropathy. 2. Significant circumferential wall thickening of the urinary bladder. This may be secondary to chronic bladder outlet obstruction or cystitis. Correlate with urinalysis. 3. Enlarged prostate. 4. Incompletely characterized right renal lesion. Recommend renal ultrasound for further evaluation. Electronically Signed   By: Jules Schick M.D.   On: 11/20/2022 15:03    Results are pending, will review when available.  Assessment and Plan:  * AKI (acute  kidney injury) (HCC) Patient is presenting with poor p.o. intake and likely underlying UTI with evidence of AKI with creatinine 1.72 and GFR 40.  No evidence of hydronephrosis seen on imaging  - S/p 1 L bolus of normal saline - Continue maintenance fluids overnight - Monitor closely for any signs of fluid overload given history of CHF - Repeat BMP in the a.m. - Avoid nephrotoxic agents, including home lisinopril and Lasix  Hematuria Patient is presenting with new onset hematuria and elevated WBC at 11.8.  Hematuria may be exacerbated by patient's use of Xarelto, however it cannot be held given recent CVA.  Strongly suspect underlying urinary tract infection given bladder wall thickening seen on CT; the urinalysis is still pending  - Start empiric ceftriaxone; discontinue if urinalysis does not show any evidence of infection - Urinalysis with reflex pending  CVA (cerebral vascular accident) [facility]) Patient was recently admitted on 7/4 for acute CVA involving the right MCA, that was extensive nature.  Since then, patient's baseline functioning has significantly declined.  He went to a SNF for 1 week, but patient's family was unable to afford further rehabilitation and patient went home.  Per patient's wife, she is unable to help him with transfers and patient is no longer able to do it on his own.   - Continue home aspirin, Xarelto and statin - PT/OT  CHF (congestive heart failure) (HCC) Patient appears hypovolemic at this time.  Hold home Lasix and restart prior to discharge  - Daily weights - Strict in and out  Essential hypertension - Hold home lisinopril in the setting of AKI  Type 2 diabetes mellitus with peripheral neuropathy (HCC) - Hold home antiglycemic agents - SSI, sensitive given poor p.o. intake  Atrial fibrillation (HCC) - Continue home digoxin and Xarelto  Advance Care Planning:   Code Status: Full Code verified by patient's wife.   Consults: None  Family  Communication: [name] updated at bedside  Severity of Illness: The appropriate patient status for this patient is OBSERVATION. Observation status is judged to be reasonable and necessary in order to provide the required intensity of service to ensure the patient's safety. The patient's presenting symptoms, physical exam findings, and initial radiographic and laboratory data in the context of their medical condition is felt to place them at decreased risk for further clinical deterioration. Furthermore, it is anticipated that the patient will be medically stable for discharge from the hospital within 2 midnights of admission.   Author: Verdene Lennert, MD 11/20/2022 5:55 PM  For on call review [email or web address].

## 2022-11-20 NOTE — Assessment & Plan Note (Addendum)
Patient appears hypovolemic at this time.  Hold home Lasix and restart prior to discharge  - Daily weights - Strict in and out

## 2022-11-20 NOTE — Assessment & Plan Note (Signed)
-   Hold home antiglycemic agents - SSI, sensitive given poor p.o. intake

## 2022-11-20 NOTE — ED Notes (Signed)
See triage note  Presents with family  They noticed some blood in his depends yesterday  Small amt note at present

## 2022-11-20 NOTE — Assessment & Plan Note (Signed)
-   Hold home lisinopril in the setting of AKI

## 2022-11-20 NOTE — ED Notes (Signed)
MD aware of bradycardia. Pt still at baseline A&OX1 and somnolent. MD with no new orders. Will cont to monitor and page md is bradycardia is sustained. No external pacing needed for pt at this time per MD

## 2022-11-20 NOTE — Assessment & Plan Note (Signed)
[name] and I had a frank discussion regarding CODE STATUS.  I explained to Mrs. Goeden what CPR entails and the damage that can be done to the body.  We discussed that patients go on life support, and there is no guarantee of being able to wean off life support.  She states that in the past, Mr. Lehrman told her that he would want resuscitation but this was after his large stroke and she wonders if he understood the gravity of his decision.  She does not believe at this time that he would have wanted such invasive measures to be taken.  In the event of cardiac arrest, she would does not want [place] to perform cardiac resuscitation.  In terms of pulmonary arrest, given ventilation is still considered life support, she does not want [place] to pursue pulmonary resuscitation either.  We discussed his slow ventricular rate in the settings of his goals of care.  She is uncertain on her decision regarding transcutaneous pacing, especially in light of patient unlikely being a candidate for permanent pacing.  She would like to be updated if there are any drops in his heart rate that require such a decision.   - CODE STATUS changed to DNR/DNI

## 2022-11-20 NOTE — Assessment & Plan Note (Addendum)
History of atrial fibrillation with slow ventricular rate in the past, with instructions to discontinue Coreg after his recent admission.  Per pharmacy review, patient has continued to receive his Coreg with most recent dose last evening.   - Will order telemetry monitoring - EKG - Digoxin level pending - Hold home digoxin for now - Discontinue home Coreg permanently - If no improvement, could consider consulting cardiology, however given poor baseline functioning now after recent extensive CVA, I doubt patient will be a candidate for any advanced therapy including PPM.

## 2022-11-20 NOTE — ED Provider Notes (Signed)
[facility] Emergency Department Provider Note     Event Date/Time   First MD Initiated Contact with Patient 11/20/22 1311     (approximate)   History   Hematuria   HPI  History is limited as patient is unable to provide any HPI secondary to CVA.  HPI provided by the patient's wife.  [name] [name] is a 82 y.o. male with a history of A-fib on Xarelto, CHF, type 2 DM, GERD, HTN, BLE AKA, and a CVA 3 weeks prior with hospital admission; presents to the ED for evaluation of gross hematuria.  Accompanied by his family, who endorses onset of blood-stained disposable briefs with onset last night.  Denies any history of the same.  No reports of any urinary retention, frequency, urgency, or flank pain.  No fevers, chills, sweats, vomiting, bowel changes.  Wife would endorse patient has not had as much to drink as she expected over the last few days.  The family has some sporadic home health nurse visits as well as recent orders for PT/OT.  Wife notes patient prior to the stroke was able to transfer himself from the bed to his chair without difficulty.  Since the stroke the patient has no ability to transfer himself, as she is unable to transfer him physically.  Physical Exam   Triage Vital Signs: ED Triage Vitals  Encounter Vitals Group     BP 11/20/22 1040 (!) 141/116     Systolic BP Percentile --      Diastolic BP Percentile --      Pulse Rate 11/20/22 1040 66     Resp 11/20/22 1040 16     Temp --      Temp src --      SpO2 11/20/22 1040 98 %     Weight 11/20/22 1041 196 lb (88.9 kg)     Height 11/20/22 1245 6' (1.829 m)     Head Circumference --      Peak Flow --      Pain Score 11/20/22 1041 0     Pain Loc --      Pain Education --      Exclude from Growth Chart --     Most recent vital signs: Vitals:   11/20/22 1459 11/20/22 1720  BP: (!) 138/100 130/88  Pulse: 66 68  Resp: 16 16  Temp: (!) 97 F (36.1 C)   SpO2: 98% 98%    General Awake,  no distress. A&O to person HEENT NCAT. PERRL. EOMI. No rhinorrhea. Mucous membranes are dry.  CV:  Good peripheral perfusion. RRR RESP:  Normal effort. CTA ABD:  No distention.  Soft and nontender.  No rebound, guarding, or rigidity noted. GU:  Uncircumcised glans with no gross hematuria or lesions noted on evaluation.   ED Results / Procedures / Treatments   Labs (all labs ordered are listed, but only abnormal results are displayed) Labs Reviewed  CBC - Abnormal; Notable for the following components:      Result Value   WBC 11.8 (*)    All other components within normal limits  COMPREHENSIVE METABOLIC PANEL - Abnormal; Notable for the following components:   CO2 21 (*)    Glucose, Bld 147 (*)    BUN 41 (*)    Creatinine, Ser 1.72 (*)    Total Protein 8.3 (*)    Total Bilirubin 1.3 (*)    GFR, Estimated 40 (*)    All other components within normal limits  PROTIME-INR - Abnormal; Notable for the following components:   Prothrombin Time 24.7 (*)    INR 2.2 (*)    All other components within normal limits  BRAIN NATRIURETIC PEPTIDE  URINALYSIS, ROUTINE W REFLEX MICROSCOPIC    EKG   RADIOLOGY  I personally viewed and evaluated these images as part of my medical decision making, as well as reviewing the written report by the radiologist.  ED Provider Interpretation: right renal lesion noted  US RENAL  Result Date: 11/20/2022 CLINICAL DATA:  Gross hematuria. Exophytic renal lesion on noncontrast CT earlier today. EXAM: RENAL / URINARY TRACT ULTRASOUND COMPLETE COMPARISON:  CT stone study earlier today. FINDINGS: Right Kidney: Renal measurements: 10.1 x 4.3 x 4.3 cm = volume: 97 mL. Echogenicity within normal limits. 3.5 x 3.0 x 3.2 cm exophytic cyst is identified in the upper pole. This contains a single internal septation measuring up to 3 mm. Left Kidney: Renal measurements: 10.3 x 5.1 x 4.8 cm = volume: 131 mL. Echogenicity within normal limits. No mass or hydronephrosis  visualized. Bladder: As on CT scan earlier today, marked circumferential bladder wall thickening evident. Other: None. IMPRESSION: 1. 3.5 cm exophytic cyst in the upper pole of the right kidney contains a single internal septation. Septation measures up to 3 mm in thickness with a somewhat nodular character. 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 Follow-up renal ultrasound in 3-6 months recommended to ensure stability. 2. Marked circumferential bladder wall thickening. Infection/inflammation not excluded. Electronically Signed   By: Kennith Center M.D.   On: 11/20/2022 17:20   CT Renal Stone Study  Result Date: 11/20/2022 CLINICAL DATA:  Abdominal/flank pain, stone suspected gross hematuria EXAM: CT ABDOMEN AND PELVIS WITHOUT CONTRAST TECHNIQUE: Multidetector CT imaging of the abdomen and pelvis was performed following the standard protocol without IV contrast. RADIATION DOSE REDUCTION: This exam was performed according to the departmental dose-optimization program which includes automated exposure control, adjustment of the mA and/or kV according to patient size and/or use of iterative reconstruction technique. COMPARISON:  None Available. FINDINGS: Lower chest: There are patchy atelectatic changes in the visualized lung bases. No overt consolidation. No pleural effusion. The heart is normal in size. No pericardial effusion. Bilateral moderate-to-severe symmetric gynecomastia noted. Hepatobiliary: The liver is normal in size. Non-cirrhotic configuration. No suspicious mass. No intrahepatic or extrahepatic bile duct dilation. Gallbladder is surgically absent. Pancreas: Unremarkable. No pancreatic ductal dilatation or surrounding inflammatory changes. Spleen: Within normal limits. No focal lesion. Adrenals/Urinary Tract: Adrenal glands are unremarkable. No suspicious renal mass. There is a partially exophytic 2.3 x 3.6 cm hypoattenuating structure arising from the right kidney upper pole,  laterally. The structure exhibits internal CT attenuation of 24-25 Hounsfield units. There is a single, thin, incomplete calcified septum. This is incompletely characterized on the current examination but favored to represent a proteinaceous/hemorrhagic cyst. Further evaluation with nonemergent renal ultrasound is recommended. No hydronephrosis. No renal or ureteric calculi. The urinary bladder is minimally distended. However, there is significant uniform circumferential wall thickening measuring up to 1.8 cm. No perivesical fat stranding. Stomach/Bowel: No disproportionate dilation of the small or large bowel loops. No evidence of abnormal bowel wall thickening or inflammatory changes. The appendix is unremarkable. Vascular/Lymphatic: No ascites or pneumoperitoneum. No abdominal or pelvic lymphadenopathy, by size criteria. No aneurysmal dilation of the major abdominal arteries. There are mild peripheral atherosclerotic vascular calcifications of the aorta and its major branches. Reproductive: Enlarged prostate. Symmetric seminal vesicles. Other: The visualized soft tissues and abdominal  wall are unremarkable. Musculoskeletal: No suspicious osseous lesions. There are mild multilevel degenerative changes in the visualized spine. IMPRESSION: 1. No nephroureterolithiasis or obstructive uropathy. 2. Significant circumferential wall thickening of the urinary bladder. This may be secondary to chronic bladder outlet obstruction or cystitis. Correlate with urinalysis. 3. Enlarged prostate. 4. Incompletely characterized right renal lesion. Recommend renal ultrasound for further evaluation. Electronically Signed   By: Jules Schick M.D.   On: 11/20/2022 15:03     PROCEDURES:  Critical Care performed: No  Procedures   MEDICATIONS ORDERED IN ED: Medications  sodium chloride 0.9 % bolus 1,000 mL (0 mLs Intravenous Stopped 11/20/22 1720)     IMPRESSION / MDM / ASSESSMENT AND PLAN / ED COURSE  I reviewed the  triage vital signs and the nursing notes.                              Differential diagnosis includes, but is not limited to, malignancy, renal mass, nephrolithiasis, glomerulonephropathy  Patient's presentation is most consistent with acute complicated illness / injury requiring diagnostic workup.  Patient's diagnosis is consistent with gross hematuria, dehydration, and AKI.  Patient presents from home with HPI provided by the patient's wife who is his primary caregiver.  [name] endorses seeing bloodstained disposable briefs but this morning and last evening.  Patient presents in no acute distress, denies any recent fevers, chills, nausea vomiting, diarrhea.  Patient labs consistent with a mild leukocytosis at 11.8.  CMP reveals a BUN at 41 and a creatinine 1.72, which is above the patient's baseline of 12 and 1.01.  Patient also found to have a GFR decreased to 40 from his normal kidney function.  CT renal stone study reveals a right renal mass concerning for possible hemorrhagic cyst.  Follow-up renal ultrasound confirms an exophytic renal cyst.  Patient urine culture is pending at this time has he has been unable to provide a urine sample since presenting to the ED.  Fluid bolus has been provided and patient will be admitted to the hospital service for further management of his AKI and gross hematuria.  Patient along with his wife and family members at bedside have been made aware of the plan of care and agreeable at this time.  ----------------------------------------- 5:28 PM on 11/20/2022 ----------------------------------------- Dr. Huel Cote: Agrees admit patient to hospital service.  She is at bedside performing her H&P at this time.   FINAL CLINICAL IMPRESSION(S) / ED DIAGNOSES   Final diagnoses:  AKI (acute kidney injury) (HCC)  Gross hematuria     Rx / DC Orders   ED Discharge Orders     None        Note:  This document was prepared using Dragon voice recognition  software and may include unintentional dictation errors.    Lissa Hoard, PA-C 11/20/22 1729    Jene Every, MD 11/20/22 [phone]

## 2022-11-20 NOTE — Assessment & Plan Note (Addendum)
Patient is presenting with new onset hematuria and elevated WBC at 11.8.  Hematuria may be exacerbated by patient's use of Xarelto, however it cannot be held given recent CVA.  Strongly suspect underlying urinary tract infection given bladder wall thickening seen on CT; the urinalysis is still pending  - Start empiric ceftriaxone; discontinue if urinalysis does not show any evidence of infection - Urinalysis with reflex pending

## 2022-11-20 NOTE — Assessment & Plan Note (Signed)
Patient was recently admitted on 7/4 for acute CVA involving the right MCA, that was extensive nature.  Since then, patient's baseline functioning has significantly declined.  He went to a SNF for 1 week, but patient's family was unable to afford further rehabilitation and patient went home.  Per patient's wife, she is unable to help him with transfers and patient is no longer able to do it on his own.   - Continue home aspirin, Xarelto and statin - PT/OT

## 2022-11-20 NOTE — ED Notes (Signed)
MD aware of

## 2022-11-21 DIAGNOSIS — R310 Gross hematuria: Secondary | ICD-10-CM | POA: Diagnosis present

## 2022-11-21 DIAGNOSIS — I110 Hypertensive heart disease with heart failure: Secondary | ICD-10-CM | POA: Diagnosis present

## 2022-11-21 DIAGNOSIS — Z7984 Long term (current) use of oral hypoglycemic drugs: Secondary | ICD-10-CM | POA: Diagnosis not present

## 2022-11-21 DIAGNOSIS — Z8673 Personal history of transient ischemic attack (TIA), and cerebral infarction without residual deficits: Secondary | ICD-10-CM | POA: Diagnosis not present

## 2022-11-21 DIAGNOSIS — I639 Cerebral infarction, unspecified: Secondary | ICD-10-CM | POA: Diagnosis not present

## 2022-11-21 DIAGNOSIS — Z7901 Long term (current) use of anticoagulants: Secondary | ICD-10-CM | POA: Diagnosis not present

## 2022-11-21 DIAGNOSIS — Z89612 Acquired absence of left leg above knee: Secondary | ICD-10-CM | POA: Diagnosis not present

## 2022-11-21 DIAGNOSIS — R001 Bradycardia, unspecified: Secondary | ICD-10-CM | POA: Diagnosis present

## 2022-11-21 DIAGNOSIS — Z9841 Cataract extraction status, right eye: Secondary | ICD-10-CM | POA: Diagnosis not present

## 2022-11-21 DIAGNOSIS — Z961 Presence of intraocular lens: Secondary | ICD-10-CM | POA: Diagnosis present

## 2022-11-21 DIAGNOSIS — I5032 Chronic diastolic (congestive) heart failure: Secondary | ICD-10-CM | POA: Diagnosis present

## 2022-11-21 DIAGNOSIS — F17220 Nicotine dependence, chewing tobacco, uncomplicated: Secondary | ICD-10-CM | POA: Diagnosis present

## 2022-11-21 DIAGNOSIS — Z7189 Other specified counseling: Secondary | ICD-10-CM | POA: Diagnosis not present

## 2022-11-21 DIAGNOSIS — E1142 Type 2 diabetes mellitus with diabetic polyneuropathy: Secondary | ICD-10-CM | POA: Diagnosis present

## 2022-11-21 DIAGNOSIS — Z9842 Cataract extraction status, left eye: Secondary | ICD-10-CM | POA: Diagnosis not present

## 2022-11-21 DIAGNOSIS — E861 Hypovolemia: Secondary | ICD-10-CM | POA: Diagnosis present

## 2022-11-21 DIAGNOSIS — Z79899 Other long term (current) drug therapy: Secondary | ICD-10-CM | POA: Diagnosis not present

## 2022-11-21 DIAGNOSIS — Z89611 Acquired absence of right leg above knee: Secondary | ICD-10-CM | POA: Diagnosis not present

## 2022-11-21 DIAGNOSIS — Z66 Do not resuscitate: Secondary | ICD-10-CM | POA: Diagnosis present

## 2022-11-21 DIAGNOSIS — Z9181 History of falling: Secondary | ICD-10-CM | POA: Diagnosis not present

## 2022-11-21 DIAGNOSIS — E11649 Type 2 diabetes mellitus with hypoglycemia without coma: Secondary | ICD-10-CM | POA: Diagnosis present

## 2022-11-21 DIAGNOSIS — N179 Acute kidney failure, unspecified: Secondary | ICD-10-CM | POA: Diagnosis present

## 2022-11-21 DIAGNOSIS — Z9049 Acquired absence of other specified parts of digestive tract: Secondary | ICD-10-CM | POA: Diagnosis not present

## 2022-11-21 DIAGNOSIS — I4821 Permanent atrial fibrillation: Secondary | ICD-10-CM | POA: Diagnosis not present

## 2022-11-21 DIAGNOSIS — N400 Enlarged prostate without lower urinary tract symptoms: Secondary | ICD-10-CM | POA: Diagnosis present

## 2022-11-21 DIAGNOSIS — Z515 Encounter for palliative care: Secondary | ICD-10-CM | POA: Diagnosis not present

## 2022-11-21 DIAGNOSIS — I482 Chronic atrial fibrillation, unspecified: Secondary | ICD-10-CM | POA: Diagnosis present

## 2022-11-21 LAB — GLUCOSE, CAPILLARY
Glucose-Capillary: 49 mg/dL — ABNORMAL LOW (ref 70–99)
Glucose-Capillary: 98 mg/dL (ref 70–99)
Glucose-Capillary: 89 mg/dL (ref 70–99)
Glucose-Capillary: 166 mg/dL — ABNORMAL HIGH (ref 70–99)
Glucose-Capillary: 86 mg/dL (ref 70–99)

## 2022-11-21 MED ORDER — RIVAROXABAN 15 MG PO TABS
15.0000 mg | ORAL_TABLET | Freq: Every day | ORAL | Status: DC
  Administered 2022-11-21 – 2022-11-23 (×3): 15 mg via ORAL
  Filled 2022-11-21 (×5): qty 1

## 2022-11-21 MED ORDER — GERHARDT'S BUTT CREAM
TOPICAL_CREAM | Freq: Two times a day (BID) | CUTANEOUS | Status: DC
  Filled 2022-11-21: qty 1

## 2022-11-21 MED ORDER — SODIUM CHLORIDE 0.9 % IV SOLN: INTRAVENOUS | Status: AC

## 2022-11-21 MED ORDER — ATROPINE SULFATE 1 MG/10ML IJ SOSY
0.5000 mg | PREFILLED_SYRINGE | INTRAMUSCULAR | Status: AC
  Administered 2022-11-21: 0.5 mg via INTRAVENOUS
  Filled 2022-11-21: qty 10

## 2022-11-21 MED ORDER — DIGOXIN 125 MCG PO TABS: 0.1250 mg | ORAL_TABLET | Freq: Every day | ORAL | Status: DC

## 2022-11-21 NOTE — Progress Notes (Addendum)
PROGRESS NOTE   HPI was taken from Dr. Huel Cote:  [name] [name] is a 82 y.o. 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 Mrs. Delo states patient has had poor appetite over the last 3 days. Then over the last 1 day, she has noticed blood in his urine and briefs. She denies any fever, chills, vomiting, abdominal pain or diarrhea. Patient's brother in law states that patient is usually oriented only to self now and has become generally somnolent since his CVA. Patient is unable to participate in transferring and wife is unable to lift him to transfer. Mr. Geers went to a SNF for 1 week but family was unable to afford bill after the initial week.    ED course: On arrival to the ED, patient was normotensive at 161/116 with heart rate of 66.  He was saturating at 98% on room air.  He was afebrile at 97.  Initial workup notable for WBC of 11.8, bicarb 21, glucose 147, BUN 41, creatinine 1.72 with GFR 40.  INR 2.2. CT renal stone study without evidence of nephroureterolithiasis or obstructive uropathy, however significant circumferential wall thickening of the urinary bladder and enlarged prostate.  Urinalysis still pending.  Patient started on IV fluids.  [facility] contacted for admission.   [name] [name]  WUJ:[id number] DOB: 01-09-1941 DOA: 11/20/2022 PCP: Luciana Axe, NP    Assessment & Plan:   Principal Problem:   AKI (acute kidney injury) (HCC) Active Problems:   Hematuria   CVA (cerebral vascular accident) (HCC)   CHF (congestive heart failure) (HCC)   Atrial fibrillation (HCC)   Type 2 diabetes mellitus with peripheral neuropathy (HCC)   Essential  hypertension   Goals of care, counseling/discussion  Assessment and Plan: AKI: Cr is trending up slightly from day prior. Continue on IVFs x 1 day. Avoid nephrotoxic meds    Hematuria: started on IV rocephin w/o urine cx obtained. UA was unable to be read b/c urine pigment    Recent CVA: on 10/26/22. Continue on aspirin, statin & xarelto. Has had significant decline since this last CVA.    Chronic diastolic CHF: compensated. Holding home lasix, coreg, lisinopril. Monitor I/Os & daily weights   HTN: holding home coreg, lisinopril, lasix    DM2: well controlled, HbA1c 5.1. No need for SSI currently   Chronic a. fib: w/ slow ventricular response. S/p atropine x 1. Continue on xarelto. Holding digoxin      DVT prophylaxis: xarelto  Code Status: DNR Family Communication: Disposition Plan: depends on PT/OT recs   Level of care: Progressive Status is: Inpatient Remains inpatient appropriate because: severity of illness, requiring IVFs      Consultants:  Nephro   Procedures:  Antimicrobials: rocephin   Subjective: Pt c/o fatigue   Objective: Vitals:   11/21/22 0825 11/21/22 0827 11/21/22 1034 11/21/22 1237  BP: (!) 90/57   (!) 107/57  Pulse: 96 (!) 50 (!) 56 (!) 46  Resp: 12   14  Temp: 97.6 F (36.4 C)   97.6 F (36.4 C)  TempSrc:    Oral  SpO2: (!) 71% (!) 83% 90% 98%  Weight:      Height:        Intake/Output Summary (  Last 24 hours) at 11/21/2022 1356 Last data filed at 11/21/2022 1029 Gross per 24 hour  Intake 465.05 ml  Output --  Net 465.05 ml   Filed Weights   11-27-2022 1041 11-27-2022 1245 11/27/22 2217  Weight: 88.9 kg 88.9 kg 61.8 kg    Examination:  General exam: Appears calm and comfortable. Frail appearing  Respiratory system: Clear to auscultation. Respiratory effort normal. Cardiovascular system: S1 & S2 +. No rubs, gallops or clicks.  Gastrointestinal system: Abdomen is nondistended, soft and nontender.Normal bowel sounds heard. Central  nervous system: Alert and awake Psychiatry: Judgement and insight appears poor. Flat mood and affect      Data Reviewed: I have personally reviewed following labs and imaging studies  CBC: Recent Labs  Lab 11-27-2022 1239 11/21/22 0503  WBC 11.8* 8.5  HGB 15.2 13.8  HCT 42.9 38.8*  MCV 81.6 82.0  PLT 210 156   Basic Metabolic Panel: Recent Labs  Lab November 27, 2022 1239 11/21/22 0503  NA 140 138  K 4.5 4.2  CL 104 105  CO2 21* 24  GLUCOSE 147* 79  BUN 41* 47*  CREATININE 1.72* 1.74*  CALCIUM 9.9 9.0   GFR: Estimated Creatinine Clearance: 29.6 mL/min (A) (by C-G formula based on SCr of 1.74 mg/dL (H)). Liver Function Tests: Recent Labs  Lab 11/27/2022 1239  AST 25  ALT 15  ALKPHOS 78  BILITOT 1.3*  PROT 8.3*  ALBUMIN 3.6   No results for input(s): "LIPASE", "AMYLASE" in the last 168 hours. No results for input(s): "AMMONIA" in the last 168 hours. Coagulation Profile: Recent Labs  Lab 2022/11/27 1239  INR 2.2*   Cardiac Enzymes: No results for input(s): "CKTOTAL", "CKMB", "CKMBINDEX", "TROPONINI" in the last 168 hours. BNP (last 3 results) No results for input(s): "PROBNP" in the last 8760 hours. HbA1C: No results for input(s): "HGBA1C" in the last 72 hours. CBG: Recent Labs  Lab 2022-11-27 2240 11/27/22 2300 11/21/22 0744 11/21/22 0849 11/21/22 1236  GLUCAP 64* 81 49* 98 89   Lipid Profile: No results for input(s): "CHOL", "HDL", "LDLCALC", "TRIG", "CHOLHDL", "LDLDIRECT" in the last 72 hours. Thyroid Function Tests: No results for input(s): "TSH", "T4TOTAL", "FREET4", "T3FREE", "THYROIDAB" in the last 72 hours. Anemia Panel: No results for input(s): "VITAMINB12", "FOLATE", "FERRITIN", "TIBC", "IRON", "RETICCTPCT" in the last 72 hours. Sepsis Labs: No results for input(s): "PROCALCITON", "LATICACIDVEN" in the last 168 hours.  No results found for this or any previous visit (from the past 240 hour(s)).       Radiology Studies: US RENAL  Result  Date: 11/27/2022 CLINICAL DATA:  Gross hematuria. Exophytic renal lesion on noncontrast CT earlier today. EXAM: RENAL / URINARY TRACT ULTRASOUND COMPLETE COMPARISON:  CT stone study earlier today. FINDINGS: Right Kidney: Renal measurements: 10.1 x 4.3 x 4.3 cm = volume: 97 mL. Echogenicity within normal limits. 3.5 x 3.0 x 3.2 cm exophytic cyst is identified in the upper pole. This contains a single internal septation measuring up to 3 mm. Left Kidney: Renal measurements: 10.3 x 5.1 x 4.8 cm = volume: 131 mL. Echogenicity within normal limits. No mass or hydronephrosis visualized. Bladder: As on CT scan earlier today, marked circumferential bladder wall thickening evident. Other: None. IMPRESSION: 1. 3.5 cm exophytic cyst in the upper pole of the right kidney contains a single internal septation. Septation measures up to 3 mm in thickness with a somewhat nodular character. 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 Follow-up renal ultrasound in 3-6 months recommended to ensure  stability. 2. Marked circumferential bladder wall thickening. Infection/inflammation not excluded. Electronically Signed   By: Kennith Center M.D.   On: 11/20/2022 17:20   CT Renal Stone Study  Result Date: 11/20/2022 CLINICAL DATA:  Abdominal/flank pain, stone suspected gross hematuria EXAM: CT ABDOMEN AND PELVIS WITHOUT CONTRAST TECHNIQUE: Multidetector CT imaging of the abdomen and pelvis was performed following the standard protocol without IV contrast. RADIATION DOSE REDUCTION: This exam was performed according to the departmental dose-optimization program which includes automated exposure control, adjustment of the mA and/or kV according to patient size and/or use of iterative reconstruction technique. COMPARISON:  None Available. FINDINGS: Lower chest: There are patchy atelectatic changes in the visualized lung bases. No overt consolidation. No pleural effusion. The heart is normal in size. No pericardial effusion.  Bilateral moderate-to-severe symmetric gynecomastia noted. Hepatobiliary: The liver is normal in size. Non-cirrhotic configuration. No suspicious mass. No intrahepatic or extrahepatic bile duct dilation. Gallbladder is surgically absent. Pancreas: Unremarkable. No pancreatic ductal dilatation or surrounding inflammatory changes. Spleen: Within normal limits. No focal lesion. Adrenals/Urinary Tract: Adrenal glands are unremarkable. No suspicious renal mass. There is a partially exophytic 2.3 x 3.6 cm hypoattenuating structure arising from the right kidney upper pole, laterally. The structure exhibits internal CT attenuation of 24-25 Hounsfield units. There is a single, thin, incomplete calcified septum. This is incompletely characterized on the current examination but favored to represent a proteinaceous/hemorrhagic cyst. Further evaluation with nonemergent renal ultrasound is recommended. No hydronephrosis. No renal or ureteric calculi. The urinary bladder is minimally distended. However, there is significant uniform circumferential wall thickening measuring up to 1.8 cm. No perivesical fat stranding. Stomach/Bowel: No disproportionate dilation of the small or large bowel loops. No evidence of abnormal bowel wall thickening or inflammatory changes. The appendix is unremarkable. Vascular/Lymphatic: No ascites or pneumoperitoneum. No abdominal or pelvic lymphadenopathy, by size criteria. No aneurysmal dilation of the major abdominal arteries. There are mild peripheral atherosclerotic vascular calcifications of the aorta and its major branches. Reproductive: Enlarged prostate. Symmetric seminal vesicles. Other: The visualized soft tissues and abdominal wall are unremarkable. Musculoskeletal: No suspicious osseous lesions. There are mild multilevel degenerative changes in the visualized spine. IMPRESSION: 1. No nephroureterolithiasis or obstructive uropathy. 2. Significant circumferential wall thickening of the urinary  bladder. This may be secondary to chronic bladder outlet obstruction or cystitis. Correlate with urinalysis. 3. Enlarged prostate. 4. Incompletely characterized right renal lesion. Recommend renal ultrasound for further evaluation. Electronically Signed   By: Jules Schick M.D.   On: 11/20/2022 15:03        Scheduled Meds:  aspirin EC  81 mg Oral Daily   atorvastatin  10 mg Oral Daily   cyanocobalamin  1,000 mcg Oral Daily   ferrous sulfate  325 mg Oral Q breakfast   folic acid  1 mg Oral Daily   gabapentin  100 mg Oral TID   Gerhardt's butt cream   Topical BID   multivitamin with minerals  1 tablet Oral Daily   rivaroxaban  15 mg Oral Q supper   sodium chloride flush  3 mL Intravenous Q12H   thiamine  100 mg Oral Daily   Continuous Infusions:  sodium chloride 60 mL/hr at 11/21/22 1028   cefTRIAXone (ROCEPHIN)  IV Stopped (11/20/22 1908)     LOS: 0 days    Time spent: 35 mins     Charise Killian, MD [facility] Pager [phone]  If 7PM-7AM, please contact night-coverage [email or web address] 11/21/2022, 1:56 PM

## 2022-11-21 NOTE — Inpatient Diabetes Management (Cosign Needed)
Inpatient Diabetes Program Recommendations  AACE/ADA: New Consensus Statement on Inpatient Glycemic Control  Target Ranges:  Prepandial:   less than 140 mg/dL      Peak postprandial:   less than 180 mg/dL (1-2 hours)      Critically ill patients:  140 - 180 mg/dL    Latest Reference Range & Units 11/21/22 07:44 11/21/22 08:49  Glucose-Capillary 70 - 99 mg/dL 49 (L) 98    Latest Reference Range & Units 11/20/22 22:24 11/20/22 22:40 11/20/22 23:00  Glucose-Capillary 70 - 99 mg/dL 42 (LL) 64 (L) 81    Latest Reference Range & Units 10/26/22 11:36  Hemoglobin A1C 4.8 - 5.6 % 5.1   Review of Glycemic Control  Diabetes history: DM2 Outpatient Diabetes medications: Metformin 1000 mg daily Current orders for Inpatient glycemic control: CBGs  Inpatient Diabetes Program Recommendations:    Oral DM medication: May want to consider discontinuing Metformin as an outpatient.  HbgA1C:  A1C 5.1% on 10/26/22 indicating an average glucose of 100 mg/dl over the past 2-3 months.   Thanks, Orlando Penner, RN, MSN, CDCES Diabetes Coordinator Inpatient Diabetes Program [phone] (Team Pager from 8am to 5pm)

## 2022-11-21 NOTE — Significant Event (Signed)
HR sustained less than 40 with dips into the 20s. Atropine 0.5mg  IV push x1 ordered and given per Dr. Arville Care. HR 50s-70s post atropine IV push.

## 2022-11-21 NOTE — TOC Initial Note (Signed)
Transition of Care [facility]) - Initial/Assessment Note    Patient Details  Name: [name] [name] MRN: [id number] Date of Birth: 1941-01-05  Transition of Care [facility]) CM/SW Contact:    Darolyn Rua, LCSW Phone Number: 11/21/2022, 10:11 AM  Clinical Narrative:                  CSW notes that patient was previously at [facility] for 1 week, per chart review they were unable to afford bill after 1 week and was discharge home. Patient currently presenting from home with wife for complaints of blood in urine.  Per Revonda Standard with [facility] she reports that patient was set up with [facility] home health services at time of discharge.   PCP: Laren Everts, NP is PCP,  Pharmacy: [facility] for medications.  DME: Has hospital bed, wheelchair, and bedside commode in the home.   Please consult toc should discharge planning needs arise.    Expected Discharge Plan: Home w Home Health Services Barriers to Discharge: Continued Medical Work up   Patient Goals and CMS Choice Patient states their goals for this hospitalization and ongoing recovery are:: to go home CMS Medicare.gov Compare Post Acute Care list provided to:: Patient Choice offered to / list presented to : Patient      Expected Discharge Plan and Services                                              Prior Living Arrangements/Services     Patient language and need for interpreter reviewed:: Yes        Need for Family Participation in Patient Care: Yes (Comment) Care giver support system in place?: Yes (comment)   Criminal Activity/Legal Involvement Pertinent to Current Situation/Hospitalization: No - Comment as needed  Activities of Daily Living      Permission Sought/Granted                  Emotional Assessment              Admission diagnosis:  Gross hematuria [R31.0] AKI (acute kidney injury) (HCC) [N17.9] Patient Active Problem List   Diagnosis Date Noted    Hematuria 11/20/2022   Goals of care, counseling/discussion 11/20/2022   Spinal stenosis in cervical region 10/27/2022   Spondylosis, cervical, 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   CVA (cerebral vascular accident) (HCC) 10/26/2022   Sepsis due to undetermined organism (HCC) 08/10/2022   Cellulitis of second toe of left foot 08/10/2022   Generalized weakness 08/10/2022   Sepsis (HCC) 08/10/2022   Gangrene (HCC) 08/10/2022   AKI (acute kidney injury) (HCC) 08/10/2022   Hx of AKA (above knee amputation), 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   Acute blood loss anemia    Anemia of chronic disease    Leukocytosis    Chronic diastolic CHF (congestive heart failure) (HCC)    Moderate malnutrition (HCC) 03/11/2021   Acute osteomyelitis of right ankle (HCC) 03/08/2021   Open wound of right ankle 03/08/2021   Lactic acidosis 03/08/2021   Hypoalbuminemia due to protein-calorie malnutrition (HCC) 03/08/2021   Hyperglycemia due to diabetes mellitus (HCC) 03/08/2021   Hyponatremia 03/08/2021   Mixed hyperlipidemia 03/08/2021   Peripheral arterial disease (HCC) 03/08/2021   CHF (congestive heart failure) (HCC) 02/02/2021   Type 2 diabetes mellitus with peripheral neuropathy (HCC) 02/02/2021   Right leg weakness 11/19/2019   Vitamin B12  deficiency 05/20/2018   Vitamin D deficiency 05/20/2018   Cardiomyopathy (HCC) 07/29/2013   Asthma 02/19/2012   Essential hypertension 02/19/2012   Atrial fibrillation (HCC) 10/27/2011   GERD (gastroesophageal reflux disease) 10/27/2011   Dyslipidemia 10/27/2011   PCP:  Luciana Axe, NP Pharmacy:   [facility] [place], [place] - [address] [place] [address] Phone: [phone] Fax: [phone]     Social Determinants of Health (SDOH) Social History: SDOH Screenings   Food Insecurity: No Food Insecurity (10/26/2022)  Housing: Low Risk  (10/26/2022)  Transportation Needs: No Transportation Needs (10/26/2022)  Utilities: Not At Risk  (10/26/2022)  Tobacco Use: High Risk (11/20/2022)   SDOH Interventions:     Readmission Risk Interventions     No data to display

## 2022-11-21 NOTE — Consult Note (Signed)
WOC Nurse Consult Note: Reason for Consult: sacrum Upon inspection discovered fissure in the base of the gluteal cleft Wound type: partial thickness skin loss with fissure Pressure Injury POA: NA Measurement: 4cm x 0.2cm x 0.1cm Wound bed: 100% pink Drainage (amount, consistency, odor) none Periwound: intact  Dressing: Continue silicone foam making sure it goes deep into the gluteal cleft to absorb moisture.  Apply Gerhardt's butt cream BID  Discussed POC with patient and bedside nurse.  Re consult if needed, will not follow at this time. Thanks  Suhayla Chisom [employer], RN,CWOCN, CNS, CWON-AP [phone])

## 2022-11-21 NOTE — Consult Note (Signed)
I have placed a request via Secure Chat to Dr.Williams requesting photos of the wound areas of concern to be placed in the EMR.     Limited WOC nursing coverage on the [facility] campus, images allow for more timely and efficient consultation.   [name] [name], CNS, [employer] [phone]

## 2022-11-21 NOTE — Plan of Care (Signed)
  Problem: Clinical Measurements: Goal: Will remain free from infection Outcome: Progressing Goal: Diagnostic test results will improve Outcome: Progressing   Problem: Activity: Goal: Risk for activity intolerance will decrease Outcome: Progressing   Problem: Nutrition: Goal: Adequate nutrition will be maintained Outcome: Progressing

## 2022-11-22 DIAGNOSIS — Z7189 Other specified counseling: Secondary | ICD-10-CM | POA: Diagnosis not present

## 2022-11-22 DIAGNOSIS — R310 Gross hematuria: Secondary | ICD-10-CM | POA: Diagnosis not present

## 2022-11-22 DIAGNOSIS — N179 Acute kidney failure, unspecified: Secondary | ICD-10-CM | POA: Diagnosis not present

## 2022-11-22 DIAGNOSIS — I4821 Permanent atrial fibrillation: Secondary | ICD-10-CM | POA: Diagnosis not present

## 2022-11-22 LAB — GLUCOSE, CAPILLARY
Glucose-Capillary: 110 mg/dL — ABNORMAL HIGH (ref 70–99)
Glucose-Capillary: 139 mg/dL — ABNORMAL HIGH (ref 70–99)
Glucose-Capillary: 89 mg/dL (ref 70–99)

## 2022-11-22 MED ORDER — ATROPINE SULFATE 1 MG/10ML IJ SOSY: 0.5000 mg | PREFILLED_SYRINGE | INTRAMUSCULAR | Status: DC | PRN

## 2022-11-22 NOTE — Progress Notes (Signed)
PROGRESS NOTE   HPI was taken from Dr. Huel Cote:  [name] [name] is a 82 y.o. 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 Mrs. Stoffers states patient has had poor appetite over the last 3 days. Then over the last 1 day, she has noticed blood in his urine and briefs. She denies any fever, chills, vomiting, abdominal pain or diarrhea. Patient's brother in law states that patient is usually oriented only to self now and has become generally somnolent since his CVA. Patient is unable to participate in transferring and wife is unable to lift him to transfer. Mr. Stawski went to a SNF for 1 week but family was unable to afford bill after the initial week.    ED course: On arrival to the ED, patient was normotensive at 161/116 with heart rate of 66.  He was saturating at 98% on room air.  He was afebrile at 97.  Initial workup notable for WBC of 11.8, bicarb 21, glucose 147, BUN 41, creatinine 1.72 with GFR 40.  INR 2.2. CT renal stone study without evidence of nephroureterolithiasis or obstructive uropathy, however significant circumferential wall thickening of the urinary bladder and enlarged prostate.  Urinalysis still pending.  Patient started on IV fluids.  TRH contacted for admission.  7/31: Palliative care consult, PT and OT recommends home health  Lizbeth Wodrich  ZOX:[id number] DOB: 1941-04-09 DOA: 11/20/2022 PCP: Luciana Axe, NP    Assessment & Plan:   Principal Problem:   AKI (acute kidney injury) (HCC) Active Problems:   Hematuria   CVA (cerebral vascular accident) (HCC)   CHF (congestive heart failure) (HCC)   Atrial fibrillation (HCC)   Type 2  diabetes mellitus with peripheral neuropathy (HCC)   Essential hypertension   Goals of care, counseling/discussion  Assessment and Plan: AKI: Cr is trending up slightly from day prior. Continue on IVFs x 1 day. Avoid nephrotoxic meds.  Recheck kidney function   Hematuria: Continue IV rocephin w/o urine cx obtained. UA was unable to be read b/c urine pigment    Recent CVA: on 10/26/22. Continue on aspirin, statin & xarelto. Has had significant decline since this last CVA.    Chronic diastolic CHF: compensated. Holding home lasix, coreg, lisinopril. Monitor I/Os & daily weights   HTN: holding home coreg, lisinopril, lasix    DM2: well controlled, HbA1c 5.1. No need for SSI currently   Chronic a. fib: w/ slow ventricular response. S/p atropine x 1. Continue on xarelto. Holding digoxin      DVT prophylaxis: xarelto  Code Status: DNR Family Communication: Tried calling both numbers available in [facility] for family but none answered Disposition Plan: Home with home health  Level of care: Progressive Status is: Inpatient Remains inpatient appropriate because: severity of illness, requiring IVFs      Consultants:  Nephro  Palliative care  Procedures:  Antimicrobials: rocephin   Subjective: Pt c/o fatigue, no new issues.  Objective: Vitals:   11/22/22 0351 11/22/22 0838 11/22/22 1212 11/22/22 1546  BP: 114/78 114/62 135/73 (!) 140/103  Pulse: (!) 58 (!) 55 (!) 54 76  Resp: 16 16 16 16   Temp: 97.8 F (36.6 C) (!) 97.5 F (36.4 C) (!) 97.4 F (36.3 C) 98  F (36.7 C)  TempSrc: Oral     SpO2: 98% 93% 100% 100%  Weight:      Height:        Intake/Output Summary (Last 24 hours) at 11/22/2022 1606 Last data filed at 11/22/2022 1427 Gross per 24 hour  Intake 2125.81 ml  Output 750 ml  Net 1375.81 ml   Filed Weights   11/20/22 1245 11/20/22 2217 11/22/22 0351  Weight: 88.9 kg 61.8 kg 63.9 kg    Examination:  General exam: Appears calm and comfortable. Frail appearing   Respiratory system: Clear to auscultation. Respiratory effort normal. Cardiovascular system: S1 & S2 +. No rubs, gallops or clicks.  Gastrointestinal system: Abdomen is nondistended, soft and nontender.Normal bowel sounds heard. Central nervous system: Alert and awake Psychiatry: Judgement and insight appears poor. Flat mood and affect      Data Reviewed: I have personally reviewed following labs and imaging studies  CBC: Recent Labs  Lab 11/20/22 1239 11/21/22 0503  WBC 11.8* 8.5  HGB 15.2 13.8  HCT 42.9 38.8*  MCV 81.6 82.0  PLT 210 156   Basic Metabolic Panel: Recent Labs  Lab 11/20/22 1239 11/21/22 0503  NA 140 138  K 4.5 4.2  CL 104 105  CO2 21* 24  GLUCOSE 147* 79  BUN 41* 47*  CREATININE 1.72* 1.74*  CALCIUM 9.9 9.0   GFR: Estimated Creatinine Clearance: 30.6 mL/min (A) (by C-G formula based on SCr of 1.74 mg/dL (H)). Liver Function Tests: Recent Labs  Lab 11/20/22 1239  AST 25  ALT 15  ALKPHOS 78  BILITOT 1.3*  PROT 8.3*  ALBUMIN 3.6   No results for input(s): "LIPASE", "AMYLASE" in the last 168 hours. No results for input(s): "AMMONIA" in the last 168 hours. Coagulation Profile: Recent Labs  Lab 11/20/22 1239  INR 2.2*    CBG: Recent Labs  Lab 11/21/22 1705 11/21/22 2036 11/22/22 0843 11/22/22 1215 11/22/22 1547  GLUCAP 166* 86 110* 139* 89         Radiology Studies: US RENAL  Result Date: 11/20/2022 CLINICAL DATA:  Gross hematuria. Exophytic renal lesion on noncontrast CT earlier today. EXAM: RENAL / URINARY TRACT ULTRASOUND COMPLETE COMPARISON:  CT stone study earlier today. FINDINGS: Right Kidney: Renal measurements: 10.1 x 4.3 x 4.3 cm = volume: 97 mL. Echogenicity within normal limits. 3.5 x 3.0 x 3.2 cm exophytic cyst is identified in the upper pole. This contains a single internal septation measuring up to 3 mm. Left Kidney: Renal measurements: 10.3 x 5.1 x 4.8 cm = volume: 131 mL. Echogenicity within normal limits. No mass  or hydronephrosis visualized. Bladder: As on CT scan earlier today, marked circumferential bladder wall thickening evident. Other: None. IMPRESSION: 1. 3.5 cm exophytic cyst in the upper pole of the right kidney contains a single internal septation. Septation measures up to 3 mm in thickness with a somewhat nodular character. 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 Follow-up renal ultrasound in 3-6 months recommended to ensure stability. 2. Marked circumferential bladder wall thickening. Infection/inflammation not excluded. Electronically Signed   By: Kennith Center M.D.   On: 11/20/2022 17:20        Scheduled Meds:  aspirin EC  81 mg Oral Daily   atorvastatin  10 mg Oral Daily   cyanocobalamin  1,000 mcg Oral Daily   ferrous sulfate  325 mg Oral Q breakfast   folic acid  1 mg Oral Daily   gabapentin  100 mg Oral TID  Gerhardt's butt cream   Topical BID   multivitamin with minerals  1 tablet Oral Daily   rivaroxaban  15 mg Oral Q supper   sodium chloride flush  3 mL Intravenous Q12H   thiamine  100 mg Oral Daily   Continuous Infusions:  cefTRIAXone (ROCEPHIN)  IV Stopped (11/21/22 1750)     LOS: 1 day    Time spent: 35 mins     Shondra Capps Sherryll Burger, MD [facility] Pager [phone]  If 7PM-7AM, please contact night-coverage [email or web address] 11/22/2022, 4:06 PM

## 2022-11-22 NOTE — Evaluation (Signed)
Physical Therapy Evaluation Patient Details Name: [name] [name] MRN: [id number] DOB: Jun 13, 1940 Today's Date: 11/22/2022  History of Present Illness  Patient is a 82 y.o. 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 MD assessment also includes: AKI, recent CVA, and Chronic a. fib: w/ slow ventricular response.  Clinical Impression  Pt was pleasant and motivated to participate during the session and put forth good effort throughout. Pt responds to commands well, able to sit EOB and perform directional weight shifts with mod/max A. Pt is presenting close to post-CVA baseline. Unable to attempt further bed mobility or transfers due to unsafe seated balance. Pt will benefit from continued PT services upon discharge to safely address deficits listed in patient problem list for decreased caregiver assistance and eventual return to PLOF.       If plan is discharge home, recommend the following: Two people to help with walking and/or transfers;Two people to help with bathing/dressing/bathroom;Assistance with cooking/housework;Assist for transportation;Assistance with feeding;Help with stairs or ramp for entrance   Can travel by private vehicle   No    Equipment Recommendations Other (comment) Youth worker)  Recommendations for Other Services       Functional Status Assessment Patient has had a recent decline in their functional status and/or demonstrates limited ability to make significant improvements in function in a reasonable and predictable amount of time     Precautions / Restrictions Precautions Precautions: Fall Restrictions Weight Bearing Restrictions: No Other Position/Activity Restrictions: BLE AKA      Mobility  Bed Mobility Overal bed  mobility: Needs Assistance Bed Mobility: Supine to Sit, Sit to Supine     Supine to sit: Mod assist, HOB elevated, Max assist Sit to supine: Max assist, Mod assist   General bed mobility comments: Initial Max A progressed to Mod A for seataed balance; pt using BUE for support    Transfers                   General transfer comment: Unable to transfer; pt unsafe and dependent with seated balance; requiring cues and mod assist throughout.    Ambulation/Gait                  Stairs            Wheelchair Mobility     Tilt Bed    Modified Rankin (Stroke Patients Only)       Balance Overall balance assessment: Needs assistance Sitting-balance support: Bilateral upper extremity supported Sitting balance-Leahy Scale: Poor                                       Pertinent Vitals/Pain Pain Assessment Pain Assessment: No/denies pain    Home Living Family/patient expects to be discharged to:: Private residence Living Arrangements: Spouse/significant other;Children Available Help at Discharge: Family;Available 24 hours/day Type of Home: House Home Access: Ramped entrance       Home Layout: One level Home Equipment: Agricultural consultant (2 wheels);BSC/3in1;Hospital bed;Wheelchair - power;Wheelchair - manual Additional Comments: Pt has power chair, pt lives with spouse, son, and 2 granddaughters.    Prior Function Prior Level of Function : Needs assist       Physical Assist : Mobility (physical);ADLs (physical) Mobility (physical): Transfers  ADLs (physical): Bathing;Toileting;IADLs;Dressing;Grooming Mobility Comments: Pt was transferring AP/PA into wheelchair or power chair prior to CVA 10/26/22 but since them has been bed level. ADLs Comments: Pt needing assistance from family for all self care tasks from bed level.     Hand Dominance   Dominant Hand: Right    Extremity/Trunk Assessment   Upper Extremity Assessment Upper Extremity  Assessment: Generalized weakness LUE Deficits / Details: Pt observed to be self feeding with use of B UE but decreased fine motor coordination and overall strength    Lower Extremity Assessment Lower Extremity Assessment: Defer to PT evaluation       Communication   Communication: HOH  Cognition Arousal/Alertness: Awake/alert Behavior During Therapy: WFL for tasks assessed/performed Overall Cognitive Status: Within Functional Limits for tasks assessed                                          General Comments      Exercises Other Exercises Other Exercises: Seated lateral and AP weight shifts requiring mod A with intermittent BUE support, able to follow commands for dirtections Other Exercises: Attempted lateral scooting, pt unable to maintain seated balance while applying downwards force to bed   Assessment/Plan    PT Assessment Patient needs continued PT services  PT Problem List Decreased strength;Decreased activity tolerance;Decreased balance;Decreased mobility;Decreased range of motion       PT Treatment Interventions DME instruction;Functional mobility training;Therapeutic activities;Therapeutic exercise;Balance training;Neuromuscular re-education;Patient/family education    PT Goals (Current goals can be found in the Care Plan section)  Acute Rehab PT Goals Patient Stated Goal: Get back home; continue working with [facility] PT Goal Formulation: With patient/family Time For Goal Achievement: 12/05/22 Potential to Achieve Goals: Fair    Frequency Min 1X/week     Co-evaluation               AM-PAC PT "6 Clicks" Mobility  Outcome Measure Help needed turning from your back to your side while in a flat bed without using bedrails?: Total Help needed moving from lying on your back to sitting on the side of a flat bed without using bedrails?: Total Help needed moving to and from a bed to a chair (including a wheelchair)?: Total Help needed standing up  from a chair using your arms (e.g., wheelchair or bedside chair)?: Total Help needed to walk in hospital room?: Total Help needed climbing 3-5 steps with a railing? : Total 6 Click Score: 6    End of Session   Activity Tolerance: Patient tolerated treatment well Patient left: in bed;with call bell/phone within reach;with bed alarm set;with family/visitor present;with chair alarm set Nurse Communication: Mobility status;Other (comment) (Possibility for trapeze bar) PT Visit Diagnosis: Other abnormalities of gait and mobility (R26.89);Muscle weakness (generalized) (M62.81)    Time: 4782-9562 PT Time Calculation (min) (ACUTE ONLY): 33 min   Charges:                 Cecile Sheerer, SPT 11/22/22, 1:46 PM

## 2022-11-22 NOTE — Plan of Care (Signed)
Problem: Ischemic Stroke/TIA Tissue Perfusion: Goal: Complications of ischemic stroke/TIA will be minimized Outcome: Progressing   Problem: Coping: Goal: Will verbalize positive feelings about self Outcome: Progressing Goal: Will identify appropriate support needs Outcome: Progressing   Problem: Nutrition: Goal: Risk of aspiration will decrease Outcome: Progressing Goal: Dietary intake will improve Outcome: Progressing   Problem: Coping: Goal: Ability to adjust to condition or change in health will improve Outcome: Progressing   Problem: Fluid Volume: Goal: Ability to maintain a balanced intake and output will improve Outcome: Progressing   Problem: Metabolic: Goal: Ability to maintain appropriate glucose levels will improve Outcome: Progressing   Problem: Nutritional: Goal: Maintenance of adequate nutrition will improve Outcome: Progressing Goal: Progress toward achieving an optimal weight will improve Outcome: Progressing   Problem: Skin Integrity: Goal: Risk for impaired skin integrity will decrease Outcome: Progressing   Problem: Tissue Perfusion: Goal: Adequacy of tissue perfusion will improve Outcome: Progressing   Problem: Clinical Measurements: Goal: Ability to maintain clinical measurements within normal limits will improve Outcome: Progressing Goal: Will remain free from infection Outcome: Progressing Goal: Diagnostic test results will improve Outcome: Progressing Goal: Respiratory complications will improve Outcome: Progressing Goal: Cardiovascular complication will be avoided Outcome: Progressing   Problem: Activity: Goal: Risk for activity intolerance will decrease Outcome: Progressing   Problem: Nutrition: Goal: Adequate nutrition will be maintained Outcome: Progressing   Problem: Coping: Goal: Level of anxiety will decrease Outcome: Progressing   Problem: Elimination: Goal: Will not experience complications related to bowel  motility Outcome: Progressing Goal: Will not experience complications related to urinary retention Outcome: Progressing   Problem: Pain Managment: Goal: General experience of comfort will improve Outcome: Progressing   Problem: Safety: Goal: Ability to remain free from injury will improve Outcome: Progressing   Problem: Skin Integrity: Goal: Risk for impaired skin integrity will decrease Outcome: Progressing   Problem: Education: Goal: Knowledge of disease or condition will improve Outcome: Not Progressing Note: Patient experiencing confusion. Will educate and reorient as needed. Goal: Knowledge of secondary prevention will improve (MUST DOCUMENT ALL) Outcome: Not Progressing Note: Patient experiencing confusion. Will educate and reorient as needed. Goal: Knowledge of patient specific risk factors will improve Loraine Leriche N/A or DELETE if not current risk factor) Outcome: Not Progressing Note: Patient experiencing confusion. Will educate and reorient as needed.   Problem: Health Behavior/Discharge Planning: Goal: Ability to manage health-related needs will improve Outcome: Not Progressing Note: Patient experiencing confusion. Will educate and reorient as needed. Goal: Goals will be collaboratively established with patient/family Outcome: Not Progressing Note: Patient experiencing confusion. Will educate and reorient as needed.   Problem: Self-Care: Goal: Ability to participate in self-care as condition permits will improve Outcome: Not Progressing Note: Patient experiencing confusion. Will educate and reorient as needed. Goal: Verbalization of feelings and concerns over difficulty with self-care will improve Outcome: Not Progressing Note: Patient experiencing confusion. Will educate and reorient as needed. Goal: Ability to communicate needs accurately will improve Outcome: Not Progressing Note: Patient experiencing confusion. Will educate and reorient as needed.   Problem:  Education: Goal: Ability to describe self-care measures that may prevent or decrease complications (Diabetes Survival Skills Education) will improve Outcome: Not Progressing Note: Patient experiencing confusion. Will educate and reorient as needed. Goal: Individualized Educational Video(s) Outcome: Not Progressing Note: Patient experiencing confusion. Will educate and reorient as needed.   Problem: Health Behavior/Discharge Planning: Goal: Ability to identify and utilize available resources and services will improve Outcome: Not Progressing Note: Patient experiencing confusion. Will educate and reorient as  needed. Goal: Ability to manage health-related needs will improve Outcome: Not Progressing Note: Patient experiencing confusion. Will educate and reorient as needed.   Problem: Education: Goal: Knowledge of General Education information will improve Description: Including pain rating scale, medication(s)/side effects and non-pharmacologic comfort measures Outcome: Not Progressing Note: Patient experiencing confusion. Will educate and reorient as needed.   Problem: Health Behavior/Discharge Planning: Goal: Ability to manage health-related needs will improve Outcome: Not Progressing Note: Patient experiencing confusion. Will educate and reorient as needed.

## 2022-11-22 NOTE — Consult Note (Signed)
Consultation Note Date: 11/22/2022   Patient Name: [name] [name]  DOB: 05-30-1940  MRN: [id number]  Age / Sex: 82 y.o., male  PCP: [name] Axe, NP Referring Physician: Delfino Lovett, MD  Reason for Consultation: Establishing goals of care  HPI/Patient Profile:  [name] [name] is a 82 y.o. 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 Mrs. [name] [name] states patient has had poor appetite over the last 3 days. Then over the last 1 day, she has noticed blood in his urine and briefs. She denies any fever, chills, vomiting, abdominal pain or diarrhea. Patient's brother in law states that patient is usually oriented only to self now and has become generally somnolent since his CVA. Patient is unable to participate in transferring and wife is unable to lift him to transfer. Mr. [name] [name] went to a SNF for 1 week but family was unable to afford bill after the initial week.   Clinical Assessment and Goals of Care: Notes and labs reviewed.  In to see patient.  Patient is resting in bed with wife at bedside.  Patient does not really speak during visit.  Patient has just finished working with patient, and plans are in place to discharge home.  Wife discusses his stroke earlier this month.  She states he went to rehab for around a week, but she took him home as she was going to have to start paying out-of-pocket, and did not feel that the care  he was receiving was reflective of the cost.  Wife states at baseline prior to the stroke, patient was able to scoot himself to wheelchair, to bed, and to  lift chair.   She states while at home since rehab, he has not been able to move himself due to weakness and dexterity difficulties.  She states his appetite has been poor, and she has had to feed him.  She states since being in the hospital these past couple of days, he is actually feeding himself and is now able to hold a cup to drink himself.    Given how recent the stroke was, and the progress that wife is seeing, she is hopeful for continued recovery, with home health PT/OT to follow.  We discussed the importance of nutrition and hydration.   SUMMARY OF RECOMMENDATIONS   PMT is following; patient working towards discharge. Patient and family would like home health PT/OT.      Primary Diagnoses: Present on Admission:  AKI (acute kidney injury) (HCC)  Atrial fibrillation (HCC)  CVA (cerebral vascular accident) (HCC)  Type 2 diabetes mellitus with peripheral neuropathy (HCC)  Essential hypertension   I have reviewed the medical record, interviewed the patient and family, and examined the patient. The following aspects are pertinent.  Past Medical  History:  Diagnosis Date   CHF (congestive heart failure) (HCC)    Diabetes (HCC)    Dysrhythmia    AFIB   HTN (hypertension)    Infection    LEG   Pancreatitis    Social History   Socioeconomic History   Marital status: Married    Spouse name: Not on file   Number of children: Not on file   Years of education: Not on file   Highest education level: Not on file  Occupational History   Not on file  Tobacco Use   Smoking status: Never   Smokeless tobacco: Current    Types: Chew  Substance and Sexual Activity   Alcohol use: No    Alcohol/week: 0.0 standard drinks of alcohol   Drug use: No   Sexual activity: Not Currently  Other Topics Concern   Not on file  Social History Narrative   Not on file   Social Determinants of Health   Financial Resource Strain: Not on file  Food Insecurity: No Food Insecurity  (10/26/2022)   Hunger Vital Sign    Worried About Running Out of Food in the Last Year: Never true    Ran Out of Food in the Last Year: Never true  Transportation Needs: No Transportation Needs (10/26/2022)   PRAPARE - Administrator, Civil Service (Medical): No    Lack of Transportation (Non-Medical): No  Physical Activity: Not on file  Stress: Not on file  Social Connections: Not on file   History reviewed. No pertinent family history. Scheduled Meds:  aspirin EC  81 mg Oral Daily   atorvastatin  10 mg Oral Daily   cyanocobalamin  1,000 mcg Oral Daily   ferrous sulfate  325 mg Oral Q breakfast   folic acid  1 mg Oral Daily   gabapentin  100 mg Oral TID   Gerhardt's butt cream   Topical BID   multivitamin with minerals  1 tablet Oral Daily   rivaroxaban  15 mg Oral Q supper   sodium chloride flush  3 mL Intravenous Q12H   thiamine  100 mg Oral Daily   Continuous Infusions:  cefTRIAXone (ROCEPHIN)  IV Stopped (11/21/22 1750)   PRN Meds:.acetaminophen **OR** acetaminophen, atropine, dextrose, ondansetron **OR** ondansetron (ZOFRAN) IV Medications Prior to Admission:  Prior to Admission medications   Medication Sig Start Date End Date Taking? Authorizing Provider  ascorbic acid (VITAMIN C) 500 MG tablet Take 500 mg by mouth daily. 10/06/21  Yes [provider]  aspirin EC 81 MG tablet Take 1 tablet (81 mg total) by mouth daily. Swallow whole. 07/03/22 07/03/23 Yes Dew, Marlow Baars, MD  atorvastatin (LIPITOR) 10 MG tablet Take 1 tablet (10 mg total) by mouth daily. 02/10/21 11/20/22 Yes Dew, Marlow Baars, MD  carvedilol (COREG) 6.25 MG tablet Take 1 tablet (6.25 mg total) by mouth 2 (two) times daily. HOLD THIS MEDICATION UNTIL PT SEES CARDIO SECONDARY TO BRADYCARDIA 11/02/22  Yes Charise Killian, MD  digoxin (LANOXIN) 0.125 MG tablet Take 0.125 mcg daily by mouth. 12/19/16  Yes [provider]  ferrous sulfate 325 (65 FE) MG tablet Take 325 mg by mouth daily with  breakfast. 05/11/21 11/20/22 Yes [provider]  furosemide (LASIX) 20 MG tablet Take 20 mg daily by mouth. 12/19/16  Yes [provider]  gabapentin (NEURONTIN) 100 MG capsule Take 1 capsule (100 mg total) by mouth 3 (three) times daily. 03/16/21 11/20/22 Yes Charise Killian, MD  lisinopril (PRINIVIL,ZESTRIL) 10  MG tablet Take 15 mg by mouth daily.   Yes [provider]  metFORMIN (GLUCOPHAGE) 1000 MG tablet Take 1,000 mg by mouth daily.   Yes [provider]  rivaroxaban (XARELTO) 20 MG TABS tablet Take 20 mg daily by mouth.   Yes [provider]  vitamin B-12 (CYANOCOBALAMIN) 1000 MCG tablet Take 1,000 mcg by mouth daily.   Yes [provider]   No Known Allergies Review of Systems  All other systems reviewed and are negative.   Physical Exam Pulmonary:     Effort: Pulmonary effort is normal.  Neurological:     Mental Status: He is alert.     Vital Signs: BP 135/73 (BP Location: Left Arm)   Pulse (!) 54   Temp (!) 97.4 F (36.3 C)   Resp 16   Ht 6' (1.829 m)   Wt 63.9 kg   SpO2 100%   BMI 19.11 kg/m  Pain Scale: 0-10 POSS *See Group Information*: 1-Acceptable,Awake and alert Pain Score: 0-No pain   SpO2: SpO2: 100 % O2 Device:SpO2: 100 % O2 Flow Rate: .O2 Flow Rate (L/min): 1 L/min  IO: Intake/output summary:  Intake/Output Summary (Last 24 hours) at 11/22/2022 1258 Last data filed at 11/22/2022 1044 Gross per 24 hour  Intake 2093.48 ml  Output 750 ml  Net 1343.48 ml    LBM: Last BM Date : 11/21/22 Baseline Weight: Weight: 88.9 kg Most recent weight: Weight: 63.9 kg      Signed by: Morton Stall, NP   Please contact Palliative Medicine Team phone at [phone] for questions and concerns.  For individual provider: See Loretha Stapler

## 2022-11-22 NOTE — Progress Notes (Signed)
This Chap visited with the Pt and family at bedside. Pt reflected on his life and medical condition as the [place] supplied active listening. Both Pt and family were made aware of spiritual and emotional care support. Feel free to page the [facility] as need be.   11/22/22 1500  Spiritual Encounters  Type of Visit Initial  Care provided to: Pt and family  Referral source Chaplain assessment  Reason for visit Routine spiritual support  OnCall Visit Yes  Spiritual Framework  Presenting Themes Impactful experiences and emotions;Goals in life/care  Community/Connection Family  Interventions  Spiritual Care Interventions Made Established relationship of care and support;Compassionate presence;Narrative/life review  Intervention Outcomes  Outcomes Awareness of support;Connection to spiritual care  Spiritual Care Plan  Spiritual Care Issues Still Outstanding No further spiritual care needs at this time (see row info)

## 2022-11-22 NOTE — Evaluation (Signed)
Occupational Therapy Evaluation Patient Details Name: [name] [name] MRN: [id number] DOB: October 17, 1940 Today's Date: 11/22/2022   History of Present Illness Patient is a 82 y.o. 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 MD assessment also includes: AKI, recent CVA, and Chronic a. fib: w/ slow ventricular response.   Clinical Impression   Patient presenting with decreased Ind in self care,balance, functional mobility/transfers, endurance, and safety awareness. [name] present in room during assessment and reports pt was able to transfer via AP/PA transfer into/out of power chair and wheelchair prior to CVA on 10/26/22. Pt went to rehab for 1 week and then returned home after that. Pt has basically been bed bound with multiple family members providing assistance via bed level for self care and mobility. If pt leave home for appointment and family member total A's pt into wheelchair and uses their transport Zenaida Niece to take him. Pt utilized B UEs for bed positioning with use of bed features. Pt self feeding with use of B UEs this session which wife reports he has been unable to do lately. Minimal spillage noted. Patient will benefit from acute OT to increase overall independence in the areas of ADLs, functional mobility, and safety awareness in order to safely discharge.     Recommendations for follow up therapy are one component of a multi-disciplinary discharge planning process, led by the attending physician.  Recommendations may be updated based on patient status, additional functional criteria and insurance authorization.   Assistance Recommended at Discharge Frequent or constant Supervision/Assistance  Patient can return home with the following Two people to help with walking  and/or transfers;Two people to help with bathing/dressing/bathroom;Assistance with feeding;Direct supervision/assist for medications management;Assist for transportation;Direct supervision/assist for financial management;Help with stairs or ramp for entrance;Assistance with cooking/housework    Functional Status Assessment  Patient has had a recent decline in their functional status and demonstrates the ability to make significant improvements in function in a reasonable and predictable amount of time.  Equipment Recommendations  Other (comment) (defer to next venue of care)       Precautions / Restrictions Precautions Precautions: Fall Restrictions Weight Bearing Restrictions: No Other Position/Activity Restrictions: BLE AKA             ADL either performed or assessed with clinical judgement   ADL Overall ADL's : Needs assistance/impaired Eating/Feeding: Set up;Bed level;Supervision/ safety                                            Pertinent Vitals/Pain Pain Assessment Pain Assessment: No/denies pain     Hand Dominance Right   Extremity/Trunk Assessment Upper Extremity Assessment Upper Extremity Assessment: Generalized weakness LUE Deficits / Details: Pt observed to be self feeding with use of B UE but decreased fine motor coordination and overall strength   Lower Extremity Assessment Lower Extremity Assessment: Defer to PT evaluation       Communication Communication Communication: HOH   Cognition Arousal/Alertness: Awake/alert Behavior During Therapy: WFL for tasks assessed/performed Overall Cognitive Status: Within Functional Limits for tasks assessed Area of Impairment: Following commands, Safety/judgement, Awareness, Problem solving  Home Living Family/patient expects to be discharged to:: Private residence Living Arrangements: Spouse/significant  other;Children Available Help at Discharge: Family;Available 24 hours/day Type of Home: House Home Access: Ramped entrance     Home Layout: One level     Bathroom Shower/Tub: Sponge bathes at baseline   Bathroom Toilet: Standard Bathroom Accessibility: Yes   Home Equipment: Agricultural consultant (2 wheels);BSC/3in1;Hospital bed;Wheelchair - power;Wheelchair - manual   Additional Comments: Pt has power chair, pt lives with spouse, son, and 2 granddaughters.  Lives With: Spouse;Family    Prior Functioning/Environment Prior Level of Function : Needs assist       Physical Assist : Mobility (physical);ADLs (physical) Mobility (physical): Transfers ADLs (physical): Bathing;Toileting;IADLs;Dressing;Grooming Mobility Comments: Pt was transferring AP/PA into wheelchair or power chair prior to CVA 10/26/22 but since them has been bed level. ADLs Comments: Pt needing assistance from family for all self care tasks from bed level.        OT Problem List: Decreased strength;Decreased activity tolerance;Impaired balance (sitting and/or standing);Decreased cognition;Decreased safety awareness;Impaired tone;Impaired UE functional use      OT Treatment/Interventions: Self-care/ADL training;Therapeutic exercise;Energy conservation;Therapeutic activities;Patient/family education;Balance training    OT Goals(Current goals can be found in the care plan section) Acute Rehab OT Goals Patient Stated Goal: to get stronger OT Goal Formulation: With family Time For Goal Achievement: 12/06/22 Potential to Achieve Goals: Poor ADL Goals Pt Will Perform Grooming: sitting;with min assist Pt Will Perform Lower Body Dressing: with mod assist;sitting/lateral leans Pt Will Transfer to Toilet: with mod assist;bedside commode Pt Will Perform Toileting - Clothing Manipulation and hygiene: with mod assist;sitting/lateral leans  OT Frequency: Min 1X/week       AM-PAC OT "6 Clicks" Daily Activity     Outcome  Measure Help from another person eating meals?: A Little Help from another person taking care of personal grooming?: A Little Help from another person toileting, which includes using toliet, bedpan, or urinal?: Total Help from another person bathing (including washing, rinsing, drying)?: Total Help from another person to put on and taking off regular upper body clothing?: A Lot Help from another person to put on and taking off regular lower body clothing?: Total 6 Click Score: 11   End of Session Nurse Communication: Mobility status  Activity Tolerance: Patient tolerated treatment well Patient left: in bed;with bed alarm set;with call bell/phone within reach;with family/visitor present  OT Visit Diagnosis: Muscle weakness (generalized) (M62.81);Feeding difficulties (R63.3)                Time: 1610-9604 OT Time Calculation (min): 22 min Charges:  OT General Charges $OT Visit: 1 Visit OT Evaluation $OT Eval Moderate Complexity: 1 Mod OT Treatments $Self Care/Home Management : 8-22 mins  Jackquline Denmark, MS, OTR/L , CBIS ascom [phone]  11/22/22, 1:33 PM

## 2022-11-23 DIAGNOSIS — Z7189 Other specified counseling: Secondary | ICD-10-CM | POA: Diagnosis not present

## 2022-11-23 DIAGNOSIS — I639 Cerebral infarction, unspecified: Secondary | ICD-10-CM | POA: Diagnosis not present

## 2022-11-23 DIAGNOSIS — N179 Acute kidney failure, unspecified: Secondary | ICD-10-CM | POA: Diagnosis not present

## 2022-11-23 DIAGNOSIS — R310 Gross hematuria: Secondary | ICD-10-CM | POA: Diagnosis not present

## 2022-11-23 LAB — GLUCOSE, CAPILLARY
Glucose-Capillary: 74 mg/dL (ref 70–99)
Glucose-Capillary: 128 mg/dL — ABNORMAL HIGH (ref 70–99)
Glucose-Capillary: 70 mg/dL (ref 70–99)
Glucose-Capillary: 33 mg/dL — CL (ref 70–99)
Glucose-Capillary: 152 mg/dL — ABNORMAL HIGH (ref 70–99)
Glucose-Capillary: 39 mg/dL — CL (ref 70–99)

## 2022-11-23 NOTE — Progress Notes (Signed)
Physical Therapy Treatment Patient Details Name: [name] [name] MRN: [id number] DOB: 1940-11-14 Today's Date: 11/23/2022   History of Present Illness Patient is a 82 y.o. 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 MD assessment also includes: AKI, recent CVA, and Chronic a. fib: w/ slow ventricular response.    PT Comments  Pt in room awake with mittens on, agreeable to session. Pt assisted to short sitting in bed, able ot balance self seated for several minutes before fatigue, intermittent RUE use on rail for support. Pt able to work on posterior scoot transfers in session, clearly remains quite weak. Pt pleasant throughout. Offered drink during session. Left with mittens back in place. Pt repeated scratching area around purewick adhesive. Cued frequently to not remove purewick for risk of leakage and skin breakdown.    If plan is discharge home, recommend the following: Two people to help with walking and/or transfers;Two people to help with bathing/dressing/bathroom;Assistance with cooking/housework;Assist for transportation;Assistance with feeding;Help with stairs or ramp for entrance   Can travel by private vehicle     No  Equipment Recommendations       Recommendations for Other Services       Precautions / Restrictions Precautions Precautions: Fall Restrictions Weight Bearing Restrictions: No RLE Weight Bearing:  (pt has no formal weight bearing orders) LLE Weight Bearing:  (pt has no formal weight bearing orders)     Mobility  Bed Mobility Overal bed mobility: Needs Assistance Bed Mobility: Supine to Sit     Supine to sit: Min guard, Mod assist     General bed mobility comments: needs help, unable to achieve RUE grip on bed rail when facilitated     Transfers Overall transfer level: Needs assistance   Transfers: Bed to chair/wheelchair/BSC         Anterior-Posterior transfers: Mod assist (practiving posterior scoot transfers while longsitting in bed. still weak, but good techique, no cues needed.)        Ambulation/Gait                   Stairs             Wheelchair Mobility     Tilt Bed    Modified Rankin (Stroke Patients Only)       Balance                                            Cognition Arousal/Alertness: Awake/alert Behavior During Therapy: WFL for tasks assessed/performed Overall Cognitive Status: Within Functional Limits for tasks assessed                                          Exercises Other Exercises Other Exercises: sustained long sitting in bed, alternate target taps as cued.    General Comments        Pertinent Vitals/Pain Pain Assessment Pain Assessment: No/denies pain    Home Living                          Prior Function  PT Goals (current goals can now be found in the care plan section) Acute Rehab PT Goals Patient Stated Goal: Get back home; continue working with [facility] PT Goal Formulation: With patient/family Time For Goal Achievement: 12/05/22 Potential to Achieve Goals: Fair Progress towards PT goals: Progressing toward goals    Frequency    Min 1X/week      PT Plan Current plan remains appropriate    Co-evaluation              AM-PAC PT "6 Clicks" Mobility   Outcome Measure  Help needed turning from your back to your side while in a flat bed without using bedrails?: A Lot Help needed moving from lying on your back to sitting on the side of a flat bed without using bedrails?: A Lot Help needed moving to and from a bed to a chair (including a wheelchair)?: A Lot Help needed standing up from a chair using your arms (e.g., wheelchair or bedside chair)?: Total Help needed to walk  in hospital room?: Total Help needed climbing 3-5 steps with a railing? : Total 6 Click Score: 9    End of Session Equipment Utilized During Treatment: Other (comment) (mittens) Activity Tolerance: Patient tolerated treatment well Patient left: in bed;with call bell/phone within reach;with bed alarm set (seated HOB 1t 50 degrees)   PT Visit Diagnosis: Other abnormalities of gait and mobility (R26.89);Muscle weakness (generalized) (M62.81)     Time: 6213-0865 PT Time Calculation (min) (ACUTE ONLY): 20 min  Charges:    $Therapeutic Activity: 8-22 mins PT General Charges $$ ACUTE PT VISIT: 1 Visit                    12:21 PM, 11/23/22 Rosamaria Lints, PT, DPT Physical Therapist - [facility]  [phone] (ASCOM)    Darroll Bredeson C 11/23/2022, 12:18 PM

## 2022-11-23 NOTE — Progress Notes (Addendum)
Daily Progress Note   Patient Name: [name] [name]       Date: 11/23/2022 DOB: [birth year]  Age: 82 y.o. MRN#: [id number] Attending Physician: Delfino Lovett, MD Primary Care Physician: Luciana Axe, NP Admit Date: 11/20/2022  Reason for Consultation/Follow-up: Establishing goals of care  Subjective: Patient is resting in bed with staff at bedside feeding him.  He does not speak during my visit, but continues to eat without difficulty.  No family at bedside.  Plans in place for home with home health.  Length of Stay: 2  Current Medications: Scheduled Meds:   aspirin EC  81 mg Oral Daily   atorvastatin  10 mg Oral Daily   cyanocobalamin  1,000 mcg Oral Daily   ferrous sulfate  325 mg Oral Q breakfast   folic acid  1 mg Oral Daily   gabapentin  100 mg Oral TID   Gerhardt's butt cream   Topical BID   multivitamin with minerals  1 tablet Oral Daily   rivaroxaban  15 mg Oral Q supper   sodium chloride flush  3 mL Intravenous Q12H   thiamine  100 mg Oral Daily    Continuous Infusions:  cefTRIAXone (ROCEPHIN)  IV 200 mL/hr at 11/22/22 1749    PRN Meds: acetaminophen **OR** acetaminophen, atropine, dextrose, ondansetron **OR** ondansetron (ZOFRAN) IV  Physical Exam Pulmonary:     Effort: Pulmonary effort is normal.  Neurological:     Mental Status: He is alert.             Vital Signs: BP (!) 95/45 (BP Location: Left Arm)   Pulse 79   Temp 97.7 F (36.5 C)   Resp 16   Ht 6' (1.829 m)   Wt 63.9 kg   SpO2 100%   BMI 19.11 kg/m  SpO2: SpO2: 100 % O2 Device: O2 Device: Nasal Cannula O2 Flow Rate: O2 Flow Rate (L/min): 1 L/min  Intake/output summary:  Intake/Output Summary (Last 24 hours) at 11/23/2022 1102 Last data filed at 11/23/2022 1031 Gross per 24 hour  Intake 550.69  ml  Output 1450 ml  Net -899.31 ml   LBM: Last BM Date : 11/22/22 Baseline Weight: Weight: 88.9 kg Most recent weight: Weight: 63.9 kg     Patient Active Problem List   Diagnosis Date Noted   Hematuria 11/20/2022  Goals of care, counseling/discussion 11/20/2022   Spinal stenosis in cervical region 10/27/2022   Spondylosis, cervical, 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   CVA (cerebral vascular accident) (HCC) 10/26/2022   Sepsis due to undetermined organism (HCC) 08/10/2022   Cellulitis of second toe of left foot 08/10/2022   Generalized weakness 08/10/2022   Sepsis (HCC) 08/10/2022   Gangrene (HCC) 08/10/2022   AKI (acute kidney injury) (HCC) 08/10/2022   Hx of AKA (above knee amputation), 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   Acute blood loss anemia    Anemia of chronic disease    Leukocytosis    Chronic diastolic CHF (congestive heart failure) (HCC)    Moderate malnutrition (HCC) 03/11/2021   Acute osteomyelitis of right ankle (HCC) 03/08/2021   Open wound of right ankle 03/08/2021   Lactic acidosis 03/08/2021   Hypoalbuminemia due to protein-calorie malnutrition (HCC) 03/08/2021   Hyperglycemia due to diabetes mellitus (HCC) 03/08/2021   Hyponatremia 03/08/2021   Mixed hyperlipidemia 03/08/2021   Peripheral arterial disease (HCC) 03/08/2021   CHF (congestive heart failure) (HCC) 02/02/2021   Type 2 diabetes mellitus with peripheral neuropathy (HCC) 02/02/2021   Right leg weakness 11/19/2019   Vitamin B12 deficiency 05/20/2018   Vitamin D deficiency 05/20/2018   Cardiomyopathy (HCC) 07/29/2013   Asthma 02/19/2012   Essential hypertension 02/19/2012   Atrial fibrillation (HCC) 10/27/2011   GERD (gastroesophageal reflux disease) 10/27/2011   Dyslipidemia 10/27/2011    Palliative Care Assessment & Plan    Recommendations/Plan: TOC working on discharge with home health PT/OT to follow.  Code Status:    Code Status Orders  (From admission, onward)           Start     Ordered    11/20/22 2058  Do not attempt resuscitation (DNR)  Continuous       Question Answer Comment  If patient has no pulse and is not breathing Do Not Attempt Resuscitation   If patient has a pulse and/or is breathing: Medical Treatment Goals LIMITED ADDITIONAL INTERVENTIONS: Use medication/IV fluids and cardiac monitoring as indicated; Do not use intubation or mechanical ventilation (DNI), also provide comfort medications.  Transfer to Progressive/Stepdown as indicated, avoid Intensive Care.   Consent: Discussion documented in EHR or advanced directives reviewed      11/20/22 2057           Code Status History     Date Active Date Inactive Code Status Order ID Comments User Context   11/20/2022 1748 11/20/2022 2057 Full Code [id number]  Verdene Lennert, MD ED   10/26/2022 1500 11/02/2022 2006 Full Code [id number]  Verdene Lennert, MD ED   08/10/2022 0345 08/15/2022 2132 Full Code [id number]  Mansy, Vernetta Honey, MD ED   07/24/2022 1029 07/24/2022 1814 Full Code [id number]  Annice Needy, MD Inpatient   07/03/2022 0958 07/03/2022 1700 Full Code [id number]  Annice Needy, MD Inpatient   03/08/2021 2338 03/16/2021 2223 Full Code [id number]  Frankey Shown, DO ED   02/10/2021 1214 02/10/2021 1924 Full Code [id number]  Annice Needy, MD Inpatient       Prognosis:  Unable to determine   Thank you for allowing the Palliative Medicine Team to assist in the care of this patient.   Morton Stall, NP  Please contact Palliative Medicine Team phone at [phone] for questions and concerns.

## 2022-11-23 NOTE — Progress Notes (Signed)
Hypoglycemic Event  CBG: 33  Treatment: D50 50 mL (25 gm)  Symptoms: None  Follow-up CBG: Time:2229 CBG Result:152  Possible Reasons for Event: Inadequate meal intake  Comments/MD notified:protocol followed    Wille Aubuchon N Jozy Mcphearson

## 2022-11-23 NOTE — Plan of Care (Signed)

## 2022-11-23 NOTE — Progress Notes (Signed)
PROGRESS NOTE   HPI was taken from Dr. Huel [name]:  [name] [name] is a 82 y.o. 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 Mrs. [name] [name] states patient has had poor appetite over the last 3 days. Then over the last 1 day, she has noticed blood in his urine and briefs. She denies any fever, chills, vomiting, abdominal pain or diarrhea. Patient's brother in law states that patient is usually oriented only to self now and has become generally somnolent since his CVA. Patient is unable to participate in transferring and wife is unable to lift him to transfer. Mr. [name] [name] went to a SNF for 1 week but family was unable to afford bill after the initial week.    ED course: On arrival to the ED, patient was normotensive at 161/116 with heart rate of 66.  He was saturating at 98% on room air.  He was afebrile at 97.  Initial workup notable for WBC of 11.8, bicarb 21, glucose 147, BUN 41, creatinine 1.72 with GFR 40.  INR 2.2. CT renal stone study without evidence of nephroureterolithiasis or obstructive uropathy, however significant circumferential wall thickening of the urinary bladder and enlarged prostate.  Urinalysis still pending.  Patient started on IV fluids.  [facility] contacted for admission.  7/31: Palliative care consult, PT and OT recommends home health 8/1: Discontinue telemetry  [name] [name]  ZOX:[id number] DOB: [birth year] DOA: 11/20/2022 PCP: [name] Axe, NP    Assessment & Plan:   Principal Problem:   AKI (acute kidney injury) (HCC) Active Problems:   Hematuria   CVA (cerebral vascular accident) (HCC)   CHF (congestive heart failure) (HCC)   Atrial  fibrillation (HCC)   Type 2 diabetes mellitus with peripheral neuropathy (HCC)   Essential hypertension   Goals of care, counseling/discussion  Assessment and Plan: AKI: Cr is improved  Hematuria: Stopping IV rocephin w/o urine cx obtained. UA was unable to be read b/c urine pigment.  Hematuria resolved   Recent CVA: on 10/26/22. Continue on aspirin, statin & xarelto. Has had significant decline since this last CVA.    Chronic diastolic CHF: compensated. Holding home lasix, coreg, lisinopril. Monitor I/Os & daily weights   HTN: holding home coreg, lisinopril, lasix    DM2: well controlled, HbA1c 5.1. No need for SSI currently   Chronic a. fib: w/ slow ventricular response. S/p atropine x 1. Continue on xarelto. Holding digoxin      DVT prophylaxis: xarelto  Code Status: DNR Family Communication: Tried calling both numbers available in [facility] for family but none answered Disposition Plan: Home with home health  Level of care: Med-Surg Status is: Inpatient Remains inpatient appropriate because: severity of illness, requiring IVFs      Consultants:  Nephro  Palliative care  Procedures:  Antimicrobials: rocephin   Subjective: Was somewhat agitated last evening.  Has mittens on  Objective: Vitals:   11/23/22 0347 11/23/22 0802 11/23/22 1219 11/23/22 1243  BP: 120/69 (!) 95/45 136/89 (!) 132/91  Pulse: (!) 50 79 (!) 49 61  Resp: 18 16 16 18   Temp: 97.9 F (36.6 C) 97.7 F (36.5 C) 98 F (36.7 C) 98 F (36.7 C)  TempSrc: Oral     SpO2: 100% 100%  100%   Weight:      Height:        Intake/Output Summary (Last 24 hours) at 11/23/2022 1433 Last data filed at 11/23/2022 1031 Gross per 24 hour  Intake 310.69 ml  Output 1450 ml  Net -1139.31 ml   Filed Weights   11/20/22 1245 11/20/22 2217 11/22/22 0351  Weight: 88.9 kg 61.8 kg 63.9 kg    Examination:  General exam: Appears calm and comfortable. Frail appearing  Respiratory system: Clear to auscultation.  Respiratory effort normal. Cardiovascular system: S1 & S2 +. No rubs, gallops or clicks.  Gastrointestinal system: Abdomen is nondistended, soft and nontender.Normal bowel sounds heard. Central nervous system: Alert and awake Psychiatry: Judgement and insight appears poor. Flat mood and affect      Data Reviewed: I have personally reviewed following labs and imaging studies  CBC: Recent Labs  Lab 11/20/22 1239 11/21/22 0503 11/23/22 0506  WBC 11.8* 8.5 8.0  HGB 15.2 13.8 11.7*  HCT 42.9 38.8* 34.0*  MCV 81.6 82.0 82.9  PLT 210 156 132*   Basic Metabolic Panel: Recent Labs  Lab 11/20/22 1239 11/21/22 0503 11/23/22 0506  NA 140 138 137  K 4.5 4.2 3.8  CL 104 105 108  CO2 21* 24 23  GLUCOSE 147* 79 83  BUN 41* 47* 24*  CREATININE 1.72* 1.74* 1.10  CALCIUM 9.9 9.0 8.9   GFR: Estimated Creatinine Clearance: 48.4 mL/min (by C-G formula based on SCr of 1.1 mg/dL). Liver Function Tests: Recent Labs  Lab 11/20/22 1239  AST 25  ALT 15  ALKPHOS 78  BILITOT 1.3*  PROT 8.3*  ALBUMIN 3.6   No results for input(s): "LIPASE", "AMYLASE" in the last 168 hours. No results for input(s): "AMMONIA" in the last 168 hours. Coagulation Profile: Recent Labs  Lab 11/20/22 1239  INR 2.2*    CBG: Recent Labs  Lab 11/22/22 0843 11/22/22 1215 11/22/22 1547 11/23/22 0803 11/23/22 1220  GLUCAP 110* 139* 89 74 128*         Radiology Studies: No results found.      Scheduled Meds:  aspirin EC  81 mg Oral Daily   atorvastatin  10 mg Oral Daily   cyanocobalamin  1,000 mcg Oral Daily   ferrous sulfate  325 mg Oral Q breakfast   folic acid  1 mg Oral Daily   gabapentin  100 mg Oral TID   Gerhardt's butt cream   Topical BID   multivitamin with minerals  1 tablet Oral Daily   rivaroxaban  15 mg Oral Q supper   sodium chloride flush  3 mL Intravenous Q12H   thiamine  100 mg Oral Daily   Continuous Infusions:  cefTRIAXone (ROCEPHIN)  IV 200 mL/hr at 11/22/22 1749      LOS: 2 days    Time spent: 25 mins     [name] [name] [name] Burger, MD [facility] Pager [phone]  If 7PM-7AM, please contact night-coverage [email or web address] 11/23/2022, 2:33 PM

## 2022-11-24 ENCOUNTER — Other Ambulatory Visit: Payer: Self-pay | Admitting: *Deleted

## 2022-11-24 DIAGNOSIS — R310 Gross hematuria: Secondary | ICD-10-CM | POA: Diagnosis not present

## 2022-11-24 DIAGNOSIS — N179 Acute kidney failure, unspecified: Secondary | ICD-10-CM | POA: Diagnosis not present

## 2022-11-24 DIAGNOSIS — I639 Cerebral infarction, unspecified: Secondary | ICD-10-CM | POA: Diagnosis not present

## 2022-11-24 DIAGNOSIS — Z7189 Other specified counseling: Secondary | ICD-10-CM | POA: Diagnosis not present

## 2022-11-24 LAB — GLUCOSE, CAPILLARY: Glucose-Capillary: 77 mg/dL (ref 70–99)

## 2022-11-24 NOTE — Plan of Care (Signed)
Problem: Education: Goal: Knowledge of disease or condition will improve 11/24/2022 1124 by Ermalene Searing, RN Outcome: Adequate for Discharge 11/24/2022 1124 by Ermalene Searing, RN Outcome: Progressing Goal: Knowledge of secondary prevention will improve (MUST DOCUMENT ALL) 11/24/2022 1124 by Ermalene Searing, RN Outcome: Adequate for Discharge 11/24/2022 1124 by Ermalene Searing, RN Outcome: Progressing Goal: Knowledge of patient specific risk factors will improve Loraine Leriche N/A or DELETE if not current risk factor) 11/24/2022 1124 by Ermalene Searing, RN Outcome: Adequate for Discharge 11/24/2022 1124 by Ermalene Searing, RN Outcome: Progressing   Problem: Ischemic Stroke/TIA Tissue Perfusion: Goal: Complications of ischemic stroke/TIA will be minimized 11/24/2022 1124 by Ermalene Searing, RN Outcome: Adequate for Discharge 11/24/2022 1124 by Ermalene Searing, RN Outcome: Progressing   Problem: Coping: Goal: Will verbalize positive feelings about self 11/24/2022 1124 by Ermalene Searing, RN Outcome: Adequate for Discharge 11/24/2022 1124 by Ermalene Searing, RN Outcome: Progressing Goal: Will identify appropriate support needs 11/24/2022 1124 by Ermalene Searing, RN Outcome: Adequate for Discharge 11/24/2022 1124 by Ermalene Searing, RN Outcome: Progressing   Problem: Health Behavior/Discharge Planning: Goal: Ability to manage health-related needs will improve 11/24/2022 1124 by Ermalene Searing, RN Outcome: Adequate for Discharge 11/24/2022 1124 by Ermalene Searing, RN Outcome: Progressing Goal: Goals will be collaboratively established with patient/family 11/24/2022 1124 by Ermalene Searing, RN Outcome: Adequate for Discharge 11/24/2022 1124 by Ermalene Searing, RN Outcome: Progressing   Problem: Self-Care: Goal: Ability to participate in self-care as condition permits will improve 11/24/2022 1124 by Ermalene Searing, RN Outcome: Adequate for Discharge 11/24/2022 1124 by Ermalene Searing, RN Outcome: Progressing Goal: Verbalization of feelings and concerns over  difficulty with self-care will improve 11/24/2022 1124 by Ermalene Searing, RN Outcome: Adequate for Discharge 11/24/2022 1124 by Ermalene Searing, RN Outcome: Progressing Goal: Ability to communicate needs accurately will improve 11/24/2022 1124 by Ermalene Searing, RN Outcome: Adequate for Discharge 11/24/2022 1124 by Ermalene Searing, RN Outcome: Progressing   Problem: Nutrition: Goal: Risk of aspiration will decrease 11/24/2022 1124 by Ermalene Searing, RN Outcome: Adequate for Discharge 11/24/2022 1124 by Ermalene Searing, RN Outcome: Progressing Goal: Dietary intake will improve 11/24/2022 1124 by Ermalene Searing, RN Outcome: Adequate for Discharge 11/24/2022 1124 by Ermalene Searing, RN Outcome: Progressing   Problem: Education: Goal: Ability to describe self-care measures that may prevent or decrease complications (Diabetes Survival Skills Education) will improve 11/24/2022 1124 by Ermalene Searing, RN Outcome: Adequate for Discharge 11/24/2022 1124 by Ermalene Searing, RN Outcome: Progressing Goal: Individualized Educational Video(s) 11/24/2022 1124 by Ermalene Searing, RN Outcome: Adequate for Discharge 11/24/2022 1124 by Ermalene Searing, RN Outcome: Progressing   Problem: Coping: Goal: Ability to adjust to condition or change in health will improve 11/24/2022 1124 by Ermalene Searing, RN Outcome: Adequate for Discharge 11/24/2022 1124 by Ermalene Searing, RN Outcome: Progressing   Problem: Fluid Volume: Goal: Ability to maintain a balanced intake and output will improve 11/24/2022 1124 by Ermalene Searing, RN Outcome: Adequate for Discharge 11/24/2022 1124 by Ermalene Searing, RN Outcome: Progressing   Problem: Health Behavior/Discharge Planning: Goal: Ability to identify and utilize available resources and services will improve 11/24/2022 1124 by Ermalene Searing, RN Outcome: Adequate for Discharge 11/24/2022 1124 by Ermalene Searing, RN Outcome: Progressing Goal: Ability to manage health-related needs will improve 11/24/2022 1124 by Ermalene Searing,  RN Outcome: Adequate for Discharge 11/24/2022  1124 by Ermalene Searing, RN Outcome: Progressing   Problem: Metabolic: Goal: Ability to maintain appropriate glucose levels will improve 11/24/2022 1124 by Ermalene Searing, RN Outcome: Adequate for Discharge 11/24/2022 1124 by Ermalene Searing, RN Outcome: Progressing   Problem: Nutritional: Goal: Maintenance of adequate nutrition will improve 11/24/2022 1124 by Ermalene Searing, RN Outcome: Adequate for Discharge 11/24/2022 1124 by Ermalene Searing, RN Outcome: Progressing Goal: Progress toward achieving an optimal weight will improve 11/24/2022 1124 by Ermalene Searing, RN Outcome: Adequate for Discharge 11/24/2022 1124 by Ermalene Searing, RN Outcome: Progressing   Problem: Skin Integrity: Goal: Risk for impaired skin integrity will decrease 11/24/2022 1124 by Ermalene Searing, RN Outcome: Adequate for Discharge 11/24/2022 1124 by Ermalene Searing, RN Outcome: Progressing   Problem: Tissue Perfusion: Goal: Adequacy of tissue perfusion will improve 11/24/2022 1124 by Ermalene Searing, RN Outcome: Adequate for Discharge 11/24/2022 1124 by Ermalene Searing, RN Outcome: Progressing   Problem: Education: Goal: Knowledge of General Education information will improve Description: Including pain rating scale, medication(s)/side effects and non-pharmacologic comfort measures 11/24/2022 1124 by Ermalene Searing, RN Outcome: Adequate for Discharge 11/24/2022 1124 by Ermalene Searing, RN Outcome: Progressing   Problem: Health Behavior/Discharge Planning: Goal: Ability to manage health-related needs will improve 11/24/2022 1124 by Ermalene Searing, RN Outcome: Adequate for Discharge 11/24/2022 1124 by Ermalene Searing, RN Outcome: Progressing   Problem: Clinical Measurements: Goal: Ability to maintain clinical measurements within normal limits will improve 11/24/2022 1124 by Ermalene Searing, RN Outcome: Adequate for Discharge 11/24/2022 1124 by Ermalene Searing, RN Outcome: Progressing Goal: Will remain free from  infection 11/24/2022 1124 by Ermalene Searing, RN Outcome: Adequate for Discharge 11/24/2022 1124 by Ermalene Searing, RN Outcome: Progressing Goal: Diagnostic test results will improve 11/24/2022 1124 by Ermalene Searing, RN Outcome: Adequate for Discharge 11/24/2022 1124 by Ermalene Searing, RN Outcome: Progressing Goal: Respiratory complications will improve 11/24/2022 1124 by Ermalene Searing, RN Outcome: Adequate for Discharge 11/24/2022 1124 by Ermalene Searing, RN Outcome: Progressing Goal: Cardiovascular complication will be avoided 11/24/2022 1124 by Ermalene Searing, RN Outcome: Adequate for Discharge 11/24/2022 1124 by Ermalene Searing, RN Outcome: Progressing   Problem: Activity: Goal: Risk for activity intolerance will decrease 11/24/2022 1124 by Ermalene Searing, RN Outcome: Adequate for Discharge 11/24/2022 1124 by Ermalene Searing, RN Outcome: Progressing   Problem: Nutrition: Goal: Adequate nutrition will be maintained 11/24/2022 1124 by Ermalene Searing, RN Outcome: Adequate for Discharge 11/24/2022 1124 by Ermalene Searing, RN Outcome: Progressing   Problem: Coping: Goal: Level of anxiety will decrease 11/24/2022 1124 by Ermalene Searing, RN Outcome: Adequate for Discharge 11/24/2022 1124 by Ermalene Searing, RN Outcome: Progressing   Problem: Elimination: Goal: Will not experience complications related to bowel motility 11/24/2022 1124 by Ermalene Searing, RN Outcome: Adequate for Discharge 11/24/2022 1124 by Ermalene Searing, RN Outcome: Progressing Goal: Will not experience complications related to urinary retention 11/24/2022 1124 by Ermalene Searing, RN Outcome: Adequate for Discharge 11/24/2022 1124 by Ermalene Searing, RN Outcome: Progressing   Problem: Pain Managment: Goal: General experience of comfort will improve 11/24/2022 1124 by Ermalene Searing, RN Outcome: Adequate for Discharge 11/24/2022 1124 by Ermalene Searing, RN Outcome: Progressing   Problem: Safety: Goal: Ability to remain free from injury will improve 11/24/2022 1124 by Ermalene Searing, RN Outcome: Adequate for Discharge 11/24/2022 1124 by Ermalene Searing, RN  Outcome: Progressing   Problem: Skin Integrity: Goal: Risk for impaired skin integrity will decrease 11/24/2022 1124 by Ermalene Searing, RN Outcome: Adequate for Discharge 11/24/2022 1124 by Ermalene Searing, RN Outcome: Progressing

## 2022-11-24 NOTE — Care Management Important Message (Signed)
Important Message  Patient Details  Name: [name] [name] MRN: [id number] Date of Birth: 06/18/1940   Medicare Important Message Given:  Yes     [name] A  11/24/2022, 10:54 AM

## 2022-11-24 NOTE — Plan of Care (Signed)

## 2022-11-24 NOTE — TOC Transition Note (Addendum)
Transition of Care [facility]) - CM/SW Discharge Note   Patient Details  Name: [name] [name] MRN: [id number] Date of Birth: August 14, 1940  [facility] CM/SW Contact:  Truddie Hidden, RN Phone Number: 11/24/2022, 1:28 PM   Clinical Narrative:    Attempt to reach patient's spouse. Patient's son, Justin Mend stated [name] was on her way home. RNCM explained role. Justin Mend stated he would have his mother call this RNCM regarding HH arrangements.    2:05pm Spoke with patient's wife. Patient is active with [facility]. Message sent to [facility] from [place] advising of patient's discharge.    TOC signing off.      Barriers to Discharge: Continued Medical Work up   Patient Goals and CMS Choice CMS Medicare.gov Compare Post Acute Care list provided to:: Patient Choice offered to / list presented to : Patient  Discharge Placement                         Discharge Plan and Services Additional resources added to the After Visit Summary for                                       Social Determinants of Health (SDOH) Interventions SDOH Screenings   Food Insecurity: No Food Insecurity (10/26/2022)  Housing: Low Risk  (10/26/2022)  Transportation Needs: No Transportation Needs (10/26/2022)  Utilities: Not At Risk (10/26/2022)  Tobacco Use: High Risk (11/20/2022)     Readmission Risk Interventions     No data to display

## 2022-11-24 NOTE — Discharge Summary (Signed)
Physician Discharge Summary   Patient: [name] [name] MRN: [id number] DOB: Jul 22, 1940  Admit date:     11/20/2022  Discharge date: 11/24/22  Discharge Physician: Delfino Lovett   PCP: Luciana Axe, NP   Recommendations at discharge:   Follow-up with outpatient providers as requested Consider hospice evaluation if he continues to have clinical and functional decline  Discharge Diagnoses: Principal Problem:   AKI (acute kidney injury) (HCC) Active Problems:   Hematuria   CVA (cerebral vascular accident) (HCC)   CHF (congestive heart failure) (HCC)   Atrial fibrillation (HCC)   Type 2 diabetes mellitus with peripheral neuropathy (HCC)   Essential hypertension   Goals of care, counseling/discussion  Hospital Course: Assessment and Plan:  [name] [name] is a 82 y.o. 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 Mrs. Bartolini states patient has had poor appetite over the last 3 days. Then over the last 1 day, she has noticed blood in his urine and briefs. She denies any fever, chills, vomiting, abdominal pain or diarrhea. Patient's brother in law states that patient is usually oriented only to self now and has become generally somnolent since his CVA. Patient is unable to participate in transferring and wife is unable to lift him to transfer. Mr. Widdowson went to a SNF for 1 week but family was unable to afford bill after the initial week.    ED course: On arrival to the ED, patient was normotensive at 161/116 with heart rate of 66.  He was saturating at 98% on room air.  He was afebrile at 97.  Initial workup notable for WBC of 11.8, bicarb 21, glucose  147, BUN 41, creatinine 1.72 with GFR 40.  INR 2.2. CT renal stone study without evidence of nephroureterolithiasis or obstructive uropathy, however significant circumferential wall thickening of the urinary bladder and enlarged prostate.  Urinalysis still pending.  Patient started on IV fluids.  [facility] contacted for admission.   7/31: Palliative care consult, PT and OT recommends home health 8/1: Discontinue telemetry   Assessment and Plan: AKI: Cr is improved   Hematuria: Treated with IV rocephin w/o urine cx obtained. UA was unable to be read b/c urine pigment.  Hematuria resolved.  I had a long discussion with patient and family due to his high risk for fall and bleeding.  They have agreed to stop Xarelto at this time.  They are also considering hospice if he continues to have further clinical and functional decline.  They do understand risk for stopping Xarelto including stroke   Recent CVA: on 10/26/22. Continue on aspirin, statin. Has had significant decline since this last CVA.    Chronic diastolic CHF: compensated.  Continue home medications   HTN: Continue home medications   DM2 with episodes of hypoglycemia: well controlled, HbA1c 5.1. No need any diabetic medication.  Encourage oral nutrition.  He did have sugar running as low as in 30s but most other sugars were in 100s.  Patient was asymptomatic   Chronic a. fib: w/ slow ventricular response. S/p atropine x 1.  Stopped xarelto as high risk for bleeding and fall and digoxin his heart rate remains well-controlled.  Patient and family in agreement  Consultants: Palliative care Disposition: Home health Diet recommendation:  Discharge Diet Orders (From admission, onward)     Start     Ordered   11/24/22 0000  Diet - low sodium heart healthy        11/24/22 1029           Regular diet DISCHARGE MEDICATION: Allergies as of 11/24/2022   No Known Allergies      Medication List     STOP taking these medications     digoxin 0.125 MG tablet Commonly known as: LANOXIN   metFORMIN 1000 MG tablet Commonly known as: GLUCOPHAGE   rivaroxaban 20 MG Tabs tablet Commonly known as: XARELTO       TAKE these medications    ascorbic acid 500 MG tablet Commonly known as: VITAMIN C Take 500 mg by mouth daily.   aspirin EC 81 MG tablet Take 1 tablet (81 mg total) by mouth daily. Swallow whole.   atorvastatin 10 MG tablet Commonly known as: Lipitor Take 1 tablet (10 mg total) by mouth daily.   carvedilol 6.25 MG tablet Commonly known as: COREG Take 1 tablet (6.25 mg total) by mouth 2 (two) times daily. HOLD THIS MEDICATION UNTIL PT SEES CARDIO SECONDARY TO BRADYCARDIA   cyanocobalamin 1000 MCG tablet Commonly known as: VITAMIN B12 Take 1,000 mcg by mouth daily.   ferrous sulfate 325 (65 FE) MG tablet Take 325 mg by mouth daily with breakfast.   furosemide 20 MG tablet Commonly known as: LASIX Take 20 mg daily by mouth.   gabapentin 100 MG capsule Commonly known as: NEURONTIN Take 1 capsule (100 mg total) by mouth 3 (three) times daily.   lisinopril 10 MG tablet Commonly known as: ZESTRIL Take 15 mg by mouth daily.               Discharge Care Instructions  (From admission, onward)           Start     Ordered   11/24/22 0000  Discharge wound care:       Comments: As above   11/24/22 1029            Follow-up Information     Luciana Axe, NP. Go on 12/07/2022.   Specialty: Family Medicine Why: [facility] Discharge F/UP. Go at 3:45pm. Contact information: [address] [place] [address] [phone]                Discharge Exam: Ceasar Mons Weights   11/20/22 2217 11/22/22 0351 11/24/22 0334  Weight: 61.8 kg 63.9 kg 64.7 kg   General exam: Appears calm and comfortable. Frail appearing  Respiratory system: Clear to auscultation. Respiratory effort normal. Cardiovascular system: S1 & S2 +. No rubs, gallops or clicks.  Gastrointestinal  system: Abdomen is nondistended, soft and nontender.Normal bowel sounds heard. Central nervous system: Alert and awake Psychiatry: Judgement and insight appears normal.  Normal mood and affect    Condition at discharge: fair  The results of significant diagnostics from this hospitalization (including imaging, microbiology, ancillary and laboratory) are listed below for reference.   Imaging Studies: US RENAL  Result Date: 11/20/2022 CLINICAL DATA:  Gross hematuria. Exophytic renal lesion on noncontrast CT earlier today. EXAM: RENAL / URINARY TRACT ULTRASOUND COMPLETE COMPARISON:  CT stone study earlier today. FINDINGS: Right Kidney: Renal measurements: 10.1 x 4.3 x 4.3 cm = volume: 97 mL. Echogenicity within normal limits. 3.5 x 3.0 x 3.2 cm exophytic cyst is identified in the upper pole. This contains  a single internal septation measuring up to 3 mm. Left Kidney: Renal measurements: 10.3 x 5.1 x 4.8 cm = volume: 131 mL. Echogenicity within normal limits. No mass or hydronephrosis visualized. Bladder: As on CT scan earlier today, marked circumferential bladder wall thickening evident. Other: None. IMPRESSION: 1. 3.5 cm exophytic cyst in the upper pole of the right kidney contains a single internal septation. Septation measures up to 3 mm in thickness with a somewhat nodular character. 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 Follow-up renal ultrasound in 3-6 months recommended to ensure stability. 2. Marked circumferential bladder wall thickening. Infection/inflammation not excluded. Electronically Signed   By: Kennith Center M.D.   On: 11/20/2022 17:20   CT Renal Stone Study  Result Date: 11/20/2022 CLINICAL DATA:  Abdominal/flank pain, stone suspected gross hematuria EXAM: CT ABDOMEN AND PELVIS WITHOUT CONTRAST TECHNIQUE: Multidetector CT imaging of the abdomen and pelvis was performed following the standard protocol without IV contrast. RADIATION DOSE REDUCTION: This exam was  performed according to the departmental dose-optimization program which includes automated exposure control, adjustment of the mA and/or kV according to patient size and/or use of iterative reconstruction technique. COMPARISON:  None Available. FINDINGS: Lower chest: There are patchy atelectatic changes in the visualized lung bases. No overt consolidation. No pleural effusion. The heart is normal in size. No pericardial effusion. Bilateral moderate-to-severe symmetric gynecomastia noted. Hepatobiliary: The liver is normal in size. Non-cirrhotic configuration. No suspicious mass. No intrahepatic or extrahepatic bile duct dilation. Gallbladder is surgically absent. Pancreas: Unremarkable. No pancreatic ductal dilatation or surrounding inflammatory changes. Spleen: Within normal limits. No focal lesion. Adrenals/Urinary Tract: Adrenal glands are unremarkable. No suspicious renal mass. There is a partially exophytic 2.3 x 3.6 cm hypoattenuating structure arising from the right kidney upper pole, laterally. The structure exhibits internal CT attenuation of 24-25 Hounsfield units. There is a single, thin, incomplete calcified septum. This is incompletely characterized on the current examination but favored to represent a proteinaceous/hemorrhagic cyst. Further evaluation with nonemergent renal ultrasound is recommended. No hydronephrosis. No renal or ureteric calculi. The urinary bladder is minimally distended. However, there is significant uniform circumferential wall thickening measuring up to 1.8 cm. No perivesical fat stranding. Stomach/Bowel: No disproportionate dilation of the small or large bowel loops. No evidence of abnormal bowel wall thickening or inflammatory changes. The appendix is unremarkable. Vascular/Lymphatic: No ascites or pneumoperitoneum. No abdominal or pelvic lymphadenopathy, by size criteria. No aneurysmal dilation of the major abdominal arteries. There are mild peripheral atherosclerotic vascular  calcifications of the aorta and its major branches. Reproductive: Enlarged prostate. Symmetric seminal vesicles. Other: The visualized soft tissues and abdominal wall are unremarkable. Musculoskeletal: No suspicious osseous lesions. There are mild multilevel degenerative changes in the visualized spine. IMPRESSION: 1. No nephroureterolithiasis or obstructive uropathy. 2. Significant circumferential wall thickening of the urinary bladder. This may be secondary to chronic bladder outlet obstruction or cystitis. Correlate with urinalysis. 3. Enlarged prostate. 4. Incompletely characterized right renal lesion. Recommend renal ultrasound for further evaluation. Electronically Signed   By: Jules Schick M.D.   On: 11/20/2022 15:03   MR CERVICAL SPINE WO CONTRAST  Result Date: 10/27/2022 CLINICAL DATA:  Myelopathy, acute, cervical spine. EXAM: MRI CERVICAL SPINE WITHOUT CONTRAST TECHNIQUE: Multiplanar, multisequence MR imaging of the cervical spine was performed. No intravenous contrast was administered. COMPARISON:  None Available. FINDINGS: The study is partially degraded by motion. Alignment: 3 mm anterolisthesis of C3 over C4. Trace anterolisthesis at C5-6 and C6-7. Vertebrae: No fracture, evidence of  discitis, or bone lesion. Cord: Prominent mass effect on the cord at C3-4 with associated T2 hyperintensity. Posterior Fossa, vertebral arteries, paraspinal tissues: Thickening of the retro odontoid soft tissues. Disc levels: C2-3: Posterior disc osteophyte complex and ligamentum flavum thickening resulting in mild spinal canal stenosis. Uncovertebral and facet degenerative changes resulting in high-grade bilateral neural foraminal narrowing. C3-4: Anterolisthesis, posterior disc osteophyte complex and thickening of the ligamentum flavum resulting in severe spinal canal stenosis with severe mass effect on the cord. Uncovertebral and facet degenerative changes resulting in high-grade bilateral neural foraminal  narrowing. C4-5: Small posterior disc osteophyte complex without significant spinal canal stenosis. Uncovertebral and facet degenerative changes resulting in high-grade bilateral neural foraminal narrowing. C5-6: Posterior disc osteophyte complex resulting in mild spinal canal stenosis. Uncovertebral and facet degenerative changes resulting in mild right and high-grade left neural foraminal narrowing. C6-7: Small posterior disc osteophyte complex without significant spinal canal stenosis. Uncovertebral and facet degenerative changes resulting in mild right and high-grade left neural foraminal narrowing. C7-T1: Posterior disc osteophyte complex resulting in mild spinal canal stenosis. Uncovertebral and facet degenerative changes resulting high-grade bilateral neural foraminal narrowing. IMPRESSION: 1. Advanced degenerative changes of the cervical spine, more pronounced at C3-4 where there is severe spinal canal stenosis with severe mass effect on the cord and associated cord signal abnormality. 2. Multilevel high-grade neural foraminal narrowing, as described above. Electronically Signed   By: Baldemar Lenis M.D.   On: 10/27/2022 16:37   ECHOCARDIOGRAM COMPLETE  Result Date: 10/27/2022    ECHOCARDIOGRAM REPORT   Patient Name:   GRANT [facility] Date of Exam: 10/26/2022 Medical Rec #:  [id number]    Height:       72.0 in Accession #:    [id number]   Weight:       196.4 lb Date of Birth:  [birth year]    BSA:          2.114 m Patient Age:    81 years     BP:           164/75 mmHg Patient Gender: M            HR:           56 bpm. Exam Location:  [facility] Procedure: 2D Echo Indications:     Stroke I63.9  History:         Patient has no prior history of Echocardiogram examinations.  Sonographer:     Overton Mam RDCS, FASE Referring Phys:  [id number] Gordy Councilman Diagnosing Phys: Marcina Millard MD IMPRESSIONS  1. Left ventricular ejection fraction, by estimation, is 65 to 70%. The left ventricle has normal  function. The left ventricle has no regional wall motion abnormalities. There is moderate left ventricular hypertrophy. Indeterminate diastolic filling due  to E-A fusion.  2. Right ventricular systolic function is normal. The right ventricular size is normal.  3. The mitral valve is normal in structure. Mild mitral valve regurgitation. No evidence of mitral stenosis.  4. Tricuspid valve regurgitation is mild to moderate.  5. The aortic valve is normal in structure. Aortic valve regurgitation is not visualized. No aortic stenosis is present.  6. The inferior vena cava is normal in size with greater than 50% respiratory variability, suggesting right atrial pressure of 3 mmHg. FINDINGS  Left Ventricle: Left ventricular ejection fraction, by estimation, is 65 to 70%. The left ventricle has normal function. The left ventricle has no regional wall motion abnormalities. The left ventricular internal cavity size was normal in  size. There is  moderate left ventricular hypertrophy. Indeterminate diastolic filling due to E-A fusion. Right Ventricle: The right ventricular size is normal. No increase in right ventricular wall thickness. Right ventricular systolic function is normal. Left Atrium: Left atrial size was normal in size. Right Atrium: Right atrial size was normal in size. Pericardium: There is no evidence of pericardial effusion. Mitral Valve: The mitral valve is normal in structure. Mild mitral valve regurgitation. No evidence of mitral valve stenosis. Tricuspid Valve: The tricuspid valve is normal in structure. Tricuspid valve regurgitation is mild to moderate. No evidence of tricuspid stenosis. Aortic Valve: The aortic valve is normal in structure. Aortic valve regurgitation is not visualized. No aortic stenosis is present. Aortic valve peak gradient measures 11.6 mmHg. Pulmonic Valve: The pulmonic valve was normal in structure. Pulmonic valve regurgitation is not visualized. No evidence of pulmonic stenosis.  Aorta: The aortic root is normal in size and structure. Venous: The inferior vena cava is normal in size with greater than 50% respiratory variability, suggesting right atrial pressure of 3 mmHg. IAS/Shunts: No atrial level shunt detected by color flow Doppler.  LEFT VENTRICLE PLAX 2D LVIDd:         3.70 cm   Diastology LVIDs:         2.20 cm   LV e' medial:    12.60 cm/s LV PW:         1.40 cm   LV E/e' medial:  6.0 LV IVS:        1.50 cm   LV e' lateral:   14.40 cm/s LVOT diam:     2.00 cm   LV E/e' lateral: 5.2 LV SV:         57 LV SV Index:   27 LVOT Area:     3.14 cm  RIGHT VENTRICLE RV Basal diam:  2.40 cm RV S prime:     12.40 cm/s TAPSE (M-mode): 1.7 cm LEFT ATRIUM           Index        RIGHT ATRIUM           Index LA diam:      3.20 cm 1.51 cm/m   RA Area:     13.30 cm LA Vol (A2C): 69.2 ml 32.73 ml/m  RA Volume:   30.00 ml  14.19 ml/m LA Vol (A4C): 46.0 ml 21.76 ml/m  AORTIC VALVE                 PULMONIC VALVE AV Area (Vmax): 1.68 cm     PV Vmax:       1.12 m/s AV Vmax:        170.00 cm/s  PV Peak grad:  5.0 mmHg AV Peak Grad:   11.6 mmHg LVOT Vmax:      91.00 cm/s LVOT Vmean:     58.400 cm/s LVOT VTI:       0.182 m  AORTA Ao Root diam: 3.40 cm Ao Asc diam:  3.30 cm MITRAL VALVE               TRICUSPID VALVE MV Area (PHT): 2.85 cm    TR Peak grad:   26.0 mmHg MV Decel Time: 266 msec    TR Vmax:        255.00 cm/s MV E velocity: 75.50 cm/s                            SHUNTS  Systemic VTI:  0.18 m                            Systemic Diam: 2.00 cm Marcina Millard MD Electronically signed by Marcina Millard MD Signature Date/Time: 10/27/2022/2:30:37 PM    Final    MR BRAIN WO CONTRAST  Result Date: 10/26/2022 CLINICAL DATA:  Altered mental status, stroke follow-up. EXAM: MRI HEAD WITHOUT CONTRAST TECHNIQUE: Multiplanar, multiecho pulse sequences of the brain and surrounding structures were obtained without intravenous contrast. COMPARISON:  Same-day CT/CTA head and  neck. FINDINGS: Brain: There is extensive diffusion restriction in the right MCA distribution involving the caudate and lentiform nucleus, dorsal lateral thalamus, posterior aspect of the insula, and temporal and parietal cortex with associated FLAIR signal abnormality consistent with acute to early subacute infarct. The frontal cortex is spared. There is mild gyral swelling and sulcal effacement but no midline shift. There is no evidence of hemorrhagic transformation. Background parenchymal volume is within expected limits for age. The ventricles are normal in size. There are small remote infarcts in the bilateral cerebellar hemispheres, thalami, and right basal ganglia superimposed on mild background chronic small-vessel ischemic change The pituitary and suprasellar region are normal. There is no mass lesion. There is no mass effect or midline shift. Vascular: SWI signal dropout in the right sylvian fissure likely reflects clot in an M2 vessel. Skull and upper cervical spine: Normal marrow signal. Sinuses/Orbits: The paranasal sinuses are clear. Bilateral lens implants are in place. The globes and orbits are otherwise unremarkable. Other: The mastoid air cells and middle ear cavities are clear. IMPRESSION: Extensive acute to early subacute infarct in the right MCA distribution as above without hemorrhagic transformation or mass effect. Electronically Signed   By: Lesia Hausen M.D.   On: 10/26/2022 18:17   CT ANGIO HEAD NECK W WO CM  Result Date: 10/26/2022 CLINICAL DATA:  Fatigue, altered mental status. EXAM: CT ANGIOGRAPHY HEAD AND NECK WITH AND WITHOUT CONTRAST TECHNIQUE: Multidetector CT imaging of the head and neck was performed using the standard protocol during bolus administration of intravenous contrast. Multiplanar CT image reconstructions and MIPs were obtained to evaluate the vascular anatomy. Carotid stenosis measurements (when applicable) are obtained utilizing NASCET criteria, using the distal  internal carotid diameter as the denominator. RADIATION DOSE REDUCTION: This exam was performed according to the departmental dose-optimization program which includes automated exposure control, adjustment of the mA and/or kV according to patient size and/or use of iterative reconstruction technique. CONTRAST:  75mL OMNIPAQUE IOHEXOL 350 MG/ML SOLN COMPARISON:  Same-day CT head FINDINGS: CTA NECK FINDINGS Aortic arch: There is mild calcified plaque in the imaged aortic arch. The origins of the major branch vessels are patent. The subclavian arteries are patent to the level imaged. Right carotid system: The right common, internal, and external carotid arteries are patent, without hemodynamically significant stenosis or occlusion there is no evidence of dissection or aneurysm. Left carotid system: The left common, internal, and external carotid arteries are patent, without hemodynamically significant stenosis or occlusion. There is no evidence of dissection or aneurysm. Vertebral arteries: The right vertebral artery is occluded throughout its course with a minimal reconstitution of flow in the V3 segment. The left vertebral artery is diminutive in caliber but patent throughout. There is moderate stenosis at its origin. Skeleton: There is no acute osseous abnormality or suspicious osseous lesion. There is no visible canal hematoma. There is probable severe spinal canal stenosis at C3-C4 with suspected  cord compression. Other neck: Soft tissues of the neck are unremarkable. Upper chest: The imaged lung apices are clear. The esophagus is patulous and filled with debris. Review of the MIP images confirms the above findings CTA HEAD FINDINGS Anterior circulation: There is mild calcified plaque in the intracranial ICAs without significant stenosis or occlusion. The right M1 segment is patent. An inferior right M2 branch is occluded in the sylvian fissure (8-96), and a superior right M2/proximal M3 branch is occluded more  distally (8-102). There is overall minimal collateral flow in the distal right MCA distribution (11-24). The left M1 segment and distal branches are patent, without proximal high-grade stenosis or occlusion. The bilateral ACAS are patent, without proximal stenosis or occlusion. The anterior communicating artery is normal. There is no aneurysm or AVM. Posterior circulation: The right V4 segment is occluded. The left V4 segment is markedly diminutive but patent. The basilar artery is diminutive and severely stenotic proximally. PICA is identified bilaterally. The superior cerebellar arteries are identified bilaterally The bilateral PCAs are patent, supplied by prominent posterior communicating arteries bilaterally. There is no proximal stenosis or occlusion. There is no aneurysm or AVM. Venous sinuses: Not well assessed due to bolus timing. Anatomic variants: As above. Review of the MIP images confirms the above findings IMPRESSION: 1. 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 2. 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 The left vertebral artery is diminutive in caliber but patent throughout. 3. 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 The bilateral PCAs are patent, supplied by prominent posterior communicating arteries bilaterally. 4. Patent carotid systems without hemodynamically significant stenosis or occlusion. 5. 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 Consider MRI of the cervical spine for further evaluation. 6. Patulous and debris-filled esophagus. Correlate with any history of reflux. Electronically Signed   By: Lesia Hausen M.D.   On: 10/26/2022 15:13   CT HEAD WO CONTRAST ( )  Result Date: 10/26/2022 CLINICAL  DATA:  Mental status change. EXAM: CT HEAD WITHOUT CONTRAST TECHNIQUE: Contiguous axial images were obtained from the base of the skull through the vertex without intravenous contrast. RADIATION DOSE REDUCTION: This exam was performed according to the departmental dose-optimization program which includes automated exposure control, adjustment of the mA and/or kV according to patient size and/or use of iterative reconstruction technique. COMPARISON:  08/10/2022 FINDINGS: Brain: Signs subacute infarct is identified involving the right parietal lobe with cortical and subcortical low attenuation edema with effacement of the sulci, image 18/2. Subacute infarct with low-attenuation edema involving the right basal ganglia is also identified and appears new from the previous exam, image 16/2. No sign of acute intracranial hemorrhage. There is moderate diffuse low-attenuation within the subcortical and periventricular white matter compatible with chronic microvascular disease. Prominence of the sulci and ventricles identified compatible with brain atrophy. Vascular: Asymmetric increased density within a branch of the right middle cerebral artery is noted, image 15/2. Skull: Normal. Negative for fracture or focal lesion. Sinuses/Orbits: No acute finding. Other: None. IMPRESSION: 1. 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 2. Asymmetric increased density within a branch of the right middle cerebral artery is identified, concerning for thrombus. 3. No sign of acute intracranial hemorrhage. 4. Chronic microvascular disease and brain atrophy. Critical  Value/emergent results were called by telephone at the time of interpretation on 10/26/2022 at 1:09 pm to provider Willy Eddy , who verbally acknowledged these results. Electronically Signed   By: Signa Kell M.D.   On: 10/26/2022 13:09   DG Chest Port 1 View  Result Date: 10/26/2022 CLINICAL DATA:  Altered mental status EXAM:  PORTABLE CHEST 1 VIEW COMPARISON:  Chest x-ray dated August 09, 2022 FINDINGS: Cardiac and mediastinal contours are within normal limits for exam technique. Lungs are clear. No evidence of pleural effusion or pneumothorax. Patient head position somewhat limits evaluation of the upper lungs. IMPRESSION: No active disease. Electronically Signed   By: Allegra Lai M.D.   On: 10/26/2022 12:37    Microbiology: Results for orders placed or performed in visit on 09/05/22  Aerobic culture     Status: Abnormal   Collection Time: 09/05/22  2:10 PM  Result Value Ref Range Status   Aerobic Bacterial Culture Final report (A)  Final   Organism ID, Bacteria Comment (A)  Final    Comment: Methicillin - resistant Staphylococcus aureus Based on resistance to oxacillin this isolate would be resistant to all currently available beta-lactam antimicrobial agents, with the exception of the newer cephalosporins with anti-MRSA activity, such as Ceftaroline Heavy growth    Antimicrobial Susceptibility Comment  Final    Comment:       ** S = Susceptible; I = Intermediate; R = Resistant **                    P = Positive; N = Negative             MICS are expressed in micrograms per mL    Antibiotic                 RSLT#1    RSLT#2    RSLT#3    RSLT#4 Ciprofloxacin                  S Clindamycin                    S Erythromycin                   S Gentamicin                     S Levofloxacin                   S Linezolid                      S Oxacillin                      R Penicillin                     R Rifampin                       S Tetracycline                   S Trimethoprim/Sulfa             S Vancomycin                     S     Labs: CBC: Recent Labs  Lab 11/20/22 1239 11/21/22 0503 11/23/22 0506  WBC 11.8* 8.5 8.0  HGB 15.2 13.8 11.7*  HCT 42.9 38.8* 34.0*  MCV 81.6 82.0 82.9  PLT 210 156 132*   Basic Metabolic Panel: Recent Labs  Lab 11/20/22 1239 11/21/22 0503  11/23/22 0506  NA 140 138 137  K 4.5 4.2 3.8  CL 104 105 108  CO2 21* 24 23  GLUCOSE 147* 79 83  BUN 41* 47* 24*  CREATININE 1.72* 1.74* 1.10  CALCIUM 9.9 9.0 8.9   Liver Function Tests: Recent Labs  Lab 11/20/22 1239  AST 25  ALT 15  ALKPHOS 78  BILITOT 1.3*  PROT 8.3*  ALBUMIN 3.6   CBG: Recent Labs  Lab 11/23/22 1630 11/23/22 2124 11/23/22 2156 11/23/22 2229 11/24/22 0818  GLUCAP 70 33* 39* 152* 77    Discharge time spent: greater than 30 minutes.  Signed: Delfino Lovett, MD [facility] 11/24/2022

## 2022-11-24 NOTE — Consult Note (Signed)
[facility] Customer service manager [facility]) Accountable Care Organization (ACO) [facility] Liaison Note  11/24/2022  [name] [name] 07-26-1940 [id number]  Location: [facility] RN Hospital Liaison screened the patient remotely at [facility].  Insurance: [name]   [name] is a 82 y.o. male who is a Primary Care Patient of Luciana Axe, NP. The patient was screened for 30 day readmission hospitalization with noted medium risk score for unplanned readmission risk with 3 IP/0 ED in 6 months.  The patient was assessed for potential [facility]) Care Management service needs for post hospital transition for care coordination. Review of patient's electronic medical record reveals patient was admitted with AKI.  Hospital liaison attempted outreach call to the spouse due to pt's condition however unsuccessful. Based upon pt's pending discharge disposition will refer due to risk for readmission. Recommendation for [facility] if eligible for services.  Plan: [facility] Liaison will continue to follow progress and disposition to asess for post hospital community care coordination/management needs.  Referral request for community care coordination: pending disposition.   [facility] Care Management/Population Health does not replace or interfere with any arrangements made by the Inpatient Transition of Care team.   For questions contact:  Elliot Cousin, RN, [facility] Liaison [place]   Population Health Office Hours MTWF  8:00 am-6:00 pm Off on Thursday [phone] mobile [phone] [Office toll free line] Office Hours are M-F 8:30 - 5 pm 24 hour nurse advise line [phone] [email or web address]  [email or web address]

## 2022-11-24 NOTE — Progress Notes (Signed)
Patient was discharged with wife and other family member present. Discharge instructions were explained to wife and I verified understanding with teach back method. Patients family provided transport and all personal belongings were packed and given to the patients wife. Patient was hoyer lifted from bed to personal wheelchair and given a lap belt for safety per family members request and shown how to use it.

## 2022-11-27 ENCOUNTER — Telehealth: Payer: Self-pay | Admitting: *Deleted

## 2022-11-27 NOTE — Progress Notes (Unsigned)
  Care Coordination  Outreach Note  11/27/2022 Name: [name] [name] MRN: [id number] DOB: [birth year]   Care Coordination Outreach Attempts: An unsuccessful telephone outreach was attempted today to offer the patient information about available care coordination services.  Follow Up Plan:  Additional outreach attempts will be made to offer the patient care coordination information and services.   Encounter Outcome:  No Answer  Burman Nieves, CCMA Care Coordination Care Guide Direct Dial: [phone]

## 2022-11-28 DIAGNOSIS — G9340 Encephalopathy, unspecified: Secondary | ICD-10-CM | POA: Diagnosis not present

## 2022-11-28 DIAGNOSIS — N182 Chronic kidney disease, stage 2 (mild): Secondary | ICD-10-CM | POA: Diagnosis not present

## 2022-11-28 DIAGNOSIS — D631 Anemia in chronic kidney disease: Secondary | ICD-10-CM | POA: Diagnosis not present

## 2022-11-28 DIAGNOSIS — E1122 Type 2 diabetes mellitus with diabetic chronic kidney disease: Secondary | ICD-10-CM | POA: Diagnosis not present

## 2022-11-28 DIAGNOSIS — N179 Acute kidney failure, unspecified: Secondary | ICD-10-CM | POA: Diagnosis not present

## 2022-11-28 DIAGNOSIS — I5032 Chronic diastolic (congestive) heart failure: Secondary | ICD-10-CM | POA: Diagnosis not present

## 2022-11-28 DIAGNOSIS — I130 Hypertensive heart and chronic kidney disease with heart failure and stage 1 through stage 4 chronic kidney disease, or unspecified chronic kidney disease: Secondary | ICD-10-CM | POA: Diagnosis not present

## 2022-11-28 DIAGNOSIS — I69398 Other sequelae of cerebral infarction: Secondary | ICD-10-CM | POA: Diagnosis not present

## 2022-11-28 DIAGNOSIS — M6281 Muscle weakness (generalized): Secondary | ICD-10-CM | POA: Diagnosis not present

## 2022-11-28 NOTE — Progress Notes (Signed)
  Care Coordination   Note   11/28/2022 Name: [name] [name] MRN: [id number] DOB: Jul 23, 1940  [name] [name] is a 82 y.o. year old male who sees Luciana Axe, NP for primary care. I reached out to [employer] by phone today to offer care coordination services.  [name] [name] was given information about Care Coordination services today including:   The Care Coordination services include support from the care team which includes your Nurse Coordinator, Clinical Social Worker, or Pharmacist.  The Care Coordination team is here to help remove barriers to the health concerns and goals most important to you. Care Coordination services are voluntary, and the patient may decline or stop services at any time by request to their care team member.   Care Coordination Consent Status: Patient agreed to services and verbal consent obtained.   Follow up plan:  Telephone appointment with care coordination team member scheduled for:  12/06/2022  Encounter Outcome:  Pt. Scheduled from referral   Burman Nieves, [facility] Care Coordination Care Guide Direct Dial: [phone]

## 2022-11-29 DIAGNOSIS — D631 Anemia in chronic kidney disease: Secondary | ICD-10-CM | POA: Diagnosis not present

## 2022-11-29 DIAGNOSIS — I130 Hypertensive heart and chronic kidney disease with heart failure and stage 1 through stage 4 chronic kidney disease, or unspecified chronic kidney disease: Secondary | ICD-10-CM | POA: Diagnosis not present

## 2022-11-29 DIAGNOSIS — N182 Chronic kidney disease, stage 2 (mild): Secondary | ICD-10-CM | POA: Diagnosis not present

## 2022-11-29 DIAGNOSIS — I69398 Other sequelae of cerebral infarction: Secondary | ICD-10-CM | POA: Diagnosis not present

## 2022-11-29 DIAGNOSIS — I5032 Chronic diastolic (congestive) heart failure: Secondary | ICD-10-CM | POA: Diagnosis not present

## 2022-11-29 DIAGNOSIS — N179 Acute kidney failure, unspecified: Secondary | ICD-10-CM | POA: Diagnosis not present

## 2022-11-29 DIAGNOSIS — M6281 Muscle weakness (generalized): Secondary | ICD-10-CM | POA: Diagnosis not present

## 2022-11-29 DIAGNOSIS — E1122 Type 2 diabetes mellitus with diabetic chronic kidney disease: Secondary | ICD-10-CM | POA: Diagnosis not present

## 2022-11-29 DIAGNOSIS — G9340 Encephalopathy, unspecified: Secondary | ICD-10-CM | POA: Diagnosis not present

## 2022-11-30 DIAGNOSIS — E1122 Type 2 diabetes mellitus with diabetic chronic kidney disease: Secondary | ICD-10-CM | POA: Diagnosis not present

## 2022-11-30 DIAGNOSIS — I5032 Chronic diastolic (congestive) heart failure: Secondary | ICD-10-CM | POA: Diagnosis not present

## 2022-11-30 DIAGNOSIS — G9340 Encephalopathy, unspecified: Secondary | ICD-10-CM | POA: Diagnosis not present

## 2022-11-30 DIAGNOSIS — M6281 Muscle weakness (generalized): Secondary | ICD-10-CM | POA: Diagnosis not present

## 2022-11-30 DIAGNOSIS — D631 Anemia in chronic kidney disease: Secondary | ICD-10-CM | POA: Diagnosis not present

## 2022-11-30 DIAGNOSIS — N179 Acute kidney failure, unspecified: Secondary | ICD-10-CM | POA: Diagnosis not present

## 2022-11-30 DIAGNOSIS — N182 Chronic kidney disease, stage 2 (mild): Secondary | ICD-10-CM | POA: Diagnosis not present

## 2022-11-30 DIAGNOSIS — I130 Hypertensive heart and chronic kidney disease with heart failure and stage 1 through stage 4 chronic kidney disease, or unspecified chronic kidney disease: Secondary | ICD-10-CM | POA: Diagnosis not present

## 2022-11-30 DIAGNOSIS — I69398 Other sequelae of cerebral infarction: Secondary | ICD-10-CM | POA: Diagnosis not present

## 2022-12-04 DIAGNOSIS — I5032 Chronic diastolic (congestive) heart failure: Secondary | ICD-10-CM | POA: Diagnosis not present

## 2022-12-04 DIAGNOSIS — E1122 Type 2 diabetes mellitus with diabetic chronic kidney disease: Secondary | ICD-10-CM | POA: Diagnosis not present

## 2022-12-04 DIAGNOSIS — I69398 Other sequelae of cerebral infarction: Secondary | ICD-10-CM | POA: Diagnosis not present

## 2022-12-04 DIAGNOSIS — N182 Chronic kidney disease, stage 2 (mild): Secondary | ICD-10-CM | POA: Diagnosis not present

## 2022-12-04 DIAGNOSIS — D631 Anemia in chronic kidney disease: Secondary | ICD-10-CM | POA: Diagnosis not present

## 2022-12-04 DIAGNOSIS — I130 Hypertensive heart and chronic kidney disease with heart failure and stage 1 through stage 4 chronic kidney disease, or unspecified chronic kidney disease: Secondary | ICD-10-CM | POA: Diagnosis not present

## 2022-12-04 DIAGNOSIS — N179 Acute kidney failure, unspecified: Secondary | ICD-10-CM | POA: Diagnosis not present

## 2022-12-04 DIAGNOSIS — G9340 Encephalopathy, unspecified: Secondary | ICD-10-CM | POA: Diagnosis not present

## 2022-12-04 DIAGNOSIS — M6281 Muscle weakness (generalized): Secondary | ICD-10-CM | POA: Diagnosis not present

## 2022-12-05 DIAGNOSIS — N182 Chronic kidney disease, stage 2 (mild): Secondary | ICD-10-CM | POA: Diagnosis not present

## 2022-12-05 DIAGNOSIS — D631 Anemia in chronic kidney disease: Secondary | ICD-10-CM | POA: Diagnosis not present

## 2022-12-05 DIAGNOSIS — I5032 Chronic diastolic (congestive) heart failure: Secondary | ICD-10-CM | POA: Diagnosis not present

## 2022-12-05 DIAGNOSIS — M6281 Muscle weakness (generalized): Secondary | ICD-10-CM | POA: Diagnosis not present

## 2022-12-05 DIAGNOSIS — I130 Hypertensive heart and chronic kidney disease with heart failure and stage 1 through stage 4 chronic kidney disease, or unspecified chronic kidney disease: Secondary | ICD-10-CM | POA: Diagnosis not present

## 2022-12-05 DIAGNOSIS — N179 Acute kidney failure, unspecified: Secondary | ICD-10-CM | POA: Diagnosis not present

## 2022-12-05 DIAGNOSIS — I69398 Other sequelae of cerebral infarction: Secondary | ICD-10-CM | POA: Diagnosis not present

## 2022-12-05 DIAGNOSIS — G9340 Encephalopathy, unspecified: Secondary | ICD-10-CM | POA: Diagnosis not present

## 2022-12-05 DIAGNOSIS — E1122 Type 2 diabetes mellitus with diabetic chronic kidney disease: Secondary | ICD-10-CM | POA: Diagnosis not present

## 2022-12-06 ENCOUNTER — Ambulatory Visit: Payer: Self-pay | Admitting: *Deleted

## 2022-12-06 DIAGNOSIS — N182 Chronic kidney disease, stage 2 (mild): Secondary | ICD-10-CM | POA: Diagnosis not present

## 2022-12-06 DIAGNOSIS — D631 Anemia in chronic kidney disease: Secondary | ICD-10-CM | POA: Diagnosis not present

## 2022-12-06 DIAGNOSIS — I69398 Other sequelae of cerebral infarction: Secondary | ICD-10-CM | POA: Diagnosis not present

## 2022-12-06 DIAGNOSIS — N179 Acute kidney failure, unspecified: Secondary | ICD-10-CM | POA: Diagnosis not present

## 2022-12-06 DIAGNOSIS — G9340 Encephalopathy, unspecified: Secondary | ICD-10-CM | POA: Diagnosis not present

## 2022-12-06 DIAGNOSIS — E1122 Type 2 diabetes mellitus with diabetic chronic kidney disease: Secondary | ICD-10-CM | POA: Diagnosis not present

## 2022-12-06 DIAGNOSIS — I130 Hypertensive heart and chronic kidney disease with heart failure and stage 1 through stage 4 chronic kidney disease, or unspecified chronic kidney disease: Secondary | ICD-10-CM | POA: Diagnosis not present

## 2022-12-06 DIAGNOSIS — I5032 Chronic diastolic (congestive) heart failure: Secondary | ICD-10-CM | POA: Diagnosis not present

## 2022-12-06 DIAGNOSIS — M6281 Muscle weakness (generalized): Secondary | ICD-10-CM | POA: Diagnosis not present

## 2022-12-06 NOTE — Patient Outreach (Signed)
  Care Coordination   Follow Up Visit Note   12/07/2022 Name: Christ Kirn MRN: [id number] DOB: 1940-10-08  Lakhi Dils is a 82 y.o. year old male who sees Luciana Axe, NP for primary care. I spoke with Pattricia Boss, wife of Gerell Dimitri by phone today.  What matters to the patients health and wellness today?  Per wife, increase strength.  Active with CCM team with PCP office.    Goals Addressed             This Visit's Progress    COMPLETED: Care Coordination Activities - No follow up needed       Interventions Today    Flowsheet Row Most Recent Value  Chronic Disease   Chronic disease during today's visit Hypertension (HTN), Congestive Heart Failure (CHF), Chronic Kidney Disease/End Stage Renal Disease (ESRD), Other  [Recent stroke]  General Interventions   General Interventions Discussed/Reviewed General Interventions Reviewed, Doctor Visits, [employer], Horticulturist, commercial (DME)  Jorje Guild with [facility] for HH,PT/OT/RN]  Doctor Visits Discussed/Reviewed PCP, Doctor Visits Reviewed  [facility] scheduled for tomorrow, Cardiology on 9/18]  Durable Medical Equipment (DME) Wheelchair, Bed side commode, Programme researcher, broadcasting/film/video bed]  Wheelchair Standard  PCP/Specialist Visits Compliance with follow-up visit  Education Interventions   Education Provided Provided Education  Provided Verbal Education On Medication, When to see the doctor, [facility]  [Encouarged to remain active with [facility] team, uses family for transportation to MD appointments.]  Advanced Directive Interventions   Advanced Directives Discussed/Reviewed End of Life  End of Life Palliative  Jorje Guild with Physicist, medical, encouraged by MD to consider hospice]              SDOH assessments and interventions completed:  Yes  SDOH Interventions Today    Flowsheet Row Most Recent Value  SDOH Interventions   Food Insecurity Interventions Intervention Not Indicated  Housing Interventions Intervention Not Indicated   Transportation Interventions Intervention Not Indicated        Care Coordination Interventions:  Yes, provided   Follow up plan: No further intervention required.   Encounter Outcome:  Pt. Visit Completed   Kemper Durie, RN, MSN, [facility] Care Management Care Management Coordinator [phone]

## 2022-12-07 ENCOUNTER — Encounter: Payer: Self-pay | Admitting: *Deleted

## 2022-12-07 DIAGNOSIS — D631 Anemia in chronic kidney disease: Secondary | ICD-10-CM | POA: Diagnosis not present

## 2022-12-07 DIAGNOSIS — Z89612 Acquired absence of left leg above knee: Secondary | ICD-10-CM | POA: Diagnosis not present

## 2022-12-07 DIAGNOSIS — E1142 Type 2 diabetes mellitus with diabetic polyneuropathy: Secondary | ICD-10-CM | POA: Diagnosis not present

## 2022-12-07 DIAGNOSIS — N179 Acute kidney failure, unspecified: Secondary | ICD-10-CM | POA: Diagnosis not present

## 2022-12-07 DIAGNOSIS — M6281 Muscle weakness (generalized): Secondary | ICD-10-CM | POA: Diagnosis not present

## 2022-12-07 DIAGNOSIS — Z7189 Other specified counseling: Secondary | ICD-10-CM | POA: Diagnosis not present

## 2022-12-07 DIAGNOSIS — I4891 Unspecified atrial fibrillation: Secondary | ICD-10-CM | POA: Diagnosis not present

## 2022-12-07 DIAGNOSIS — E1122 Type 2 diabetes mellitus with diabetic chronic kidney disease: Secondary | ICD-10-CM | POA: Diagnosis not present

## 2022-12-07 DIAGNOSIS — I130 Hypertensive heart and chronic kidney disease with heart failure and stage 1 through stage 4 chronic kidney disease, or unspecified chronic kidney disease: Secondary | ICD-10-CM | POA: Diagnosis not present

## 2022-12-07 DIAGNOSIS — I5032 Chronic diastolic (congestive) heart failure: Secondary | ICD-10-CM | POA: Diagnosis not present

## 2022-12-07 DIAGNOSIS — Z89611 Acquired absence of right leg above knee: Secondary | ICD-10-CM | POA: Diagnosis not present

## 2022-12-07 DIAGNOSIS — Z09 Encounter for follow-up examination after completed treatment for conditions other than malignant neoplasm: Secondary | ICD-10-CM | POA: Diagnosis not present

## 2022-12-07 DIAGNOSIS — I69398 Other sequelae of cerebral infarction: Secondary | ICD-10-CM | POA: Diagnosis not present

## 2022-12-07 DIAGNOSIS — Z8673 Personal history of transient ischemic attack (TIA), and cerebral infarction without residual deficits: Secondary | ICD-10-CM | POA: Diagnosis not present

## 2022-12-07 DIAGNOSIS — N182 Chronic kidney disease, stage 2 (mild): Secondary | ICD-10-CM | POA: Diagnosis not present

## 2022-12-07 DIAGNOSIS — I110 Hypertensive heart disease with heart failure: Secondary | ICD-10-CM | POA: Diagnosis not present

## 2022-12-07 DIAGNOSIS — G9340 Encephalopathy, unspecified: Secondary | ICD-10-CM | POA: Diagnosis not present

## 2022-12-08 DIAGNOSIS — I739 Peripheral vascular disease, unspecified: Secondary | ICD-10-CM | POA: Diagnosis not present

## 2022-12-08 DIAGNOSIS — E785 Hyperlipidemia, unspecified: Secondary | ICD-10-CM | POA: Diagnosis not present

## 2022-12-08 DIAGNOSIS — I10 Essential (primary) hypertension: Secondary | ICD-10-CM | POA: Diagnosis not present

## 2022-12-08 DIAGNOSIS — I5032 Chronic diastolic (congestive) heart failure: Secondary | ICD-10-CM | POA: Diagnosis not present

## 2022-12-08 DIAGNOSIS — I4891 Unspecified atrial fibrillation: Secondary | ICD-10-CM | POA: Diagnosis not present

## 2022-12-11 DIAGNOSIS — I5032 Chronic diastolic (congestive) heart failure: Secondary | ICD-10-CM | POA: Diagnosis not present

## 2022-12-11 DIAGNOSIS — I69398 Other sequelae of cerebral infarction: Secondary | ICD-10-CM | POA: Diagnosis not present

## 2022-12-11 DIAGNOSIS — G9340 Encephalopathy, unspecified: Secondary | ICD-10-CM | POA: Diagnosis not present

## 2022-12-11 DIAGNOSIS — N179 Acute kidney failure, unspecified: Secondary | ICD-10-CM | POA: Diagnosis not present

## 2022-12-11 DIAGNOSIS — M6281 Muscle weakness (generalized): Secondary | ICD-10-CM | POA: Diagnosis not present

## 2022-12-11 DIAGNOSIS — D631 Anemia in chronic kidney disease: Secondary | ICD-10-CM | POA: Diagnosis not present

## 2022-12-11 DIAGNOSIS — N182 Chronic kidney disease, stage 2 (mild): Secondary | ICD-10-CM | POA: Diagnosis not present

## 2022-12-11 DIAGNOSIS — I130 Hypertensive heart and chronic kidney disease with heart failure and stage 1 through stage 4 chronic kidney disease, or unspecified chronic kidney disease: Secondary | ICD-10-CM | POA: Diagnosis not present

## 2022-12-11 DIAGNOSIS — E1122 Type 2 diabetes mellitus with diabetic chronic kidney disease: Secondary | ICD-10-CM | POA: Diagnosis not present

## 2022-12-12 DIAGNOSIS — N182 Chronic kidney disease, stage 2 (mild): Secondary | ICD-10-CM | POA: Diagnosis not present

## 2022-12-12 DIAGNOSIS — E1122 Type 2 diabetes mellitus with diabetic chronic kidney disease: Secondary | ICD-10-CM | POA: Diagnosis not present

## 2022-12-12 DIAGNOSIS — I130 Hypertensive heart and chronic kidney disease with heart failure and stage 1 through stage 4 chronic kidney disease, or unspecified chronic kidney disease: Secondary | ICD-10-CM | POA: Diagnosis not present

## 2022-12-12 DIAGNOSIS — M6281 Muscle weakness (generalized): Secondary | ICD-10-CM | POA: Diagnosis not present

## 2022-12-12 DIAGNOSIS — N179 Acute kidney failure, unspecified: Secondary | ICD-10-CM | POA: Diagnosis not present

## 2022-12-12 DIAGNOSIS — I69398 Other sequelae of cerebral infarction: Secondary | ICD-10-CM | POA: Diagnosis not present

## 2022-12-12 DIAGNOSIS — I5032 Chronic diastolic (congestive) heart failure: Secondary | ICD-10-CM | POA: Diagnosis not present

## 2022-12-12 DIAGNOSIS — G9340 Encephalopathy, unspecified: Secondary | ICD-10-CM | POA: Diagnosis not present

## 2022-12-12 DIAGNOSIS — D631 Anemia in chronic kidney disease: Secondary | ICD-10-CM | POA: Diagnosis not present

## 2022-12-14 DIAGNOSIS — M6281 Muscle weakness (generalized): Secondary | ICD-10-CM | POA: Diagnosis not present

## 2022-12-14 DIAGNOSIS — I5032 Chronic diastolic (congestive) heart failure: Secondary | ICD-10-CM | POA: Diagnosis not present

## 2022-12-14 DIAGNOSIS — E1122 Type 2 diabetes mellitus with diabetic chronic kidney disease: Secondary | ICD-10-CM | POA: Diagnosis not present

## 2022-12-14 DIAGNOSIS — I130 Hypertensive heart and chronic kidney disease with heart failure and stage 1 through stage 4 chronic kidney disease, or unspecified chronic kidney disease: Secondary | ICD-10-CM | POA: Diagnosis not present

## 2022-12-14 DIAGNOSIS — N182 Chronic kidney disease, stage 2 (mild): Secondary | ICD-10-CM | POA: Diagnosis not present

## 2022-12-14 DIAGNOSIS — D631 Anemia in chronic kidney disease: Secondary | ICD-10-CM | POA: Diagnosis not present

## 2022-12-14 DIAGNOSIS — J45909 Unspecified asthma, uncomplicated: Secondary | ICD-10-CM | POA: Diagnosis not present

## 2022-12-14 DIAGNOSIS — I69398 Other sequelae of cerebral infarction: Secondary | ICD-10-CM | POA: Diagnosis not present

## 2022-12-14 DIAGNOSIS — N179 Acute kidney failure, unspecified: Secondary | ICD-10-CM | POA: Diagnosis not present

## 2022-12-18 DIAGNOSIS — I69398 Other sequelae of cerebral infarction: Secondary | ICD-10-CM | POA: Diagnosis not present

## 2022-12-18 DIAGNOSIS — D631 Anemia in chronic kidney disease: Secondary | ICD-10-CM | POA: Diagnosis not present

## 2022-12-18 DIAGNOSIS — M6281 Muscle weakness (generalized): Secondary | ICD-10-CM | POA: Diagnosis not present

## 2022-12-18 DIAGNOSIS — J45909 Unspecified asthma, uncomplicated: Secondary | ICD-10-CM | POA: Diagnosis not present

## 2022-12-18 DIAGNOSIS — I5032 Chronic diastolic (congestive) heart failure: Secondary | ICD-10-CM | POA: Diagnosis not present

## 2022-12-18 DIAGNOSIS — E1122 Type 2 diabetes mellitus with diabetic chronic kidney disease: Secondary | ICD-10-CM | POA: Diagnosis not present

## 2022-12-18 DIAGNOSIS — N179 Acute kidney failure, unspecified: Secondary | ICD-10-CM | POA: Diagnosis not present

## 2022-12-18 DIAGNOSIS — I130 Hypertensive heart and chronic kidney disease with heart failure and stage 1 through stage 4 chronic kidney disease, or unspecified chronic kidney disease: Secondary | ICD-10-CM | POA: Diagnosis not present

## 2022-12-18 DIAGNOSIS — N182 Chronic kidney disease, stage 2 (mild): Secondary | ICD-10-CM | POA: Diagnosis not present

## 2022-12-19 DIAGNOSIS — D631 Anemia in chronic kidney disease: Secondary | ICD-10-CM | POA: Diagnosis not present

## 2022-12-19 DIAGNOSIS — I130 Hypertensive heart and chronic kidney disease with heart failure and stage 1 through stage 4 chronic kidney disease, or unspecified chronic kidney disease: Secondary | ICD-10-CM | POA: Diagnosis not present

## 2022-12-19 DIAGNOSIS — M6281 Muscle weakness (generalized): Secondary | ICD-10-CM | POA: Diagnosis not present

## 2022-12-19 DIAGNOSIS — N182 Chronic kidney disease, stage 2 (mild): Secondary | ICD-10-CM | POA: Diagnosis not present

## 2022-12-19 DIAGNOSIS — E1122 Type 2 diabetes mellitus with diabetic chronic kidney disease: Secondary | ICD-10-CM | POA: Diagnosis not present

## 2022-12-19 DIAGNOSIS — I5032 Chronic diastolic (congestive) heart failure: Secondary | ICD-10-CM | POA: Diagnosis not present

## 2022-12-19 DIAGNOSIS — I69398 Other sequelae of cerebral infarction: Secondary | ICD-10-CM | POA: Diagnosis not present

## 2022-12-19 DIAGNOSIS — J45909 Unspecified asthma, uncomplicated: Secondary | ICD-10-CM | POA: Diagnosis not present

## 2022-12-19 DIAGNOSIS — N179 Acute kidney failure, unspecified: Secondary | ICD-10-CM | POA: Diagnosis not present

## 2022-12-21 DIAGNOSIS — I130 Hypertensive heart and chronic kidney disease with heart failure and stage 1 through stage 4 chronic kidney disease, or unspecified chronic kidney disease: Secondary | ICD-10-CM | POA: Diagnosis not present

## 2022-12-21 DIAGNOSIS — I69398 Other sequelae of cerebral infarction: Secondary | ICD-10-CM | POA: Diagnosis not present

## 2022-12-21 DIAGNOSIS — M6281 Muscle weakness (generalized): Secondary | ICD-10-CM | POA: Diagnosis not present

## 2022-12-21 DIAGNOSIS — N182 Chronic kidney disease, stage 2 (mild): Secondary | ICD-10-CM | POA: Diagnosis not present

## 2022-12-21 DIAGNOSIS — N179 Acute kidney failure, unspecified: Secondary | ICD-10-CM | POA: Diagnosis not present

## 2022-12-21 DIAGNOSIS — J45909 Unspecified asthma, uncomplicated: Secondary | ICD-10-CM | POA: Diagnosis not present

## 2022-12-21 DIAGNOSIS — E1122 Type 2 diabetes mellitus with diabetic chronic kidney disease: Secondary | ICD-10-CM | POA: Diagnosis not present

## 2022-12-21 DIAGNOSIS — I5032 Chronic diastolic (congestive) heart failure: Secondary | ICD-10-CM | POA: Diagnosis not present

## 2022-12-21 DIAGNOSIS — D631 Anemia in chronic kidney disease: Secondary | ICD-10-CM | POA: Diagnosis not present

## 2022-12-26 DIAGNOSIS — I130 Hypertensive heart and chronic kidney disease with heart failure and stage 1 through stage 4 chronic kidney disease, or unspecified chronic kidney disease: Secondary | ICD-10-CM | POA: Diagnosis not present

## 2022-12-26 DIAGNOSIS — I69398 Other sequelae of cerebral infarction: Secondary | ICD-10-CM | POA: Diagnosis not present

## 2022-12-26 DIAGNOSIS — I5032 Chronic diastolic (congestive) heart failure: Secondary | ICD-10-CM | POA: Diagnosis not present

## 2022-12-26 DIAGNOSIS — J45909 Unspecified asthma, uncomplicated: Secondary | ICD-10-CM | POA: Diagnosis not present

## 2022-12-26 DIAGNOSIS — N179 Acute kidney failure, unspecified: Secondary | ICD-10-CM | POA: Diagnosis not present

## 2022-12-26 DIAGNOSIS — D631 Anemia in chronic kidney disease: Secondary | ICD-10-CM | POA: Diagnosis not present

## 2022-12-26 DIAGNOSIS — E1122 Type 2 diabetes mellitus with diabetic chronic kidney disease: Secondary | ICD-10-CM | POA: Diagnosis not present

## 2022-12-26 DIAGNOSIS — N182 Chronic kidney disease, stage 2 (mild): Secondary | ICD-10-CM | POA: Diagnosis not present

## 2022-12-26 DIAGNOSIS — M6281 Muscle weakness (generalized): Secondary | ICD-10-CM | POA: Diagnosis not present

## 2022-12-27 DIAGNOSIS — M6281 Muscle weakness (generalized): Secondary | ICD-10-CM | POA: Diagnosis not present

## 2022-12-27 DIAGNOSIS — D631 Anemia in chronic kidney disease: Secondary | ICD-10-CM | POA: Diagnosis not present

## 2022-12-27 DIAGNOSIS — J45909 Unspecified asthma, uncomplicated: Secondary | ICD-10-CM | POA: Diagnosis not present

## 2022-12-27 DIAGNOSIS — N182 Chronic kidney disease, stage 2 (mild): Secondary | ICD-10-CM | POA: Diagnosis not present

## 2022-12-27 DIAGNOSIS — I130 Hypertensive heart and chronic kidney disease with heart failure and stage 1 through stage 4 chronic kidney disease, or unspecified chronic kidney disease: Secondary | ICD-10-CM | POA: Diagnosis not present

## 2022-12-27 DIAGNOSIS — N179 Acute kidney failure, unspecified: Secondary | ICD-10-CM | POA: Diagnosis not present

## 2022-12-27 DIAGNOSIS — I5032 Chronic diastolic (congestive) heart failure: Secondary | ICD-10-CM | POA: Diagnosis not present

## 2022-12-27 DIAGNOSIS — E1122 Type 2 diabetes mellitus with diabetic chronic kidney disease: Secondary | ICD-10-CM | POA: Diagnosis not present

## 2022-12-27 DIAGNOSIS — I69398 Other sequelae of cerebral infarction: Secondary | ICD-10-CM | POA: Diagnosis not present

## 2022-12-28 DIAGNOSIS — R4182 Altered mental status, unspecified: Secondary | ICD-10-CM | POA: Diagnosis not present

## 2022-12-28 DIAGNOSIS — Z8673 Personal history of transient ischemic attack (TIA), and cerebral infarction without residual deficits: Secondary | ICD-10-CM | POA: Diagnosis not present

## 2022-12-28 DIAGNOSIS — R29898 Other symptoms and signs involving the musculoskeletal system: Secondary | ICD-10-CM | POA: Diagnosis not present

## 2023-01-01 DIAGNOSIS — D631 Anemia in chronic kidney disease: Secondary | ICD-10-CM | POA: Diagnosis not present

## 2023-01-01 DIAGNOSIS — I5032 Chronic diastolic (congestive) heart failure: Secondary | ICD-10-CM | POA: Diagnosis not present

## 2023-01-01 DIAGNOSIS — M6281 Muscle weakness (generalized): Secondary | ICD-10-CM | POA: Diagnosis not present

## 2023-01-01 DIAGNOSIS — E1122 Type 2 diabetes mellitus with diabetic chronic kidney disease: Secondary | ICD-10-CM | POA: Diagnosis not present

## 2023-01-01 DIAGNOSIS — J45909 Unspecified asthma, uncomplicated: Secondary | ICD-10-CM | POA: Diagnosis not present

## 2023-01-01 DIAGNOSIS — N179 Acute kidney failure, unspecified: Secondary | ICD-10-CM | POA: Diagnosis not present

## 2023-01-01 DIAGNOSIS — I130 Hypertensive heart and chronic kidney disease with heart failure and stage 1 through stage 4 chronic kidney disease, or unspecified chronic kidney disease: Secondary | ICD-10-CM | POA: Diagnosis not present

## 2023-01-01 DIAGNOSIS — I69398 Other sequelae of cerebral infarction: Secondary | ICD-10-CM | POA: Diagnosis not present

## 2023-01-01 DIAGNOSIS — N182 Chronic kidney disease, stage 2 (mild): Secondary | ICD-10-CM | POA: Diagnosis not present

## 2023-01-03 DIAGNOSIS — N179 Acute kidney failure, unspecified: Secondary | ICD-10-CM | POA: Diagnosis not present

## 2023-01-03 DIAGNOSIS — N182 Chronic kidney disease, stage 2 (mild): Secondary | ICD-10-CM | POA: Diagnosis not present

## 2023-01-03 DIAGNOSIS — I69398 Other sequelae of cerebral infarction: Secondary | ICD-10-CM | POA: Diagnosis not present

## 2023-01-03 DIAGNOSIS — M6281 Muscle weakness (generalized): Secondary | ICD-10-CM | POA: Diagnosis not present

## 2023-01-03 DIAGNOSIS — E1122 Type 2 diabetes mellitus with diabetic chronic kidney disease: Secondary | ICD-10-CM | POA: Diagnosis not present

## 2023-01-03 DIAGNOSIS — J45909 Unspecified asthma, uncomplicated: Secondary | ICD-10-CM | POA: Diagnosis not present

## 2023-01-03 DIAGNOSIS — D631 Anemia in chronic kidney disease: Secondary | ICD-10-CM | POA: Diagnosis not present

## 2023-01-03 DIAGNOSIS — I5032 Chronic diastolic (congestive) heart failure: Secondary | ICD-10-CM | POA: Diagnosis not present

## 2023-01-03 DIAGNOSIS — I130 Hypertensive heart and chronic kidney disease with heart failure and stage 1 through stage 4 chronic kidney disease, or unspecified chronic kidney disease: Secondary | ICD-10-CM | POA: Diagnosis not present

## 2023-01-04 ENCOUNTER — Ambulatory Visit (INDEPENDENT_AMBULATORY_CARE_PROVIDER_SITE_OTHER): Payer: Medicare HMO

## 2023-01-04 ENCOUNTER — Ambulatory Visit
Admission: EM | Admit: 2023-01-04 | Discharge: 2023-01-04 | Disposition: A | Discharge status: Home / Self Care | Payer: Medicare HMO | Attending: Emergency Medicine | Admitting: Emergency Medicine

## 2023-01-04 DIAGNOSIS — M25522 Pain in left elbow: Secondary | ICD-10-CM | POA: Diagnosis not present

## 2023-01-04 DIAGNOSIS — N179 Acute kidney failure, unspecified: Secondary | ICD-10-CM | POA: Diagnosis not present

## 2023-01-04 DIAGNOSIS — I5032 Chronic diastolic (congestive) heart failure: Secondary | ICD-10-CM | POA: Diagnosis not present

## 2023-01-04 DIAGNOSIS — I69398 Other sequelae of cerebral infarction: Secondary | ICD-10-CM | POA: Diagnosis not present

## 2023-01-04 DIAGNOSIS — J45909 Unspecified asthma, uncomplicated: Secondary | ICD-10-CM | POA: Diagnosis not present

## 2023-01-04 DIAGNOSIS — D631 Anemia in chronic kidney disease: Secondary | ICD-10-CM | POA: Diagnosis not present

## 2023-01-04 DIAGNOSIS — S5002XA Contusion of left elbow, initial encounter: Secondary | ICD-10-CM

## 2023-01-04 DIAGNOSIS — M19032 Primary osteoarthritis, left wrist: Secondary | ICD-10-CM | POA: Diagnosis not present

## 2023-01-04 DIAGNOSIS — N182 Chronic kidney disease, stage 2 (mild): Secondary | ICD-10-CM | POA: Diagnosis not present

## 2023-01-04 DIAGNOSIS — M25532 Pain in left wrist: Secondary | ICD-10-CM

## 2023-01-04 DIAGNOSIS — M6281 Muscle weakness (generalized): Secondary | ICD-10-CM | POA: Diagnosis not present

## 2023-01-04 DIAGNOSIS — S60212A Contusion of left wrist, initial encounter: Secondary | ICD-10-CM | POA: Diagnosis not present

## 2023-01-04 DIAGNOSIS — E1122 Type 2 diabetes mellitus with diabetic chronic kidney disease: Secondary | ICD-10-CM | POA: Diagnosis not present

## 2023-01-04 DIAGNOSIS — I130 Hypertensive heart and chronic kidney disease with heart failure and stage 1 through stage 4 chronic kidney disease, or unspecified chronic kidney disease: Secondary | ICD-10-CM | POA: Diagnosis not present

## 2023-01-04 NOTE — ED Triage Notes (Addendum)
Pt c/o left hand soreness, bruising, and swelling x1day  Pt fell out of a hospital bed twice on 9.11.24  Pt has opaque fingernails and will not read a pulse. Pt continues to move arms and Bp comes out elevated   - 144/132 - 127/101 -194/113

## 2023-01-04 NOTE — ED Provider Notes (Signed)
[facility]    CSN: [id number] Arrival date & time: 01/04/23  1740      History   Chief Complaint Chief Complaint  Patient presents with   Hand Pain         HPI [name] [name] is a 82 y.o. male.   HPI  82 year old male with significant vasculopath history, CHF, diabetes, atrial fibrillation, hypertension, and bilateral AKA presents for evaluation of pain and swelling to the left wrist.  He fell out of the bed twice yesterday and he is complaining of pain and swelling to the left hand and wrist.  Caregiver is also concerned about his left elbow.  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   CVA (cerebral vascular accident) (HCC) 10/26/2022   Sepsis due to undetermined organism (HCC) 08/10/2022   Cellulitis of second toe of left foot 08/10/2022   Generalized weakness 08/10/2022   Sepsis (HCC) 08/10/2022   Gangrene (HCC) 08/10/2022   AKI (acute kidney injury) (HCC) 08/10/2022   Hx of AKA (above knee amputation), 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   Acute blood loss anemia    Anemia of chronic disease    Leukocytosis    Chronic diastolic CHF (congestive heart failure) (HCC)    Moderate malnutrition (HCC) 03/11/2021   Acute osteomyelitis of right ankle (HCC) 03/08/2021   Open wound of right ankle 03/08/2021   Lactic acidosis 03/08/2021   Hypoalbuminemia due to protein-calorie malnutrition (HCC) 03/08/2021   Hyperglycemia due to diabetes mellitus (HCC) 03/08/2021   Hyponatremia 03/08/2021   Mixed hyperlipidemia 03/08/2021   Peripheral arterial disease (HCC) 03/08/2021   CHF (congestive heart failure) (HCC)  02/02/2021   Type 2 diabetes mellitus with peripheral neuropathy (HCC) 02/02/2021   Right leg weakness 11/19/2019   Vitamin B12 deficiency 05/20/2018   Vitamin D deficiency 05/20/2018   Cardiomyopathy (HCC) 07/29/2013   Asthma 02/19/2012   Essential hypertension 02/19/2012   Atrial fibrillation (HCC) 10/27/2011   GERD (gastroesophageal reflux disease) 10/27/2011   Dyslipidemia 10/27/2011    Past Surgical History:  Procedure Laterality Date   ABOVE KNEE LEG AMPUTATION Bilateral    AMPUTATION Right 03/09/2021   Procedure: AMPUTATION FOOT;  Surgeon: Annice Needy, MD;  Location: [facility] ORS;  Service: General;  Laterality: Right;   AMPUTATION Right 03/11/2021   Procedure: AMPUTATION ABOVE KNEE;  Surgeon: Annice Needy, MD;  Location: [facility];  Service: General;  Laterality: Right;   AMPUTATION Left 08/13/2022   Procedure: AMPUTATION ABOVE KNEE;  Surgeon: Bertram Denver, MD;  Location: [facility];  Service: Vascular;  Laterality: Left;   CATARACT EXTRACTION W/PHACO Right 03/08/2017   Procedure: CATARACT EXTRACTION PHACO AND INTRAOCULAR LENS PLACEMENT (IOC);  Surgeon: Nevada Crane, MD;  Location: [facility];  Service: Ophthalmology;  Laterality: Right;  Lot# [id number] H [place]: 00:30.9 AP%: 8.2 CDE: 2.54   CATARACT EXTRACTION W/PHACO Left 04/04/2017   Procedure: CATARACT EXTRACTION PHACO AND INTRAOCULAR LENS PLACEMENT (IOC);  Surgeon: Nevada Crane, MD;  Location: [facility];  Service: Ophthalmology;  Laterality: Left;  [place] 00:31.4 AP% 12.0 CDE 3.78 Fluid Pack lot # [id number]  CHOLECYSTECTOMY     EYE SURGERY     right eye   LOWER EXTREMITY ANGIOGRAPHY Right 02/10/2021   Procedure: LOWER EXTREMITY ANGIOGRAPHY;  Surgeon: Annice Needy, MD;  Location: [facility];  Service: Cardiovascular;  Laterality: Right;   LOWER EXTREMITY ANGIOGRAPHY Left 07/03/2022   Procedure: Lower Extremity Angiography;  Surgeon: Annice Needy, MD;  Location: [facility];  Service: Cardiovascular;   Laterality: Left;   LOWER EXTREMITY ANGIOGRAPHY Left 07/24/2022   Procedure: Lower Extremity Angiography;  Surgeon: Annice Needy, MD;  Location: [facility];  Service: Cardiovascular;  Laterality: Left;   LOWER EXTREMITY ANGIOGRAPHY Left 08/11/2022   Procedure: Lower Extremity Angiography;  Surgeon: Annice Needy, MD;  Location: [facility];  Service: Cardiovascular;  Laterality: Left;       Home Medications    Prior to Admission medications   Medication Sig Start Date End Date Taking? Authorizing Provider  ascorbic acid (VITAMIN C) 500 MG tablet Take 500 mg by mouth daily. 10/06/21  Yes [provider]  aspirin EC 81 MG tablet Take 1 tablet (81 mg total) by mouth daily. Swallow whole. 07/03/22 07/03/23 Yes Dew, Marlow Baars, MD  carvedilol (COREG) 6.25 MG tablet Take 1 tablet (6.25 mg total) by mouth 2 (two) times daily. HOLD THIS MEDICATION UNTIL PT SEES CARDIO SECONDARY TO BRADYCARDIA 11/02/22  Yes Charise Killian, MD  furosemide (LASIX) 20 MG tablet Take 20 mg daily by mouth. 12/19/16  Yes [provider]  lisinopril (PRINIVIL,ZESTRIL) 10 MG tablet Take 15 mg by mouth daily.   Yes [provider]  vitamin B-12 (CYANOCOBALAMIN) 1000 MCG tablet Take 1,000 mcg by mouth daily.   Yes [provider]  atorvastatin (LIPITOR) 10 MG tablet Take 1 tablet (10 mg total) by mouth daily. 02/10/21 11/20/22  Annice Needy, MD  ferrous sulfate 325 (65 FE) MG tablet Take 325 mg by mouth daily with breakfast. 05/11/21 11/20/22  [provider]  gabapentin (NEURONTIN) 100 MG capsule Take 1 capsule (100 mg total) by mouth 3 (three) times daily. 03/16/21 11/20/22  Charise Killian, MD    Family History History reviewed. No pertinent family history.  Social History Social History   Tobacco Use   Smoking status: Never   Smokeless tobacco: Current    Types: Chew  Vaping Use   Vaping status: Never Used  Substance Use Topics   Alcohol use: No     Alcohol/week: 0.0 standard drinks of alcohol   Drug use: No     Allergies   Patient has no known allergies.   Review of Systems Review of Systems  Constitutional:  Negative for fever.  Musculoskeletal:  Positive for arthralgias and joint swelling.  Skin:  Negative for color change.     Physical Exam Triage Vital Signs ED Triage Vitals  Encounter Vitals Group     BP      Systolic BP Percentile      Diastolic BP Percentile      Pulse      Resp      Temp      Temp src      SpO2      Weight      Height      Head Circumference      Peak Flow      Pain Score      Pain Loc      Pain Education      Exclude from Growth  Chart    No data found.  Updated Vital Signs BP (!) 127/101   Pulse 86   Wt 155 lb (70.3 kg)   BMI 21.02 kg/m   Visual Acuity Right Eye Distance:   Left Eye Distance:   Bilateral Distance:    Right Eye Near:   Left Eye Near:    Bilateral Near:     Physical Exam Vitals and nursing note reviewed.  Constitutional:      Appearance: Normal appearance. He is not ill-appearing.  HENT:     Head: Normocephalic and atraumatic.  Musculoskeletal:        General: Swelling, tenderness and signs of injury present. No deformity.  Skin:    General: Skin is warm and dry.     Capillary Refill: Capillary refill takes less than 2 seconds.  Neurological:     General: No focal deficit present.     Mental Status: He is alert and oriented to person, place, and time.      UC Treatments / Results  Labs (all labs ordered are listed, but only abnormal results are displayed) Labs Reviewed - No data to display  EKG   Radiology No results found.  Procedures Procedures (including critical care time)  Medications Ordered in UC Medications - No data to display  Initial Impression / Assessment and Plan / UC Course  I have reviewed the triage vital signs and the nursing notes.  Pertinent labs & imaging results that were available during my care of the  patient were reviewed by me and considered in my medical decision making (see chart for details).   Patient is a nontoxic-appearing 83 year old male presenting for evaluation of pain and swelling to his left wrist and also pain in the left elbow after falling out of his hospital bed at home twice yesterday.  He is currently in a wheelchair as he has a bilateral AKA.  His left wrist is edematous but nontender to palpation.  Patient does have range of motion of his wrist and hand.  He also range of motion of his elbow.  There is no appreciable edema, erythema, or ecchymosis of the left elbow.  Given that he did suffered 2 falls from his hospital bed I will obtain radiographs of the left wrist and left elbow to rule out any bony abnormality.  Left elbow x-rays independently reviewed and evaluated by me.  Impression: There is significant degeneration of the radius, ulna, and distal aspect of the humerus.  No definitive fractures noted.  No anterior fat pad present on exam.  Radiology overread is pending.  Left wrist x-rays independently reviewed and evaluated by me.  Impression: No evidence of fracture or dislocation.  There is significant degeneration noted.  Radiology overread is pending.  I will discharge patient on the diagnosis of contusion of his left wrist and elbow.  He can apply ice to the wrist and elbow for 20 minutes at a time 2-3 times a day as needed for pain and inflammation.  He may also use over-the-counter Tylenol according to the package instructions as needed for pain.  If his symptoms do not improve, or they worsen, he can return for reevaluation or follow-up with his PCP.   Final Clinical Impressions(s) / UC Diagnoses   Final diagnoses:  Contusion of left elbow, initial encounter  Contusion of left wrist, initial encounter     Discharge Instructions      Your x-rays tonight did not demonstrate any evidence of broken bones.  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  You can apply ice to your wrist and elbow for 20 minutes at a time 2-3 times a day as needed for pain and swelling.  You may also take over-the-counter Tylenol according the package instructions as needed for pain.  If your symptoms do not improve, or they worsen, either return for reevaluation or seek care in the ER.     ED Prescriptions   None    PDMP not reviewed this encounter.   Becky Augusta, NP 01/04/23 1934

## 2023-01-04 NOTE — Discharge Instructions (Signed)
Your x-rays tonight did not demonstrate any evidence of broken bones.  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  You can apply ice to your wrist and elbow for 20 minutes at a time 2-3 times a day as needed for pain and swelling.  You may also take over-the-counter Tylenol according the package instructions as needed for pain.  If your symptoms do not improve, or they worsen, either return for reevaluation or seek care in the ER.

## 2023-01-09 DIAGNOSIS — N182 Chronic kidney disease, stage 2 (mild): Secondary | ICD-10-CM | POA: Diagnosis not present

## 2023-01-09 DIAGNOSIS — J45909 Unspecified asthma, uncomplicated: Secondary | ICD-10-CM | POA: Diagnosis not present

## 2023-01-09 DIAGNOSIS — I5032 Chronic diastolic (congestive) heart failure: Secondary | ICD-10-CM | POA: Diagnosis not present

## 2023-01-09 DIAGNOSIS — E1122 Type 2 diabetes mellitus with diabetic chronic kidney disease: Secondary | ICD-10-CM | POA: Diagnosis not present

## 2023-01-09 DIAGNOSIS — I130 Hypertensive heart and chronic kidney disease with heart failure and stage 1 through stage 4 chronic kidney disease, or unspecified chronic kidney disease: Secondary | ICD-10-CM | POA: Diagnosis not present

## 2023-01-09 DIAGNOSIS — I69398 Other sequelae of cerebral infarction: Secondary | ICD-10-CM | POA: Diagnosis not present

## 2023-01-09 DIAGNOSIS — D631 Anemia in chronic kidney disease: Secondary | ICD-10-CM | POA: Diagnosis not present

## 2023-01-09 DIAGNOSIS — N179 Acute kidney failure, unspecified: Secondary | ICD-10-CM | POA: Diagnosis not present

## 2023-01-09 DIAGNOSIS — M6281 Muscle weakness (generalized): Secondary | ICD-10-CM | POA: Diagnosis not present

## 2023-01-10 ENCOUNTER — Encounter: Payer: Self-pay | Admitting: Cardiology

## 2023-01-10 ENCOUNTER — Ambulatory Visit: Payer: Medicare HMO | Admitting: Cardiology

## 2023-01-10 VITALS — BP 110/70 | HR 81 | Wt 155.0 lb

## 2023-01-10 DIAGNOSIS — I5032 Chronic diastolic (congestive) heart failure: Secondary | ICD-10-CM | POA: Diagnosis not present

## 2023-01-10 DIAGNOSIS — N179 Acute kidney failure, unspecified: Secondary | ICD-10-CM | POA: Diagnosis not present

## 2023-01-10 DIAGNOSIS — M6281 Muscle weakness (generalized): Secondary | ICD-10-CM | POA: Diagnosis not present

## 2023-01-10 DIAGNOSIS — D631 Anemia in chronic kidney disease: Secondary | ICD-10-CM | POA: Diagnosis not present

## 2023-01-10 DIAGNOSIS — J45909 Unspecified asthma, uncomplicated: Secondary | ICD-10-CM | POA: Diagnosis not present

## 2023-01-10 DIAGNOSIS — I69398 Other sequelae of cerebral infarction: Secondary | ICD-10-CM | POA: Diagnosis not present

## 2023-01-10 DIAGNOSIS — N182 Chronic kidney disease, stage 2 (mild): Secondary | ICD-10-CM | POA: Diagnosis not present

## 2023-01-10 DIAGNOSIS — E1122 Type 2 diabetes mellitus with diabetic chronic kidney disease: Secondary | ICD-10-CM | POA: Diagnosis not present

## 2023-01-10 DIAGNOSIS — I130 Hypertensive heart and chronic kidney disease with heart failure and stage 1 through stage 4 chronic kidney disease, or unspecified chronic kidney disease: Secondary | ICD-10-CM | POA: Diagnosis not present

## 2023-01-12 NOTE — Progress Notes (Signed)
This encounter was created in error - please disregard.

## 2023-01-15 DIAGNOSIS — E1122 Type 2 diabetes mellitus with diabetic chronic kidney disease: Secondary | ICD-10-CM | POA: Diagnosis not present

## 2023-01-15 DIAGNOSIS — S59902D Unspecified injury of left elbow, subsequent encounter: Secondary | ICD-10-CM | POA: Diagnosis not present

## 2023-01-15 DIAGNOSIS — M6281 Muscle weakness (generalized): Secondary | ICD-10-CM | POA: Diagnosis not present

## 2023-01-15 DIAGNOSIS — I69398 Other sequelae of cerebral infarction: Secondary | ICD-10-CM | POA: Diagnosis not present

## 2023-01-15 DIAGNOSIS — S6992XD Unspecified injury of left wrist, hand and finger(s), subsequent encounter: Secondary | ICD-10-CM | POA: Diagnosis not present

## 2023-01-15 DIAGNOSIS — I130 Hypertensive heart and chronic kidney disease with heart failure and stage 1 through stage 4 chronic kidney disease, or unspecified chronic kidney disease: Secondary | ICD-10-CM | POA: Diagnosis not present

## 2023-01-15 DIAGNOSIS — D631 Anemia in chronic kidney disease: Secondary | ICD-10-CM | POA: Diagnosis not present

## 2023-01-15 DIAGNOSIS — I5032 Chronic diastolic (congestive) heart failure: Secondary | ICD-10-CM | POA: Diagnosis not present

## 2023-01-15 DIAGNOSIS — N182 Chronic kidney disease, stage 2 (mild): Secondary | ICD-10-CM | POA: Diagnosis not present

## 2023-01-17 DIAGNOSIS — S6992XD Unspecified injury of left wrist, hand and finger(s), subsequent encounter: Secondary | ICD-10-CM | POA: Diagnosis not present

## 2023-01-17 DIAGNOSIS — I130 Hypertensive heart and chronic kidney disease with heart failure and stage 1 through stage 4 chronic kidney disease, or unspecified chronic kidney disease: Secondary | ICD-10-CM | POA: Diagnosis not present

## 2023-01-17 DIAGNOSIS — D631 Anemia in chronic kidney disease: Secondary | ICD-10-CM | POA: Diagnosis not present

## 2023-01-17 DIAGNOSIS — I5032 Chronic diastolic (congestive) heart failure: Secondary | ICD-10-CM | POA: Diagnosis not present

## 2023-01-17 DIAGNOSIS — M6281 Muscle weakness (generalized): Secondary | ICD-10-CM | POA: Diagnosis not present

## 2023-01-17 DIAGNOSIS — S59902D Unspecified injury of left elbow, subsequent encounter: Secondary | ICD-10-CM | POA: Diagnosis not present

## 2023-01-17 DIAGNOSIS — N182 Chronic kidney disease, stage 2 (mild): Secondary | ICD-10-CM | POA: Diagnosis not present

## 2023-01-17 DIAGNOSIS — E1122 Type 2 diabetes mellitus with diabetic chronic kidney disease: Secondary | ICD-10-CM | POA: Diagnosis not present

## 2023-01-17 DIAGNOSIS — I69398 Other sequelae of cerebral infarction: Secondary | ICD-10-CM | POA: Diagnosis not present

## 2023-01-18 DIAGNOSIS — I69398 Other sequelae of cerebral infarction: Secondary | ICD-10-CM | POA: Diagnosis not present

## 2023-01-18 DIAGNOSIS — E1122 Type 2 diabetes mellitus with diabetic chronic kidney disease: Secondary | ICD-10-CM | POA: Diagnosis not present

## 2023-01-18 DIAGNOSIS — M6281 Muscle weakness (generalized): Secondary | ICD-10-CM | POA: Diagnosis not present

## 2023-01-18 DIAGNOSIS — S6992XD Unspecified injury of left wrist, hand and finger(s), subsequent encounter: Secondary | ICD-10-CM | POA: Diagnosis not present

## 2023-01-18 DIAGNOSIS — D631 Anemia in chronic kidney disease: Secondary | ICD-10-CM | POA: Diagnosis not present

## 2023-01-18 DIAGNOSIS — I5032 Chronic diastolic (congestive) heart failure: Secondary | ICD-10-CM | POA: Diagnosis not present

## 2023-01-18 DIAGNOSIS — S59902D Unspecified injury of left elbow, subsequent encounter: Secondary | ICD-10-CM | POA: Diagnosis not present

## 2023-01-18 DIAGNOSIS — N182 Chronic kidney disease, stage 2 (mild): Secondary | ICD-10-CM | POA: Diagnosis not present

## 2023-01-18 DIAGNOSIS — I130 Hypertensive heart and chronic kidney disease with heart failure and stage 1 through stage 4 chronic kidney disease, or unspecified chronic kidney disease: Secondary | ICD-10-CM | POA: Diagnosis not present

## 2023-01-22 DIAGNOSIS — M6281 Muscle weakness (generalized): Secondary | ICD-10-CM | POA: Diagnosis not present

## 2023-01-22 DIAGNOSIS — E1122 Type 2 diabetes mellitus with diabetic chronic kidney disease: Secondary | ICD-10-CM | POA: Diagnosis not present

## 2023-01-22 DIAGNOSIS — S6992XD Unspecified injury of left wrist, hand and finger(s), subsequent encounter: Secondary | ICD-10-CM | POA: Diagnosis not present

## 2023-01-22 DIAGNOSIS — I130 Hypertensive heart and chronic kidney disease with heart failure and stage 1 through stage 4 chronic kidney disease, or unspecified chronic kidney disease: Secondary | ICD-10-CM | POA: Diagnosis not present

## 2023-01-22 DIAGNOSIS — I5032 Chronic diastolic (congestive) heart failure: Secondary | ICD-10-CM | POA: Diagnosis not present

## 2023-01-22 DIAGNOSIS — S59902D Unspecified injury of left elbow, subsequent encounter: Secondary | ICD-10-CM | POA: Diagnosis not present

## 2023-01-22 DIAGNOSIS — D631 Anemia in chronic kidney disease: Secondary | ICD-10-CM | POA: Diagnosis not present

## 2023-01-22 DIAGNOSIS — I69398 Other sequelae of cerebral infarction: Secondary | ICD-10-CM | POA: Diagnosis not present

## 2023-01-22 DIAGNOSIS — N182 Chronic kidney disease, stage 2 (mild): Secondary | ICD-10-CM | POA: Diagnosis not present

## 2023-01-23 NOTE — Progress Notes (Signed)
Order(s) created erroneously. Erroneous order ID: [id number]  Order moved by: CHART CORRECTION ANALYST, FIFTEEN  Order move date/time: 01/23/2023 12:38 PM  Source Patient: [id number]  Source Contact: 01/10/2023  Destination Patient: [id number]  Destination Contact: 10/04/2022

## 2023-01-24 DIAGNOSIS — S59902D Unspecified injury of left elbow, subsequent encounter: Secondary | ICD-10-CM | POA: Diagnosis not present

## 2023-01-24 DIAGNOSIS — I130 Hypertensive heart and chronic kidney disease with heart failure and stage 1 through stage 4 chronic kidney disease, or unspecified chronic kidney disease: Secondary | ICD-10-CM | POA: Diagnosis not present

## 2023-01-24 DIAGNOSIS — I69398 Other sequelae of cerebral infarction: Secondary | ICD-10-CM | POA: Diagnosis not present

## 2023-01-24 DIAGNOSIS — I5032 Chronic diastolic (congestive) heart failure: Secondary | ICD-10-CM | POA: Diagnosis not present

## 2023-01-24 DIAGNOSIS — N182 Chronic kidney disease, stage 2 (mild): Secondary | ICD-10-CM | POA: Diagnosis not present

## 2023-01-24 DIAGNOSIS — S6992XD Unspecified injury of left wrist, hand and finger(s), subsequent encounter: Secondary | ICD-10-CM | POA: Diagnosis not present

## 2023-01-24 DIAGNOSIS — M6281 Muscle weakness (generalized): Secondary | ICD-10-CM | POA: Diagnosis not present

## 2023-01-24 DIAGNOSIS — E1122 Type 2 diabetes mellitus with diabetic chronic kidney disease: Secondary | ICD-10-CM | POA: Diagnosis not present

## 2023-01-24 DIAGNOSIS — D631 Anemia in chronic kidney disease: Secondary | ICD-10-CM | POA: Diagnosis not present

## 2023-01-25 DIAGNOSIS — M6281 Muscle weakness (generalized): Secondary | ICD-10-CM | POA: Diagnosis not present

## 2023-01-25 DIAGNOSIS — I69398 Other sequelae of cerebral infarction: Secondary | ICD-10-CM | POA: Diagnosis not present

## 2023-01-25 DIAGNOSIS — D631 Anemia in chronic kidney disease: Secondary | ICD-10-CM | POA: Diagnosis not present

## 2023-01-25 DIAGNOSIS — E1122 Type 2 diabetes mellitus with diabetic chronic kidney disease: Secondary | ICD-10-CM | POA: Diagnosis not present

## 2023-01-25 DIAGNOSIS — I130 Hypertensive heart and chronic kidney disease with heart failure and stage 1 through stage 4 chronic kidney disease, or unspecified chronic kidney disease: Secondary | ICD-10-CM | POA: Diagnosis not present

## 2023-01-25 DIAGNOSIS — N182 Chronic kidney disease, stage 2 (mild): Secondary | ICD-10-CM | POA: Diagnosis not present

## 2023-01-25 DIAGNOSIS — I5032 Chronic diastolic (congestive) heart failure: Secondary | ICD-10-CM | POA: Diagnosis not present

## 2023-01-25 DIAGNOSIS — S59902D Unspecified injury of left elbow, subsequent encounter: Secondary | ICD-10-CM | POA: Diagnosis not present

## 2023-01-25 DIAGNOSIS — S6992XD Unspecified injury of left wrist, hand and finger(s), subsequent encounter: Secondary | ICD-10-CM | POA: Diagnosis not present

## 2023-01-29 DIAGNOSIS — I5032 Chronic diastolic (congestive) heart failure: Secondary | ICD-10-CM | POA: Diagnosis not present

## 2023-01-29 DIAGNOSIS — D631 Anemia in chronic kidney disease: Secondary | ICD-10-CM | POA: Diagnosis not present

## 2023-01-29 DIAGNOSIS — E1122 Type 2 diabetes mellitus with diabetic chronic kidney disease: Secondary | ICD-10-CM | POA: Diagnosis not present

## 2023-01-29 DIAGNOSIS — I69398 Other sequelae of cerebral infarction: Secondary | ICD-10-CM | POA: Diagnosis not present

## 2023-01-29 DIAGNOSIS — S59902D Unspecified injury of left elbow, subsequent encounter: Secondary | ICD-10-CM | POA: Diagnosis not present

## 2023-01-29 DIAGNOSIS — N182 Chronic kidney disease, stage 2 (mild): Secondary | ICD-10-CM | POA: Diagnosis not present

## 2023-01-29 DIAGNOSIS — S6992XD Unspecified injury of left wrist, hand and finger(s), subsequent encounter: Secondary | ICD-10-CM | POA: Diagnosis not present

## 2023-01-29 DIAGNOSIS — I130 Hypertensive heart and chronic kidney disease with heart failure and stage 1 through stage 4 chronic kidney disease, or unspecified chronic kidney disease: Secondary | ICD-10-CM | POA: Diagnosis not present

## 2023-01-29 DIAGNOSIS — M6281 Muscle weakness (generalized): Secondary | ICD-10-CM | POA: Diagnosis not present

## 2023-01-31 DIAGNOSIS — I5032 Chronic diastolic (congestive) heart failure: Secondary | ICD-10-CM | POA: Diagnosis not present

## 2023-01-31 DIAGNOSIS — N182 Chronic kidney disease, stage 2 (mild): Secondary | ICD-10-CM | POA: Diagnosis not present

## 2023-01-31 DIAGNOSIS — D631 Anemia in chronic kidney disease: Secondary | ICD-10-CM | POA: Diagnosis not present

## 2023-01-31 DIAGNOSIS — E1122 Type 2 diabetes mellitus with diabetic chronic kidney disease: Secondary | ICD-10-CM | POA: Diagnosis not present

## 2023-01-31 DIAGNOSIS — I130 Hypertensive heart and chronic kidney disease with heart failure and stage 1 through stage 4 chronic kidney disease, or unspecified chronic kidney disease: Secondary | ICD-10-CM | POA: Diagnosis not present

## 2023-01-31 DIAGNOSIS — I69398 Other sequelae of cerebral infarction: Secondary | ICD-10-CM | POA: Diagnosis not present

## 2023-01-31 DIAGNOSIS — S59902D Unspecified injury of left elbow, subsequent encounter: Secondary | ICD-10-CM | POA: Diagnosis not present

## 2023-01-31 DIAGNOSIS — S6992XD Unspecified injury of left wrist, hand and finger(s), subsequent encounter: Secondary | ICD-10-CM | POA: Diagnosis not present

## 2023-01-31 DIAGNOSIS — M6281 Muscle weakness (generalized): Secondary | ICD-10-CM | POA: Diagnosis not present

## 2023-02-02 DIAGNOSIS — N182 Chronic kidney disease, stage 2 (mild): Secondary | ICD-10-CM | POA: Diagnosis not present

## 2023-02-02 DIAGNOSIS — S59902D Unspecified injury of left elbow, subsequent encounter: Secondary | ICD-10-CM | POA: Diagnosis not present

## 2023-02-02 DIAGNOSIS — D631 Anemia in chronic kidney disease: Secondary | ICD-10-CM | POA: Diagnosis not present

## 2023-02-02 DIAGNOSIS — S6992XD Unspecified injury of left wrist, hand and finger(s), subsequent encounter: Secondary | ICD-10-CM | POA: Diagnosis not present

## 2023-02-02 DIAGNOSIS — E1122 Type 2 diabetes mellitus with diabetic chronic kidney disease: Secondary | ICD-10-CM | POA: Diagnosis not present

## 2023-02-02 DIAGNOSIS — I130 Hypertensive heart and chronic kidney disease with heart failure and stage 1 through stage 4 chronic kidney disease, or unspecified chronic kidney disease: Secondary | ICD-10-CM | POA: Diagnosis not present

## 2023-02-02 DIAGNOSIS — I5032 Chronic diastolic (congestive) heart failure: Secondary | ICD-10-CM | POA: Diagnosis not present

## 2023-02-02 DIAGNOSIS — I69398 Other sequelae of cerebral infarction: Secondary | ICD-10-CM | POA: Diagnosis not present

## 2023-02-02 DIAGNOSIS — M6281 Muscle weakness (generalized): Secondary | ICD-10-CM | POA: Diagnosis not present

## 2023-02-05 DIAGNOSIS — N182 Chronic kidney disease, stage 2 (mild): Secondary | ICD-10-CM | POA: Diagnosis not present

## 2023-02-05 DIAGNOSIS — M6281 Muscle weakness (generalized): Secondary | ICD-10-CM | POA: Diagnosis not present

## 2023-02-05 DIAGNOSIS — I130 Hypertensive heart and chronic kidney disease with heart failure and stage 1 through stage 4 chronic kidney disease, or unspecified chronic kidney disease: Secondary | ICD-10-CM | POA: Diagnosis not present

## 2023-02-05 DIAGNOSIS — E1122 Type 2 diabetes mellitus with diabetic chronic kidney disease: Secondary | ICD-10-CM | POA: Diagnosis not present

## 2023-02-05 DIAGNOSIS — D631 Anemia in chronic kidney disease: Secondary | ICD-10-CM | POA: Diagnosis not present

## 2023-02-05 DIAGNOSIS — I69398 Other sequelae of cerebral infarction: Secondary | ICD-10-CM | POA: Diagnosis not present

## 2023-02-05 DIAGNOSIS — S6992XD Unspecified injury of left wrist, hand and finger(s), subsequent encounter: Secondary | ICD-10-CM | POA: Diagnosis not present

## 2023-02-05 DIAGNOSIS — S59902D Unspecified injury of left elbow, subsequent encounter: Secondary | ICD-10-CM | POA: Diagnosis not present

## 2023-02-05 DIAGNOSIS — I5032 Chronic diastolic (congestive) heart failure: Secondary | ICD-10-CM | POA: Diagnosis not present

## 2023-02-07 DIAGNOSIS — E1122 Type 2 diabetes mellitus with diabetic chronic kidney disease: Secondary | ICD-10-CM | POA: Diagnosis not present

## 2023-02-07 DIAGNOSIS — I130 Hypertensive heart and chronic kidney disease with heart failure and stage 1 through stage 4 chronic kidney disease, or unspecified chronic kidney disease: Secondary | ICD-10-CM | POA: Diagnosis not present

## 2023-02-07 DIAGNOSIS — D631 Anemia in chronic kidney disease: Secondary | ICD-10-CM | POA: Diagnosis not present

## 2023-02-07 DIAGNOSIS — S59902D Unspecified injury of left elbow, subsequent encounter: Secondary | ICD-10-CM | POA: Diagnosis not present

## 2023-02-07 DIAGNOSIS — I5032 Chronic diastolic (congestive) heart failure: Secondary | ICD-10-CM | POA: Diagnosis not present

## 2023-02-07 DIAGNOSIS — I69398 Other sequelae of cerebral infarction: Secondary | ICD-10-CM | POA: Diagnosis not present

## 2023-02-07 DIAGNOSIS — N182 Chronic kidney disease, stage 2 (mild): Secondary | ICD-10-CM | POA: Diagnosis not present

## 2023-02-07 DIAGNOSIS — S6992XD Unspecified injury of left wrist, hand and finger(s), subsequent encounter: Secondary | ICD-10-CM | POA: Diagnosis not present

## 2023-02-07 DIAGNOSIS — M6281 Muscle weakness (generalized): Secondary | ICD-10-CM | POA: Diagnosis not present

## 2023-02-12 DIAGNOSIS — S6992XD Unspecified injury of left wrist, hand and finger(s), subsequent encounter: Secondary | ICD-10-CM | POA: Diagnosis not present

## 2023-02-12 DIAGNOSIS — M6281 Muscle weakness (generalized): Secondary | ICD-10-CM | POA: Diagnosis not present

## 2023-02-12 DIAGNOSIS — I69398 Other sequelae of cerebral infarction: Secondary | ICD-10-CM | POA: Diagnosis not present

## 2023-02-12 DIAGNOSIS — S59902D Unspecified injury of left elbow, subsequent encounter: Secondary | ICD-10-CM | POA: Diagnosis not present

## 2023-02-12 DIAGNOSIS — D631 Anemia in chronic kidney disease: Secondary | ICD-10-CM | POA: Diagnosis not present

## 2023-02-12 DIAGNOSIS — I5032 Chronic diastolic (congestive) heart failure: Secondary | ICD-10-CM | POA: Diagnosis not present

## 2023-02-12 DIAGNOSIS — N182 Chronic kidney disease, stage 2 (mild): Secondary | ICD-10-CM | POA: Diagnosis not present

## 2023-02-12 DIAGNOSIS — I130 Hypertensive heart and chronic kidney disease with heart failure and stage 1 through stage 4 chronic kidney disease, or unspecified chronic kidney disease: Secondary | ICD-10-CM | POA: Diagnosis not present

## 2023-02-12 DIAGNOSIS — E1122 Type 2 diabetes mellitus with diabetic chronic kidney disease: Secondary | ICD-10-CM | POA: Diagnosis not present

## 2023-02-14 DIAGNOSIS — E538 Deficiency of other specified B group vitamins: Secondary | ICD-10-CM | POA: Diagnosis not present

## 2023-02-14 DIAGNOSIS — Z7901 Long term (current) use of anticoagulants: Secondary | ICD-10-CM | POA: Diagnosis not present

## 2023-02-14 DIAGNOSIS — I428 Other cardiomyopathies: Secondary | ICD-10-CM | POA: Diagnosis not present

## 2023-02-14 DIAGNOSIS — I480 Paroxysmal atrial fibrillation: Secondary | ICD-10-CM | POA: Diagnosis not present

## 2023-02-14 DIAGNOSIS — I10 Essential (primary) hypertension: Secondary | ICD-10-CM | POA: Diagnosis not present

## 2023-02-14 DIAGNOSIS — E559 Vitamin D deficiency, unspecified: Secondary | ICD-10-CM | POA: Diagnosis not present

## 2023-02-14 DIAGNOSIS — E7800 Pure hypercholesterolemia, unspecified: Secondary | ICD-10-CM | POA: Diagnosis not present

## 2023-02-14 DIAGNOSIS — I5032 Chronic diastolic (congestive) heart failure: Secondary | ICD-10-CM | POA: Diagnosis not present

## 2023-02-14 DIAGNOSIS — E785 Hyperlipidemia, unspecified: Secondary | ICD-10-CM | POA: Diagnosis not present

## 2023-02-16 DIAGNOSIS — S59902D Unspecified injury of left elbow, subsequent encounter: Secondary | ICD-10-CM | POA: Diagnosis not present

## 2023-02-16 DIAGNOSIS — E1122 Type 2 diabetes mellitus with diabetic chronic kidney disease: Secondary | ICD-10-CM | POA: Diagnosis not present

## 2023-02-16 DIAGNOSIS — I130 Hypertensive heart and chronic kidney disease with heart failure and stage 1 through stage 4 chronic kidney disease, or unspecified chronic kidney disease: Secondary | ICD-10-CM | POA: Diagnosis not present

## 2023-02-16 DIAGNOSIS — I5032 Chronic diastolic (congestive) heart failure: Secondary | ICD-10-CM | POA: Diagnosis not present

## 2023-02-16 DIAGNOSIS — N182 Chronic kidney disease, stage 2 (mild): Secondary | ICD-10-CM | POA: Diagnosis not present

## 2023-02-16 DIAGNOSIS — S6992XD Unspecified injury of left wrist, hand and finger(s), subsequent encounter: Secondary | ICD-10-CM | POA: Diagnosis not present

## 2023-02-16 DIAGNOSIS — D631 Anemia in chronic kidney disease: Secondary | ICD-10-CM | POA: Diagnosis not present

## 2023-02-16 DIAGNOSIS — M6281 Muscle weakness (generalized): Secondary | ICD-10-CM | POA: Diagnosis not present

## 2023-02-16 DIAGNOSIS — I69398 Other sequelae of cerebral infarction: Secondary | ICD-10-CM | POA: Diagnosis not present

## 2023-02-20 DIAGNOSIS — Z0000 Encounter for general adult medical examination without abnormal findings: Secondary | ICD-10-CM | POA: Diagnosis not present

## 2023-02-20 DIAGNOSIS — R630 Anorexia: Secondary | ICD-10-CM | POA: Diagnosis not present

## 2023-02-20 DIAGNOSIS — I4891 Unspecified atrial fibrillation: Secondary | ICD-10-CM | POA: Diagnosis not present

## 2023-02-20 DIAGNOSIS — I130 Hypertensive heart and chronic kidney disease with heart failure and stage 1 through stage 4 chronic kidney disease, or unspecified chronic kidney disease: Secondary | ICD-10-CM | POA: Diagnosis not present

## 2023-02-20 DIAGNOSIS — J45909 Unspecified asthma, uncomplicated: Secondary | ICD-10-CM | POA: Diagnosis not present

## 2023-02-20 DIAGNOSIS — Z1331 Encounter for screening for depression: Secondary | ICD-10-CM | POA: Diagnosis not present

## 2023-02-20 DIAGNOSIS — D62 Acute posthemorrhagic anemia: Secondary | ICD-10-CM | POA: Diagnosis not present

## 2023-02-20 DIAGNOSIS — Z89611 Acquired absence of right leg above knee: Secondary | ICD-10-CM | POA: Diagnosis not present

## 2023-02-20 DIAGNOSIS — E1142 Type 2 diabetes mellitus with diabetic polyneuropathy: Secondary | ICD-10-CM | POA: Diagnosis not present

## 2023-02-20 DIAGNOSIS — E8809 Other disorders of plasma-protein metabolism, not elsewhere classified: Secondary | ICD-10-CM | POA: Diagnosis not present

## 2023-02-20 DIAGNOSIS — D631 Anemia in chronic kidney disease: Secondary | ICD-10-CM | POA: Diagnosis not present

## 2023-02-20 DIAGNOSIS — Z89612 Acquired absence of left leg above knee: Secondary | ICD-10-CM | POA: Diagnosis not present

## 2023-02-20 DIAGNOSIS — M6281 Muscle weakness (generalized): Secondary | ICD-10-CM | POA: Diagnosis not present

## 2023-02-20 DIAGNOSIS — I69398 Other sequelae of cerebral infarction: Secondary | ICD-10-CM | POA: Diagnosis not present

## 2023-02-20 DIAGNOSIS — R634 Abnormal weight loss: Secondary | ICD-10-CM | POA: Diagnosis not present

## 2023-02-20 DIAGNOSIS — I5032 Chronic diastolic (congestive) heart failure: Secondary | ICD-10-CM | POA: Diagnosis not present

## 2023-02-20 DIAGNOSIS — E538 Deficiency of other specified B group vitamins: Secondary | ICD-10-CM | POA: Diagnosis not present

## 2023-02-20 DIAGNOSIS — N182 Chronic kidney disease, stage 2 (mild): Secondary | ICD-10-CM | POA: Diagnosis not present

## 2023-02-20 DIAGNOSIS — S6992XD Unspecified injury of left wrist, hand and finger(s), subsequent encounter: Secondary | ICD-10-CM | POA: Diagnosis not present

## 2023-02-20 DIAGNOSIS — I10 Essential (primary) hypertension: Secondary | ICD-10-CM | POA: Diagnosis not present

## 2023-02-20 DIAGNOSIS — E559 Vitamin D deficiency, unspecified: Secondary | ICD-10-CM | POA: Diagnosis not present

## 2023-02-20 DIAGNOSIS — I110 Hypertensive heart disease with heart failure: Secondary | ICD-10-CM | POA: Diagnosis not present

## 2023-02-20 DIAGNOSIS — E785 Hyperlipidemia, unspecified: Secondary | ICD-10-CM | POA: Diagnosis not present

## 2023-02-20 DIAGNOSIS — Z1389 Encounter for screening for other disorder: Secondary | ICD-10-CM | POA: Diagnosis not present

## 2023-02-20 DIAGNOSIS — E1122 Type 2 diabetes mellitus with diabetic chronic kidney disease: Secondary | ICD-10-CM | POA: Diagnosis not present

## 2023-02-20 DIAGNOSIS — E1151 Type 2 diabetes mellitus with diabetic peripheral angiopathy without gangrene: Secondary | ICD-10-CM | POA: Diagnosis not present

## 2023-02-20 DIAGNOSIS — S59902D Unspecified injury of left elbow, subsequent encounter: Secondary | ICD-10-CM | POA: Diagnosis not present

## 2023-02-20 DIAGNOSIS — Z23 Encounter for immunization: Secondary | ICD-10-CM | POA: Diagnosis not present

## 2023-02-20 DIAGNOSIS — E46 Unspecified protein-calorie malnutrition: Secondary | ICD-10-CM | POA: Diagnosis not present

## 2023-02-20 DIAGNOSIS — I429 Cardiomyopathy, unspecified: Secondary | ICD-10-CM | POA: Diagnosis not present

## 2023-02-20 DIAGNOSIS — D638 Anemia in other chronic diseases classified elsewhere: Secondary | ICD-10-CM | POA: Diagnosis not present

## 2023-02-26 DIAGNOSIS — D631 Anemia in chronic kidney disease: Secondary | ICD-10-CM | POA: Diagnosis not present

## 2023-02-26 DIAGNOSIS — I130 Hypertensive heart and chronic kidney disease with heart failure and stage 1 through stage 4 chronic kidney disease, or unspecified chronic kidney disease: Secondary | ICD-10-CM | POA: Diagnosis not present

## 2023-02-26 DIAGNOSIS — S6992XD Unspecified injury of left wrist, hand and finger(s), subsequent encounter: Secondary | ICD-10-CM | POA: Diagnosis not present

## 2023-02-26 DIAGNOSIS — I69398 Other sequelae of cerebral infarction: Secondary | ICD-10-CM | POA: Diagnosis not present

## 2023-02-26 DIAGNOSIS — M6281 Muscle weakness (generalized): Secondary | ICD-10-CM | POA: Diagnosis not present

## 2023-02-26 DIAGNOSIS — S59902D Unspecified injury of left elbow, subsequent encounter: Secondary | ICD-10-CM | POA: Diagnosis not present

## 2023-02-26 DIAGNOSIS — N182 Chronic kidney disease, stage 2 (mild): Secondary | ICD-10-CM | POA: Diagnosis not present

## 2023-02-26 DIAGNOSIS — E1122 Type 2 diabetes mellitus with diabetic chronic kidney disease: Secondary | ICD-10-CM | POA: Diagnosis not present

## 2023-02-26 DIAGNOSIS — I5032 Chronic diastolic (congestive) heart failure: Secondary | ICD-10-CM | POA: Diagnosis not present

## 2023-02-27 DIAGNOSIS — E1169 Type 2 diabetes mellitus with other specified complication: Secondary | ICD-10-CM | POA: Diagnosis not present

## 2023-02-27 DIAGNOSIS — Z8673 Personal history of transient ischemic attack (TIA), and cerebral infarction without residual deficits: Secondary | ICD-10-CM | POA: Diagnosis not present

## 2023-02-27 DIAGNOSIS — R29898 Other symptoms and signs involving the musculoskeletal system: Secondary | ICD-10-CM | POA: Diagnosis not present

## 2023-02-27 DIAGNOSIS — M6281 Muscle weakness (generalized): Secondary | ICD-10-CM | POA: Diagnosis not present

## 2023-02-27 DIAGNOSIS — N179 Acute kidney failure, unspecified: Secondary | ICD-10-CM | POA: Diagnosis not present

## 2023-02-27 DIAGNOSIS — I110 Hypertensive heart disease with heart failure: Secondary | ICD-10-CM | POA: Diagnosis not present

## 2023-02-27 DIAGNOSIS — Z89612 Acquired absence of left leg above knee: Secondary | ICD-10-CM | POA: Diagnosis not present

## 2023-02-27 DIAGNOSIS — Z89611 Acquired absence of right leg above knee: Secondary | ICD-10-CM | POA: Diagnosis not present

## 2023-03-01 ENCOUNTER — Inpatient Hospital Stay: Payer: Medicare HMO

## 2023-03-01 ENCOUNTER — Inpatient Hospital Stay
Admission: EM | Admit: 2023-03-01 | Discharge: 2023-03-06 | DRG: 871 | Disposition: A | Discharge status: Home Health | Payer: Medicare HMO | Attending: Student | Admitting: Student

## 2023-03-01 ENCOUNTER — Emergency Department: Payer: Medicare HMO

## 2023-03-01 ENCOUNTER — Other Ambulatory Visit: Payer: Self-pay

## 2023-03-01 DIAGNOSIS — N390 Urinary tract infection, site not specified: Secondary | ICD-10-CM | POA: Diagnosis present

## 2023-03-01 DIAGNOSIS — Z720 Tobacco use: Secondary | ICD-10-CM

## 2023-03-01 DIAGNOSIS — I629 Nontraumatic intracranial hemorrhage, unspecified: Secondary | ICD-10-CM | POA: Diagnosis present

## 2023-03-01 DIAGNOSIS — L8990 Pressure ulcer of unspecified site, unspecified stage: Secondary | ICD-10-CM | POA: Insufficient documentation

## 2023-03-01 DIAGNOSIS — I110 Hypertensive heart disease with heart failure: Secondary | ICD-10-CM | POA: Diagnosis present

## 2023-03-01 DIAGNOSIS — I5032 Chronic diastolic (congestive) heart failure: Secondary | ICD-10-CM | POA: Diagnosis present

## 2023-03-01 DIAGNOSIS — I63511 Cerebral infarction due to unspecified occlusion or stenosis of right middle cerebral artery: Secondary | ICD-10-CM | POA: Diagnosis not present

## 2023-03-01 DIAGNOSIS — R6521 Severe sepsis with septic shock: Secondary | ICD-10-CM | POA: Diagnosis not present

## 2023-03-01 DIAGNOSIS — D6959 Other secondary thrombocytopenia: Secondary | ICD-10-CM | POA: Diagnosis present

## 2023-03-01 DIAGNOSIS — R680 Hypothermia, not associated with low environmental temperature: Secondary | ICD-10-CM | POA: Diagnosis present

## 2023-03-01 DIAGNOSIS — R579 Shock, unspecified: Principal | ICD-10-CM | POA: Diagnosis present

## 2023-03-01 DIAGNOSIS — A419 Sepsis, unspecified organism: Principal | ICD-10-CM | POA: Diagnosis present

## 2023-03-01 DIAGNOSIS — N179 Acute kidney failure, unspecified: Secondary | ICD-10-CM | POA: Diagnosis present

## 2023-03-01 DIAGNOSIS — G9389 Other specified disorders of brain: Secondary | ICD-10-CM | POA: Diagnosis not present

## 2023-03-01 DIAGNOSIS — I480 Paroxysmal atrial fibrillation: Secondary | ICD-10-CM | POA: Diagnosis present

## 2023-03-01 DIAGNOSIS — Z66 Do not resuscitate: Secondary | ICD-10-CM | POA: Diagnosis present

## 2023-03-01 DIAGNOSIS — Z8673 Personal history of transient ischemic attack (TIA), and cerebral infarction without residual deficits: Secondary | ICD-10-CM

## 2023-03-01 DIAGNOSIS — L89152 Pressure ulcer of sacral region, stage 2: Secondary | ICD-10-CM | POA: Diagnosis present

## 2023-03-01 DIAGNOSIS — E861 Hypovolemia: Secondary | ICD-10-CM | POA: Diagnosis present

## 2023-03-01 DIAGNOSIS — D638 Anemia in other chronic diseases classified elsewhere: Secondary | ICD-10-CM | POA: Diagnosis present

## 2023-03-01 DIAGNOSIS — Z7982 Long term (current) use of aspirin: Secondary | ICD-10-CM

## 2023-03-01 DIAGNOSIS — Z89611 Acquired absence of right leg above knee: Secondary | ICD-10-CM

## 2023-03-01 DIAGNOSIS — R001 Bradycardia, unspecified: Secondary | ICD-10-CM | POA: Diagnosis present

## 2023-03-01 DIAGNOSIS — R531 Weakness: Secondary | ICD-10-CM | POA: Diagnosis not present

## 2023-03-01 DIAGNOSIS — R571 Hypovolemic shock: Secondary | ICD-10-CM | POA: Diagnosis not present

## 2023-03-01 DIAGNOSIS — I959 Hypotension, unspecified: Principal | ICD-10-CM

## 2023-03-01 DIAGNOSIS — I4891 Unspecified atrial fibrillation: Secondary | ICD-10-CM | POA: Diagnosis not present

## 2023-03-01 DIAGNOSIS — E1151 Type 2 diabetes mellitus with diabetic peripheral angiopathy without gangrene: Secondary | ICD-10-CM | POA: Diagnosis present

## 2023-03-01 DIAGNOSIS — Z79899 Other long term (current) drug therapy: Secondary | ICD-10-CM

## 2023-03-01 DIAGNOSIS — K861 Other chronic pancreatitis: Secondary | ICD-10-CM | POA: Diagnosis present

## 2023-03-01 DIAGNOSIS — Z89612 Acquired absence of left leg above knee: Secondary | ICD-10-CM

## 2023-03-01 DIAGNOSIS — Z7401 Bed confinement status: Secondary | ICD-10-CM

## 2023-03-01 DIAGNOSIS — E43 Unspecified severe protein-calorie malnutrition: Secondary | ICD-10-CM | POA: Diagnosis present

## 2023-03-01 DIAGNOSIS — G9341 Metabolic encephalopathy: Secondary | ICD-10-CM | POA: Diagnosis present

## 2023-03-01 DIAGNOSIS — Z681 Body mass index (BMI) 19 or less, adult: Secondary | ICD-10-CM | POA: Diagnosis not present

## 2023-03-01 DIAGNOSIS — Z0389 Encounter for observation for other suspected diseases and conditions ruled out: Secondary | ICD-10-CM | POA: Diagnosis not present

## 2023-03-01 DIAGNOSIS — I771 Stricture of artery: Secondary | ICD-10-CM | POA: Diagnosis not present

## 2023-03-01 DIAGNOSIS — R4182 Altered mental status, unspecified: Secondary | ICD-10-CM | POA: Diagnosis not present

## 2023-03-01 DIAGNOSIS — R059 Cough, unspecified: Secondary | ICD-10-CM | POA: Diagnosis present

## 2023-03-01 DIAGNOSIS — I6782 Cerebral ischemia: Secondary | ICD-10-CM | POA: Diagnosis not present

## 2023-03-01 DIAGNOSIS — K219 Gastro-esophageal reflux disease without esophagitis: Secondary | ICD-10-CM | POA: Diagnosis present

## 2023-03-01 LAB — COMPREHENSIVE METABOLIC PANEL
ALT: 22 U/L (ref 0–44)
AST: 26 U/L (ref 15–41)
Albumin: 3 g/dL — ABNORMAL LOW (ref 3.5–5.0)
Alkaline Phosphatase: 71 U/L (ref 38–126)
Anion gap: 9 (ref 5–15)
BUN: 15 mg/dL (ref 8–23)
CO2: 24 mmol/L (ref 22–32)
Calcium: 9 mg/dL (ref 8.9–10.3)
Chloride: 102 mmol/L (ref 98–111)
Creatinine, Ser: 1.48 mg/dL — ABNORMAL HIGH (ref 0.61–1.24)
GFR, Estimated: 47 mL/min — ABNORMAL LOW (ref >60)
Glucose, Bld: 81 mg/dL (ref 70–99)
Potassium: 4.2 mmol/L (ref 3.5–5.1)
Sodium: 135 mmol/L (ref 135–145)
Total Bilirubin: 1.1 mg/dL (ref <1.2)
Total Protein: 7 g/dL (ref 6.5–8.1)

## 2023-03-01 LAB — GLUCOSE, CAPILLARY
Glucose-Capillary: 56 mg/dL — ABNORMAL LOW (ref 70–99)
Glucose-Capillary: 74 mg/dL (ref 70–99)
Glucose-Capillary: 81 mg/dL (ref 70–99)

## 2023-03-01 LAB — BLOOD GAS, ARTERIAL
Acid-base deficit: 2.9 mmol/L — ABNORMAL HIGH (ref 0.0–2.0)
Bicarbonate: 24.8 mmol/L (ref 20.0–28.0)
FIO2: 0.21 %
O2 Saturation: 96 %
Patient temperature: 33.8
pCO2 arterial: 47 mm[Hg] (ref 32–48)
pH, Arterial: 7.31 — ABNORMAL LOW (ref 7.35–7.45)
pO2, Arterial: 61 mm[Hg] — ABNORMAL LOW (ref 83–108)

## 2023-03-01 LAB — CORTISOL: Cortisol, Plasma: 4.4 ug/dL

## 2023-03-01 LAB — CBC WITH DIFFERENTIAL/PLATELET
Abs Immature Granulocytes: 0 10*3/uL (ref 0.00–0.07)
Basophils Absolute: 0 10*3/uL (ref 0.0–0.1)
Basophils Relative: 0 %
Eosinophils Absolute: 0.1 10*3/uL (ref 0.0–0.5)
Eosinophils Relative: 2 %
HCT: 34.1 % — ABNORMAL LOW (ref 39.0–52.0)
Hemoglobin: 11.8 g/dL — ABNORMAL LOW (ref 13.0–17.0)
Immature Granulocytes: 0 %
Lymphocytes Relative: 53 %
Lymphs Abs: 1.9 10*3/uL (ref 0.7–4.0)
MCH: 28.7 pg (ref 26.0–34.0)
MCHC: 34.6 g/dL (ref 30.0–36.0)
MCV: 83 fL (ref 80.0–100.0)
Monocytes Absolute: 0.3 10*3/uL (ref 0.1–1.0)
Monocytes Relative: 9 %
Neutro Abs: 1.3 10*3/uL — ABNORMAL LOW (ref 1.7–7.7)
Neutrophils Relative %: 36 %
Platelets: 110 10*3/uL — ABNORMAL LOW (ref 150–400)
RBC: 4.11 MIL/uL — ABNORMAL LOW (ref 4.22–5.81)
RDW: 14.9 % (ref 11.5–15.5)
WBC: 3.6 10*3/uL — ABNORMAL LOW (ref 4.0–10.5)
nRBC: 0 % (ref 0.0–0.2)

## 2023-03-01 LAB — APTT: aPTT: 37 s — ABNORMAL HIGH (ref 24–36)

## 2023-03-01 LAB — URINALYSIS, ROUTINE W REFLEX MICROSCOPIC
Bilirubin Urine: NEGATIVE
Glucose, UA: NEGATIVE mg/dL
Ketones, ur: NEGATIVE mg/dL
Nitrite: NEGATIVE
Protein, ur: NEGATIVE mg/dL
RBC / HPF: >50 RBC/hpf (ref 0–5)
Specific Gravity, Urine: 1.005 (ref 1.005–1.030)
WBC, UA: >50 WBC/hpf (ref 0–5)
pH: 5 (ref 5.0–8.0)

## 2023-03-01 LAB — RESP PANEL BY RT-PCR (RSV, FLU A&B, COVID)  RVPGX2
Influenza A by PCR: NEGATIVE
Influenza B by PCR: NEGATIVE
Resp Syncytial Virus by PCR: NEGATIVE
SARS Coronavirus 2 by RT PCR: NEGATIVE

## 2023-03-01 LAB — LACTIC ACID, PLASMA
Lactic Acid, Venous: 1.2 mmol/L (ref 0.5–1.9)
Lactic Acid, Venous: 1.8 mmol/L (ref 0.5–1.9)
Lactic Acid, Venous: 1.1 mmol/L (ref 0.5–1.9)

## 2023-03-01 LAB — TROPONIN I (HIGH SENSITIVITY): Troponin I (High Sensitivity): 8 ng/L (ref <18)

## 2023-03-01 LAB — PROTIME-INR
INR: 1.3 — ABNORMAL HIGH (ref 0.8–1.2)
Prothrombin Time: 16 s — ABNORMAL HIGH (ref 11.4–15.2)

## 2023-03-01 LAB — MRSA NEXT GEN BY PCR, NASAL: MRSA by PCR Next Gen: NOT DETECTED

## 2023-03-01 LAB — MAGNESIUM: Magnesium: 2.2 mg/dL (ref 1.7–2.4)

## 2023-03-01 LAB — PROCALCITONIN: Procalcitonin: <0.1 ng/mL

## 2023-03-01 MED ORDER — LACTATED RINGERS IV BOLUS (SEPSIS)
250.0000 mL | Freq: Once | INTRAVENOUS | Status: AC
  Administered 2023-03-01: 250 mL via INTRAVENOUS

## 2023-03-01 MED ORDER — SODIUM CHLORIDE 0.9 % IV SOLN
2.0000 g | INTRAVENOUS | Status: DC
  Administered 2023-03-01: 2 g via INTRAVENOUS
  Filled 2023-03-01 (×2): qty 20

## 2023-03-01 MED ORDER — DEXTROSE 50 % IV SOLN
1.0000 | Freq: Once | INTRAVENOUS | Status: AC
  Administered 2023-03-01: 50 mL via INTRAVENOUS

## 2023-03-01 MED ORDER — POLYETHYLENE GLYCOL 3350 17 G PO PACK: 17.0000 g | PACK | Freq: Every day | ORAL | Status: DC | PRN

## 2023-03-01 MED ORDER — ATROPINE SULFATE 1 MG/10ML IJ SOSY
PREFILLED_SYRINGE | INTRAMUSCULAR | Status: AC
  Filled 2023-03-01: qty 10

## 2023-03-01 MED ORDER — HEPARIN SODIUM (PORCINE) 5000 UNIT/ML IJ SOLN
5000.0000 [IU] | Freq: Three times a day (TID) | INTRAMUSCULAR | Status: DC
  Administered 2023-03-01 – 2023-03-02 (×3): 5000 [IU] via SUBCUTANEOUS
  Filled 2023-03-01 (×3): qty 1

## 2023-03-01 MED ORDER — LACTATED RINGERS IV BOLUS (SEPSIS)
1000.0000 mL | Freq: Once | INTRAVENOUS | Status: AC
  Administered 2023-03-01: 1000 mL via INTRAVENOUS

## 2023-03-01 MED ORDER — DOCUSATE SODIUM 100 MG PO CAPS: 100.0000 mg | ORAL_CAPSULE | Freq: Two times a day (BID) | ORAL | Status: DC | PRN

## 2023-03-01 MED ORDER — DEXTROSE 50 % IV SOLN
INTRAVENOUS | Status: AC
  Administered 2023-03-02: 50 mL
  Filled 2023-03-01: qty 50

## 2023-03-01 MED ORDER — ATROPINE SULFATE 1 MG/10ML IJ SOSY
1.0000 mg | PREFILLED_SYRINGE | Freq: Once | INTRAMUSCULAR | Status: AC
  Administered 2023-03-01: 1 mg via INTRAVENOUS

## 2023-03-01 MED ORDER — SODIUM CHLORIDE 0.9 % IV SOLN
250.0000 mL | INTRAVENOUS | Status: AC
  Administered 2023-03-02: 250 mL via INTRAVENOUS

## 2023-03-01 MED ORDER — NOREPINEPHRINE 4 MG/250ML-% IV SOLN
2.0000 ug/min | INTRAVENOUS | Status: DC
  Administered 2023-03-01: 2 ug/min via INTRAVENOUS
  Filled 2023-03-01: qty 250

## 2023-03-01 MED ORDER — SODIUM CHLORIDE 0.9 % IV BOLUS
1000.0000 mL | Freq: Once | INTRAVENOUS | Status: AC
  Administered 2023-03-01: 1000 mL via INTRAVENOUS

## 2023-03-01 MED ORDER — LACTATED RINGERS IV SOLN: INTRAVENOUS | Status: DC

## 2023-03-01 MED ORDER — HYDROCORTISONE SOD SUC (PF) 100 MG IJ SOLR
50.0000 mg | Freq: Three times a day (TID) | INTRAMUSCULAR | Status: DC
  Administered 2023-03-02 (×2): 50 mg via INTRAVENOUS
  Filled 2023-03-01 (×2): qty 2

## 2023-03-01 MED ORDER — CHLORHEXIDINE GLUCONATE CLOTH 2 % EX PADS
6.0000 | MEDICATED_PAD | Freq: Every day | CUTANEOUS | Status: DC
Start: 2023-03-01 — End: 2023-03-04
  Administered 2023-03-01 – 2023-03-02 (×2): 6 via TOPICAL

## 2023-03-01 MED ORDER — LACTATED RINGERS IV BOLUS
1000.0000 mL | Freq: Once | INTRAVENOUS | Status: AC
  Administered 2023-03-01: 1000 mL via INTRAVENOUS

## 2023-03-01 MED ORDER — NOREPINEPHRINE 4 MG/250ML-% IV SOLN
0.0000 ug/min | INTRAVENOUS | Status: DC
  Administered 2023-03-01: 2 ug/min via INTRAVENOUS
  Filled 2023-03-01: qty 250

## 2023-03-01 NOTE — Consult Note (Signed)
Pharmacy Consult Note - Electrolytes  Sodium (mmol/L)  Date Value  03/01/2023 135  02/24/2012 143   Potassium (mmol/L)  Date Value  03/01/2023 4.2  02/24/2012 4.1   Magnesium (mg/dL)  Date Value  16/01/9603 1.7  02/23/2012 2.1   Calcium (mg/dL)  Date Value  54/12/8117 9.0   Calcium, Total (mg/dL)  Date Value  14/78/2956 9.1   Albumin (g/dL)  Date Value  21/30/8657 3.0 (L)  02/22/2012 3.6   Phosphorus (mg/dL)  Date Value  84/69/6295 2.8    ASSESSMENT: 82 y.o. male with PMH including Afib, bilateral AKA, CHF, pancreatitis, HTN who presents with hypotension requiring vasopressors. Per family patient started mirtazapine as a new medication last night and since then has been hypotensive. Pharmacy has been consulted to monitor and replace electrolytes. Patient in AKI with Scr 1.48, up from baseline of 0.9-1.1.  Lab Results  Component Value Date   CREATININE 1.48 (H) 03/01/2023   CREATININE 1.10 11/23/2022   CREATININE 1.74 (H) 11/21/2022    Pertinent medications: n/a  Fluids: LR boluses x 3 and LR @ 150 cc/hr  Goal of Therapy:  [x]  Electrolytes WNL []  K >=4.0 and Mg >=2.0  PLAN: No electrolyte replacement currently necessary Check electrolytes (renal function panel and magnesium) with next AM labs   Thank you for allowing pharmacy to be a part of this patient's care.  Will M. Dareen Piano, PharmD Clinical Pharmacist 03/01/2023 2:56 PM

## 2023-03-01 NOTE — ED Notes (Signed)
Informed RN bed assigned 

## 2023-03-01 NOTE — ED Provider Notes (Signed)
[facility] Provider Note   Event Date/Time   First MD Initiated Contact with Patient 03/01/23 1204     (approximate) History  Hypotension  HPI [name] [name] is a 82 y.o. male with a past medical history of bilateral lower extremity amputations, diabetes, hypertension, CHF, and previous CVA who presents from home for hypotension.  Family states that patient had just darted a new medication (mirtazapine) last night and since that time patient has had low blood pressure readings at home.  Family notes blood pressure readings of 90s/60s.  Patient denies any complaints at this time however family states that patient has had similar issues in the past when he has had a urinary tract infection. ROS: Patient currently denies any vision changes, tinnitus, difficulty speaking, facial droop, sore throat, chest pain, shortness of breath, abdominal pain, nausea/vomiting/diarrhea, dysuria, or weakness/numbness/paresthesias in any extremity   Physical Exam  Triage Vital Signs: ED Triage Vitals  Encounter Vitals Group     BP 03/01/23 1127 101/72     Systolic BP Percentile --      Diastolic BP Percentile --      Pulse Rate 03/01/23 1130 (!) 48     Resp 03/01/23 1127 16     Temp --      Temp src --      SpO2 03/01/23 1127 97 %     Weight --      Height --      Head Circumference --      Peak Flow --      Pain Score 03/01/23 1125 0     Pain Loc --      Pain Education --      Exclude from Growth Chart --    Most recent vital signs: Vitals:   03/02/23 0600 03/02/23 0700  BP: 111/72 116/75  Pulse: 73   Resp: 16 17  Temp:    SpO2: 100%    General: Awake, oriented x4. CV:  Good peripheral perfusion.  Resp:  Normal effort.  Abd:  No distention.  Other:  Elderly African-American male resting comfortably in no acute distress.  Bilateral AKAs ED Results / Procedures / Treatments  Labs (all labs ordered are listed, but only abnormal results are displayed) Labs Reviewed   CBC WITH DIFFERENTIAL/PLATELET - Abnormal; Notable for the following components:      Result Value   WBC 3.6 (*)    RBC 4.11 (*)    Hemoglobin 11.8 (*)    HCT 34.1 (*)    Platelets 110 (*)    Neutro Abs 1.3 (*)    All other components within normal limits  URINALYSIS, ROUTINE W REFLEX MICROSCOPIC - Abnormal; Notable for the following components:   Color, Urine YELLOW (*)    APPearance TURBID (*)    Hgb urine dipstick MODERATE (*)    Leukocytes,Ua MODERATE (*)    Bacteria, UA MANY (*)    All other components within normal limits  COMPREHENSIVE METABOLIC PANEL - Abnormal; Notable for the following components:   Creatinine, Ser 1.48 (*)    Albumin 3.0 (*)    GFR, Estimated 47 (*)    All other components within normal limits  PROTIME-INR - Abnormal; Notable for the following components:   Prothrombin Time 16.0 (*)    INR 1.3 (*)    All other components within normal limits  APTT - Abnormal; Notable for the following components:   aPTT 37 (*)    All other components within normal limits  BLOOD GAS, ARTERIAL - Abnormal; Notable for the following components:   pH, Arterial 7.31 (*)    pO2, Arterial 61 (*)    Acid-base deficit 2.9 (*)    All other components within normal limits  CBC - Abnormal; Notable for the following components:   WBC 3.7 (*)    RBC 3.32 (*)    Hemoglobin 9.5 (*)    HCT 28.0 (*)    Platelets 95 (*)    All other components within normal limits  RENAL FUNCTION PANEL - Abnormal; Notable for the following components:   Glucose, Bld 116 (*)    Calcium 8.6 (*)    Phosphorus 2.0 (*)    Albumin 2.5 (*)    All other components within normal limits  GLUCOSE, CAPILLARY - Abnormal; Notable for the following components:   Glucose-Capillary 56 (*)    All other components within normal limits  GLUCOSE, CAPILLARY - Abnormal; Notable for the following components:   Glucose-Capillary 175 (*)    All other components within normal limits  GLUCOSE, CAPILLARY - Abnormal;  Notable for the following components:   Glucose-Capillary 104 (*)    All other components within normal limits  GLUCOSE, CAPILLARY - Abnormal; Notable for the following components:   Glucose-Capillary 58 (*)    All other components within normal limits  GLUCOSE, CAPILLARY - Abnormal; Notable for the following components:   Glucose-Capillary 124 (*)    All other components within normal limits  GLUCOSE, CAPILLARY - Abnormal; Notable for the following components:   Glucose-Capillary 104 (*)    All other components within normal limits  GLUCOSE, CAPILLARY - Abnormal; Notable for the following components:   Glucose-Capillary 101 (*)    All other components within normal limits  RESP PANEL BY RT-PCR (RSV, FLU A&B, COVID)  RVPGX2  CULTURE, BLOOD (ROUTINE X 2)  CULTURE, BLOOD (ROUTINE X 2)  MRSA NEXT GEN BY PCR, NASAL  URINE CULTURE  LACTIC ACID, PLASMA  LACTIC ACID, PLASMA  PROCALCITONIN  CORTISOL  GLUCOSE, CAPILLARY  MAGNESIUM  MAGNESIUM  LACTIC ACID, PLASMA  GLUCOSE, CAPILLARY  THYROID PANEL WITH TSH  PROCALCITONIN  TROPONIN I (HIGH SENSITIVITY)   EKG ED ECG REPORT I, Merwyn Katos, the attending physician, personally viewed and interpreted this ECG. Date: 03/01/2023 EKG Time: 1331 Rate: 43 Rhythm: Bradycardic atrial fibrillation QRS Axis: normal Intervals: normal ST/T Wave abnormalities: normal Narrative Interpretation: Bradycardic atrial fibrillation.  No evidence of acute ischemia RADIOLOGY ED MD interpretation: Pending -Agree with radiology assessment Official radiology report(s): MR BRAIN WO CONTRAST  Result Date: 03/02/2023 CLINICAL DATA:  82 year old male altered mental status. Hypotension. Lethargy. 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 New left frontal lobe lesion on CT. EXAM: MRI HEAD WITHOUT CONTRAST TECHNIQUE: Multiplanar, multiecho pulse sequences of the brain and surrounding structures were obtained without intravenous contrast. COMPARISON:  Head CT  03/01/2023.  Brain MRI, CTA 10/26/2022. FINDINGS: Brain: Confirmed small area of intra-axial hemorrhage at the left middle or superior frontal gyrus corresponding to the CT finding (series 9, image 39 series 10, image 50), stable small volume (estimated 7 mm diameter). Mild adjacent edema or encephalomalacia and mild susceptibility artifact on DWI there. Furthermore, pre-existing blood products here on the July MRI, no T2 signal of a discrete cavernous venous malformation at that time. No larger area of left MCA restricted diffusion. No extra-axial or intraventricular hemorrhage is identified. Compared to 10/26/2022 hemosiderin in the right MCA territory has mildly progressed (series 10, image 45), and there is new  hemosiderin in the lateral right occipital pole also (image 29). And encephalomalacia in those areas has progressed. No restricted diffusion elsewhere. No midline shift or ventriculomegaly. 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 Cervicomedullary junction and pituitary are within normal limits. Vascular: Major intracranial vascular flow voids are stable, but chronically abnormal at the vertebrobasilar junction. There are fetal type PCA origins and thus a diminutive proximal vertebrobasilar system. But also abnormal vertebrobasilar atherosclerosis and stenosis was demonstrated by CTA in July. Skull and upper cervical spine: Partially visible cervical spine degeneration. Background bone marrow signal within normal limits. Sinuses/Orbits: Stable. Other: Mastoids are clear. Grossly normal visible internal auditory structures. IMPRESSION: 1. Confirmed small acute on chronic intra-axial hemorrhage at the left middle or superior frontal gyrus, corresponding to the CT finding. No mass effect or complicating features. Favor acute on chronic small vessel disease, as some pre-existing hemosiderin but no convincing cavernoma there on July MRI. 2. No other acute finding. Underlying advanced ischemic disease.  Progressed posterior right MCA and right MCA/PCA watershed territory encephalomalacia and hemosiderin since July. Underlying chronic vertebrobasilar atherosclerosis and stenosis. Electronically Signed   By: Odessa Fleming M.D.   On: 03/02/2023 05:19   CT HEAD WO CONTRAST ( )  Result Date: 03/01/2023 CLINICAL DATA:  Mental status change, unknown cause. Hypotension. Lethargy. EXAM: CT HEAD WITHOUT CONTRAST TECHNIQUE: Contiguous axial images were obtained from the base of the skull through the vertex without intravenous contrast. RADIATION DOSE REDUCTION: This exam was performed according to the departmental dose-optimization program which includes automated exposure control, adjustment of the mA and/or kV according to patient size and/or use of iterative reconstruction technique. COMPARISON:  Head CT and MRI 10/26/2022 FINDINGS: Brain: There is a 10 mm hyperdense cortical/subcortical lesion in the high posterior left frontal lobe which has increased in size and conspicuity compared to the prior CT where in retrospect a more subtly hyperdense 5 mm lesion was present. There was corresponding susceptibility and FLAIR hypointensity in this location on MRI. There is no associated edema. The large right MCA infarct on the previous studies demonstrates expected interval evolution with development of encephalomalacia and mild ex vacuo dilatation of the right lateral ventricle. There is a background of mild chronic small vessel ischemia in the cerebral white matter. Small chronic infarcts are again seen in the right lentiform nucleus and cerebellum. Mild cerebral atrophy is within normal limits for age. No acute large territory infarct, midline shift, or extra-axial fluid collection is evident. Vascular: Calcified atherosclerosis at the skull base. No hyperdense vessel. Skull: No fracture or suspicious osseous lesion. Sinuses/Orbits: Paranasal sinuses and mastoid air cells are clear. Bilateral cataract extraction. Other: None.  These results will be called to the ordering clinician or representative by the Radiologist Assistant, and communication documented in the PACS or [employer]. IMPRESSION: 1. Increased size of a 10 mm hyperdense lesion in the high posterior left frontal lobe, indeterminate but could reflect interval hemorrhage within a cavernoma or a small tumor/metastasis. No associated edema. Brain MRI without and with contrast could further evaluate. 2. Expected interval evolution of a now chronic right MCA infarct. Electronically Signed   By: Sebastian Ache M.D.   On: 03/01/2023 17:42   DG Chest Port 1 View  Result Date: 03/01/2023 CLINICAL DATA:  Questionable sepsis EXAM: PORTABLE CHEST 1 VIEW COMPARISON:  X-ray 10/26/2022. FINDINGS: No consolidation, pneumothorax or effusion. No edema. Normal cardiopericardial silhouette. Calcified tortuous aorta. Overlapping cardiac leads and defibrillator pads. Film is rotated to the left IMPRESSION: No acute cardiopulmonary disease.  Electronically Signed   By: Karen Kays M.D.   On: 03/01/2023 15:26   PROCEDURES: Critical Care performed: Yes, see critical care procedure note(s) .1-3 Lead EKG Interpretation  Performed by: Merwyn Katos, MD Authorized by: Merwyn Katos, MD     Interpretation: abnormal     ECG rate:  39   ECG rate assessment: bradycardic     Rhythm: atrial fibrillation     Ectopy: none     Conduction: normal   CRITICAL CARE Performed by: Merwyn Katos  Total critical care time: 33 minutes  Critical care time was exclusive of separately billable procedures and treating other patients.  Critical care was necessary to treat or prevent imminent or life-threatening deterioration.  Critical care was time spent personally by me on the following activities: development of treatment plan with patient and/or surrogate as well as nursing, discussions with consultants, evaluation of patient's response to treatment, examination of patient, obtaining  history from patient or surrogate, ordering and performing treatments and interventions, ordering and review of laboratory studies, ordering and review of radiographic studies, pulse oximetry and re-evaluation of patient's condition.  MEDICATIONS ORDERED IN ED: Medications  lactated ringers infusion ( Intravenous Infusion Verify 03/02/23 0600)  cefTRIAXone (ROCEPHIN) 2 g in sodium chloride 0.9 % 100 mL IVPB (0 g Intravenous Stopped 03/01/23 1342)  docusate sodium (COLACE) capsule 100 mg (has no administration in time range)  polyethylene glycol (MIRALAX / GLYCOLAX) packet 17 g (has no administration in time range)  0.9 %  sodium chloride infusion (has no administration in time range)  norepinephrine (LEVOPHED) 4mg  in (0.016 mg/mL) premix infusion (0 mcg/min Intravenous Stopped 03/02/23 0210)  Chlorhexidine Gluconate Cloth 2 % PADS 6 each (6 each Topical Given 03/01/23 1621)  hydrocortisone sodium succinate (SOLU-CORTEF) 100 MG injection 50 mg (50 mg Intravenous Given 03/02/23 0014)  dextrose 50 % solution (  Not Given 03/02/23 0431)  dextrose 10 % infusion ( Intravenous Infusion Verify 03/02/23 0600)  magnesium sulfate IVPB 2 g 50 mL (has no administration in time range)  potassium PHOSPHATE 15 mmol in dextrose 5 % 250 mL infusion (has no administration in time range)  sodium chloride 0.9 % bolus 1,000 mL (1,000 mLs Intravenous New Bag/Given 03/01/23 1310)  lactated ringers bolus 1,000 mL (0 mLs Intravenous Stopped 03/01/23 1506)    And  lactated ringers bolus 1,000 mL (0 mLs Intravenous Stopped 03/01/23 1621)    And  lactated ringers bolus 250 mL (0 mLs Intravenous Stopped 03/01/23 1622)  lactated ringers bolus 1,000 mL ( Intravenous Infusion Verify 03/01/23 1733)  atropine 1 MG/10ML injection 1 mg (0 mg Intravenous Duplicate 03/01/23 1653)  dextrose 50 % solution 50 mL (50 mLs Intravenous Given 03/01/23 2353)  dextrose 50 % solution (50 mLs  Given 03/02/23 0417)  dextrose 50 % solution 50 mL (50  mLs Intravenous Given 03/02/23 0417)   IMPRESSION / MDM / ASSESSMENT AND PLAN / ED COURSE  I reviewed the triage vital signs and the nursing notes.                             The patient is on the cardiac monitor to evaluate for evidence of arrhythmia and/or significant heart rate changes. Patient's presentation is most consistent with acute presentation with potential threat to life or bodily function. The Pt presents with hypotension highly concerning for sepsis (suspected urinary source). At this time, the Pt is satting well on room  air, hypotensive, and bradycardic.  Will start empiric antibiotics and fluids.  Due to history of CHF, will administer fluids gradually with frequent reassessment. Have low suspicion for a GI, skin/soft tissue, or CNS source at this time, but will reconsider if initial workup is unremarkable.  - CBC, BMP, LFTs - VBG - UA - BCx x2, Lactate - EKG - CXR - Empiric Abx: Rocephin - Fluids: 30cc/kg LR  Patient has had persistent hypotension since arrival and despite first liter of fluids is persistently hypotensive.  Patient started on Levophed drip for pressure support.  I spoke to Dr. Belia Heman in the ICU who has graciously agreed to accept this patient onto their service.  Dispo: Admit to the ICU   FINAL CLINICAL IMPRESSION(S) / ED DIAGNOSES   Final diagnoses:  Hypotension, unspecified hypotension type  Bradycardia   Rx / DC Orders   ED Discharge Orders     None      Note:  This document was prepared using Dragon voice recognition software and may include unintentional dictation errors.   Merwyn Katos, MD 03/02/23 [phone]

## 2023-03-01 NOTE — IPAL (Signed)
  Interdisciplinary Goals of Care Family Meeting   Date carried out: 03/01/2023  Location of the meeting: Bedside  Member's involved: Physician, Nurse Practitioner, and Family Member or next of kin Students    GOALS OF CARE DISCUSSION  The Clinical status was relayed to family in detail- Wife and sister in law  Updated and notified of patients medical condition- Patient remains unresponsive and will not open eyes to command.   Patient is having a weak cough  Explained to family course of therapy and the modalities   Patient with Progressive multiorgan failure with a very high probablity of a very minimal chance of meaningful recovery despite all aggressive and optimal medical therapy.   PATIENT IS DNR/DNI  Family understands the situation.  Continue aggressive medical care with fluids, ABX, pressors  Family are satisfied with Plan of action and management. All questions answered  Additional CC time 25 mins   Quentin Shorey Santiago Glad, M.D.  [facility]  Medical Director [facility] Medical Director [facility] Cardio-Pulmonary Department

## 2023-03-01 NOTE — Consult Note (Signed)
CODE SEPSIS - PHARMACY COMMUNICATION  **Broad-spectrum antimicrobials should be administered within one hour of sepsis diagnosis**  Time Code Sepsis call or page was received: 1256  Antibiotics ordered: Ceftriaxone  Time of first antibiotic administration: 1322  Additional action taken by pharmacy: N/A  If necessary, name of provider/nurse contacted: N/A    Will M. Dareen Piano, PharmD Clinical Pharmacist 03/01/2023 12:58 PM

## 2023-03-01 NOTE — ED Triage Notes (Signed)
Pt came from home via EMS d/t nonsymptomatic hypotension. Pt is bilateral amputee. Pt has hx of A fib, CVA, Hypertension.

## 2023-03-01 NOTE — H&P (Signed)
NAME:  Delmon Andrada, MRN:  [id number], DOB:  [birth year], LOS: 0 ADMISSION DATE:  03/01/2023  CHIEF COMPLAINT:  Hypotension, AMS  BRIEF SYNOPSIS 82 year old male presents with symptomatic hypotension at home, brought to the ED by EMS  History of Present Illness:  Mr. Galdamez is an 82 year old male who was brought to the ED due to low BP reading and lethargy at home, wife reports that SBP was in the 70s. Patient BP readings are typically in the 110's. Upon arrival the patient was bradycardic, hypothermic and hypotensive.   Wife reports that the patient has minimal fluid intake on a regular basis, a history of malnutrition and hypoalbuminemia secondary to poor protein intake.  ED course:  Code sepsis activated in ED and blood cultures collected. -On Lawyer -IVF given(1L) -Started on Levophed gtt -Given 1 dose Ceftriaxone  Acute kidney injury LA 1.2  PCCM consulted to admit for sepsis Patient unresponsive/comatose and  ill appearing Wife at bedside  Pertinent  Medical History  A-fib Diabetes Bilateral AKA CHF Hx of pancreatitis Hypertension Peripheral Artery Disease Anemia of Chronic Disease Cerebral vascular accident  GERD  Significant Hospital Events: Including procedures, antibiotic start and stop dates in addition to other pertinent events   11/7: Patient started on low dose Levo for hypotension with septic shock, given ceftriaxone in ED. Patient admitted to ICU.   Micro Data:  Blood cultures >> pending 11/7: Sars-COVID >> negative  Antimicrobials:   Antibiotics Given (last 72 hours)     Date/Time Action Medication Dose Rate   03/01/23 1322 New Bag/Given   cefTRIAXone (ROCEPHIN) 2 g in sodium chloride 0.9 % 100 mL IVPB 2 g 200 mL/hr         Interim History / Subjective:  Upon arrival to the ED, patient developed worsening hypotension with altered mental status and hypothermia requiring the bair hugger. Started on levophed for hemodynamic  stability    Objective   Blood pressure 130/71, pulse (!) 42, temperature (!) 92.8 F (33.8 C), temperature source Rectal, resp. rate 14, SpO2 94%.       No intake or output data in the 24 hours ending 03/01/23 1442 There were no vitals filed for this visit.   REVIEW OF SYSTEMS UNABLE TO PROVIDE ROS DUE TO CRITICAL ILLNESS  PHYSICAL EXAMINATION:  GENERAL:critically ill appearing EYES: Pupils equal, round, reactive to light.  No scleral icterus.  MOUTH: Moist mucosal membrane.  NECK: Supple.  PULMONARY: Lungs clear to auscultation,  CARDIOVASCULAR: S1 and S2.  Bradycardic rate and Irregular rhythm GASTROINTESTINAL: Soft, nontender, -distended. Positive bowel sounds.  MUSCULOSKELETAL: b/l AKA NEUROLOGIC: Able to follow simple commands, non-purposeful movements  SKIN: Intact, no apparent wounds visible, delayed capillary refill  Pulses present bilaterally    ASSESSMENT AND PLAN SYNOPSIS  82 yo AAM with multiple medical issues admitted for acute severe septic /hypovolumic shock with hypothermia and UTI associated with acute renal failure leading to acute metabolic encephalopathy, high risk for aspiration and high risk for cardiac arrest   SEPTIC/Hypovolumic  shock SOURCE-UTI -use vasopressors to keep MAP>65 as needed -follow ABG and LA as needed -follow up cultures -emperic ABX -consider stress dose steroids -aggressive IV fluid Resuscitation   CARDIAC Paroxysmal Atrial fibrillation  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 - ICU cardiac monitoring - Rate control if indicated - Holding Coreg for now - Continue Levo gtt, wean as tolerated  - MAP goal >65 - Trending troponin - Hold anticoagulation until CT head completed  - CXR  unremarkable  - Follow ABG and LA - PCT WNL - Pending blood cultures   NEUROLOGY Acute metabolic encephalopathy - Head CT ordered to r/o head bleed prior to initiating AC - Sedation as needed, not currently  required  RENAL ACUTE KIDNEY INJURY likely secondary to hypovolemia/sepsis - Daily BMP - Monitor electrolytes, replace as needed - Strict I&O - Trend renal function - UA ordered to assess if UTI could be a source - ABG fairly normal, will consider rechecking if mentation does not improve Place foley IVF's  No intake or output data in the 24 hours ending 03/01/23 1511  HEME Mild anemia of chronic disease  - Consider transfusion if HGB<7 - DVT PRX until head CT completed - Daily CBC - Monitor Hemoglobin trends - Coagulation panel  ENDO Type 2 Diabetes Mellitus Suspected Adrenal Insufficiency - TSH and cortisol ordered - Hypo/hyperglycemia protocol - SSI when indicated - A1C ordered  GI -Consider GI prophylaxis if indicated  NUTRITIONAL STATUS -Patient NPO   Best practice (right click and "Reselect all SmartList Selections" daily)  Diet: NPO GI prophylaxis: N/A Mobility:  bed rest  Code Status: DNR/DNI Disposition:ICU  Labs   CBC: Recent Labs  Lab 03/01/23 1239  WBC 3.6*  NEUTROABS 1.3*  HGB 11.8*  HCT 34.1*  MCV 83.0  PLT 110*    Basic Metabolic Panel: Recent Labs  Lab 03/01/23 1239  NA 135  K 4.2  CL 102  CO2 24  GLUCOSE 81  BUN 15  CREATININE 1.48*  CALCIUM 9.0   GFR: CrCl cannot be calculated (Unknown ideal weight.). Recent Labs  Lab 03/01/23 1239 03/01/23 1316  PROCALCITON <0.10  --   WBC 3.6*  --   LATICACIDVEN  --  1.2    Liver Function Tests: Recent Labs  Lab 03/01/23 1239  AST 26  ALT 22  ALKPHOS 71  BILITOT 1.1  PROT 7.0  ALBUMIN 3.0*   No results for input(s): "LIPASE", "AMYLASE" in the last 168 hours. No results for input(s): "AMMONIA" in the last 168 hours.  ABG    Component Value Date/Time   HCO3 29.4 (H) 10/27/2022 0440   O2SAT 100 10/27/2022 0440     Coagulation Profile: Recent Labs  Lab 03/01/23 1316  INR 1.3*    Cardiac Enzymes: No results for input(s): "CKTOTAL", "CKMB", "CKMBINDEX",  "TROPONINI" in the last 168 hours.  HbA1C: Hemoglobin A1C  Date/Time Value Ref Range Status  02/23/2012 04:44 AM 6.6 (H) 4.2 - 6.3 % Final    Comment:    The American Diabetes Association recommends that a primary goal of therapy should be <7% and that physicians should reevaluate the treatment regimen in patients with HbA1c values consistently >8%.    Hgb A1c MFr Bld  Date/Time Value Ref Range Status  10/26/2022 11:36 AM 5.1 4.8 - 5.6 % Final    Comment:    (NOTE) Pre diabetes:          5.7%-6.4%  Diabetes:              >6.4%  Glycemic control for   <7.0% adults with diabetes   08/10/2022 04:18 AM 5.5 4.8 - 5.6 % Final    Comment:    (NOTE) Pre diabetes:          5.7%-6.4%  Diabetes:              >6.4%  Glycemic control for   <7.0% adults with diabetes     CBG: No results for input(s): "GLUCAP" in  the last 168 hours.   Past Medical History:  He,  has a past medical history of CHF (congestive heart failure) (HCC), Diabetes (HCC), Dysrhythmia, HTN (hypertension), Infection, and Pancreatitis.   Surgical History:   Past Surgical History:  Procedure Laterality Date   ABOVE KNEE LEG AMPUTATION Bilateral    AMPUTATION Right 03/09/2021   Procedure: AMPUTATION FOOT;  Surgeon: Annice Needy, MD;  Location: [facility];  Service: General;  Laterality: Right;   AMPUTATION Right 03/11/2021   Procedure: AMPUTATION ABOVE KNEE;  Surgeon: Annice Needy, MD;  Location: [facility];  Service: General;  Laterality: Right;   AMPUTATION Left 08/13/2022   Procedure: AMPUTATION ABOVE KNEE;  Surgeon: Bertram Denver, MD;  Location: [facility];  Service: Vascular;  Laterality: Left;   CATARACT EXTRACTION W/PHACO Right 03/08/2017   Procedure: CATARACT EXTRACTION PHACO AND INTRAOCULAR LENS PLACEMENT (IOC);  Surgeon: Nevada Crane, MD;  Location: [facility];  Service: Ophthalmology;  Laterality: Right;  Lot# [id number] H [place]: 00:30.9 AP%: 8.2 CDE: 2.54   CATARACT EXTRACTION W/PHACO Left 04/04/2017    Procedure: CATARACT EXTRACTION PHACO AND INTRAOCULAR LENS PLACEMENT (IOC);  Surgeon: Nevada Crane, MD;  Location: [facility];  Service: Ophthalmology;  Laterality: Left;  [place] 00:31.4 AP% 12.0 CDE 3.78 Fluid Pack lot # [id number]   CHOLECYSTECTOMY     EYE SURGERY     right eye   LOWER EXTREMITY ANGIOGRAPHY Right 02/10/2021   Procedure: LOWER EXTREMITY ANGIOGRAPHY;  Surgeon: Annice Needy, MD;  Location: [facility];  Service: Cardiovascular;  Laterality: Right;   LOWER EXTREMITY ANGIOGRAPHY Left 07/03/2022   Procedure: Lower Extremity Angiography;  Surgeon: Annice Needy, MD;  Location: [facility];  Service: Cardiovascular;  Laterality: Left;   LOWER EXTREMITY ANGIOGRAPHY Left 07/24/2022   Procedure: Lower Extremity Angiography;  Surgeon: Annice Needy, MD;  Location: [facility];  Service: Cardiovascular;  Laterality: Left;   LOWER EXTREMITY ANGIOGRAPHY Left 08/11/2022   Procedure: Lower Extremity Angiography;  Surgeon: Annice Needy, MD;  Location: [facility];  Service: Cardiovascular;  Laterality: Left;     Social History:   reports that he has never smoked. His smokeless tobacco use includes chew. He reports that he does not drink alcohol and does not use drugs.   Family History:  His family history is not on file.   Allergies No Known Allergies   Home Medications  Prior to Admission medications   Medication Sig Start Date End Date Taking? Authorizing Provider  ascorbic acid (VITAMIN C) 500 MG tablet Take 500 mg by mouth daily. 10/06/21   [provider]  aspirin EC 81 MG tablet Take 1 tablet (81 mg total) by mouth daily. Swallow whole. 07/03/22 07/03/23  Annice Needy, MD  atorvastatin (LIPITOR) 10 MG tablet Take 1 tablet (10 mg total) by mouth daily. 02/10/21 01/10/23  Annice Needy, MD  carvedilol (COREG) 6.25 MG tablet Take 1 tablet (6.25 mg total) by mouth 2 (two) times daily. HOLD THIS MEDICATION UNTIL PT SEES CARDIO SECONDARY TO  BRADYCARDIA 11/02/22   Charise Killian, MD  ferrous sulfate 325 (65 FE) MG tablet Take 325 mg by mouth daily with breakfast. 05/11/21 01/10/23  [provider]  furosemide (LASIX) 20 MG tablet Take 20 mg daily by mouth. 12/19/16   [provider]  gabapentin (NEURONTIN) 100 MG capsule Take 1 capsule (100 mg total) by mouth 3 (three) times daily. 03/16/21 01/10/23  Charise Killian, MD  lisinopril (PRINIVIL,ZESTRIL) 10 MG tablet Take 15 mg by mouth daily.    [provider]  vitamin B-12 (CYANOCOBALAMIN) 1000 MCG tablet Take 1,000 mcg by mouth daily.    [provider]       DVT/GI PRX  assessed I Assessed the need for Labs I Assessed the need for Foley I Assessed the need for Central Venous Line Family Discussion when available I Assessed the need for Mobilization I made an Assessment of medications to be adjusted accordingly Safety Risk assessment completed  CASE DISCUSSED IN MULTIDISCIPLINARY ROUNDS WITH ICU TEAM     Critical Care Time devoted to patient care services described in this note is 55 minutes.   Critical care was necessary to treat /prevent imminent and life-threatening deterioration.   PATIENT WITH VERY POOR PROGNOSIS I ANTICIPATE PROLONGED ICU LOS  Patient is critically ill. Patient with Multiorgan failure and at high risk for cardiac arrest and death.    Lucie Leather, M.D.  [facility]  Medical Director [facility] Medical Director [facility] Cardio-Pulmonary Department

## 2023-03-01 NOTE — Progress Notes (Signed)
[facility] is following Code sepsis.

## 2023-03-02 LAB — CBC
HCT: 28 % — ABNORMAL LOW (ref 39.0–52.0)
Hemoglobin: 9.5 g/dL — ABNORMAL LOW (ref 13.0–17.0)
MCH: 28.6 pg (ref 26.0–34.0)
MCHC: 33.9 g/dL (ref 30.0–36.0)
MCV: 84.3 fL (ref 80.0–100.0)
Platelets: 95 10*3/uL — ABNORMAL LOW (ref 150–400)
RBC: 3.32 MIL/uL — ABNORMAL LOW (ref 4.22–5.81)
RDW: 15.2 % (ref 11.5–15.5)
WBC: 3.7 10*3/uL — ABNORMAL LOW (ref 4.0–10.5)
nRBC: 0 % (ref 0.0–0.2)

## 2023-03-02 LAB — RENAL FUNCTION PANEL
Albumin: 2.5 g/dL — ABNORMAL LOW (ref 3.5–5.0)
Anion gap: 6 (ref 5–15)
BUN: 11 mg/dL (ref 8–23)
CO2: 26 mmol/L (ref 22–32)
Calcium: 8.6 mg/dL — ABNORMAL LOW (ref 8.9–10.3)
Chloride: 109 mmol/L (ref 98–111)
Creatinine, Ser: 1.2 mg/dL (ref 0.61–1.24)
GFR, Estimated: >60 mL/min (ref >60)
Glucose, Bld: 116 mg/dL — ABNORMAL HIGH (ref 70–99)
Phosphorus: 2 mg/dL — ABNORMAL LOW (ref 2.5–4.6)
Potassium: 3.7 mmol/L (ref 3.5–5.1)
Sodium: 141 mmol/L (ref 135–145)

## 2023-03-02 LAB — MAGNESIUM: Magnesium: 1.7 mg/dL (ref 1.7–2.4)

## 2023-03-02 LAB — GLUCOSE, CAPILLARY
Glucose-Capillary: 101 mg/dL — ABNORMAL HIGH (ref 70–99)
Glucose-Capillary: 104 mg/dL — ABNORMAL HIGH (ref 70–99)
Glucose-Capillary: 104 mg/dL — ABNORMAL HIGH (ref 70–99)
Glucose-Capillary: 124 mg/dL — ABNORMAL HIGH (ref 70–99)
Glucose-Capillary: 125 mg/dL — ABNORMAL HIGH (ref 70–99)
Glucose-Capillary: 137 mg/dL — ABNORMAL HIGH (ref 70–99)
Glucose-Capillary: 175 mg/dL — ABNORMAL HIGH (ref 70–99)
Glucose-Capillary: 200 mg/dL — ABNORMAL HIGH (ref 70–99)
Glucose-Capillary: 58 mg/dL — ABNORMAL LOW (ref 70–99)
Glucose-Capillary: 80 mg/dL (ref 70–99)

## 2023-03-02 LAB — PROCALCITONIN: Procalcitonin: <0.1 ng/mL

## 2023-03-02 LAB — THYROID PANEL WITH TSH
Free Thyroxine Index: 2.4 (ref 1.2–4.9)
T3 Uptake Ratio: 37 % (ref 24–39)
T4, Total: 6.5 ug/dL (ref 4.5–12.0)
TSH: 1.38 u[IU]/mL (ref 0.450–4.500)

## 2023-03-02 MED ORDER — FLUDROCORTISONE ACETATE 0.1 MG PO TABS
0.2000 mg | ORAL_TABLET | Freq: Every day | ORAL | Status: DC
  Administered 2023-03-02: 0.2 mg via ORAL
  Filled 2023-03-02: qty 2

## 2023-03-02 MED ORDER — DEXTROSE 50 % IV SOLN
INTRAVENOUS | Status: AC
  Filled 2023-03-02: qty 50

## 2023-03-02 MED ORDER — CIPROFLOXACIN HCL 500 MG PO TABS
250.0000 mg | ORAL_TABLET | Freq: Two times a day (BID) | ORAL | Status: DC
  Administered 2023-03-02 – 2023-03-03 (×4): 250 mg via ORAL
  Filled 2023-03-02: qty 0.5
  Filled 2023-03-02: qty 1
  Filled 2023-03-02 (×3): qty 0.5

## 2023-03-02 MED ORDER — DEXTROSE 10 % IV SOLN: INTRAVENOUS | Status: DC

## 2023-03-02 MED ORDER — HEPARIN SODIUM (PORCINE) 5000 UNIT/ML IJ SOLN
5000.0000 [IU] | Freq: Three times a day (TID) | INTRAMUSCULAR | Status: DC
  Administered 2023-03-02 – 2023-03-06 (×13): 5000 [IU] via SUBCUTANEOUS
  Filled 2023-03-02 (×13): qty 1

## 2023-03-02 MED ORDER — ADULT MULTIVITAMIN W/MINERALS CH
1.0000 | ORAL_TABLET | Freq: Every day | ORAL | Status: DC
  Administered 2023-03-03 – 2023-03-06 (×4): 1 via ORAL
  Filled 2023-03-02 (×4): qty 1

## 2023-03-02 MED ORDER — MAGNESIUM SULFATE 2 GM/50ML IV SOLN
2.0000 g | Freq: Once | INTRAVENOUS | Status: AC
  Administered 2023-03-02: 2 g via INTRAVENOUS
  Filled 2023-03-02: qty 50

## 2023-03-02 MED ORDER — ORAL CARE MOUTH RINSE
15.0000 mL | OROMUCOSAL | Status: DC | PRN
  Administered 2023-03-02: 15 mL via OROMUCOSAL

## 2023-03-02 MED ORDER — VITAMIN C 500 MG PO TABS
500.0000 mg | ORAL_TABLET | Freq: Two times a day (BID) | ORAL | Status: DC
Start: 2023-03-02 — End: 2023-03-06
  Administered 2023-03-02 – 2023-03-06 (×8): 500 mg via ORAL
  Filled 2023-03-02 (×8): qty 1

## 2023-03-02 MED ORDER — ENSURE ENLIVE PO LIQD: 237.0000 mL | Freq: Two times a day (BID) | ORAL | Status: DC

## 2023-03-02 MED ORDER — POTASSIUM PHOSPHATES 15 MMOLE/5ML IV SOLN
15.0000 mmol | Freq: Once | INTRAVENOUS | Status: AC
  Administered 2023-03-02: 15 mmol via INTRAVENOUS
  Filled 2023-03-02: qty 5

## 2023-03-02 MED ORDER — ENSURE ENLIVE PO LIQD
237.0000 mL | Freq: Three times a day (TID) | ORAL | Status: DC
  Administered 2023-03-02 – 2023-03-06 (×13): 237 mL via ORAL

## 2023-03-02 MED ORDER — DEXTROSE 50 % IV SOLN
1.0000 | Freq: Once | INTRAVENOUS | Status: AC
  Administered 2023-03-02: 50 mL via INTRAVENOUS

## 2023-03-02 NOTE — Consult Note (Signed)
Pharmacy Consult Note - Electrolytes  Sodium (mmol/L)  Date Value  03/02/2023 141  02/24/2012 143   Potassium (mmol/L)  Date Value  03/02/2023 3.7  02/24/2012 4.1   Magnesium (mg/dL)  Date Value  81/19/1478 1.7  02/23/2012 2.1   Calcium (mg/dL)  Date Value  29/56/2130 8.6 (L)   Calcium, Total (mg/dL)  Date Value  86/57/8469 9.1   Albumin (g/dL)  Date Value  62/95/2841 2.5 (L)  02/22/2012 3.6   Phosphorus (mg/dL)  Date Value  32/44/0102 2.0 (L)   ASSESSMENT: 82 y.o. male with PMH including Afib, bilateral AKA, CHF, pancreatitis, HTN who presents with hypotension requiring vasopressors. Per family patient started mirtazapine as a new medication last night and since then has been hypotensive. Pharmacy has been consulted to monitor and replace electrolytes. Patient in AKI with Scr 1.48, up from baseline of 0.9-1.1.  Lab Results  Component Value Date   CREATININE 1.20 03/02/2023   CREATININE 1.48 (H) 03/01/2023   CREATININE 1.10 11/23/2022   Fluids: LR boluses x 3 and LR @ 150 cc/hr  Goal of Therapy:  [x]  Electrolytes WNL []  K >=4.0 and Mg >=2.0  PLAN: Mag 1.7; will order magnesium sulfate 2 gm IV x 1  Phos 2.0; will order potassium phosphate 15 mmol IV x 1  Check electrolytes (renal function panel and magnesium) with next AM labs  Thank you for allowing pharmacy to be a part of this patient's care.  Littie Deeds, PharmD Pharmacy Resident  03/02/2023 7:17 AM

## 2023-03-02 NOTE — Progress Notes (Signed)
Pt's wife and sister in law updated at bedside on plan of care.  All questions answered, they are appreciative of update.      Harlon Ditty, AGACNP-BC [facility] Prefer epic messenger for cross cover needs If after hours, please call [facility]

## 2023-03-02 NOTE — Progress Notes (Signed)
NAME:  [name] [name], MRN:  [id number], DOB:  1940/08/03, LOS: 1 ADMISSION DATE:  03/01/2023  CHIEF COMPLAINT:  Hypotension, AMS  BRIEF SYNOPSIS 82 year old male presents with septic shock due to UTI on admission   History of Present Illness:  [name] [name] is an 82 year old male who was brought to the ED due to low BP reading and lethargy at home, wife reports that SBP was in the 70s. Patient BP readings are typically in the 110's. Upon arrival the patient was bradycardic, hypothermic and hypotensive.   Wife reports that the patient has minimal fluid intake on a regular basis, a history of malnutrition and hypoalbuminemia secondary to poor protein intake.  ED course:  Code sepsis activated in ED and blood cultures collected. -On Lawyer -IVF given(1L) -Started on Levophed gtt -Given 1 dose Ceftriaxone  Acute kidney injury LA 1.2  PCCM consulted to admit for sepsis Patient unresponsive/comatose and  ill appearing Wife at bedside  Pertinent  Medical History  A-fib Diabetes Bilateral AKA CHF Hx of pancreatitis Hypertension Peripheral Artery Disease Anemia of Chronic Disease Cerebral vascular accident  GERD  Significant Hospital Events: Including procedures, antibiotic start and stop dates in addition to other pertinent events   11/7: Patient started on low dose Levo for hypotension with septic shock, given ceftriaxone in ED. Patient admitted to ICU. 03/02/23- patient awake speaking in full sentences with mild encephalopathy.  He is adentulous and has bilateral amputation so he requires SLP today.  His shock physiology has improved and he is not on vasopressor support this morning.   Micro Data:  Blood cultures >> pending 11/7: Sars-COVID >> negative  Antimicrobials:   Antibiotics Given (last 72 hours)     Date/Time Action Medication Dose Rate   03/01/23 1322 New Bag/Given   cefTRIAXone (ROCEPHIN) 2 g in sodium chloride 0.9 % 100 mL IVPB 2 g 200 mL/hr          Interim History / Subjective:  Upon arrival to the ED, patient developed worsening hypotension with altered mental status and hypothermia requiring the bair hugger. Started on levophed for hemodynamic stability    Objective   Blood pressure 125/78, pulse 73, temperature 97.7 F (36.5 C), temperature source Oral, resp. rate 14, height 6\' 2"  (1.88 m), weight 67.5 kg, SpO2 100%.        Intake/Output Summary (Last 24 hours) at 03/02/2023 4098 Last data filed at 03/02/2023 0800 Gross per 24 hour  Intake 7590.34 ml  Output 1800 ml  Net 5790.34 ml   Filed Weights   03/01/23 1545 03/02/23 0500  Weight: 66.7 kg 67.5 kg     REVIEW OF SYSTEMS UNABLE TO PROVIDE ROS DUE TO CRITICAL ILLNESS  PHYSICAL EXAMINATION:  GENERAL:critically ill appearing EYES: Pupils equal, round, reactive to light.  No scleral icterus.  MOUTH: Moist mucosal membrane.  NECK: Supple.  PULMONARY: Lungs clear to auscultation,  CARDIOVASCULAR: S1 and S2.  Bradycardic rate and Irregular rhythm GASTROINTESTINAL: Soft, nontender, -distended. Positive bowel sounds.  MUSCULOSKELETAL: b/l AKA NEUROLOGIC: Able to follow simple commands, non-purposeful movements  SKIN: Intact, no apparent wounds visible, delayed capillary refill  Pulses present bilaterally    ASSESSMENT AND PLAN SYNOPSIS  82 yo AAM with multiple medical issues admitted for acute severe septic /hypovolumic shock with hypothermia and UTI associated with acute renal failure leading to acute metabolic encephalopathy, high risk for aspiration and high risk for cardiac arrest   SEPTIC/Hypovolemic  shock SOURCE-UTI- Present on admission  -use vasopressors  to keep MAP>65 as needed -follow ABG and LA as needed -follow up cultures -emperic ABX -consider stress dose steroids -s/p aggressive IV fluid Resuscitation -s/p IV antibiotics now transitioning to PO cipro    CARDIAC Paroxysmal Atrial fibrillation  Chronic HFpEF without acute  decompensation - ICU cardiac monitoring - Rate control if indicated - Holding Coreg for now - MAP goal >65 - Trending troponin - Hold anticoagulation until CT head completed  - CXR unremarkable  - Follow ABG and LA - PCT WNL - Pending blood cultures   NEUROLOGY Acute metabolic encephalopathy - Head CT ordered to r/o head bleed prior to initiating AC - Sedation as needed, not currently required  RENAL ACUTE KIDNEY INJURY likely secondary to hypovolemia/sepsis - Daily BMP - Monitor electrolytes, replace as needed - Strict I&O - Trend renal function - UA ordered to assess if UTI could be a source - ABG fairly normal, will consider rechecking if mentation does not improve Place foley IVF's   Intake/Output Summary (Last 24 hours) at 03/02/2023 1610 Last data filed at 03/02/2023 0800 Gross per 24 hour  Intake 7590.34 ml  Output 1800 ml  Net 5790.34 ml    HEME Mild anemia of chronic disease  - Consider transfusion if HGB<7 - DVT PRX until head CT completed - Daily CBC - Monitor Hemoglobin trends - Coagulation panel  ENDO Type 2 Diabetes Mellitus Suspected Adrenal Insufficiency - TSH and cortisol ordered - Hypo/hyperglycemia protocol - SSI when indicated - A1C ordered  GI -Consider GI prophylaxis if indicated  NUTRITIONAL STATUS -Patient NPO   Best practice (right click and "Reselect all SmartList Selections" daily)  Diet: NPO GI prophylaxis: N/A Mobility:  bed rest  Code Status: DNR/DNI Disposition:ICU  Labs   CBC: Recent Labs  Lab 03/01/23 1239 03/02/23 0622  WBC 3.6* 3.7*  NEUTROABS 1.3*  --   HGB 11.8* 9.5*  HCT 34.1* 28.0*  MCV 83.0 84.3  PLT 110* 95*    Basic Metabolic Panel: Recent Labs  Lab 03/01/23 1239 03/02/23 0622  NA 135 141  K 4.2 3.7  CL 102 109  CO2 24 26  GLUCOSE 81 116*  BUN 15 11  CREATININE 1.48* 1.20  CALCIUM 9.0 8.6*  MG 2.2 1.7  PHOS  --  2.0*   GFR: Estimated Creatinine Clearance: 46.1 mL/min (by C-G  formula based on SCr of 1.2 mg/dL). Recent Labs  Lab 03/01/23 1239 03/01/23 1316 03/01/23 1624 03/01/23 1917 03/02/23 0622  PROCALCITON <0.10  --   --   --   --   WBC 3.6*  --   --   --  3.7*  LATICACIDVEN  --  1.2 1.8 1.1  --     Liver Function Tests: Recent Labs  Lab 03/01/23 1239 03/02/23 0622  AST 26  --   ALT 22  --   ALKPHOS 71  --   BILITOT 1.1  --   PROT 7.0  --   ALBUMIN 3.0* 2.5*   No results for input(s): "LIPASE", "AMYLASE" in the last 168 hours. No results for input(s): "AMMONIA" in the last 168 hours.  ABG    Component Value Date/Time   PHART 7.31 (L) 03/01/2023 1501   PCO2ART 47 03/01/2023 1501   PO2ART 61 (L) 03/01/2023 1501   HCO3 24.8 03/01/2023 1501   ACIDBASEDEF 2.9 (H) 03/01/2023 1501   O2SAT 96 03/01/2023 1501     Coagulation Profile: Recent Labs  Lab 03/01/23 1316  INR 1.3*    Cardiac Enzymes: No  results for input(s): "CKTOTAL", "CKMB", "CKMBINDEX", "TROPONINI" in the last 168 hours.  HbA1C: Hemoglobin A1C  Date/Time Value Ref Range Status  02/23/2012 04:44 AM 6.6 (H) 4.2 - 6.3 % Final    Comment:    The American Diabetes Association recommends that a primary goal of therapy should be <7% and that physicians should reevaluate the treatment regimen in patients with HbA1c values consistently >8%.    Hgb A1c MFr Bld  Date/Time Value Ref Range Status  10/26/2022 11:36 AM 5.1 4.8 - 5.6 % Final    Comment:    (NOTE) Pre diabetes:          5.7%-6.4%  Diabetes:              >6.4%  Glycemic control for   <7.0% adults with diabetes   08/10/2022 04:18 AM 5.5 4.8 - 5.6 % Final    Comment:    (NOTE) Pre diabetes:          5.7%-6.4%  Diabetes:              >6.4%  Glycemic control for   <7.0% adults with diabetes     CBG: Recent Labs  Lab 03/02/23 0201 03/02/23 0406 03/02/23 0530 03/02/23 0626 03/02/23 0733  GLUCAP 104* 58* 124* 104* 101*     Past Medical History:  He,  has a past medical history of CHF  (congestive heart failure) (HCC), Diabetes (HCC), Dysrhythmia, HTN (hypertension), Infection, and Pancreatitis.   Surgical History:   Past Surgical History:  Procedure Laterality Date   ABOVE KNEE LEG AMPUTATION Bilateral    AMPUTATION Right 03/09/2021   Procedure: AMPUTATION FOOT;  Surgeon: Annice Needy, MD;  Location: [facility];  Service: General;  Laterality: Right;   AMPUTATION Right 03/11/2021   Procedure: AMPUTATION ABOVE KNEE;  Surgeon: Annice Needy, MD;  Location: [facility];  Service: General;  Laterality: Right;   AMPUTATION Left 08/13/2022   Procedure: AMPUTATION ABOVE KNEE;  Surgeon: Bertram Denver, MD;  Location: [facility];  Service: Vascular;  Laterality: Left;   CATARACT EXTRACTION W/PHACO Right 03/08/2017   Procedure: CATARACT EXTRACTION PHACO AND INTRAOCULAR LENS PLACEMENT (IOC);  Surgeon: Nevada Crane, MD;  Location: [facility];  Service: Ophthalmology;  Laterality: Right;  Lot# [id number] H [place]: 00:30.9 AP%: 8.2 CDE: 2.54   CATARACT EXTRACTION W/PHACO Left 04/04/2017   Procedure: CATARACT EXTRACTION PHACO AND INTRAOCULAR LENS PLACEMENT (IOC);  Surgeon: Nevada Crane, MD;  Location: [facility];  Service: Ophthalmology;  Laterality: Left;  [place] 00:31.4 AP% 12.0 CDE 3.78 Fluid Pack lot # [id number]   CHOLECYSTECTOMY     EYE SURGERY     right eye   LOWER EXTREMITY ANGIOGRAPHY Right 02/10/2021   Procedure: LOWER EXTREMITY ANGIOGRAPHY;  Surgeon: Annice Needy, MD;  Location: [facility];  Service: Cardiovascular;  Laterality: Right;   LOWER EXTREMITY ANGIOGRAPHY Left 07/03/2022   Procedure: Lower Extremity Angiography;  Surgeon: Annice Needy, MD;  Location: [facility];  Service: Cardiovascular;  Laterality: Left;   LOWER EXTREMITY ANGIOGRAPHY Left 07/24/2022   Procedure: Lower Extremity Angiography;  Surgeon: Annice Needy, MD;  Location: [facility];  Service: Cardiovascular;  Laterality: Left;   LOWER EXTREMITY ANGIOGRAPHY Left 08/11/2022    Procedure: Lower Extremity Angiography;  Surgeon: Annice Needy, MD;  Location: [facility];  Service: Cardiovascular;  Laterality: Left;     Social History:   reports that he has never smoked. His smokeless tobacco use includes  chew. He reports that he does not drink alcohol and does not use drugs.   Family History:  His family history is not on file.   Allergies No Known Allergies   Home Medications  Prior to Admission medications   Medication Sig Start Date End Date Taking? Authorizing Provider  ascorbic acid (VITAMIN C) 500 MG tablet Take 500 mg by mouth daily. 10/06/21   [provider]  aspirin EC 81 MG tablet Take 1 tablet (81 mg total) by mouth daily. Swallow whole. 07/03/22 07/03/23  Annice Needy, MD  atorvastatin (LIPITOR) 10 MG tablet Take 1 tablet (10 mg total) by mouth daily. 02/10/21 01/10/23  Annice Needy, MD  carvedilol (COREG) 6.25 MG tablet Take 1 tablet (6.25 mg total) by mouth 2 (two) times daily. HOLD THIS MEDICATION UNTIL PT SEES CARDIO SECONDARY TO BRADYCARDIA 11/02/22   Charise Killian, MD  ferrous sulfate 325 (65 FE) MG tablet Take 325 mg by mouth daily with breakfast. 05/11/21 01/10/23  [provider]  furosemide (LASIX) 20 MG tablet Take 20 mg daily by mouth. 12/19/16   [provider]  gabapentin (NEURONTIN) 100 MG capsule Take 1 capsule (100 mg total) by mouth 3 (three) times daily. 03/16/21 01/10/23  Charise Killian, MD  lisinopril (PRINIVIL,ZESTRIL) 10 MG tablet Take 15 mg by mouth daily.    [provider]  vitamin B-12 (CYANOCOBALAMIN) 1000 MCG tablet Take 1,000 mcg by mouth daily.    [provider]       DVT/GI PRX  assessed I Assessed the need for Labs I Assessed the need for Foley I Assessed the need for Central Venous Line Family Discussion when available I Assessed the need for Mobilization I made an Assessment of medications to be adjusted accordingly Safety Risk assessment  completed  CASE DISCUSSED IN MULTIDISCIPLINARY ROUNDS WITH ICU TEAM   Critical care provider statement:   Total critical care time: 33 minutes   Performed by: Karna Christmas MD   Critical care time was exclusive of separately billable procedures and treating other patients.   Critical care was necessary to treat or prevent imminent or life-threatening deterioration.   Critical care was time spent personally by me on the following activities: development of treatment plan with patient and/or surrogate as well as nursing, discussions with consultants, evaluation of patient's response to treatment, examination of patient, obtaining history from patient or surrogate, ordering and performing treatments and interventions, ordering and review of laboratory studies, ordering and review of radiographic studies, pulse oximetry and re-evaluation of patient's condition.    Vida Rigger, M.D.  Pulmonary & Critical Care Medicine

## 2023-03-02 NOTE — Evaluation (Signed)
Physical Therapy Evaluation Patient Details Name: [name] [name] MRN: [id number] DOB: 09-19-1940 Today's Date: 03/02/2023  History of Present Illness  Pt is a 81 y.o.male that presented to the ED for low BP and lethargy, admitted for acute severe septic /hypovolumic shock with hypothermia and UTI associated with acute renal failure leading to acute metabolic encephalopathy with medical history significant for R AKA, L AKA (on 08/11/22) CHF, type 2 diabetes mellitus, PAD, hypertension and pancreatitis.   Clinical Impression  Pt alert, oriented to self, able to provide some PLOF but did display decreased safety awareness/awareness of deficits during session. Per pt he is requiring assistance for all mobility (AP transfers to WC/BSC, balance activities with PT) including ADLs. He was able to perform supine <> sit min-modA several times during session, did spontaneously return to supine at least once, unclear if he lost his balance. Able to scoot posteriorly on bed but required safety assistance/hands on throughout. Pt also rolling L/R during session, minA and assist for hand placement/repositioning.  Overall the patient demonstrated deficits (see "PT Problem List") that impede the patient's functional abilities, safety, and mobility and would benefit from skilled PT intervention.         If plan is discharge home, recommend the following: Two people to help with walking and/or transfers;A lot of help with bathing/dressing/bathroom;Assistance with feeding;Assistance with cooking/housework;Assist for transportation;Help with stairs or ramp for entrance;Supervision due to cognitive status   Can travel by private vehicle        Equipment Recommendations None recommended by PT  Recommendations for Other Services       Functional Status Assessment Patient has had a recent decline in their functional status and/or demonstrates limited ability to make significant improvements in function in a reasonable  and predictable amount of time     Precautions / Restrictions Precautions Precautions: Fall Restrictions Weight Bearing Restrictions: No      Mobility  Bed Mobility Overal bed mobility: Needs Assistance Bed Mobility: Supine to Sit, Sit to Supine     Supine to sit: Min assist, Mod assist, +2 for safety/equipment Sit to supine: Min assist, Mod assist, +2 for safety/equipment   General bed mobility comments: depending on level of fatigue throughout session    Transfers                        Ambulation/Gait                  Stairs            Wheelchair Mobility     Tilt Bed    Modified Rankin (Stroke Patients Only)       Balance Overall balance assessment: Needs assistance Sitting-balance support: Bilateral upper extremity supported Sitting balance-Leahy Scale: Fair                                       Pertinent Vitals/Pain Pain Assessment Pain Assessment: Faces Faces Pain Scale: Hurts a little bit Pain Location: scrotum and buttocks Pain Descriptors / Indicators: Discomfort Pain Intervention(s): Monitored during session, Repositioned    Home Living Family/patient expects to be discharged to:: Private residence Living Arrangements: Spouse/significant other;Children Available Help at Discharge: Family;Available 24 hours/day Type of Home: House Home Access: Ramped entrance       Home Layout: One level Home Equipment: Agricultural consultant (2 wheels);BSC/3in1;Hospital bed;Wheelchair - power;Wheelchair - manual  Prior Function               Mobility Comments: Pt reports AP/PA transfer from hospital bed <>power chair and has been doing HHPT. ADLs Comments: Pt reports having PCA recently stop coming. Pt does bathing from bed level and transfers AP/PA from bed <>wheelchair and from bed <>commode chair. Assist with self care tasks.     Extremity/Trunk Assessment   Upper Extremity Assessment Upper Extremity  Assessment: Generalized weakness    Lower Extremity Assessment Lower Extremity Assessment:  (BLE AKA)       Communication   Communication Communication: No apparent difficulties Cueing Techniques: Verbal cues;Tactile cues  Cognition Arousal: Alert Behavior During Therapy: WFL for tasks assessed/performed Overall Cognitive Status: No family/caregiver present to determine baseline cognitive functioning                                 General Comments: Pt follows 1 step commands inconsistently but is pleasant and cooperative throughout session.        General Comments      Exercises     Assessment/Plan    PT Assessment Patient needs continued PT services  PT Problem List Decreased activity tolerance;Decreased balance;Decreased mobility       PT Treatment Interventions DME instruction;Neuromuscular re-education;Gait training;Stair training;Patient/family education;Functional mobility training;Therapeutic activities;Therapeutic exercise;Balance training    PT Goals (Current goals can be found in the Care Plan section)  Acute Rehab PT Goals Patient Stated Goal: to get better PT Goal Formulation: With patient Time For Goal Achievement: 03/16/23 Potential to Achieve Goals: Fair    Frequency Min 1X/week     Co-evaluation PT/OT/SLP Co-Evaluation/Treatment: Yes Reason for Co-Treatment: Complexity of the patient's impairments (multi-system involvement);For patient/therapist safety;To address functional/ADL transfers PT goals addressed during session: Mobility/safety with mobility OT goals addressed during session: ADL's and self-care       AM-PAC PT "6 Clicks" Mobility  Outcome Measure Help needed turning from your back to your side while in a flat bed without using bedrails?: A Lot Help needed moving from lying on your back to sitting on the side of a flat bed without using bedrails?: A Lot Help needed moving to and from a bed to a chair (including a  wheelchair)?: A Lot Help needed standing up from a chair using your arms (e.g., wheelchair or bedside chair)?: Total Help needed to walk in hospital room?: Total Help needed climbing 3-5 steps with a railing? : Total 6 Click Score: 9    End of Session   Activity Tolerance: Patient tolerated treatment well;Patient limited by fatigue Patient left: in bed;with call bell/phone within reach;with bed alarm set Nurse Communication: Mobility status PT Visit Diagnosis: Other abnormalities of gait and mobility (R26.89);Difficulty in walking, not elsewhere classified (R26.2);Muscle weakness (generalized) (M62.81)    Time: 6962-9528 PT Time Calculation (min) (ACUTE ONLY): 23 min   Charges:   PT Evaluation $PT Eval Moderate Complexity: 1 Mod PT Treatments $Therapeutic Activity: 8-22 mins PT General Charges $$ ACUTE PT VISIT: 1 Visit        Olga Coaster PT, DPT 3:48 PM,03/02/23

## 2023-03-02 NOTE — Progress Notes (Signed)
Initial Nutrition Assessment  DOCUMENTATION CODES:   Underweight  INTERVENTION:   Ensure Enlive po TID, each supplement provides 350 kcal and 20 grams of protein.  Magic cup TID with meals, each supplement provides 290 kcal and 9 grams of protein  MVI po daily   Vitamin C 500mg  po BID  Pt at high refeed risk; recommend monitor potassium, magnesium and phosphorus labs daily until stable  Daily weights  NUTRITION DIAGNOSIS:   Unintentional weight loss related to chronic illness as evidenced by 24 percent weight loss in 7 months.  GOAL:   Patient will meet greater than or equal to 90% of their needs  MONITOR:   PO intake, Supplement acceptance, Labs, Weight trends, Skin, I & O's  REASON FOR ASSESSMENT:   Malnutrition Screening Tool    ASSESSMENT:   82 y/o male with h/o asthma, Afib, CHF, DM, GERD, HTN, HLD, PAD, CVA, pancreatitis and bilateral AKAs who is admitted with UTI, AKI and sepsis.  RD working remotely.  Pt is well known to this RD from previous admissions. Pt with chronic malnutrition. Pt generally with fairly good appetite and oral intake in hospital. Pt is documented to have eaten 75% of his breakfast this morning. Pt drinks Boost supplements at home. Per chart, pt appears to be down 48lbs(24%) over the past 7 months; this is severe weight loss. Per chart, pt with poor oral intake pta. RD will add supplements and vitamins to help pt meet his estimated needs and to support wound healing. Pt is currently refeeding; electrolytes being monitored and supplemented. RD suspects pt with ongoing malnutrition. RD will obtain history and exam at follow up.   Medications reviewed and include: ciprofloxacin, KPhos   Labs reviewed: K 3.7 wnl, P 2.0(L), Mg 1.7 wnl Wbc- 3.7(L), Hgb 9.5(L), Hct 28.0(L) Cbgs- 200, 101, 104 x 24 hrs  AIC 5.1- 7/4  NUTRITION - FOCUSED PHYSICAL EXAM: Unable to perform at this time   Diet Order:   Diet Order             DIET DYS 3 Room  service appropriate? Yes; Fluid consistency: Thin  Diet effective now                  EDUCATION NEEDS:   No education needs have been identified at this time  Skin:  Skin Assessment: Reviewed RN Assessment (Stage II sacrum)  Last BM:  11/8- type 5  Height:   Ht Readings from Last 1 Encounters:  03/01/23 6\' 2"  (1.88 m)    Weight:   Wt Readings from Last 1 Encounters:  03/02/23 67.5 kg    Ideal Body Weight:  86.3 kg  BMI:  Body mass index is 19.11 kg/m.  Estimated Nutritional Needs:   Kcal:  1900-2200kcal/day  Protein:  95-110g/day  Fluid:  1.7-2.0L/day  Betsey Holiday MS, RD, LDN Please refer to [facility] for RD and/or RD on-call/weekend/after hours pager

## 2023-03-02 NOTE — Evaluation (Signed)
Occupational Therapy Evaluation Patient Details Name: [name] [name] MRN: [id number] DOB: August 08, 1940 Today's Date: 03/02/2023   History of Present Illness [name] is an 82 year old male who was brought to the ED due to low BP reading and lethargy at home, wife reports that SBP was in the 70s. Patient BP readings are typically in the 110's. Upon arrival the patient was bradycardic, hypothermic and hypotensive.   Clinical Impression   Patient presenting with decreased Ind in self care,balance, functional mobility/transfers, endurance, and safety awareness.  Patient lives at home with family who assist with functional mobility and self care as needed. Pt reports self care from bed level with him performing UB self care and LB assist. Pt transfers to [facility] and power chair with AP/PA transfer and assistance. Pt needs assistance for clothing management and hygiene with toileting needs. He is likely close to baseline. Pt needing min- mod A for AP movements from bed level but pt needing more mod A as he began to fatigue this session.Patient will benefit from acute OT to increase overall independence in the areas of ADLs, functional mobility, and safety awareness in order to safely discharge.      If plan is discharge home, recommend the following: A lot of help with walking and/or transfers;A lot of help with bathing/dressing/bathroom;Assistance with cooking/housework;Assist for transportation;Help with stairs or ramp for entrance;Direct supervision/assist for financial management;Direct supervision/assist for medications management    Functional Status Assessment  Patient has had a recent decline in their functional status and demonstrates the ability to make significant improvements in function in a reasonable and predictable amount of time.  Equipment Recommendations  None recommended by OT       Precautions / Restrictions Precautions Precautions: Fall      Mobility Bed Mobility Overal bed  mobility: Needs Assistance Bed Mobility: Supine to Sit, Sit to Supine     Supine to sit: Min assist, Mod assist Sit to supine: Min assist, Mod assist   General bed mobility comments: depending level of fatigue throughout session    Transfers                   General transfer comment: not attempted      Balance Overall balance assessment: Needs assistance Sitting-balance support: Bilateral upper extremity supported Sitting balance-Leahy Scale: Fair                                     ADL either performed or assessed with clinical judgement   ADL                                         General ADL Comments: likely close to baseline with min - mod A for simulated AP/PA movements performed on bed for safety.      Pertinent Vitals/Pain Pain Assessment Pain Assessment: Faces Faces Pain Scale: Hurts a little bit Pain Location: scrotum and buttocks Pain Descriptors / Indicators: Discomfort Pain Intervention(s): Monitored during session, Repositioned     Extremity/Trunk Assessment Upper Extremity Assessment Upper Extremity Assessment: Generalized weakness;Overall [facility] for tasks assessed           Communication Communication Communication: No apparent difficulties Cueing Techniques: Verbal cues;Tactile cues   Cognition Arousal: Alert Behavior During Therapy: WFL for tasks assessed/performed Overall Cognitive Status: No family/caregiver present to determine  baseline cognitive functioning                                 General Comments: Pt follows 1 step commands inconsistently but is pleasant and cooperative throughout session.                Home Living Family/patient expects to be discharged to:: Private residence Living Arrangements: Spouse/significant other;Children Available Help at Discharge: Family;Available 24 hours/day Type of Home: House Home Access: Ramped entrance     Home Layout: One  level     Bathroom Shower/Tub: Sponge bathes at baseline   Bathroom Toilet: Standard     Home Equipment: Agricultural consultant (2 wheels);BSC/3in1;Hospital bed;Wheelchair - power;Wheelchair - manual          Prior Functioning/Environment               Mobility Comments: Pt reports AP/PA transfer from hospital bed <>power chair and has been doing HHPT. ADLs Comments: Pt reports having PCA recently stop coming. Pt does bathing from bed level and transfers AP/PA from bed <>wheelchair and from bed <>commode chair. Assist with self care tasks.        OT Problem List: Decreased strength;Decreased activity tolerance;Decreased safety awareness;Impaired balance (sitting and/or standing);Decreased knowledge of use of DME or AE      OT Treatment/Interventions: Self-care/ADL training;Therapeutic exercise;Therapeutic activities;Energy conservation;DME and/or AE instruction;Patient/family education;Balance training    OT Goals(Current goals can be found in the care plan section) Acute Rehab OT Goals Patient Stated Goal: to go home OT Goal Formulation: With patient Time For Goal Achievement: 03/16/23 Potential to Achieve Goals: Fair ADL Goals Pt Will Perform Lower Body Dressing: with min assist;sitting/lateral leans Pt Will Transfer to Toilet: with min assist;anterior/posterior transfer Pt Will Perform Toileting - Clothing Manipulation and hygiene: with min assist;sit to/from stand Pt/caregiver will Perform Home Exercise Program: Both right and left upper extremity;With theraband;Increased strength;With written HEP provided;Independently  OT Frequency: Min 1X/week    Co-evaluation PT/OT/SLP Co-Evaluation/Treatment: Yes Reason for Co-Treatment: Complexity of the patient's impairments (multi-system involvement);For patient/therapist safety;To address functional/ADL transfers PT goals addressed during session: Mobility/safety with mobility OT goals addressed during session: ADL's and  self-care      AM-PAC OT "6 Clicks" Daily Activity     Outcome Measure Help from another person eating meals?: None Help from another person taking care of personal grooming?: None Help from another person toileting, which includes using toliet, bedpan, or urinal?: A Lot Help from another person bathing (including washing, rinsing, drying)?: A Lot Help from another person to put on and taking off regular upper body clothing?: A Little Help from another person to put on and taking off regular lower body clothing?: A Lot 6 Click Score: 17   End of Session Nurse Communication: Mobility status  Activity Tolerance: Patient tolerated treatment well Patient left: in bed;with call bell/phone within reach;with bed alarm set  OT Visit Diagnosis: Muscle weakness (generalized) (M62.81)                Time: 6578-4696 OT Time Calculation (min): 27 min Charges:  OT General Charges $OT Visit: 1 Visit OT Evaluation $OT Eval Moderate Complexity: 1 [id number], [place], OTR/L , CBIS ascom [phone]  03/02/23, 3:02 PM

## 2023-03-03 DIAGNOSIS — L8990 Pressure ulcer of unspecified site, unspecified stage: Secondary | ICD-10-CM | POA: Insufficient documentation

## 2023-03-03 DIAGNOSIS — I629 Nontraumatic intracranial hemorrhage, unspecified: Secondary | ICD-10-CM | POA: Diagnosis not present

## 2023-03-03 DIAGNOSIS — R579 Shock, unspecified: Secondary | ICD-10-CM | POA: Diagnosis not present

## 2023-03-03 LAB — RENAL FUNCTION PANEL
Albumin: 2.7 g/dL — ABNORMAL LOW (ref 3.5–5.0)
Anion gap: 9 (ref 5–15)
BUN: 15 mg/dL (ref 8–23)
CO2: 23 mmol/L (ref 22–32)
Calcium: 9 mg/dL (ref 8.9–10.3)
Chloride: 111 mmol/L (ref 98–111)
Creatinine, Ser: 1.16 mg/dL (ref 0.61–1.24)
GFR, Estimated: >60 mL/min (ref >60)
Glucose, Bld: 89 mg/dL (ref 70–99)
Phosphorus: 2.1 mg/dL — ABNORMAL LOW (ref 2.5–4.6)
Potassium: 3.5 mmol/L (ref 3.5–5.1)
Sodium: 143 mmol/L (ref 135–145)

## 2023-03-03 LAB — CBC
HCT: 26.1 % — ABNORMAL LOW (ref 39.0–52.0)
Hemoglobin: 9.1 g/dL — ABNORMAL LOW (ref 13.0–17.0)
MCH: 28.2 pg (ref 26.0–34.0)
MCHC: 34.9 g/dL (ref 30.0–36.0)
MCV: 80.8 fL (ref 80.0–100.0)
Platelets: 107 10*3/uL — ABNORMAL LOW (ref 150–400)
RBC: 3.23 MIL/uL — ABNORMAL LOW (ref 4.22–5.81)
RDW: 15 % (ref 11.5–15.5)
WBC: 8.2 10*3/uL (ref 4.0–10.5)
nRBC: 0 % (ref 0.0–0.2)

## 2023-03-03 LAB — GLUCOSE, CAPILLARY
Glucose-Capillary: 92 mg/dL (ref 70–99)
Glucose-Capillary: 103 mg/dL — ABNORMAL HIGH (ref 70–99)
Glucose-Capillary: 63 mg/dL — ABNORMAL LOW (ref 70–99)
Glucose-Capillary: 111 mg/dL — ABNORMAL HIGH (ref 70–99)
Glucose-Capillary: 132 mg/dL — ABNORMAL HIGH (ref 70–99)
Glucose-Capillary: 86 mg/dL (ref 70–99)

## 2023-03-03 LAB — PROCALCITONIN: Procalcitonin: <0.1 ng/mL

## 2023-03-03 LAB — MAGNESIUM: Magnesium: 2.2 mg/dL (ref 1.7–2.4)

## 2023-03-03 MED ORDER — POTASSIUM PHOSPHATES 15 MMOLE/5ML IV SOLN
30.0000 mmol | Freq: Once | INTRAVENOUS | Status: AC
  Administered 2023-03-03: 30 mmol via INTRAVENOUS
  Filled 2023-03-03: qty 10

## 2023-03-03 MED ORDER — CARVEDILOL 6.25 MG PO TABS
6.2500 mg | ORAL_TABLET | Freq: Two times a day (BID) | ORAL | Status: DC
  Administered 2023-03-03 – 2023-03-04 (×4): 6.25 mg via ORAL
  Filled 2023-03-03 (×4): qty 1

## 2023-03-03 MED ORDER — MIRTAZAPINE 15 MG PO TABS
7.5000 mg | ORAL_TABLET | Freq: Every day | ORAL | Status: DC
  Administered 2023-03-03 – 2023-03-05 (×3): 7.5 mg via ORAL
  Filled 2023-03-03 (×3): qty 1

## 2023-03-03 MED ORDER — HYDRALAZINE HCL 20 MG/ML IJ SOLN: 10.0000 mg | Freq: Four times a day (QID) | INTRAMUSCULAR | Status: DC | PRN

## 2023-03-03 MED ORDER — ACETAMINOPHEN 325 MG PO TABS: 650.0000 mg | ORAL_TABLET | Freq: Four times a day (QID) | ORAL | Status: DC | PRN

## 2023-03-03 MED ORDER — LISINOPRIL 5 MG PO TABS
15.0000 mg | ORAL_TABLET | Freq: Every day | ORAL | Status: DC
  Administered 2023-03-03 – 2023-03-04 (×2): 15 mg via ORAL
  Filled 2023-03-03: qty 1
  Filled 2023-03-03: qty 3

## 2023-03-03 MED ORDER — ATORVASTATIN CALCIUM 20 MG PO TABS
10.0000 mg | ORAL_TABLET | Freq: Every day | ORAL | Status: DC
  Administered 2023-03-03 – 2023-03-05 (×3): 10 mg via ORAL
  Filled 2023-03-03 (×3): qty 1

## 2023-03-03 MED ORDER — GABAPENTIN 100 MG PO CAPS
100.0000 mg | ORAL_CAPSULE | Freq: Three times a day (TID) | ORAL | Status: DC
  Administered 2023-03-03 – 2023-03-06 (×9): 100 mg via ORAL
  Filled 2023-03-03 (×9): qty 1

## 2023-03-03 MED ORDER — ONDANSETRON HCL 4 MG/2ML IJ SOLN: 4.0000 mg | Freq: Four times a day (QID) | INTRAMUSCULAR | Status: DC | PRN

## 2023-03-03 NOTE — TOC Initial Note (Signed)
Transition of Care [facility]) - Initial/Assessment Note    Patient Details  Name: [name] [name] MRN: [id number] Date of Birth: 25-Sep-1940  Transition of Care [facility]) CM/SW Contact:    Kemper Durie, RN Phone Number: 03/03/2023, 3:34 PM  Clinical Narrative:                  Met with patient and wife at bedside, lives at home with wife, state patient was active with [facility] prior to admission.  PCP is Laren Everts, NP, medications are obtained from [facility].  Confirmed with Kandee Keen that patient was active, will resume services once patient is discharged back home.   Expected Discharge Plan: Home w Home Health Services Barriers to Discharge: Continued Medical Work up   Patient Goals and CMS Choice Patient states their goals for this hospitalization and ongoing recovery are:: Home with [facility] CMS Medicare.gov Compare Post Acute Care list provided to:: Patient Represenative (must comment) Choice offered to / list presented to : Spouse      Expected Discharge Plan and Services     Post Acute Care Choice: Home Health Living arrangements for the past 2 months: Single Family Home                           HH Arranged: PT, OT HH Agency: [facility] Date [facility] Agency Contacted: 03/03/23 Time HH Agency Contacted: 1534 Representative spoke with at [facility] Agency: Kandee Keen  Prior Living Arrangements/Services Living arrangements for the past 2 months: Single Family Home Lives with:: Spouse Patient language and need for interpreter reviewed:: Yes Do you feel safe going back to the place where you live?: Yes      Need for Family Participation in Patient Care: Yes (Comment) Care giver support system in place?: Yes (comment) Current home services: DME, Home OT, Home PT Criminal Activity/Legal Involvement Pertinent to Current Situation/Hospitalization: No - Comment as needed  Activities of Daily Living   ADL Screening (condition at time of admission) Independently performs ADLs?:  No Is the patient deaf or have difficulty hearing?: Yes Does the patient have difficulty seeing, even when wearing glasses/contacts?: No Does the patient have difficulty concentrating, remembering, or making decisions?: Yes  Permission Sought/Granted   Permission granted to share information with : Yes, Verbal Permission Granted              Emotional Assessment Appearance:: Appears stated age            Admission diagnosis:  Shock (HCC) [R57.9] Patient Active Problem List   Diagnosis Date Noted   Intracranial hemorrhage (HCC) 03/03/2023   Pressure injury of skin 03/03/2023   Shock (HCC) 03/01/2023   Hematuria 11/20/2022   Goals of care, counseling/discussion 11/20/2022   Spinal stenosis in cervical region 10/27/2022   Spondylosis, cervical, 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   CVA (cerebral vascular accident) (HCC) 10/26/2022   Sepsis due to undetermined organism (HCC) 08/10/2022   Cellulitis of second toe of left foot 08/10/2022   Generalized weakness 08/10/2022   Sepsis (HCC) 08/10/2022   Gangrene (HCC) 08/10/2022   AKI (acute kidney injury) (HCC) 08/10/2022   Hx of AKA (above knee amputation), 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   Acute blood loss anemia    Anemia of chronic disease    Leukocytosis    Chronic diastolic CHF (congestive heart failure) (HCC)    Moderate malnutrition (HCC) 03/11/2021   Acute osteomyelitis of right ankle (HCC) 03/08/2021   Open wound of right ankle  03/08/2021   Lactic acidosis 03/08/2021   Hypoalbuminemia due to protein-calorie malnutrition (HCC) 03/08/2021   Hyperglycemia due to diabetes mellitus (HCC) 03/08/2021   Hyponatremia 03/08/2021   Mixed hyperlipidemia 03/08/2021   Peripheral arterial disease (HCC) 03/08/2021   CHF (congestive heart failure) (HCC) 02/02/2021   Type 2 diabetes mellitus with peripheral neuropathy (HCC) 02/02/2021   Right leg weakness 11/19/2019   Vitamin B12 deficiency 05/20/2018   Vitamin D deficiency 05/20/2018    Cardiomyopathy (HCC) 07/29/2013   Asthma 02/19/2012   Essential hypertension 02/19/2012   Atrial fibrillation (HCC) 10/27/2011   GERD (gastroesophageal reflux disease) 10/27/2011   Dyslipidemia 10/27/2011   PCP:  Luciana Axe, NP Pharmacy:   [facility] - [place], [place] - [address] [place] [address] Phone: [phone] Fax: [phone]     Social Determinants of Health (SDOH) Social History: SDOH Screenings   Food Insecurity: No Food Insecurity (03/01/2023)  Housing: Patient Declined (03/01/2023)  Transportation Needs: No Transportation Needs (03/01/2023)  Utilities: Not At Risk (03/01/2023)  Tobacco Use: High Risk (03/01/2023)   SDOH Interventions:     Readmission Risk Interventions     No data to display

## 2023-03-03 NOTE — Progress Notes (Signed)
[facility] Progress Note  Patient: [name] [name]    VWU:[id number]  DOA: 03/01/2023     Date of Service: the patient was seen and examined on 03/03/2023  Chief Complaint  Patient presents with   Hypotension   Brief hospital course: 82 year old male with PMH of A-fib, HTN, CVA, PAD s/p bilateral AKA, history of pancreatitis, anemia of chronic disease, GERD, as reviewed from EMR, presented at [facility] ED with low BP reading and lethargy at home, wife reported that SBP was in the 70s. Patient BP readings are typically in the 110's. Upon arrival the patient was bradycardic, hypothermic and hypotensive.  [name] reported that the patient has minimal fluid intake on a regular basis, a history of malnutrition and hypoalbuminemia secondary to poor protein intake.  ED course:  Code sepsis activated in ED and blood cultures collected. -On Lawyer, IVF given(1L), Started on Levophed gtt -Given 1 dose Ceftriaxone, Acute kidney injury, LA 1.2 PCCM consulted to admit for sepsis Patient unresponsive/comatose and  ill appearing, Wife was at bedside  CT Head: Increased size of a 10 mm hyperdense lesion in the high posterior left frontal lobe, indeterminate but could reflect interval hemorrhage within a cavernoma or a small tumor/metastasis. No associated edema. Brain MRI without and with contrast could further evaluate. 2. Expected interval evolution of a now chronic right MCA infarct.  MRI Brain: Confirmed small acute on chronic intra-axial hemorrhage at the left middle or superior frontal gyrus, corresponding to the CT finding. No mass effect or complicating features. Favor acute on chronic small vessel disease, as some pre-existing hemosiderin but no convincing cavernoma there on July MRI. 2. No other acute finding. Underlying advanced ischemic disease. Progressed posterior right MCA and right MCA/PCA watershed territory encephalomalacia and hemosiderin since July. Underlying chronic  vertebrobasilar atherosclerosis and stenosis.  11/9 patient was stabilized in the ICU, Levophed weaned off, patient was transferred to [facility] service today  Assessment and Plan:  # Sepsis due to UTI and hypovolemic shock S/p IV fluid and Levophed given in the ED and admitted to the ICU. Vital signs improved, Levophed weaned off.  BP stable, elevated now S/p Florinef, which has been discontinued due to elevated blood pressure S/p ceftriaxone, transition to ciprofloxacin to 50 mg p.o. twice daily Blood culture NGTD Urine culture 80 K GPC, follow complete report Monitor vital signs  # Intracerebral hemorrhage CT and MRI brain reports reviewed as above Seen by neurosurgery, recommended repeat CT head in 2 to 4 weeks, hold Coumadin for 2 to 4 weeks.  No surgical intervention at this time   # Hypophosphatemia due to nutritional deficiency.  Phos repleted. Monitor electrolytes and replete as needed.  # AKI due to hypovolemic shock Status post IV fluid and Levophed, renal functions improved Monitor BMP daily and check urine output  # A-fib, HTN, PAD s/p B/L AKA, CVA Hold Coumadin for 2 to 4 weeks and resume when cleared by neurosurgery after repeating CT head in 2 to 4 weeks Resumed Coreg 6.25 mg p.o. twice daily, lisinopril 15 mg p.o. daily home dose Resumed Lipitor 10 mg p.o. daily Hold the Lasix for now, patient is euvolemic   # Anemia of chronic disease, hemoglobin 9.1 stable Monitor H&H # Thrombocytopenia could be secondary to sepsis, monitor platelet count daily.   Body mass index is 19.3 kg/m.  Nutrition Problem: Unintentional weight loss Etiology: chronic illness Interventions:  Pressure Injury 03/01/23 Sacrum Mid;Lower Stage 2 -  Partial thickness loss of dermis presenting as  a shallow open injury with a red, pink wound bed without slough. 2mm X 2mm (Active)  03/01/23 1615  Location: Sacrum  Location Orientation: Mid;Lower  Staging: Stage 2 -  Partial thickness loss of  dermis presenting as a shallow open injury with a red, pink wound bed without slough.  Wound Description (Comments): 2mm X 2mm  Present on Admission: Yes  Dressing Type Foam - Lift dressing to assess site every shift 03/03/23 1002     Diet: Dysphagia 3 diet with thin liquids DVT Prophylaxis: Subcutaneous Heparin    Advance goals of care discussion: DNR-limited  Family Communication: family was not present at bedside, at the time of interview.  The pt provided permission to discuss medical plan with the family. Opportunity was given to ask question and all questions were answered satisfactorily.   Disposition:  Pt is from home, admitted with sepsis, UTI, s/p Levophed ICU stay, downgraded on 11/9 under [facility] service, which precludes a safe discharge. Discharge to home, when stable, may need 1-2 more days to improve.  Follow urine culture  Subjective: No significant events overnight, patient was seen in the ICU, downgraded under [facility] service today.  Patient is AO x 1, pleasantly confused and redirectable.  Denies any complaints.  Resting comfortably.  Physical Exam: General: NAD, lying comfortably Appear in no distress, affect appropriate Eyes: PERRLA ENT: Oral Mucosa Clear, moist  Neck: no JVD,  Cardiovascular: S1 and S2 Present, no Murmur,  Respiratory: good respiratory effort, Bilateral Air entry equal and Decreased, no Crackles, no wheezes Abdomen: Bowel Sound present, Soft and no tenderness,  Skin: no rashes Extremities: B/L AKA, upper extremities no edema Neurologic: without any new focal findings Bedbound due to bilateral AKA  Vitals:   03/03/23 0700 03/03/23 0800 03/03/23 0900 03/03/23 1007  BP: (!) 158/85 (!) 177/149 (!) 164/98 (!) 158/94  Pulse: 94 74 93   Resp: 14 15  16   Temp: 98.2 F (36.8 C)   97.7 F (36.5 C)  TempSrc: Oral   Oral  SpO2: 100% 98% 100% 99%  Weight:      Height:        Intake/Output Summary (Last 24 hours) at 03/03/2023 1139 Last data filed at  03/03/2023 0900 Gross per 24 hour  Intake 899.09 ml  Output 1150 ml  Net -250.91 ml   Filed Weights   03/01/23 1545 03/02/23 0500 03/03/23 0408  Weight: 66.7 kg 67.5 kg 68.2 kg    Data Reviewed: I have personally reviewed and interpreted daily labs, tele strips, imagings as discussed above. I reviewed all nursing notes, pharmacy notes, vitals, pertinent old records I have discussed plan of care as described above with RN and patient/family.  CBC: Recent Labs  Lab 03/01/23 1239 03/02/23 0622 03/03/23 0325  WBC 3.6* 3.7* 8.2  NEUTROABS 1.3*  --   --   HGB 11.8* 9.5* 9.1*  HCT 34.1* 28.0* 26.1*  MCV 83.0 84.3 80.8  PLT 110* 95* 107*   Basic Metabolic Panel: Recent Labs  Lab 03/01/23 1239 03/02/23 0622 03/03/23 0325  NA 135 141 143  K 4.2 3.7 3.5  CL 102 109 111  CO2 24 26 23   GLUCOSE 81 116* 89  BUN 15 11 15   CREATININE 1.48* 1.20 1.16  CALCIUM 9.0 8.6* 9.0  MG 2.2 1.7 2.2  PHOS  --  2.0* 2.1*    Studies: No results found.  Scheduled Meds:  vitamin C  500 mg Oral BID   carvedilol  6.25 mg Oral BID  Chlorhexidine Gluconate Cloth  6 each Topical Daily   ciprofloxacin  250 mg Oral BID   feeding supplement  237 mL Oral TID BM   heparin injection (subcutaneous)  5,000 Units Subcutaneous Q8H   lisinopril  15 mg Oral Daily   multivitamin with minerals  1 tablet Oral Daily   Continuous Infusions:  potassium PHOSPHATE IVPB (in mmol) 30 mmol (03/03/23 0813)   PRN Meds: docusate sodium, hydrALAZINE, mouth rinse, polyethylene glycol  Time spent: 55 minutes  Author: Gillis Santa. MD [facility] 03/03/2023 11:39 AM  To reach On-call, see care teams to locate the attending and reach out to them via [email or web address]. If 7PM-7AM, please contact night-coverage If you still have difficulty reaching the attending provider, please page the [facility] (Director on Call) for [facility] on amion for assistance.

## 2023-03-03 NOTE — Consult Note (Addendum)
Consulting Department:  Intensive care unit  Primary Physician:  Luciana Axe, NP  Chief Complaint: Intracranial hemorrhage  History of Present Illness: 03/03/2023 [name] [name] is a 82 y.o. male who presents with the chief complaint of hypotension.  At home he was found to have decreased blood pressure as well as some lethargy.  At arrival his systolic blood pressure was in the 70s, he also had bradycardia, hypothermia, and hypotension.  He has a history of malnutrition.  He was admitted with a sepsis protocol.  He was given antibiotics and he did.  On part of his workup he was found to have intracranial hemorrhage.  Review of Systems:  A 10 point review of systems is negative, except for the pertinent positives and negatives detailed in the HPI.  Past Medical History: Past Medical History:  Diagnosis Date   CHF (congestive heart failure) (HCC)    Diabetes (HCC)    Dysrhythmia    AFIB   HTN (hypertension)    Infection    LEG   Pancreatitis     Past Surgical History: Past Surgical History:  Procedure Laterality Date   ABOVE KNEE LEG AMPUTATION Bilateral    AMPUTATION Right 03/09/2021   Procedure: AMPUTATION FOOT;  Surgeon: Annice Needy, MD;  Location: [facility];  Service: General;  Laterality: Right;   AMPUTATION Right 03/11/2021   Procedure: AMPUTATION ABOVE KNEE;  Surgeon: Annice Needy, MD;  Location: [facility];  Service: General;  Laterality: Right;   AMPUTATION Left 08/13/2022   Procedure: AMPUTATION ABOVE KNEE;  Surgeon: Bertram Denver, MD;  Location: [facility];  Service: Vascular;  Laterality: Left;   CATARACT EXTRACTION W/PHACO Right 03/08/2017   Procedure: CATARACT EXTRACTION PHACO AND INTRAOCULAR LENS PLACEMENT (IOC);  Surgeon: Nevada Crane, MD;  Location: [facility];  Service: Ophthalmology;  Laterality: Right;  Lot# [id number] [place]: 00:30.9 AP%: 8.2 CDE: 2.54   CATARACT EXTRACTION W/PHACO Left 04/04/2017   Procedure: CATARACT EXTRACTION PHACO AND INTRAOCULAR  LENS PLACEMENT (IOC);  Surgeon: Nevada Crane, MD;  Location: [facility];  Service: Ophthalmology;  Laterality: Left;  [place] 00:31.4 AP% 12.0 CDE 3.78 Fluid Pack lot # [id number]   CHOLECYSTECTOMY     EYE SURGERY     right eye   LOWER EXTREMITY ANGIOGRAPHY Right 02/10/2021   Procedure: LOWER EXTREMITY ANGIOGRAPHY;  Surgeon: Annice Needy, MD;  Location: [facility];  Service: Cardiovascular;  Laterality: Right;   LOWER EXTREMITY ANGIOGRAPHY Left 07/03/2022   Procedure: Lower Extremity Angiography;  Surgeon: Annice Needy, MD;  Location: [facility];  Service: Cardiovascular;  Laterality: Left;   LOWER EXTREMITY ANGIOGRAPHY Left 07/24/2022   Procedure: Lower Extremity Angiography;  Surgeon: Annice Needy, MD;  Location: [facility];  Service: Cardiovascular;  Laterality: Left;   LOWER EXTREMITY ANGIOGRAPHY Left 08/11/2022   Procedure: Lower Extremity Angiography;  Surgeon: Annice Needy, MD;  Location: [facility];  Service: Cardiovascular;  Laterality: Left;    Allergies: Allergies as of 03/01/2023   (No Known Allergies)    Medications:  Current Facility-Administered Medications:    ascorbic acid (VITAMIN C) tablet 500 mg, 500 mg, Oral, BID, Karna Christmas, Fuad, MD, 500 mg at 03/02/23 2111   Chlorhexidine Gluconate Cloth 2 % PADS 6 each, 6 each, Topical, Daily, Kasa, Kurian, MD, 6 each at 03/02/23 2230   ciprofloxacin (CIPRO) tablet 250 mg, 250 mg, Oral, BID, Karna Christmas, Fuad, MD, 250 mg at 03/02/23 2027   docusate sodium (COLACE) capsule  100 mg, 100 mg, Oral, BID PRN, Judithe Modest, NP   feeding supplement (ENSURE ENLIVE / ENSURE PLUS) liquid 237 mL, 237 mL, Oral, TID BM, Karna Christmas, Fuad, MD, 237 mL at 03/02/23 2027   fludrocortisone (FLORINEF) tablet 0.2 mg, 0.2 mg, Oral, Daily, Karna Christmas, Fuad, MD, 0.2 mg at 03/02/23 1702   heparin injection 5,000 Units, 5,000 Units, Subcutaneous, Q8H, Harlon Ditty D, NP, 5,000 Units at 03/03/23 1660   multivitamin  with minerals tablet 1 tablet, 1 tablet, Oral, Daily, Vida Rigger, MD   Oral care mouth rinse, 15 mL, Mouth Rinse, PRN, Vida Rigger, MD, 15 mL at 03/02/23 0913   polyethylene glycol (MIRALAX / GLYCOLAX) packet 17 g, 17 g, Oral, Daily PRN, Judithe Modest, NP   Social History: Social History   Tobacco Use   Smoking status: Never   Smokeless tobacco: Current    Types: Chew  Vaping Use   Vaping status: Never Used  Substance Use Topics   Alcohol use: No    Alcohol/week: 0.0 standard drinks of alcohol   Drug use: No    Family Medical History: History reviewed. No pertinent family history.  Physical Examination: Vitals:   03/03/23 0600 03/03/23 0700  BP: (!) 158/77 (!) 158/85  Pulse: 83 94  Resp: (!) 31 14  Temp:    SpO2: 100% 100%     General: Patient is well developed, well nourished, calm, collected, and in no apparent distress.  NEUROLOGICAL:  General: In no acute distress.   Awake, alert, oriented to person, place, and time.  Pupils equal round and reactive to light.  Full Facial tone is symmetric.  Tongue protrusion is midline.  Bilateral upper extremities are full strength proximally and distally.   Language is conversant-with some barrier.  GCS:14, some intermittent confusion in his language  Bilateral AKA, able to move his bilateral upper extremities antigravity with no clear major deficit.  Patient does have some impulsivity so difficult to do a full formal testing on his motor examination however he does not appear to have any active paresis. - Imaging: Narrative & Impression  CLINICAL DATA:  Mental status change, unknown cause. Hypotension. Lethargy.   EXAM: CT HEAD WITHOUT CONTRAST   TECHNIQUE: Contiguous axial images were obtained from the base of the skull through the vertex without intravenous contrast.   RADIATION DOSE REDUCTION: This exam was performed according to the departmental dose-optimization program which includes  automated exposure control, adjustment of the mA and/or kV according to patient size and/or use of iterative reconstruction technique.   COMPARISON:  Head CT and MRI 10/26/2022   FINDINGS: Brain: There is a 10 mm hyperdense cortical/subcortical lesion in the high posterior left frontal lobe which has increased in size and conspicuity compared to the prior CT where in retrospect a more subtly hyperdense 5 mm lesion was present. There was corresponding susceptibility and FLAIR hypointensity in this location on MRI. There is no associated edema. The large right MCA infarct on the previous studies demonstrates expected interval evolution with development of encephalomalacia and mild ex vacuo dilatation of the right lateral ventricle. There is a background of mild chronic small vessel ischemia in the cerebral white matter. Small chronic infarcts are again seen in the right lentiform nucleus and cerebellum. Mild cerebral atrophy is within normal limits for age. No acute large territory infarct, midline shift, or extra-axial fluid collection is evident.   Vascular: Calcified atherosclerosis at the skull base. No hyperdense vessel.   Skull: No fracture or suspicious osseous  lesion.   Sinuses/Orbits: Paranasal sinuses and mastoid air cells are clear. Bilateral cataract extraction.   Other: None.   These results will be called to the ordering clinician or representative by the Radiologist Assistant, and communication documented in the PACS or [employer].   IMPRESSION: 1. Increased size of a 10 mm hyperdense lesion in the high posterior left frontal lobe, indeterminate but could reflect interval hemorrhage within a cavernoma or a small tumor/metastasis. No associated edema. Brain MRI without and with contrast could further evaluate. 2. Expected interval evolution of a now chronic right MCA infarct.     Electronically Signed   By: Sebastian Ache M.D.   On: 03/01/2023 17:42      Narrative & Impression  CLINICAL DATA:  82 year old male altered mental status. Hypotension. Lethargy. 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 New left frontal lobe lesion on CT.   EXAM: MRI HEAD WITHOUT CONTRAST   TECHNIQUE: Multiplanar, multiecho pulse sequences of the brain and surrounding structures were obtained without intravenous contrast.   COMPARISON:  Head CT 03/01/2023.  Brain MRI, CTA 10/26/2022.   FINDINGS: Brain: Confirmed small area of intra-axial hemorrhage at the left middle or superior frontal gyrus corresponding to the CT finding (series 9, image 39 series 10, image 50), stable small volume (estimated 7 mm diameter). Mild adjacent edema or encephalomalacia and mild susceptibility artifact on DWI there. Furthermore, pre-existing blood products here on the July MRI, no T2 signal of a discrete cavernous venous malformation at that time.   No larger area of left MCA restricted diffusion. No extra-axial or intraventricular hemorrhage is identified.   Compared to 10/26/2022 hemosiderin in the right MCA territory has mildly progressed (series 10, image 45), and there is new hemosiderin in the lateral right occipital pole also (image 29). And encephalomalacia in those areas has progressed.   No restricted diffusion elsewhere. No midline shift or ventriculomegaly. 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 Cervicomedullary junction and pituitary are within normal limits.   Vascular: Major intracranial vascular flow voids are stable, but chronically abnormal at the vertebrobasilar junction. There are fetal type PCA origins and thus a diminutive proximal vertebrobasilar system. But also abnormal vertebrobasilar atherosclerosis and stenosis was demonstrated by CTA in July.   Skull and upper cervical spine: Partially visible cervical spine degeneration. Background bone marrow signal within normal limits.   Sinuses/Orbits: Stable.   Other: Mastoids are clear.  Grossly normal visible internal auditory structures.   IMPRESSION: 1. Confirmed small acute on chronic intra-axial hemorrhage at the left middle or superior frontal gyrus, corresponding to the CT finding. No mass effect or complicating features. Favor acute on chronic small vessel disease, as some pre-existing hemosiderin but no convincing cavernoma there on July MRI.   2. No other acute finding. Underlying advanced ischemic disease. Progressed posterior right MCA and right MCA/PCA watershed territory encephalomalacia and hemosiderin since July. Underlying chronic vertebrobasilar atherosclerosis and stenosis.     Electronically Signed   By: Odessa Fleming M.D.   On: 03/02/2023 05:19       I have personally reviewed the images and agree with the above interpretation.  Labs:    Latest Ref Rng & Units 03/03/2023    3:25 AM 03/02/2023    6:22 AM 03/01/2023   12:39 PM  CBC  WBC 4.0 - 10.5 K/uL 8.2  3.7  3.6   Hemoglobin 13.0 - 17.0 g/dL 9.1  9.5  82.5   Hematocrit 39.0 - 52.0 % 26.1  28.0  34.1  Platelets 150 - 400 K/uL 107  95  110        Assessment and Plan: Mr. Cashner is a pleasant 82 y.o. male with a recent admission for possible sepsis, was initiated on a sepsis protocol and admitted to the intensive care unit.  As part of his workup he had a CT scan of his head which demonstrated a very small approximately 1 cm round hemorrhage in his left frontal lobe.  This is causing no mass effect.  Given the finding neurosurgery was consulted.  On physical examination the patient is at his baseline.  He is conversant, bilateral lower extremity amputations, able to move his upper extremities without significant difficulty.  Able to protrude his tongue midline.  States that he does not have any headache.  He had previous imaging and July of this year which demonstrated hemosiderin deposition at this location, differential includes progressive small vascular disease with interval hemorrhage,  versus cavernoma hemorrhage.  Currently this is asymptomatic, he should have a follow-up head CT in 2 to 4 weeks.  Would not initiate antiseizure prophylaxis given his age and medical comorbidities.  Does not have a surgical indication.  From an anticoagulation standpoint DVT prophylaxis is clear to restart.  Prefer to hold anticoagulation for 2 to 4 weeks total depending on risk stratification for his anticoagulation treatment.  We will follow patient in clinic in approximately 1 month.  Lovenia Kim, MD/MSCR Dept. of Neurosurgery

## 2023-03-03 NOTE — Consult Note (Addendum)
Pharmacy Consult Note - Electrolytes  Sodium (mmol/L)  Date Value  03/03/2023 143  02/24/2012 143   Potassium (mmol/L)  Date Value  03/03/2023 3.5  02/24/2012 4.1   Magnesium (mg/dL)  Date Value  96/29/5284 2.2  02/23/2012 2.1   Calcium (mg/dL)  Date Value  13/24/4010 9.0   Calcium, Total (mg/dL)  Date Value  27/25/3664 9.1   Albumin (g/dL)  Date Value  40/34/7425 2.7 (L)  02/22/2012 3.6   Phosphorus (mg/dL)  Date Value  95/63/8756 2.1 (L)   ASSESSMENT: 82 y.o. male with PMH including Afib, bilateral AKA, CHF, pancreatitis, HTN who presents with hypotension requiring vasopressors. Per family patient started mirtazapine as a new medication last night and since then has been hypotensive. Pharmacy has been consulted to monitor and replace electrolytes. Patient in AKI with Scr 1.48, up from baseline of 0.9-1.1.  Lab Results  Component Value Date   CREATININE 1.16 03/03/2023   CREATININE 1.20 03/02/2023   CREATININE 1.48 (H) 03/01/2023   Fluids: LR boluses x 3 and LR @ 150 cc/hr  Goal of Therapy:  [x]  Electrolytes WNL [x]  K >=4.0 and Mg >=2.0  PLAN: Phos 2.1,  K 3.5; Scr 1.16,  Will order potassium phosphate 30 mmol IV x 1  Patient now out of ICU, CCM pharmacy monitoring will sign off  Thank you for allowing pharmacy to be a part of this patient's care.  Aesha Agrawal A, PharmD 03/03/2023 11:40 AM

## 2023-03-04 DIAGNOSIS — R579 Shock, unspecified: Secondary | ICD-10-CM | POA: Diagnosis not present

## 2023-03-04 LAB — PHOSPHORUS: Phosphorus: 3.3 mg/dL (ref 2.5–4.6)

## 2023-03-04 LAB — URINE CULTURE: Culture: 80000 — AB

## 2023-03-04 LAB — GLUCOSE, CAPILLARY
Glucose-Capillary: 100 mg/dL — ABNORMAL HIGH (ref 70–99)
Glucose-Capillary: 110 mg/dL — ABNORMAL HIGH (ref 70–99)
Glucose-Capillary: 129 mg/dL — ABNORMAL HIGH (ref 70–99)
Glucose-Capillary: 148 mg/dL — ABNORMAL HIGH (ref 70–99)
Glucose-Capillary: 69 mg/dL — ABNORMAL LOW (ref 70–99)
Glucose-Capillary: 75 mg/dL (ref 70–99)
Glucose-Capillary: 81 mg/dL (ref 70–99)
Glucose-Capillary: 90 mg/dL (ref 70–99)

## 2023-03-04 LAB — CBC
HCT: 25.1 % — ABNORMAL LOW (ref 39.0–52.0)
Hemoglobin: 8.9 g/dL — ABNORMAL LOW (ref 13.0–17.0)
MCH: 28.8 pg (ref 26.0–34.0)
MCHC: 35.5 g/dL (ref 30.0–36.0)
MCV: 81.2 fL (ref 80.0–100.0)
Platelets: 93 10*3/uL — ABNORMAL LOW (ref 150–400)
RBC: 3.09 MIL/uL — ABNORMAL LOW (ref 4.22–5.81)
RDW: 15.5 % (ref 11.5–15.5)
WBC: 7.9 10*3/uL (ref 4.0–10.5)
nRBC: 0 % (ref 0.0–0.2)

## 2023-03-04 LAB — BASIC METABOLIC PANEL
Anion gap: 6 (ref 5–15)
BUN: 12 mg/dL (ref 8–23)
CO2: 25 mmol/L (ref 22–32)
Calcium: 8.7 mg/dL — ABNORMAL LOW (ref 8.9–10.3)
Chloride: 113 mmol/L — ABNORMAL HIGH (ref 98–111)
Creatinine, Ser: 1.13 mg/dL (ref 0.61–1.24)
GFR, Estimated: >60 mL/min (ref >60)
Glucose, Bld: 86 mg/dL (ref 70–99)
Potassium: 4 mmol/L (ref 3.5–5.1)
Sodium: 144 mmol/L (ref 135–145)

## 2023-03-04 LAB — MAGNESIUM: Magnesium: 2 mg/dL (ref 1.7–2.4)

## 2023-03-04 MED ORDER — AMOXICILLIN 500 MG PO CAPS
500.0000 mg | ORAL_CAPSULE | Freq: Three times a day (TID) | ORAL | Status: DC
  Administered 2023-03-04 – 2023-03-06 (×8): 500 mg via ORAL
  Filled 2023-03-04 (×8): qty 1

## 2023-03-04 NOTE — Progress Notes (Signed)
FSBS 69 @0014 , drinks given along with snacks, recheck  at  0058 90mg /dl, care ongoing.

## 2023-03-04 NOTE — Progress Notes (Signed)
[facility] Progress Note  Patient: [name] [name]    WUJ:[id number]  DOA: 03/01/2023     Date of Service: the patient was seen and examined on 03/04/2023  Chief Complaint  Patient presents with   Hypotension   Brief hospital course: 82 year old male with PMH of A-fib, HTN, CVA, PAD s/p bilateral AKA, history of pancreatitis, anemia of chronic disease, GERD, as reviewed from EMR, presented at [facility] ED with low BP reading and lethargy at home, wife reported that SBP was in the 70s. Patient BP readings are typically in the 110's. Upon arrival the patient was bradycardic, hypothermic and hypotensive.  [name] reported that the patient has minimal fluid intake on a regular basis, a history of malnutrition and hypoalbuminemia secondary to poor protein intake.  ED course:  Code sepsis activated in ED and blood cultures collected. -On [place], IVF given(1L), Started on Levophed gtt -Given 1 dose Ceftriaxone, Acute kidney injury, LA 1.2 PCCM consulted to admit for sepsis Patient unresponsive/comatose and  ill appearing, Wife was at bedside  CT Head: Increased size of a 10 mm hyperdense lesion in the high posterior left frontal lobe, indeterminate but could reflect interval hemorrhage within a cavernoma or a small tumor/metastasis. No associated edema. Brain MRI without and with contrast could further evaluate. 2. Expected interval evolution of a now chronic right MCA infarct.  MRI Brain: Confirmed small acute on chronic intra-axial hemorrhage at the left middle or superior frontal gyrus, corresponding to the CT finding. No mass effect or complicating features. Favor acute on chronic small vessel disease, as some pre-existing hemosiderin but no convincing cavernoma there on July MRI. 2. No other acute finding. Underlying advanced ischemic disease. Progressed posterior right MCA and right MCA/PCA watershed territory encephalomalacia and hemosiderin since July. Underlying chronic  vertebrobasilar atherosclerosis and stenosis.  11/9 patient was stabilized in the ICU, Levophed weaned off, patient was transferred to [facility] service today  Assessment and Plan:  # Sepsis due to UTI and hypovolemic shock S/p IV fluid and Levophed given in the ED and admitted to the ICU. Vital signs improved, Levophed weaned off.  BP stable, elevated now S/p Florinef, which has been discontinued due to elevated blood pressure S/p ceftriaxone, s/p ciprofloxacin 250 mg p.o. BID.  Blood culture NGTD Urine culture 80 K, E faecalis VRE, sensitive to penicillin and nitrofurantoin, resistant to vancomycin Monitor vital signs 11/10 started amoxicillin 500 mg p.o. 3 times daily  # Intracerebral hemorrhage CT and MRI brain reports reviewed as above Seen by neurosurgery, recommended repeat CT head in 2 to 4 weeks, hold Coumadin for 2 to 4 weeks.  No surgical intervention at this time   # Hypophosphatemia due to nutritional deficiency.  Phos repleted.  Resolved Monitor electrolytes and replete as needed.  # AKI due to hypovolemic shock Status post IV fluid and Levophed, renal functions improved Creatinine 1.48 --->1.13, AKI resolved. Monitor BMP daily and check urine output  # A-fib, HTN, PAD s/p B/L AKA, CVA Hold Coumadin for 2 to 4 weeks and resume when cleared by neurosurgery after repeating CT head in 2 to 4 weeks Resumed Coreg 6.25 mg p.o. twice daily, lisinopril 15 mg p.o. daily home dose Resumed Lipitor 10 mg p.o. daily Hold the Lasix for now, patient is euvolemic   # Anemia of chronic disease, hemoglobin 9.1 stable Monitor H&H # Thrombocytopenia could be secondary to sepsis, monitor platelet count daily.   Body mass index is 19.5 kg/m.  Nutrition Problem: Unintentional weight loss  Etiology: chronic illness Interventions:  Pressure Injury 03/01/23 Sacrum Mid;Lower Stage 2 -  Partial thickness loss of dermis presenting as a shallow open injury with a red, pink wound bed without  slough. 2mm X 2mm (Active)  03/01/23 1615  Location: Sacrum  Location Orientation: Mid;Lower  Staging: Stage 2 -  Partial thickness loss of dermis presenting as a shallow open injury with a red, pink wound bed without slough.  Wound Description (Comments): 2mm X 2mm  Present on Admission: Yes  Dressing Type Foam - Lift dressing to assess site every shift 03/03/23 1945     Diet: Dysphagia 3 diet with thin liquids DVT Prophylaxis: Subcutaneous Heparin    Advance goals of care discussion: DNR-limited  Family Communication: family was not present at bedside, at the time of interview.  The pt provided permission to discuss medical plan with the family. Opportunity was given to ask question and all questions were answered satisfactorily.   Disposition:  Pt is from home, admitted with sepsis, UTI, s/p Levophed ICU stay, downgraded on 11/9 under [facility] service, which precludes a safe discharge. Discharge to home, when stable, may need 1-2 more days to improve.  Follow urine culture  Subjective: No significant events overnight, patient is pleasantly confused, AO x 1 to 2, does not know about month and the year.  Patient was resting comfortably, denied any complaints, no headache or dizziness, no chest pain or palpitation no shortness of breath.  No abdominal pain.   Physical Exam: General: NAD, lying comfortably Appear in no distress, affect appropriate Eyes: PERRLA ENT: Oral Mucosa Clear, moist  Neck: no JVD,  Cardiovascular: S1 and S2 Present, no Murmur,  Respiratory: good respiratory effort, Bilateral Air entry equal and Decreased, no Crackles, no wheezes Abdomen: Bowel Sound present, Soft and no tenderness,  Skin: no rashes Extremities: B/L AKA, upper extremities no edema Neurologic: without any new focal findings Bedbound due to bilateral AKA  Vitals:   03/03/23 2054 03/04/23 0445 03/04/23 0500 03/04/23 0721  BP: 101/60 104/72  (!) 141/88  Pulse: 80 67  69  Resp: 12 18  19    Temp: 99.2 F (37.3 C) 97.9 F (36.6 C)  97.8 F (36.6 C)  TempSrc: Oral Oral  Oral  SpO2: 97% 100%  99%  Weight:   68.9 kg   Height:        Intake/Output Summary (Last 24 hours) at 03/04/2023 1215 Last data filed at 03/04/2023 0602 Gross per 24 hour  Intake 510 ml  Output 950 ml  Net -440 ml   Filed Weights   03/02/23 0500 03/03/23 0408 03/04/23 0500  Weight: 67.5 kg 68.2 kg 68.9 kg    Data Reviewed: I have personally reviewed and interpreted daily labs, tele strips, imagings as discussed above. I reviewed all nursing notes, pharmacy notes, vitals, pertinent old records I have discussed plan of care as described above with RN and patient/family.  CBC: Recent Labs  Lab 03/01/23 1239 03/02/23 0622 03/03/23 0325 03/04/23 0355  WBC 3.6* 3.7* 8.2 7.9  NEUTROABS 1.3*  --   --   --   HGB 11.8* 9.5* 9.1* 8.9*  HCT 34.1* 28.0* 26.1* 25.1*  MCV 83.0 84.3 80.8 81.2  PLT 110* 95* 107* 93*   Basic Metabolic Panel: Recent Labs  Lab 03/01/23 1239 03/02/23 0622 03/03/23 0325 03/04/23 0355  NA 135 141 143 144  K 4.2 3.7 3.5 4.0  CL 102 109 111 113*  CO2 24 26 23 25   GLUCOSE 81 116* 89  86  BUN 15 11 15 12   CREATININE 1.48* 1.20 1.16 1.13  CALCIUM 9.0 8.6* 9.0 8.7*  MG 2.2 1.7 2.2 2.0  PHOS  --  2.0* 2.1* 3.3    Studies: No results found.  Scheduled Meds:  amoxicillin  500 mg Oral Q8H   vitamin C  500 mg Oral BID   atorvastatin  10 mg Oral QHS   carvedilol  6.25 mg Oral BID   feeding supplement  237 mL Oral TID BM   gabapentin  100 mg Oral TID   heparin injection (subcutaneous)  5,000 Units Subcutaneous Q8H   lisinopril  15 mg Oral Daily   mirtazapine  7.5 mg Oral QHS   multivitamin with minerals  1 tablet Oral Daily   Continuous Infusions:   PRN Meds: acetaminophen, docusate sodium, hydrALAZINE, ondansetron (ZOFRAN) IV, mouth rinse, polyethylene glycol  Time spent: 40 minutes  Author: Gillis Santa. MD [facility] 03/04/2023 12:15 PM  To  reach On-call, see care teams to locate the attending and reach out to them via [email or web address]. If 7PM-7AM, please contact night-coverage If you still have difficulty reaching the attending provider, please page the [facility] (Director on Call) for [facility] on amion for assistance.

## 2023-03-05 ENCOUNTER — Telehealth: Payer: Self-pay | Admitting: Neurosurgery

## 2023-03-05 DIAGNOSIS — I629 Nontraumatic intracranial hemorrhage, unspecified: Secondary | ICD-10-CM

## 2023-03-05 DIAGNOSIS — R579 Shock, unspecified: Secondary | ICD-10-CM | POA: Diagnosis not present

## 2023-03-05 DIAGNOSIS — E43 Unspecified severe protein-calorie malnutrition: Secondary | ICD-10-CM | POA: Insufficient documentation

## 2023-03-05 LAB — BASIC METABOLIC PANEL
Anion gap: 4 — ABNORMAL LOW (ref 5–15)
BUN: 21 mg/dL (ref 8–23)
CO2: 24 mmol/L (ref 22–32)
Calcium: 8.7 mg/dL — ABNORMAL LOW (ref 8.9–10.3)
Chloride: 113 mmol/L — ABNORMAL HIGH (ref 98–111)
Creatinine, Ser: 1.19 mg/dL (ref 0.61–1.24)
GFR, Estimated: >60 mL/min (ref >60)
Glucose, Bld: 90 mg/dL (ref 70–99)
Potassium: 4.2 mmol/L (ref 3.5–5.1)
Sodium: 141 mmol/L (ref 135–145)

## 2023-03-05 LAB — GLUCOSE, CAPILLARY
Glucose-Capillary: 106 mg/dL — ABNORMAL HIGH (ref 70–99)
Glucose-Capillary: 115 mg/dL — ABNORMAL HIGH (ref 70–99)
Glucose-Capillary: 122 mg/dL — ABNORMAL HIGH (ref 70–99)
Glucose-Capillary: 128 mg/dL — ABNORMAL HIGH (ref 70–99)
Glucose-Capillary: 67 mg/dL — ABNORMAL LOW (ref 70–99)
Glucose-Capillary: 76 mg/dL (ref 70–99)
Glucose-Capillary: 99 mg/dL (ref 70–99)

## 2023-03-05 LAB — CBC
HCT: 23.7 % — ABNORMAL LOW (ref 39.0–52.0)
Hemoglobin: 8.2 g/dL — ABNORMAL LOW (ref 13.0–17.0)
MCH: 28.4 pg (ref 26.0–34.0)
MCHC: 34.6 g/dL (ref 30.0–36.0)
MCV: 82 fL (ref 80.0–100.0)
Platelets: 83 10*3/uL — ABNORMAL LOW (ref 150–400)
RBC: 2.89 MIL/uL — ABNORMAL LOW (ref 4.22–5.81)
RDW: 15.7 % — ABNORMAL HIGH (ref 11.5–15.5)
WBC: 7.6 10*3/uL (ref 4.0–10.5)
nRBC: 0 % (ref 0.0–0.2)

## 2023-03-05 LAB — PHOSPHORUS: Phosphorus: 2.4 mg/dL — ABNORMAL LOW (ref 2.5–4.6)

## 2023-03-05 LAB — MAGNESIUM: Magnesium: 2.2 mg/dL (ref 1.7–2.4)

## 2023-03-05 MED ORDER — HALOPERIDOL LACTATE 5 MG/ML IJ SOLN
2.0000 mg | Freq: Four times a day (QID) | INTRAMUSCULAR | Status: DC | PRN
  Filled 2023-03-05: qty 1

## 2023-03-05 MED ORDER — CARVEDILOL 6.25 MG PO TABS: 6.2500 mg | ORAL_TABLET | Freq: Two times a day (BID) | ORAL | Status: DC

## 2023-03-05 MED ORDER — K PHOS MONO-SOD PHOS DI & MONO 155-852-130 MG PO TABS
500.0000 mg | ORAL_TABLET | Freq: Once | ORAL | Status: AC
  Administered 2023-03-05: 500 mg via ORAL
  Filled 2023-03-05: qty 2

## 2023-03-05 NOTE — Telephone Encounter (Signed)
CT has been ordered (to be done 03/19/23-04/02/23). Please make sure they are aware of the time frame of the repeat CT scan and please schedule a return appointment with Nehemiah Settle or Duwayne Heck. Thanks

## 2023-03-05 NOTE — Telephone Encounter (Signed)
Please see below per Dr.Smith  Follow-up in 3 to 4 weeks with repeat head CT, okay for APP clinic

## 2023-03-05 NOTE — Progress Notes (Signed)
[facility] Progress Note  Patient: [name] [name]    GEX:[id number]  DOA: 03/01/2023     Date of Service: the patient was seen and examined on 03/05/2023  Chief Complaint  Patient presents with   Hypotension   Brief hospital course: 82 year old male with PMH of A-fib, HTN, CVA, PAD s/p bilateral AKA, history of pancreatitis, anemia of chronic disease, GERD, as reviewed from EMR, presented at [facility] ED with low BP reading and lethargy at home, wife reported that SBP was in the 70s. Patient BP readings are typically in the 110's. Upon arrival the patient was bradycardic, hypothermic and hypotensive.  [name] reported that the patient has minimal fluid intake on a regular basis, a history of malnutrition and hypoalbuminemia secondary to poor protein intake.  ED course:  Code sepsis activated in ED and blood cultures collected. -On Lawyer, IVF given(1L), Started on Levophed gtt -Given 1 dose Ceftriaxone, Acute kidney injury, LA 1.2 PCCM consulted to admit for sepsis Patient unresponsive/comatose and  ill appearing, Wife was at bedside  CT Head: Increased size of a 10 mm hyperdense lesion in the high posterior left frontal lobe, indeterminate but could reflect interval hemorrhage within a cavernoma or a small tumor/metastasis. No associated edema. Brain MRI without and with contrast could further evaluate. 2. Expected interval evolution of a now chronic right MCA infarct.  MRI Brain: Confirmed small acute on chronic intra-axial hemorrhage at the left middle or superior frontal gyrus, corresponding to the CT finding. No mass effect or complicating features. Favor acute on chronic small vessel disease, as some pre-existing hemosiderin but no convincing cavernoma there on July MRI. 2. No other acute finding. Underlying advanced ischemic disease. Progressed posterior right MCA and right MCA/PCA watershed territory encephalomalacia and hemosiderin since July. Underlying chronic  vertebrobasilar atherosclerosis and stenosis.  11/9 patient was stabilized in the ICU, Levophed weaned off, patient was transferred to [facility] service today  Assessment and Plan:  # Sepsis due to UTI and hypovolemic shock S/p IV fluid and Levophed given in the ED and admitted to the ICU. Vital signs improved, Levophed weaned off.  BP stable, elevated now S/p Florinef, which has been discontinued due to elevated blood pressure S/p ceftriaxone, s/p ciprofloxacin 250 mg p.o. BID.  Blood culture NGTD Urine culture 80 K, E faecalis VRE, sensitive to penicillin and nitrofurantoin, resistant to vancomycin Monitor vital signs 11/10 started amoxicillin 500 mg p.o. 3 times daily  # Intracerebral hemorrhage CT and MRI brain reports reviewed as above Seen by neurosurgery, recommended repeat CT head in 2 to 4 weeks, hold Coumadin for 2 to 4 weeks.  No surgical intervention at this time   # Hypophosphatemia due to nutritional deficiency.  Phos repleted.  Monitor electrolytes and replete as needed.  # AKI due to hypovolemic shock Status post IV fluid and Levophed, renal functions improved Creatinine 1.48 --->1.13, AKI resolved. Monitor BMP daily and check urine output  # A-fib, HTN, PAD s/p B/L AKA, CVA Hold Coumadin for 2 to 4 weeks and resume when cleared by neurosurgery after repeating CT head in 2 to 4 weeks Resumed Coreg 6.25 mg p.o. twice daily, lisinopril 15 mg p.o. daily home dose Resumed Lipitor 10 mg p.o. daily Hold the Lasix for now, patient is euvolemic 11/11 blood pressure remained soft, held Coreg and lisinopril for now.  # Anemia of chronic disease, hemoglobin 9.1 stable Monitor H&H # Thrombocytopenia could be secondary to sepsis, monitor platelet count daily.   Body mass index  is 19.5 kg/m.  Nutrition Problem: Severe Malnutrition Etiology: chronic illness (CHF) Interventions:  Pressure Injury 03/01/23 Sacrum Mid;Lower Stage 2 -  Partial thickness loss of dermis presenting  as a shallow open injury with a red, pink wound bed without slough. 2mm X 2mm (Active)  03/01/23 1615  Location: Sacrum  Location Orientation: Mid;Lower  Staging: Stage 2 -  Partial thickness loss of dermis presenting as a shallow open injury with a red, pink wound bed without slough.  Wound Description (Comments): 2mm X 2mm  Present on Admission: Yes  Dressing Type Foam - Lift dressing to assess site every shift 03/05/23 0932     Diet: Dysphagia 3 diet with thin liquids DVT Prophylaxis: Subcutaneous Heparin    Advance goals of care discussion: DNR-limited  Family Communication: family was not present at bedside, at the time of interview.  The pt provided permission to discuss medical plan with the family. Opportunity was given to ask question and all questions were answered satisfactorily.  11/11 called patient's spouse and patient's son but no one answered the phone.  Disposition:  Pt is from home, admitted with sepsis, UTI, s/p Levophed ICU stay, downgraded on 11/9 under [facility] service, which precludes a safe discharge. Discharge to home, when stable, may need 1-2 more days to improve.  11/11 still blood pressure is soft, we will continue to monitor and possible discharge tomorrow a.m.  Subjective: No significant events overnight, patient remained confused, AO x 1 to 2.  Denied any complaints.  Patient was resting comfortably and he was eating breakfast with help.  Physical Exam: General: NAD, lying comfortably Appear in no distress, affect appropriate Eyes: PERRLA ENT: Oral Mucosa Clear, moist  Neck: no JVD,  Cardiovascular: S1 and S2 Present, no Murmur,  Respiratory: good respiratory effort, Bilateral Air entry equal and Decreased, no Crackles, no wheezes Abdomen: Bowel Sound present, Soft and no tenderness,  Skin: no rashes Extremities: B/L AKA, upper extremities no edema Neurologic: without any new focal findings Bedbound due to bilateral AKA  Vitals:   03/04/23 2015  03/05/23 0524 03/05/23 0750 03/05/23 1130  BP: 127/83 (!) 108/47 (!) 95/52 104/75  Pulse: 80 (!) 51 (!) 51 72  Resp: 19 15 15 20   Temp: 98.6 F (37 C) 98.6 F (37 C) 98.8 F (37.1 C) 98 F (36.7 C)  TempSrc: Oral Oral  Oral  SpO2: 98% 98% 100% 100%  Weight:      Height:        Intake/Output Summary (Last 24 hours) at 03/05/2023 1456 Last data filed at 03/05/2023 1332 Gross per 24 hour  Intake 200 ml  Output 1600 ml  Net -1400 ml   Filed Weights   03/02/23 0500 03/03/23 0408 03/04/23 0500  Weight: 67.5 kg 68.2 kg 68.9 kg    Data Reviewed: I have personally reviewed and interpreted daily labs, tele strips, imagings as discussed above. I reviewed all nursing notes, pharmacy notes, vitals, pertinent old records I have discussed plan of care as described above with RN and patient/family.  CBC: Recent Labs  Lab 03/01/23 1239 03/02/23 0622 03/03/23 0325 03/04/23 0355 03/05/23 0342  WBC 3.6* 3.7* 8.2 7.9 7.6  NEUTROABS 1.3*  --   --   --   --   HGB 11.8* 9.5* 9.1* 8.9* 8.2*  HCT 34.1* 28.0* 26.1* 25.1* 23.7*  MCV 83.0 84.3 80.8 81.2 82.0  PLT 110* 95* 107* 93* 83*   Basic Metabolic Panel: Recent Labs  Lab 03/01/23 1239 03/02/23 0622 03/03/23 0325  03/04/23 0355 03/05/23 0342  NA 135 141 143 144 141  K 4.2 3.7 3.5 4.0 4.2  CL 102 109 111 113* 113*  CO2 24 26 23 25 24   GLUCOSE 81 116* 89 86 90  BUN 15 11 15 12 21   CREATININE 1.48* 1.20 1.16 1.13 1.19  CALCIUM 9.0 8.6* 9.0 8.7* 8.7*  MG 2.2 1.7 2.2 2.0 2.2  PHOS  --  2.0* 2.1* 3.3 2.4*    Studies: No results found.  Scheduled Meds:  amoxicillin  500 mg Oral Q8H   vitamin C  500 mg Oral BID   atorvastatin  10 mg Oral QHS   [START ON 03/06/2023] carvedilol  6.25 mg Oral BID   feeding supplement  237 mL Oral TID BM   gabapentin  100 mg Oral TID   heparin injection (subcutaneous)  5,000 Units Subcutaneous Q8H   lisinopril  15 mg Oral Daily   mirtazapine  7.5 mg Oral QHS   multivitamin with minerals  1  tablet Oral Daily   Continuous Infusions:   PRN Meds: acetaminophen, docusate sodium, hydrALAZINE, ondansetron (ZOFRAN) IV, mouth rinse, polyethylene glycol  Time spent: 40 minutes  Author: Gillis Santa. MD [facility] 03/05/2023 2:56 PM  To reach On-call, see care teams to locate the attending and reach out to them via [email or web address]. If 7PM-7AM, please contact night-coverage If you still have difficulty reaching the attending provider, please page the [facility] (Director on Call) for [facility] on amion for assistance.

## 2023-03-05 NOTE — Telephone Encounter (Signed)
Dr Katrinka Blazing saw this patient as a consult on 03/02/23.  Per Dr Katrinka Blazing, "CT scan of his head which demonstrated a very small approximately 1 cm round hemorrhage in his left frontal lobe...  Currently this is asymptomatic, he should have a follow-up head CT in 2 to 4 weeks.  Would not initiate antiseizure prophylaxis given his age and medical comorbidities.  Does not have a surgical indication.  From an anticoagulation standpoint DVT prophylaxis is clear to restart.  Prefer to hold anticoagulation for 2 to 4 weeks total depending on risk stratification for his anticoagulation treatment.   We will follow patient in clinic in approximately 1 month."

## 2023-03-05 NOTE — Care Management Important Message (Signed)
Important Message  Patient Details  Name: [name] [name] MRN: [id number] Date of Birth: 1941-03-10   Important Message Given:  N/A - LOS <3 / Initial given by admissions     [name] A [name] 03/05/2023, 9:40 AM

## 2023-03-05 NOTE — Progress Notes (Addendum)
Nutrition Follow-up  DOCUMENTATION CODES:   Severe malnutrition in context of chronic illness  INTERVENTION:   -Continue Ensure Enlive po TID, each supplement provides 350 kcal and 20 grams of protein -Continue Magic cup TID with meals, each supplement provides 290 kcal and 9 grams of protein  -Continue MVI with minerals daily -Continue 500 mg vitamin C BID  NUTRITION DIAGNOSIS:   Severe Malnutrition related to chronic illness (CHF) as evidenced by mild muscle depletion, moderate muscle depletion, moderate fat depletion, severe fat depletion, percent weight loss.  Ongoing  GOAL:   Patient will meet greater than or equal to 90% of their needs  Progressing   MONITOR:   PO intake, Supplement acceptance, Labs, Weight trends, Skin, I & O's  REASON FOR ASSESSMENT:   Malnutrition Screening Tool    ASSESSMENT:   82 y/o male with h/o asthma, Afib, CHF, DM, GERD, HTN, HLD, PAD, CVA, pancreatitis and bilateral AKAs who is admitted with UTI, AKI and sepsis.  Reviewed I/O's: -1.2 L x 24 hours and +4.4 L since admission   Pt lying in bed at time of visit. No family present to provide additional history. Pt was sleeping soundly at time of visit. He did not arouse to voice or touch.   Pt currently on a dysphagia 3 diet. Noted meal completions 25-75%. Noted pt consumed about 1/3 of breakfast tray. Pt also consumed about 50% of his Ensure supplement.   Reviewed wt hx; pt has experienced a 22.7% wt loss over the past 6 months, which is significant for time frame.   Per TOC notes, plan to discharge back home with home health services once medically stable.   Medications reviewed and include vitamin C, neurontin, and remeron.  Labs reviewed: CBGS: 67-148 (inpatient orders for glycemic control are none).    NUTRITION - FOCUSED PHYSICAL EXAM:  Flowsheet Row Most Recent Value  Orbital Region Severe depletion  Upper Arm Region Moderate depletion  Thoracic and Lumbar Region Mild  depletion  Buccal Region Severe depletion  Temple Region Severe depletion  Clavicle Bone Region Mild depletion  Clavicle and Acromion Bone Region Mild depletion  Scapular Bone Region Moderate depletion  Dorsal Hand Mild depletion  Patellar Region Unable to assess  Anterior Thigh Region No depletion  Posterior Calf Region Unable to assess  Edema (RD Assessment) None  Hair Reviewed  Eyes Reviewed  Mouth Reviewed  Skin Reviewed  Nails Reviewed       Diet Order:   Diet Order             DIET DYS 3 Room service appropriate? Yes; Fluid consistency: Thin  Diet effective now                   EDUCATION NEEDS:   No education needs have been identified at this time  Skin:  Skin Assessment: Skin Integrity Issues: Skin Integrity Issues:: Stage II Stage II: sacrum  Last BM:  03/05/23 (type 6)  Height:   Ht Readings from Last 1 Encounters:  03/01/23 6\' 2"  (1.88 m)    Weight:   Wt Readings from Last 1 Encounters:  03/04/23 68.9 kg    Ideal Body Weight:  72.5 kg (adjusted for bilateral AKAs)  BMI:  Body mass index is 19.5 kg/m.  Estimated Nutritional Needs:   Kcal:  1900-2200kcal/day  Protein:  95-110g/day  Fluid:  1.7-2.0L/day    Levada Schilling, RD, LDN, CDCES Registered Dietitian III Certified Diabetes Care and Education Specialist Please refer to [facility] for RD  and/or RD on-call/weekend/after hours pager

## 2023-03-06 DIAGNOSIS — R579 Shock, unspecified: Secondary | ICD-10-CM | POA: Diagnosis not present

## 2023-03-06 LAB — CBC
HCT: 27.1 % — ABNORMAL LOW (ref 39.0–52.0)
Hemoglobin: 9.4 g/dL — ABNORMAL LOW (ref 13.0–17.0)
MCH: 28.7 pg (ref 26.0–34.0)
MCHC: 34.7 g/dL (ref 30.0–36.0)
MCV: 82.6 fL (ref 80.0–100.0)
Platelets: 84 10*3/uL — ABNORMAL LOW (ref 150–400)
RBC: 3.28 MIL/uL — ABNORMAL LOW (ref 4.22–5.81)
RDW: 15.7 % — ABNORMAL HIGH (ref 11.5–15.5)
WBC: 7.4 10*3/uL (ref 4.0–10.5)
nRBC: 0 % (ref 0.0–0.2)

## 2023-03-06 LAB — CULTURE, BLOOD (ROUTINE X 2)
Culture: NO GROWTH
Culture: NO GROWTH
Special Requests: ADEQUATE

## 2023-03-06 LAB — BASIC METABOLIC PANEL
Anion gap: 5 (ref 5–15)
BUN: 27 mg/dL — ABNORMAL HIGH (ref 8–23)
CO2: 25 mmol/L (ref 22–32)
Calcium: 8.4 mg/dL — ABNORMAL LOW (ref 8.9–10.3)
Chloride: 102 mmol/L (ref 98–111)
Creatinine, Ser: 0.94 mg/dL (ref 0.61–1.24)
GFR, Estimated: >60 mL/min (ref >60)
Glucose, Bld: 113 mg/dL — ABNORMAL HIGH (ref 70–99)
Potassium: 5.3 mmol/L — ABNORMAL HIGH (ref 3.5–5.1)
Sodium: 136 mmol/L (ref 135–145)

## 2023-03-06 LAB — GLUCOSE, CAPILLARY
Glucose-Capillary: 133 mg/dL — ABNORMAL HIGH (ref 70–99)
Glucose-Capillary: 77 mg/dL (ref 70–99)
Glucose-Capillary: 83 mg/dL (ref 70–99)
Glucose-Capillary: 95 mg/dL (ref 70–99)

## 2023-03-06 LAB — MAGNESIUM: Magnesium: 2.2 mg/dL (ref 1.7–2.4)

## 2023-03-06 LAB — PHOSPHORUS: Phosphorus: 2.8 mg/dL (ref 2.5–4.6)

## 2023-03-06 MED ORDER — AMOXICILLIN 500 MG PO CAPS: 500.0000 mg | ORAL_CAPSULE | Freq: Three times a day (TID) | ORAL | 0 refills | Status: AC

## 2023-03-06 MED ORDER — CARVEDILOL 3.125 MG PO TABS: 3.1250 mg | ORAL_TABLET | Freq: Two times a day (BID) | ORAL | Status: DC

## 2023-03-06 MED ORDER — ATORVASTATIN CALCIUM 10 MG PO TABS: 10.0000 mg | ORAL_TABLET | Freq: Every day | ORAL | 11 refills | Status: AC

## 2023-03-06 NOTE — Progress Notes (Signed)
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    PT Comments  Patient is agreeable to PT session. Assistance required for bed mobility to achieve long sitting position. Long sitting balance is fair and patient able to sit briefly unsupported. Total assistance for posterior scoot with cues for weight shifting and technique. Patient is limited by scrotal pain. Encouraged frequent repositioning for skin integrity. PT will continue to follow to maximize independence and decrease caregiver burden.    If plan is discharge home, recommend the following: Two people to help with walking and/or transfers;A lot of help with bathing/dressing/bathroom;Assistance with feeding;Assistance with cooking/housework;Assist for transportation;Help with stairs or ramp for entrance;Supervision due to cognitive status   Can travel by private vehicle        Equipment Recommendations  None recommended by PT    Recommendations for Other Services       Precautions / Restrictions Precautions Precautions: Fall Restrictions Weight Bearing Restrictions: No     Mobility  Bed Mobility Overal bed mobility: Needs Assistance             General bed mobility comments: from head of bed elevated at 45 degrees to long sitting, patient required minimal assistance. cues for technique.    Transfers                   General transfer comment: patient required total assistance for scooting posteriorly in the bed with cues for weight shifting and technique. patient  is limited by scrotal pain with movement. encouraged frequent repositioning to prevent against skin breakdown    Ambulation/Gait                   Stairs             Wheelchair Mobility     Tilt Bed    Modified Rankin (Stroke Patients Only)       Balance Overall balance assessment: Needs assistance Sitting-balance support: No upper extremity supported Sitting balance-Leahy Scale: Fair Sitting balance - Comments: patient able to maintain long sitting without assistance for brief periods                                    Cognition Arousal: Alert Behavior During Therapy: WFL for tasks assessed/performed Overall Cognitive Status: No family/caregiver present to determine baseline cognitive functioning                                 General Comments: patient needs increased time for following single step commands        Exercises      General Comments        Pertinent Vitals/Pain Pain Assessment Pain Assessment: Faces Faces Pain Scale: Hurts little more Pain Location: scrotum area Pain Descriptors / Indicators: Discomfort Pain Intervention(s): Limited activity within patient's tolerance, Monitored during session    Home Living  Prior Function            PT Goals (current goals can now be found in the care plan section) Acute Rehab PT Goals Patient Stated Goal: to get better PT Goal Formulation: With patient Time For Goal Achievement: 03/16/23 Potential to Achieve Goals: Fair Progress towards PT goals: Progressing toward goals    Frequency    Min 1X/week      PT Plan      Co-evaluation              AM-PAC PT "6 Clicks" Mobility   Outcome Measure  Help needed turning from your back to your side while in a flat bed without using bedrails?: A Lot Help needed moving from lying on your back to sitting on the side of a flat bed without using bedrails?: A Lot Help  needed moving to and from a bed to a chair (including a wheelchair)?: A Lot Help needed standing up from a chair using your arms (e.g., wheelchair or bedside chair)?: Total Help needed to walk in hospital room?: Total Help needed climbing 3-5 steps with a railing? : Total 6 Click Score: 9    End of Session   Activity Tolerance: Patient limited by fatigue Patient left: in bed;with call bell/phone within reach;with bed alarm set Nurse Communication: Mobility status PT Visit Diagnosis: Other abnormalities of gait and mobility (R26.89);Difficulty in walking, not elsewhere classified (R26.2);Muscle weakness (generalized) (M62.81)     Time: 4403-4742 PT Time Calculation (min) (ACUTE ONLY): 13 min  Charges:    $Therapeutic Activity: 8-22 mins PT General Charges $$ ACUTE PT VISIT: 1 Visit                     Donna Bernard, PT, MPT    Ina Homes 03/06/2023, 12:25 PM

## 2023-03-06 NOTE — Care Management Important Message (Signed)
Important Message  Patient Details  Name: [name] [name] MRN: [id number] Date of Birth: November 15, 1940   Important Message Given:  Yes - Medicare IM     Verita Schneiders Lynette Noah 03/06/2023, 1:26 PM

## 2023-03-06 NOTE — TOC Transition Note (Signed)
Transition of Care [facility]) - CM/SW Discharge Note   Patient Details  Name: [name] [name] MRN: [id number] Date of Birth: [birth year]  Transition of Care [facility]) CM/SW Contact:  Allena Katz, LCSW Phone Number: 03/06/2023, 11:49 AM   Clinical Narrative:   Pt has orders to discharge home with resumption of care [facility] orders through [facility]. RN/PT/OT. Cory with [facility] notified. Wife notified and will be here around 3 to pick patient up.    Final next level of care: Home w Home Health Services Barriers to Discharge: Barriers Resolved   Patient Goals and CMS Choice CMS Medicare.gov Compare Post Acute Care list provided to:: Patient Choice offered to / list presented to : Spouse  Discharge Placement                  Patient to be transferred to facility by: wife Name of family member notified: wife annie Patient and family notified of of transfer: 03/06/23  Discharge Plan and Services Additional resources added to the After Visit Summary for       Post Acute Care Choice: Home Health                    HH Arranged: RN, PT, OT [facility] Agency: [facility] Date [facility] Agency Contacted: 03/06/23 Time HH Agency Contacted: [phone] Representative spoke with at [facility] Agency: Kandee Keen  Social Determinants of Health (SDOH) Interventions SDOH Screenings   Food Insecurity: No Food Insecurity (03/01/2023)  Housing: Patient Declined (03/01/2023)  Transportation Needs: No Transportation Needs (03/01/2023)  Utilities: Not At Risk (03/01/2023)  Tobacco Use: High Risk (03/01/2023)     Readmission Risk Interventions     No data to display

## 2023-03-06 NOTE — Discharge Summary (Signed)
[facility] Discharge Summary   Patient: [name] [name] ZOX:[id number]  PCP: Luciana Axe, NP  Date of admission: 03/01/2023   Date of discharge:  03/06/2023     Discharge Diagnoses:  Principal Problem:   Shock (HCC) Active Problems:   Intracranial hemorrhage (HCC)   Pressure injury of skin   Protein-calorie malnutrition, severe   Admitted From: Home Disposition:  Home with [facility]  Recommendations for Outpatient Follow-up:  Follow-up with PCP in 1 week, repeat CBC and BMP in 1 week Monitor BP at home and follow parameters taking medications otherwise hold. Follow with PCP to titrate medications accordingly Follow-up with neurosurgery in 2 to 4 weeks to repeat CT head.  Hold Coumadin and aspirin until cleared by neurosurgery to restart. Follow up LABS/TEST:  As above   Follow-up Information     Luciana Axe, NP Follow up in 1 week(s).   Specialty: Family Medicine Contact information: [address] [place] [address] [phone]         Lovenia Kim, MD Follow up in 2 week(s).   Specialty: Neurosurgery Contact information: [address] [place] [address] [phone]                Diet recommendation: Regular diet  Activity: The patient is advised to gradually reintroduce usual activities, as tolerated  Discharge Condition: stable  Code Status: Full code   History of present illness: As per the H and P dictated on admission Hospital Course:  82 year old male with PMH of A-fib, HTN, CVA, PAD s/p bilateral AKA, history of pancreatitis, anemia of chronic disease, GERD, as reviewed from EMR, presented at [facility] ED with low BP reading and lethargy at home, wife reported that SBP was in the 70s. Patient BP readings are typically in the 110's. Upon arrival the patient was bradycardic, hypothermic and hypotensive.  [name] reported that the patient has minimal fluid intake on a regular basis, a history of malnutrition  and hypoalbuminemia secondary to poor protein intake.   ED course:  Code sepsis activated in ED and blood cultures collected. -On Lawyer, IVF given(1L), Started on Levophed gtt -Given 1 dose Ceftriaxone, Acute kidney injury, LA 1.2 PCCM consulted to admit for sepsis Patient unresponsive/comatose and  ill appearing, Wife was at bedside   CT Head: Increased size of a 10 mm hyperdense lesion in the high posterior left frontal lobe, indeterminate but could reflect interval hemorrhage within a cavernoma or a small tumor/metastasis. No associated edema. Brain MRI without and with contrast could further evaluate. 2. Expected interval evolution of a now chronic right MCA infarct.   MRI Brain: Confirmed small acute on chronic intra-axial hemorrhage at the left middle or superior frontal gyrus, corresponding to the CT finding. No mass effect or complicating features. Favor acute on chronic small vessel disease, as some pre-existing hemosiderin but no convincing cavernoma there on July MRI. 2. No other acute finding. Underlying advanced ischemic disease. Progressed posterior right MCA and right MCA/PCA watershed territory encephalomalacia and hemosiderin since July. Underlying chronic vertebrobasilar atherosclerosis and stenosis.   11/9 patient was stabilized in the ICU, Levophed weaned off, patient was transferred to [facility] service today   Assessment and Plan:   # Sepsis due to UTI and hypovolemic shock S/p IV fluid and Levophed given in the ED and admitted to the ICU. Vital signs improved, Levophed weaned off.  BP improved and it was elevated.  Resumed home medications. S/p Florinef, which has been  discontinued due to elevated blood pressure. S/p ceftriaxone, s/p ciprofloxacin 250 mg p.o. BID. Blood culture NGTD Urine culture 80 K, E faecalis VRE, sensitive to penicillin and nitrofurantoin, resistant to vancomycin.  11/10 started amoxicillin 500 mg p.o. 3 times daily, patient was discharged on oral  amoxicillin 500 p.o. 3 times daily for 5 days.   # Intracerebral hemorrhage CT and MRI brain reports reviewed as above Seen by neurosurgery, recommended repeat CT head in 2 to 4 weeks, hold Coumadin for 2 to 4 weeks.  No surgical intervention at this time.  Follow with neurosurgery in 2 weeks.    # Hypophosphatemia due to nutritional deficiency.  Phos repleted.  Resolved # AKI due to hypovolemic shock.  Resolved S/p IV fluid and Levophed, renal functions improved Creatinine 1.48 --->1.13, AKI resolved. # A-fib, HTN, PAD s/p B/L AKA, CVA Hold Coumadin for 2 to 4 weeks and resume when cleared by neurosurgery after repeating CT head in 2 to 4 weeks Resumed Coreg 6.25 mg p.o. twice daily, lisinopril 15 mg p.o. daily home dose. Resumed Lipitor 10 mg p.o. daily.  Resumed Lasix on discharge.  Discontinued aspirin and Coumadin, resume after clearance from neurosurgery.  Monitor BP at home and follow with PCP to titrate medication accordingly.   # Anemia of chronic disease, hemoglobin 9.4 stable # Thrombocytopenia could be secondary to sepsis, plts 84k stable   Body mass index is 17.61 kg/m.  Nutrition Problem: Severe Malnutrition Etiology: chronic illness (CHF) Nutrition Interventions:  Pressure Injury 03/01/23 Sacrum Mid;Lower Stage 2 -  Partial thickness loss of dermis presenting as a shallow open injury with a red, pink wound bed without slough. 2mm X 2mm (Active)  03/01/23 1615  Location: Sacrum  Location Orientation: Mid;Lower  Staging: Stage 2 -  Partial thickness loss of dermis presenting as a shallow open injury with a red, pink wound bed without slough.  Wound Description (Comments): 2mm X 2mm  Present on Admission: Yes  Dressing Type Foam - Lift dressing to assess site every shift 03/06/23 0941     Patient was seen by physical therapy, who recommended Home health, which was arranged. On the day of the discharge the patient's vitals were stable, and no other acute medical  condition were reported by patient. the patient was felt safe to be discharge at Home with Home health.  Consultants: Neurosurgery, PCCM, Procedures: None  Discharge Exam: General: Appear in no distress, no Rash; Oral Mucosa Clear, moist. Cardiovascular: S1 and S2 Present, no Murmur, Respiratory: normal respiratory effort, Bilateral Air entry present and no Crackles, no wheezes Abdomen: Bowel Sound present, Soft and no tenderness, no hernia Extremities: Bilateral AKA, No edema Neurology: No focal deficits, pleasantly confused, following command. affect appropriate.  Filed Weights   03/03/23 0408 03/04/23 0500 03/06/23 0445  Weight: 68.2 kg 68.9 kg 62.2 kg   Vitals:   03/06/23 0443 03/06/23 0749  BP: 127/87 108/74  Pulse:  (!) 59  Resp: 18 20  Temp: 97.6 F (36.4 C)   SpO2: 100% 100%    DISCHARGE MEDICATION: Allergies as of 03/06/2023   No Known Allergies      Medication List     STOP taking these medications    aspirin EC 81 MG tablet   warfarin 3 MG tablet Commonly known as: COUMADIN       TAKE these medications    amoxicillin 500 MG capsule Commonly known as: AMOXIL Take 1 capsule (500 mg total) by mouth every 8 (eight) hours for 5 days.  ascorbic acid 500 MG tablet Commonly known as: VITAMIN C Take 500 mg by mouth daily.   atorvastatin 10 MG tablet Commonly known as: Lipitor Take 1 tablet (10 mg total) by mouth daily.   carvedilol 3.125 MG tablet Commonly known as: COREG Take 1 tablet (3.125 mg total) by mouth 2 (two) times daily. Hold if SBP <110 mmHg and or HR <65 What changed:  medication strength how much to take additional instructions   cyanocobalamin 1000 MCG tablet Commonly known as: VITAMIN B12 Take 1,000 mcg by mouth daily.   ferrous sulfate 325 (65 FE) MG tablet Take 325 mg by mouth daily with breakfast.   furosemide 20 MG tablet Commonly known as: LASIX Take 20 mg daily by mouth.   gabapentin 100 MG capsule Commonly  known as: NEURONTIN Take 1 capsule (100 mg total) by mouth 3 (three) times daily.   lisinopril 10 MG tablet Commonly known as: ZESTRIL Take 1.5 tablets (15 mg total) by mouth daily. Hold if systolic BP less than 130 mmHg What changed: additional instructions   mirtazapine 7.5 MG tablet Commonly known as: REMERON Take 7.5 mg by mouth at bedtime.               Discharge Care Instructions  (From admission, onward)           Start     Ordered   03/06/23 0000  Discharge wound care:       Comments: As above   03/06/23 1423           No Known Allergies Discharge Instructions     Call MD for:  difficulty breathing, headache or visual disturbances   Complete by: As directed    Call MD for:  extreme fatigue   Complete by: As directed    Call MD for:  persistant dizziness or light-headedness   Complete by: As directed    Call MD for:  persistant nausea and vomiting   Complete by: As directed    Call MD for:  severe uncontrolled pain   Complete by: As directed    Call MD for:  temperature >100.4   Complete by: As directed    Diet - low sodium heart healthy   Complete by: As directed    Discharge instructions   Complete by: As directed    Follow-up with PCP in 1 week, repeat CBC and BMP in 1 week Monitor BP at home and follow parameters taking medications otherwise hold. Follow with PCP to titrate medications accordingly Follow-up with neurosurgery in 2 to 4 weeks to repeat CT head.  Hold Coumadin and aspirin until cleared by neurosurgery to restart.   Discharge wound care:   Complete by: As directed    As above   Increase activity slowly   Complete by: As directed        The results of significant diagnostics from this hospitalization (including imaging, microbiology, ancillary and laboratory) are listed below for reference.    Significant Diagnostic Studies: MR BRAIN WO CONTRAST  Result Date: 03/02/2023 CLINICAL DATA:  82 year old male altered mental  status. Hypotension. Lethargy. 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 New left frontal lobe lesion on CT. EXAM: MRI HEAD WITHOUT CONTRAST TECHNIQUE: Multiplanar, multiecho pulse sequences of the brain and surrounding structures were obtained without intravenous contrast. COMPARISON:  Head CT 03/01/2023.  Brain MRI, CTA 10/26/2022. FINDINGS: Brain: Confirmed small area of intra-axial hemorrhage at the left middle or superior frontal gyrus corresponding to the CT finding (series 9,  image 39 series 10, image 50), stable small volume (estimated 7 mm diameter). Mild adjacent edema or encephalomalacia and mild susceptibility artifact on DWI there. Furthermore, pre-existing blood products here on the July MRI, no T2 signal of a discrete cavernous venous malformation at that time. No larger area of left MCA restricted diffusion. No extra-axial or intraventricular hemorrhage is identified. Compared to 10/26/2022 hemosiderin in the right MCA territory has mildly progressed (series 10, image 45), and there is new hemosiderin in the lateral right occipital pole also (image 29). And encephalomalacia in those areas has progressed. No restricted diffusion elsewhere. No midline shift or ventriculomegaly. 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 Cervicomedullary junction and pituitary are within normal limits. Vascular: Major intracranial vascular flow voids are stable, but chronically abnormal at the vertebrobasilar junction. There are fetal type PCA origins and thus a diminutive proximal vertebrobasilar system. But also abnormal vertebrobasilar atherosclerosis and stenosis was demonstrated by CTA in July. Skull and upper cervical spine: Partially visible cervical spine degeneration. Background bone marrow signal within normal limits. Sinuses/Orbits: Stable. Other: Mastoids are clear. Grossly normal visible internal auditory structures. IMPRESSION: 1. Confirmed small acute on chronic intra-axial hemorrhage at the left middle or  superior frontal gyrus, corresponding to the CT finding. No mass effect or complicating features. Favor acute on chronic small vessel disease, as some pre-existing hemosiderin but no convincing cavernoma there on July MRI. 2. No other acute finding. Underlying advanced ischemic disease. Progressed posterior right MCA and right MCA/PCA watershed territory encephalomalacia and hemosiderin since July. Underlying chronic vertebrobasilar atherosclerosis and stenosis. Electronically Signed   By: Odessa Fleming M.D.   On: 03/02/2023 05:19   CT HEAD WO CONTRAST ( )  Result Date: 03/01/2023 CLINICAL DATA:  Mental status change, unknown cause. Hypotension. Lethargy. EXAM: CT HEAD WITHOUT CONTRAST TECHNIQUE: Contiguous axial images were obtained from the base of the skull through the vertex without intravenous contrast. RADIATION DOSE REDUCTION: This exam was performed according to the departmental dose-optimization program which includes automated exposure control, adjustment of the mA and/or kV according to patient size and/or use of iterative reconstruction technique. COMPARISON:  Head CT and MRI 10/26/2022 FINDINGS: Brain: There is a 10 mm hyperdense cortical/subcortical lesion in the high posterior left frontal lobe which has increased in size and conspicuity compared to the prior CT where in retrospect a more subtly hyperdense 5 mm lesion was present. There was corresponding susceptibility and FLAIR hypointensity in this location on MRI. There is no associated edema. The large right MCA infarct on the previous studies demonstrates expected interval evolution with development of encephalomalacia and mild ex vacuo dilatation of the right lateral ventricle. There is a background of mild chronic small vessel ischemia in the cerebral white matter. Small chronic infarcts are again seen in the right lentiform nucleus and cerebellum. Mild cerebral atrophy is within normal limits for age. No acute large territory infarct, midline  shift, or extra-axial fluid collection is evident. Vascular: Calcified atherosclerosis at the skull base. No hyperdense vessel. Skull: No fracture or suspicious osseous lesion. Sinuses/Orbits: Paranasal sinuses and mastoid air cells are clear. Bilateral cataract extraction. Other: None. These results will be called to the ordering clinician or representative by the Radiologist Assistant, and communication documented in the PACS or [employer]. IMPRESSION: 1. Increased size of a 10 mm hyperdense lesion in the high posterior left frontal lobe, indeterminate but could reflect interval hemorrhage within a cavernoma or a small tumor/metastasis. No associated edema. Brain MRI without and with contrast could further evaluate.  2. Expected interval evolution of a now chronic right MCA infarct. Electronically Signed   By: Sebastian Ache M.D.   On: 03/01/2023 17:42   DG Chest Port 1 View  Result Date: 03/01/2023 CLINICAL DATA:  Questionable sepsis EXAM: PORTABLE CHEST 1 VIEW COMPARISON:  X-ray 10/26/2022. FINDINGS: No consolidation, pneumothorax or effusion. No edema. Normal cardiopericardial silhouette. Calcified tortuous aorta. Overlapping cardiac leads and defibrillator pads. Film is rotated to the left IMPRESSION: No acute cardiopulmonary disease. Electronically Signed   By: Karen Kays M.D.   On: 03/01/2023 15:26    Microbiology: Recent Results (from the past 240 hour(s))  Resp panel by RT-PCR (RSV, Flu A&B, Covid) Anterior Nasal Swab     Status: None   Collection Time: 03/01/23  1:16 PM   Specimen: Anterior Nasal Swab  Result Value Ref Range Status   SARS Coronavirus 2 by RT PCR NEGATIVE NEGATIVE Final    Comment: (NOTE) SARS-CoV-2 target nucleic acids are NOT DETECTED.  The SARS-CoV-2 RNA is generally detectable in upper respiratory specimens during the acute phase of infection. The lowest concentration of SARS-CoV-2 viral copies this assay can detect is 138 copies/mL. A negative result does not  preclude SARS-Cov-2 infection and should not be used as the sole basis for treatment or other patient management decisions. A negative result may occur with  improper specimen collection/handling, submission of specimen other than nasopharyngeal swab, presence of viral mutation(s) within the areas targeted by this assay, and inadequate number of viral copies(<138 copies/mL). A negative result must be combined with clinical observations, patient history, and epidemiological information. The expected result is Negative.  Fact Sheet for Patients:  BloggerCourse.com  Fact Sheet for Healthcare Providers:  SeriousBroker.it  This test is no t yet approved or cleared by the [place] FDA and  has been authorized for detection and/or diagnosis of SARS-CoV-2 by FDA under an Emergency Use Authorization (EUA). This EUA will remain  in effect (meaning this test can be used) for the duration of the COVID-19 declaration under Section 564(b)(1) of the Act, 21 U.S.C.section 360bbb-3(b)(1), unless the authorization is terminated  or revoked sooner.       Influenza A by PCR NEGATIVE NEGATIVE Final   Influenza B by PCR NEGATIVE NEGATIVE Final    Comment: (NOTE) The Xpert Xpress SARS-CoV-2/FLU/RSV plus assay is intended as an aid in the diagnosis of influenza from Nasopharyngeal swab specimens and should not be used as a sole basis for treatment. Nasal washings and aspirates are unacceptable for Xpert Xpress SARS-CoV-2/FLU/RSV testing.  Fact Sheet for Patients: BloggerCourse.com  Fact Sheet for Healthcare Providers: SeriousBroker.it  This test is not yet approved or cleared by the [place] FDA and has been authorized for detection and/or diagnosis of SARS-CoV-2 by FDA under an Emergency Use Authorization (EUA). This EUA will remain in effect (meaning this test can be used) for the  duration of the COVID-19 declaration under Section 564(b)(1) of the Act, 21 U.S.C. section 360bbb-3(b)(1), unless the authorization is terminated or revoked.     Resp Syncytial Virus by PCR NEGATIVE NEGATIVE Final    Comment: (NOTE) Fact Sheet for Patients: BloggerCourse.com  Fact Sheet for Healthcare Providers: SeriousBroker.it  This test is not yet approved or cleared by the [place] FDA and has been authorized for detection and/or diagnosis of SARS-CoV-2 by FDA under an Emergency Use Authorization (EUA). This EUA will remain in effect (meaning this test can be used) for the duration of the COVID-19 declaration under Section 564(b)(1) of the  Act, 21 U.S.C. section 360bbb-3(b)(1), unless the authorization is terminated or revoked.  Performed at [facility], [address] [place], [place] [address]   Blood Culture (routine x 2)     Status: None   Collection Time: 03/01/23  1:16 PM   Specimen: BLOOD  Result Value Ref Range Status   Specimen Description BLOOD BLOOD LEFT HAND  Final   Special Requests   Final    BOTTLES DRAWN AEROBIC AND ANAEROBIC Blood Culture adequate volume   Culture   Final    NO GROWTH 5 DAYS Performed at [facility], [address] [place], [place] [address]    Report Status 03/06/2023 FINAL  Final  Blood Culture (routine x 2)     Status: None   Collection Time: 03/01/23  1:21 PM   Specimen: BLOOD  Result Value Ref Range Status   Specimen Description BLOOD BLOOD RIGHT ARM  Final   Special Requests   Final    BOTTLES DRAWN AEROBIC AND ANAEROBIC Blood Culture results may not be optimal due to an excessive volume of blood received in culture bottles   Culture   Final    NO GROWTH 5 DAYS Performed at [facility], [address] [place], [place] [address]    Report Status 03/06/2023 FINAL  Final  MRSA Next Gen by PCR, Nasal     Status: None   Collection Time:  03/01/23  3:53 PM   Specimen: Nasal Mucosa; Nasal Swab  Result Value Ref Range Status   MRSA by PCR Next Gen NOT DETECTED NOT DETECTED Final    Comment: (NOTE) The GeneXpert MRSA Assay (FDA approved for NASAL specimens only), is one component of a comprehensive MRSA colonization surveillance program. It is not intended to diagnose MRSA infection nor to guide or monitor treatment for MRSA infections. Test performance is not FDA approved in patients less than 21 years old. Performed at [facility], [address] [place], [place] [address]   Urine Culture (for pregnant, neutropenic or urologic patients or patients with an indwelling urinary catheter)     Status: Abnormal   Collection Time: 03/01/23  4:15 PM   Specimen: In/Out Cath Urine  Result Value Ref Range Status   Specimen Description   Final    IN/OUT CATH URINE Performed at [facility], [address] [place], [place] [address]    Special Requests   Final    NONE Performed at [facility], [address] [place], [place] [address]    Culture (A)  Final    80,000 COLONIES/mL ENTEROCOCCUS FAECALIS VANCOMYCIN RESISTANT ENTEROCOCCUS    Report Status 03/04/2023 FINAL  Final   Organism ID, Bacteria ENTEROCOCCUS FAECALIS (A)  Final      Susceptibility   Enterococcus faecalis - MIC*    AMPICILLIN <=2 SENSITIVE Sensitive     NITROFURANTOIN <=16 SENSITIVE Sensitive     VANCOMYCIN >=32 RESISTANT Resistant     * 80,000 COLONIES/mL ENTEROCOCCUS FAECALIS     Labs: CBC: Recent Labs  Lab 03/01/23 1239 03/02/23 0622 03/03/23 0325 03/04/23 0355 03/05/23 0342 03/06/23 0830  WBC 3.6* 3.7* 8.2 7.9 7.6 7.4  NEUTROABS 1.3*  --   --   --   --   --   HGB 11.8* 9.5* 9.1* 8.9* 8.2* 9.4*  HCT 34.1* 28.0* 26.1* 25.1* 23.7* 27.1*  MCV 83.0 84.3 80.8 81.2 82.0 82.6  PLT 110* 95* 107* 93* 83* 84*   Basic Metabolic Panel: Recent Labs  Lab 03/02/23 0622 03/03/23 0325 03/04/23 0355 03/05/23 0342  03/06/23 0830  NA 141 143 144 141 136  K 3.7 3.5 4.0 4.2 5.3*  CL 109 111 113* 113* 102  CO2 26 23 25 24 25   GLUCOSE 116* 89 86 90 113*  BUN 11 15 12 21  27*  CREATININE 1.20 1.16 1.13 1.19 0.94  CALCIUM 8.6* 9.0 8.7* 8.7* 8.4*  MG 1.7 2.2 2.0 2.2 2.2  PHOS 2.0* 2.1* 3.3 2.4* 2.8   Liver Function Tests: Recent Labs  Lab 03/01/23 1239 03/02/23 0622 03/03/23 0325  AST 26  --   --   ALT 22  --   --   ALKPHOS 71  --   --   BILITOT 1.1  --   --   PROT 7.0  --   --   ALBUMIN 3.0* 2.5* 2.7*   No results for input(s): "LIPASE", "AMYLASE" in the last 168 hours. No results for input(s): "AMMONIA" in the last 168 hours. Cardiac Enzymes: No results for input(s): "CKTOTAL", "CKMB", "CKMBINDEX", "TROPONINI" in the last 168 hours. BNP (last 3 results) Recent Labs    11/20/22 1239  BNP 94.1   CBG: Recent Labs  Lab 03/05/23 2005 03/06/23 0017 03/06/23 0446 03/06/23 0811 03/06/23 1216  GLUCAP 76 83 77 95 133*    Time spent: 35 minutes  Signed:  Gillis Santa  [facility] 03/06/2023 2:24 PM

## 2023-03-06 NOTE — Consult Note (Signed)
[facility] Liaison Note  03/06/2023  [name] [name] 10-28-1940 [id number]  Location: RN Hospital Liaison screened the patient remotely at [facility].  Insurance: [facility]   [name] is a 82 y.o. male who is a Primary Care Patient of Luciana Axe, NP-[facility]. The patient was screened for  readmission hospitalization with noted extreme risk score for unplanned readmission risk with 3 IP in 6 months.  The patient was assessed for potential Care Management service needs for post hospital transition for care coordination. Review of patient's electronic medical record reveals patient was admitted with Shock. Documentation indicates this pt is active with CCM with his provider's office. Provider's office is also responsible for the Transition of care call post hospital discharge. No anticipated needs for VBCI to address at this time.  VBCI Care Management/Population Health does not replace or interfere with any arrangements made by the Inpatient Transition of Care team.   For questions contact:   Elliot Cousin, RN, [facility] Liaison [place]   [facility], Population Health Office Hours MTWF  8:00 am-6:00 pm Direct Dial: [phone] mobile [phone] [Office toll free line] Office Hours are M-F 8:30 - 5 pm [email or web address]

## 2023-03-09 DIAGNOSIS — I130 Hypertensive heart and chronic kidney disease with heart failure and stage 1 through stage 4 chronic kidney disease, or unspecified chronic kidney disease: Secondary | ICD-10-CM | POA: Diagnosis not present

## 2023-03-09 DIAGNOSIS — I5032 Chronic diastolic (congestive) heart failure: Secondary | ICD-10-CM | POA: Diagnosis not present

## 2023-03-09 DIAGNOSIS — N182 Chronic kidney disease, stage 2 (mild): Secondary | ICD-10-CM | POA: Diagnosis not present

## 2023-03-09 DIAGNOSIS — S6992XD Unspecified injury of left wrist, hand and finger(s), subsequent encounter: Secondary | ICD-10-CM | POA: Diagnosis not present

## 2023-03-09 DIAGNOSIS — I69398 Other sequelae of cerebral infarction: Secondary | ICD-10-CM | POA: Diagnosis not present

## 2023-03-09 DIAGNOSIS — E1122 Type 2 diabetes mellitus with diabetic chronic kidney disease: Secondary | ICD-10-CM | POA: Diagnosis not present

## 2023-03-09 DIAGNOSIS — S59902D Unspecified injury of left elbow, subsequent encounter: Secondary | ICD-10-CM | POA: Diagnosis not present

## 2023-03-09 DIAGNOSIS — M6281 Muscle weakness (generalized): Secondary | ICD-10-CM | POA: Diagnosis not present

## 2023-03-09 DIAGNOSIS — D631 Anemia in chronic kidney disease: Secondary | ICD-10-CM | POA: Diagnosis not present

## 2023-03-12 IMAGING — CR DG CHEST 2V
2 series · 2 of 2 positions shown · non-contrast
Comparison: 02/22/2012

CLINICAL DATA: Preop for hyperbaric oxygen treatment. Nonhealing
right lower extremity wound.

EXAM:
CHEST - 2 VIEW

[chest lat]
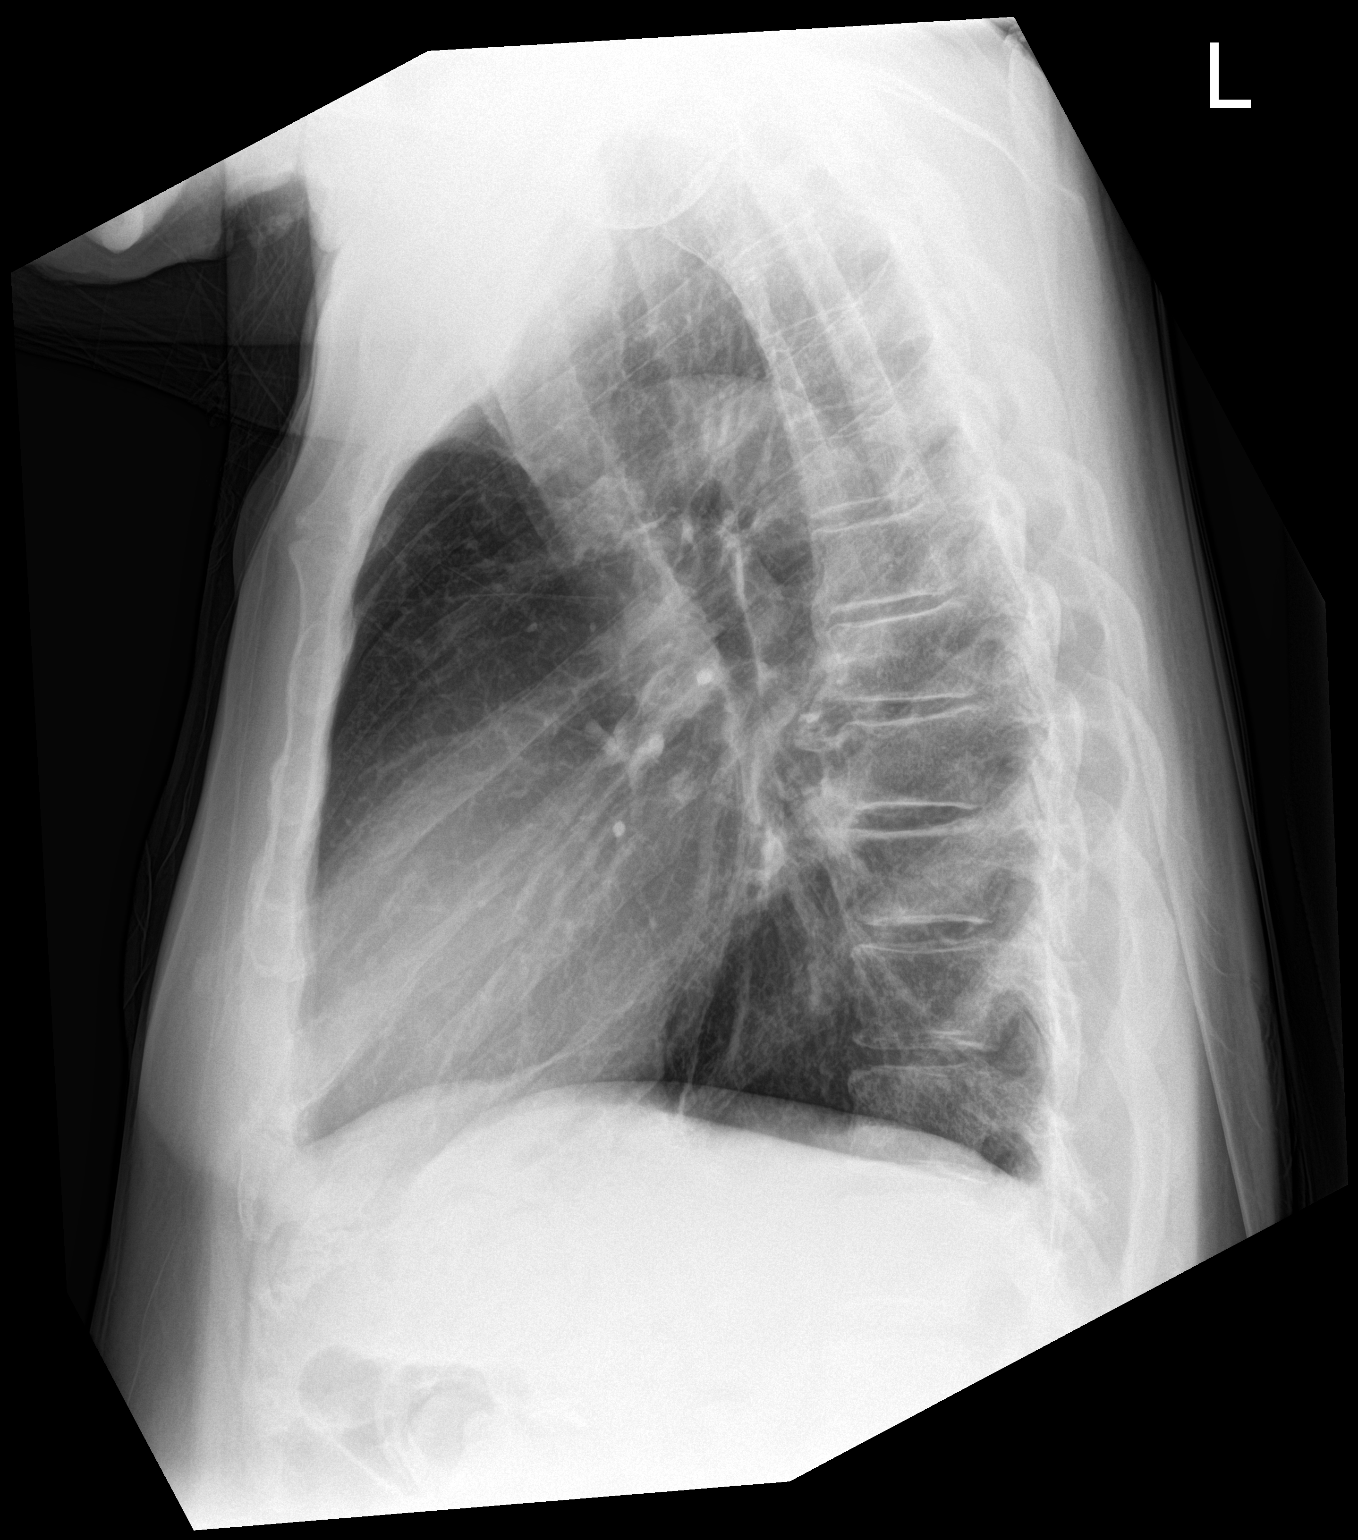

[chest ap]
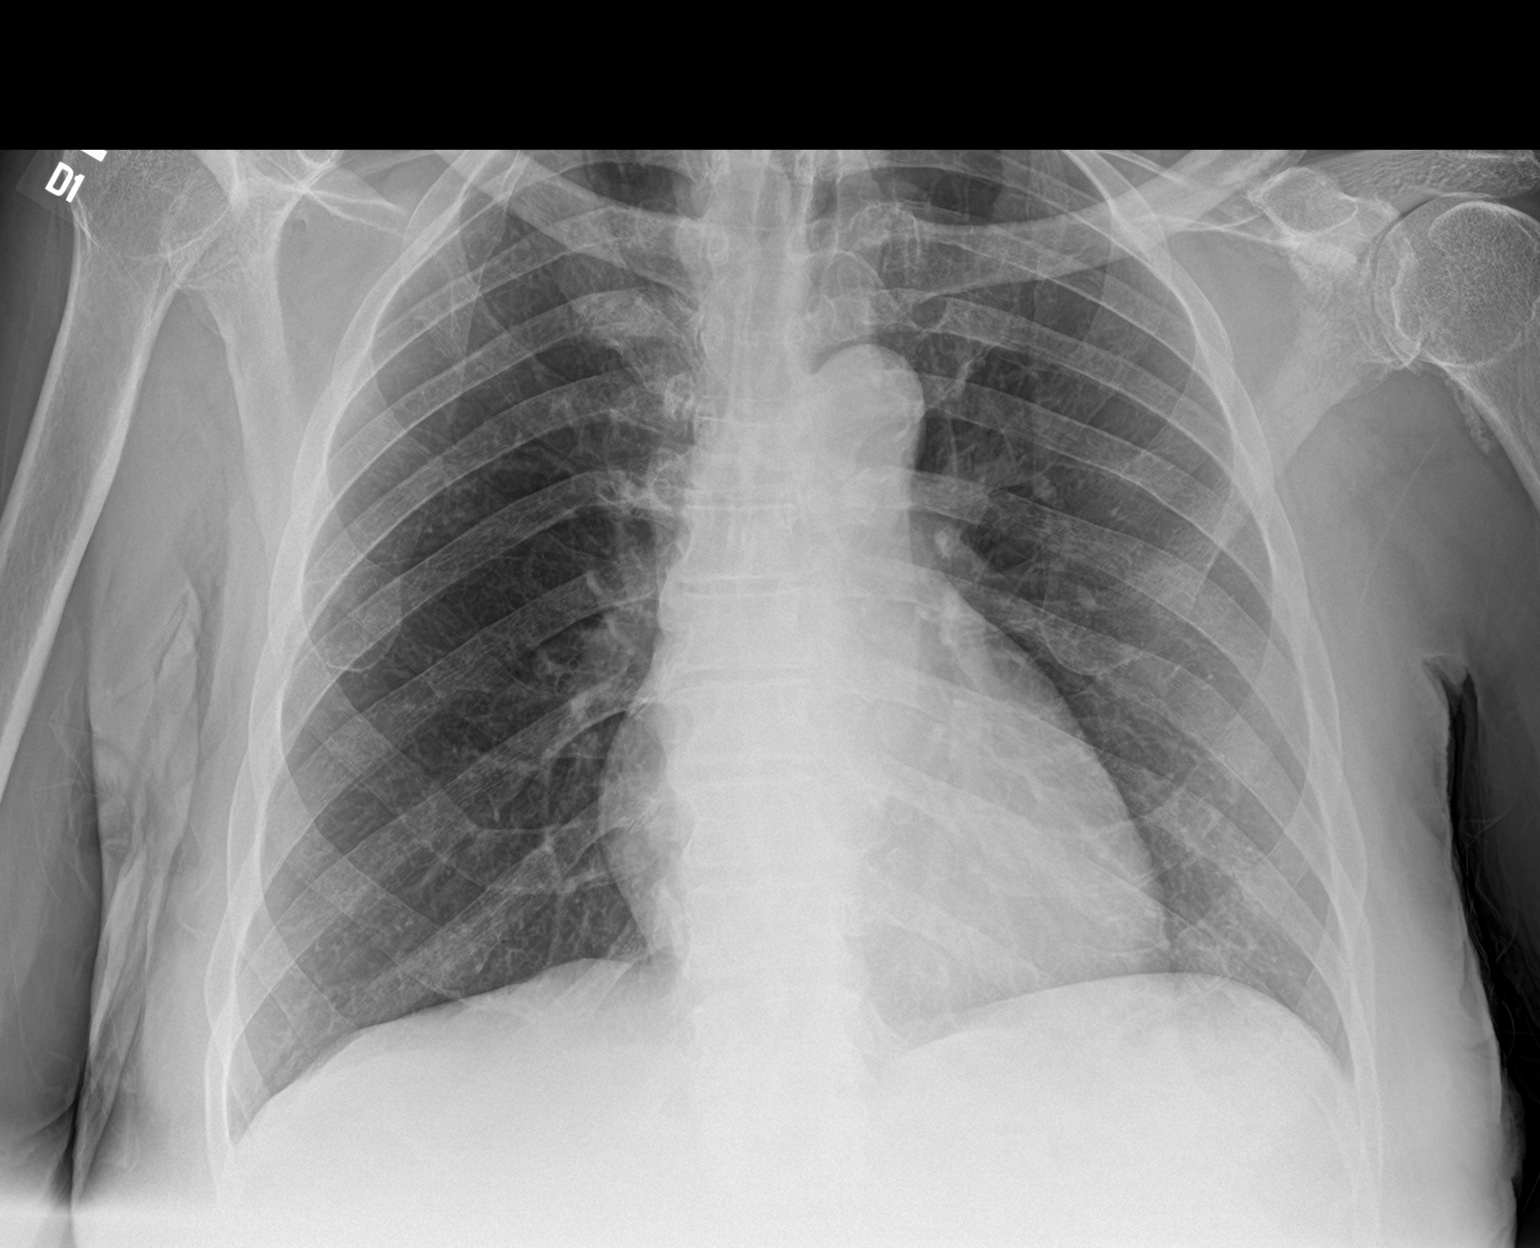

[2 of 2 positions shown; findings below may reference images not displayed]

FINDINGS: The heart size and mediastinal contours are within normal limits.
Both lungs are clear. The visualized skeletal structures are
unremarkable.
IMPRESSION: No active cardiopulmonary disease.

## 2023-03-13 DIAGNOSIS — N182 Chronic kidney disease, stage 2 (mild): Secondary | ICD-10-CM | POA: Diagnosis not present

## 2023-03-13 DIAGNOSIS — S59902D Unspecified injury of left elbow, subsequent encounter: Secondary | ICD-10-CM | POA: Diagnosis not present

## 2023-03-13 DIAGNOSIS — I69398 Other sequelae of cerebral infarction: Secondary | ICD-10-CM | POA: Diagnosis not present

## 2023-03-13 DIAGNOSIS — I130 Hypertensive heart and chronic kidney disease with heart failure and stage 1 through stage 4 chronic kidney disease, or unspecified chronic kidney disease: Secondary | ICD-10-CM | POA: Diagnosis not present

## 2023-03-13 DIAGNOSIS — M6281 Muscle weakness (generalized): Secondary | ICD-10-CM | POA: Diagnosis not present

## 2023-03-13 DIAGNOSIS — I5032 Chronic diastolic (congestive) heart failure: Secondary | ICD-10-CM | POA: Diagnosis not present

## 2023-03-13 DIAGNOSIS — D631 Anemia in chronic kidney disease: Secondary | ICD-10-CM | POA: Diagnosis not present

## 2023-03-13 DIAGNOSIS — E1122 Type 2 diabetes mellitus with diabetic chronic kidney disease: Secondary | ICD-10-CM | POA: Diagnosis not present

## 2023-03-13 DIAGNOSIS — S6992XD Unspecified injury of left wrist, hand and finger(s), subsequent encounter: Secondary | ICD-10-CM | POA: Diagnosis not present

## 2023-03-14 DIAGNOSIS — Z8679 Personal history of other diseases of the circulatory system: Secondary | ICD-10-CM | POA: Diagnosis not present

## 2023-03-14 DIAGNOSIS — L8990 Pressure ulcer of unspecified site, unspecified stage: Secondary | ICD-10-CM | POA: Diagnosis not present

## 2023-03-14 DIAGNOSIS — L89152 Pressure ulcer of sacral region, stage 2: Secondary | ICD-10-CM | POA: Diagnosis not present

## 2023-03-14 DIAGNOSIS — I629 Nontraumatic intracranial hemorrhage, unspecified: Secondary | ICD-10-CM | POA: Diagnosis not present

## 2023-03-14 DIAGNOSIS — R579 Shock, unspecified: Secondary | ICD-10-CM | POA: Diagnosis not present

## 2023-03-14 DIAGNOSIS — Z89612 Acquired absence of left leg above knee: Secondary | ICD-10-CM | POA: Diagnosis not present

## 2023-03-14 DIAGNOSIS — E43 Unspecified severe protein-calorie malnutrition: Secondary | ICD-10-CM | POA: Diagnosis not present

## 2023-03-14 DIAGNOSIS — Z89611 Acquired absence of right leg above knee: Secondary | ICD-10-CM | POA: Diagnosis not present

## 2023-03-14 DIAGNOSIS — I429 Cardiomyopathy, unspecified: Secondary | ICD-10-CM | POA: Diagnosis not present

## 2023-03-14 DIAGNOSIS — I739 Peripheral vascular disease, unspecified: Secondary | ICD-10-CM | POA: Diagnosis not present

## 2023-03-14 DIAGNOSIS — Z09 Encounter for follow-up examination after completed treatment for conditions other than malignant neoplasm: Secondary | ICD-10-CM | POA: Diagnosis not present

## 2023-03-15 DIAGNOSIS — Z89611 Acquired absence of right leg above knee: Secondary | ICD-10-CM | POA: Diagnosis not present

## 2023-03-15 DIAGNOSIS — I130 Hypertensive heart and chronic kidney disease with heart failure and stage 1 through stage 4 chronic kidney disease, or unspecified chronic kidney disease: Secondary | ICD-10-CM | POA: Diagnosis not present

## 2023-03-15 DIAGNOSIS — Z89612 Acquired absence of left leg above knee: Secondary | ICD-10-CM | POA: Diagnosis not present

## 2023-03-15 DIAGNOSIS — N179 Acute kidney failure, unspecified: Secondary | ICD-10-CM | POA: Diagnosis not present

## 2023-03-15 DIAGNOSIS — I69198 Other sequelae of nontraumatic intracerebral hemorrhage: Secondary | ICD-10-CM | POA: Diagnosis not present

## 2023-03-15 DIAGNOSIS — N171 Acute kidney failure with acute cortical necrosis: Secondary | ICD-10-CM | POA: Diagnosis not present

## 2023-03-15 DIAGNOSIS — B952 Enterococcus as the cause of diseases classified elsewhere: Secondary | ICD-10-CM | POA: Diagnosis not present

## 2023-03-15 DIAGNOSIS — I110 Hypertensive heart disease with heart failure: Secondary | ICD-10-CM | POA: Diagnosis not present

## 2023-03-15 DIAGNOSIS — I5032 Chronic diastolic (congestive) heart failure: Secondary | ICD-10-CM | POA: Diagnosis not present

## 2023-03-15 DIAGNOSIS — Z8673 Personal history of transient ischemic attack (TIA), and cerebral infarction without residual deficits: Secondary | ICD-10-CM | POA: Diagnosis not present

## 2023-03-15 DIAGNOSIS — M6281 Muscle weakness (generalized): Secondary | ICD-10-CM | POA: Diagnosis not present

## 2023-03-15 DIAGNOSIS — N390 Urinary tract infection, site not specified: Secondary | ICD-10-CM | POA: Diagnosis not present

## 2023-03-15 DIAGNOSIS — A419 Sepsis, unspecified organism: Secondary | ICD-10-CM | POA: Diagnosis not present

## 2023-03-15 DIAGNOSIS — Z1621 Resistance to vancomycin: Secondary | ICD-10-CM | POA: Diagnosis not present

## 2023-03-15 DIAGNOSIS — E1169 Type 2 diabetes mellitus with other specified complication: Secondary | ICD-10-CM | POA: Diagnosis not present

## 2023-03-15 DIAGNOSIS — L89152 Pressure ulcer of sacral region, stage 2: Secondary | ICD-10-CM | POA: Diagnosis not present

## 2023-03-16 DIAGNOSIS — I5032 Chronic diastolic (congestive) heart failure: Secondary | ICD-10-CM | POA: Diagnosis not present

## 2023-03-16 DIAGNOSIS — L89152 Pressure ulcer of sacral region, stage 2: Secondary | ICD-10-CM | POA: Diagnosis not present

## 2023-03-16 DIAGNOSIS — Z1621 Resistance to vancomycin: Secondary | ICD-10-CM | POA: Diagnosis not present

## 2023-03-16 DIAGNOSIS — I130 Hypertensive heart and chronic kidney disease with heart failure and stage 1 through stage 4 chronic kidney disease, or unspecified chronic kidney disease: Secondary | ICD-10-CM | POA: Diagnosis not present

## 2023-03-16 DIAGNOSIS — M6281 Muscle weakness (generalized): Secondary | ICD-10-CM | POA: Diagnosis not present

## 2023-03-16 DIAGNOSIS — B952 Enterococcus as the cause of diseases classified elsewhere: Secondary | ICD-10-CM | POA: Diagnosis not present

## 2023-03-16 DIAGNOSIS — A419 Sepsis, unspecified organism: Secondary | ICD-10-CM | POA: Diagnosis not present

## 2023-03-16 DIAGNOSIS — I69198 Other sequelae of nontraumatic intracerebral hemorrhage: Secondary | ICD-10-CM | POA: Diagnosis not present

## 2023-03-16 DIAGNOSIS — N390 Urinary tract infection, site not specified: Secondary | ICD-10-CM | POA: Diagnosis not present

## 2023-03-19 DIAGNOSIS — M6281 Muscle weakness (generalized): Secondary | ICD-10-CM | POA: Diagnosis not present

## 2023-03-19 DIAGNOSIS — I69198 Other sequelae of nontraumatic intracerebral hemorrhage: Secondary | ICD-10-CM | POA: Diagnosis not present

## 2023-03-19 DIAGNOSIS — I5032 Chronic diastolic (congestive) heart failure: Secondary | ICD-10-CM | POA: Diagnosis not present

## 2023-03-19 DIAGNOSIS — I130 Hypertensive heart and chronic kidney disease with heart failure and stage 1 through stage 4 chronic kidney disease, or unspecified chronic kidney disease: Secondary | ICD-10-CM | POA: Diagnosis not present

## 2023-03-19 DIAGNOSIS — L89152 Pressure ulcer of sacral region, stage 2: Secondary | ICD-10-CM | POA: Diagnosis not present

## 2023-03-19 DIAGNOSIS — N390 Urinary tract infection, site not specified: Secondary | ICD-10-CM | POA: Diagnosis not present

## 2023-03-19 DIAGNOSIS — Z1621 Resistance to vancomycin: Secondary | ICD-10-CM | POA: Diagnosis not present

## 2023-03-19 DIAGNOSIS — B952 Enterococcus as the cause of diseases classified elsewhere: Secondary | ICD-10-CM | POA: Diagnosis not present

## 2023-03-19 DIAGNOSIS — A419 Sepsis, unspecified organism: Secondary | ICD-10-CM | POA: Diagnosis not present

## 2023-03-21 DIAGNOSIS — N390 Urinary tract infection, site not specified: Secondary | ICD-10-CM | POA: Diagnosis not present

## 2023-03-21 DIAGNOSIS — E1142 Type 2 diabetes mellitus with diabetic polyneuropathy: Secondary | ICD-10-CM | POA: Diagnosis not present

## 2023-03-21 DIAGNOSIS — Z1621 Resistance to vancomycin: Secondary | ICD-10-CM | POA: Diagnosis not present

## 2023-03-21 DIAGNOSIS — I69198 Other sequelae of nontraumatic intracerebral hemorrhage: Secondary | ICD-10-CM | POA: Diagnosis not present

## 2023-03-21 DIAGNOSIS — A419 Sepsis, unspecified organism: Secondary | ICD-10-CM | POA: Diagnosis not present

## 2023-03-21 DIAGNOSIS — I5032 Chronic diastolic (congestive) heart failure: Secondary | ICD-10-CM | POA: Diagnosis not present

## 2023-03-21 DIAGNOSIS — I739 Peripheral vascular disease, unspecified: Secondary | ICD-10-CM | POA: Diagnosis not present

## 2023-03-21 DIAGNOSIS — L89152 Pressure ulcer of sacral region, stage 2: Secondary | ICD-10-CM | POA: Diagnosis not present

## 2023-03-21 DIAGNOSIS — M6281 Muscle weakness (generalized): Secondary | ICD-10-CM | POA: Diagnosis not present

## 2023-03-21 DIAGNOSIS — I4891 Unspecified atrial fibrillation: Secondary | ICD-10-CM | POA: Diagnosis not present

## 2023-03-21 DIAGNOSIS — B952 Enterococcus as the cause of diseases classified elsewhere: Secondary | ICD-10-CM | POA: Diagnosis not present

## 2023-03-21 DIAGNOSIS — I130 Hypertensive heart and chronic kidney disease with heart failure and stage 1 through stage 4 chronic kidney disease, or unspecified chronic kidney disease: Secondary | ICD-10-CM | POA: Diagnosis not present

## 2023-03-21 DIAGNOSIS — I10 Essential (primary) hypertension: Secondary | ICD-10-CM | POA: Diagnosis not present

## 2023-03-21 DIAGNOSIS — E785 Hyperlipidemia, unspecified: Secondary | ICD-10-CM | POA: Diagnosis not present

## 2023-03-26 IMAGING — CR DG CHEST 2V
1 series · 2 of 2 positions shown · non-contrast
Comparison: 02/22/2021.

CLINICAL DATA: Suspected sepsis

EXAM:
CHEST - 2 VIEW

[Series 1: dg chest 2 view · 0.14mm/px · 2 of 2 slices shown]
[im 1/2]
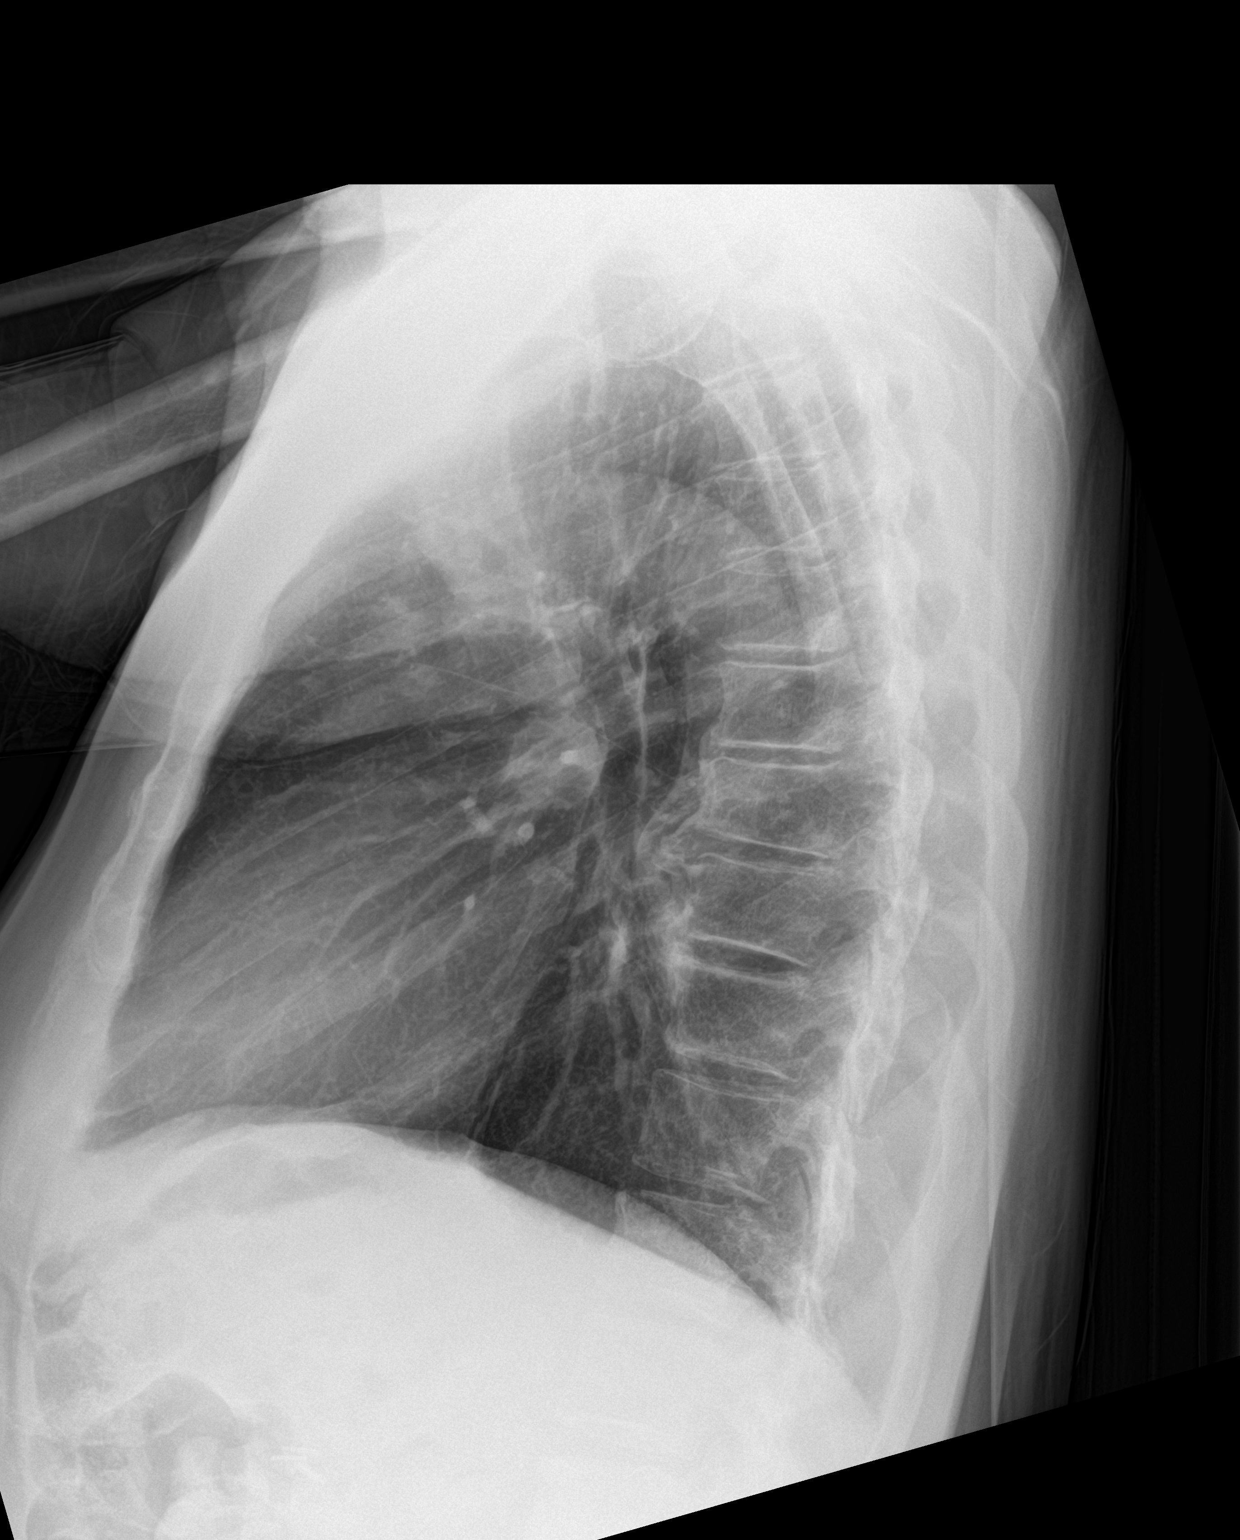
[im 2/2]
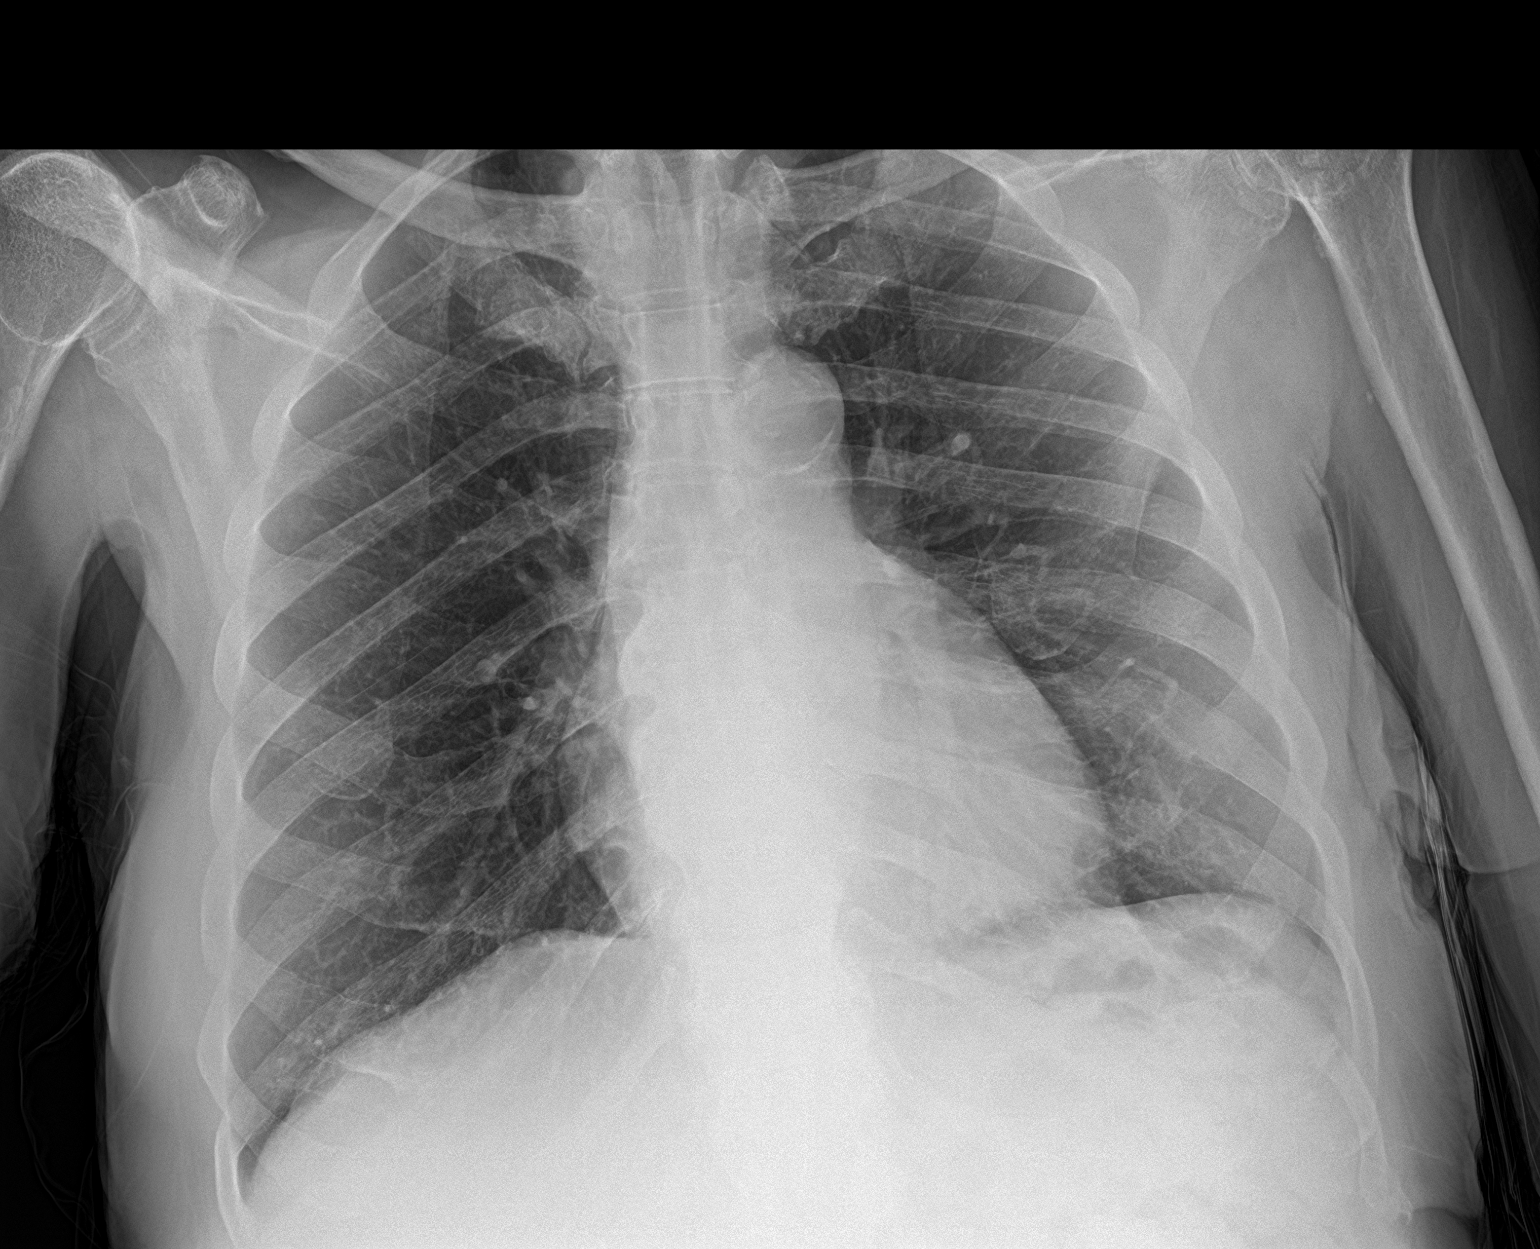

[2 of 2 positions shown; findings below may reference images not displayed]

FINDINGS: Cardiac and mediastinal contours are within normal limits. Aortic
atherosclerosis. No focal pulmonary opacity. No pleural effusion or
pneumothorax. No acute osseous abnormality.
IMPRESSION: No acute cardiopulmonary process.

Aortic Atherosclerosis ([id number]).

## 2023-03-26 IMAGING — CR DG ANKLE COMPLETE 3+V*R*
1 series · 3 of 3 positions shown · non-contrast
Comparison: February 08, 2021.

CLINICAL DATA: Suspected Sepsis

EXAM:
RIGHT ANKLE - COMPLETE 3+ VIEW

[Series 1: dg ankle complete right · 0.14mm/px · 3 of 3 slices shown]
[im 1/3]
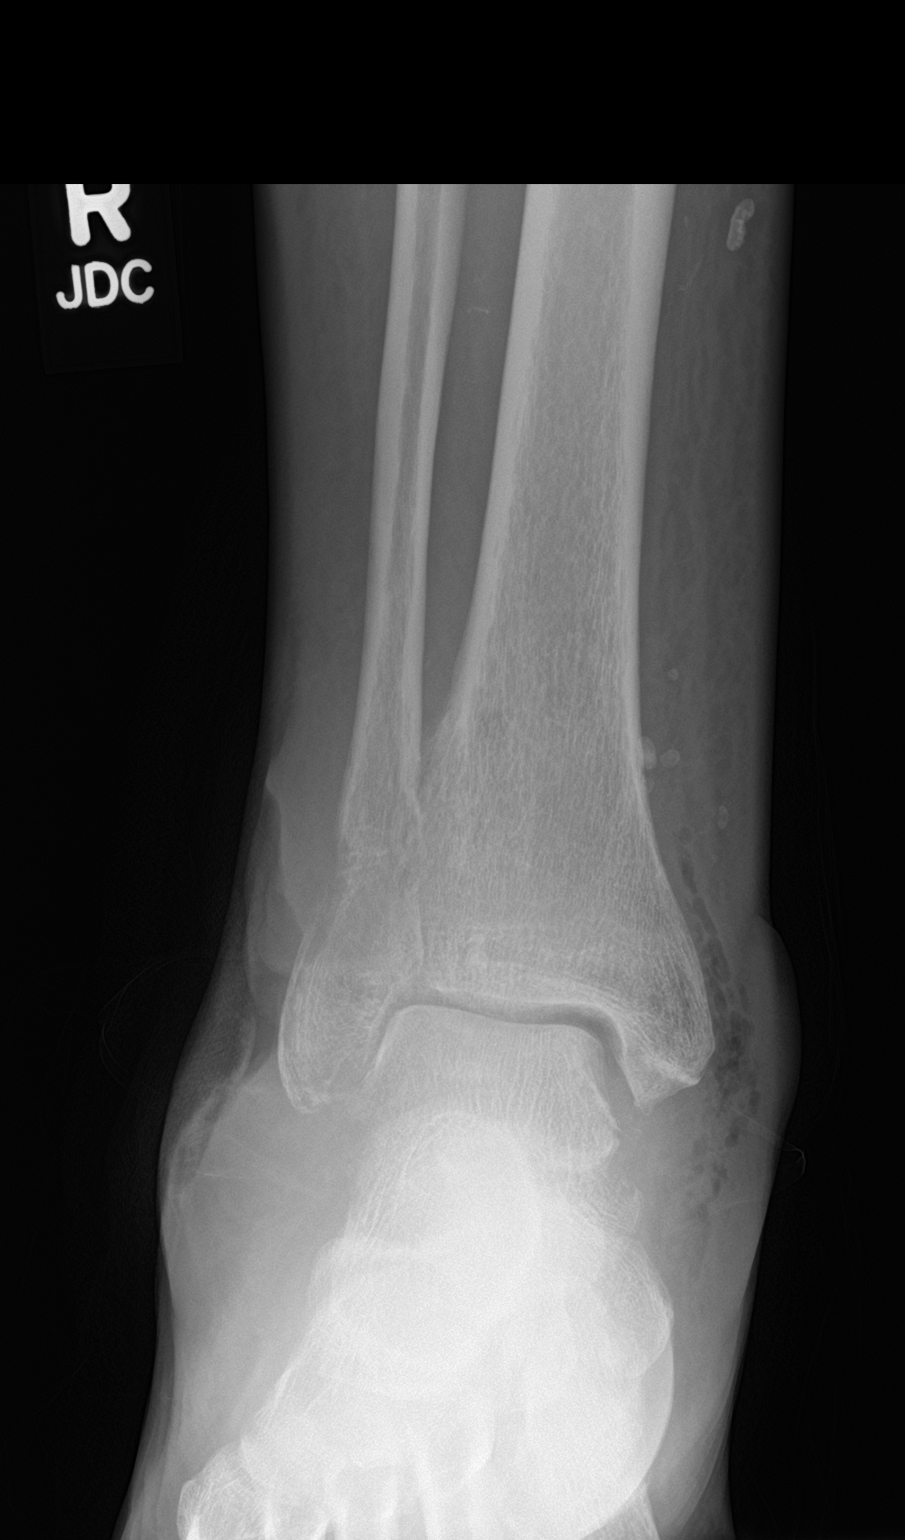
[im 2/3]
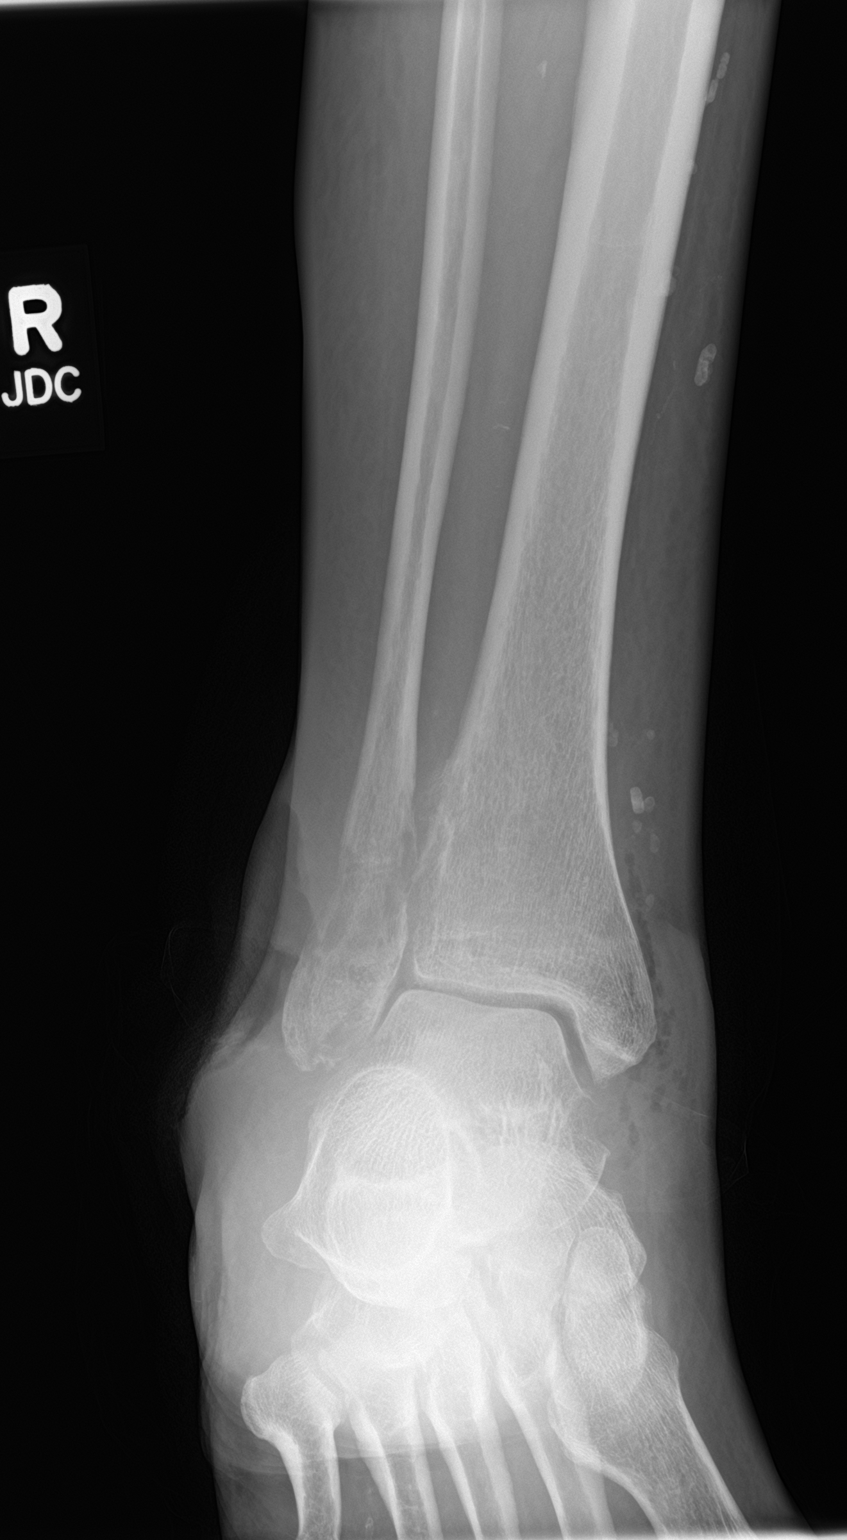
[im 3/3]
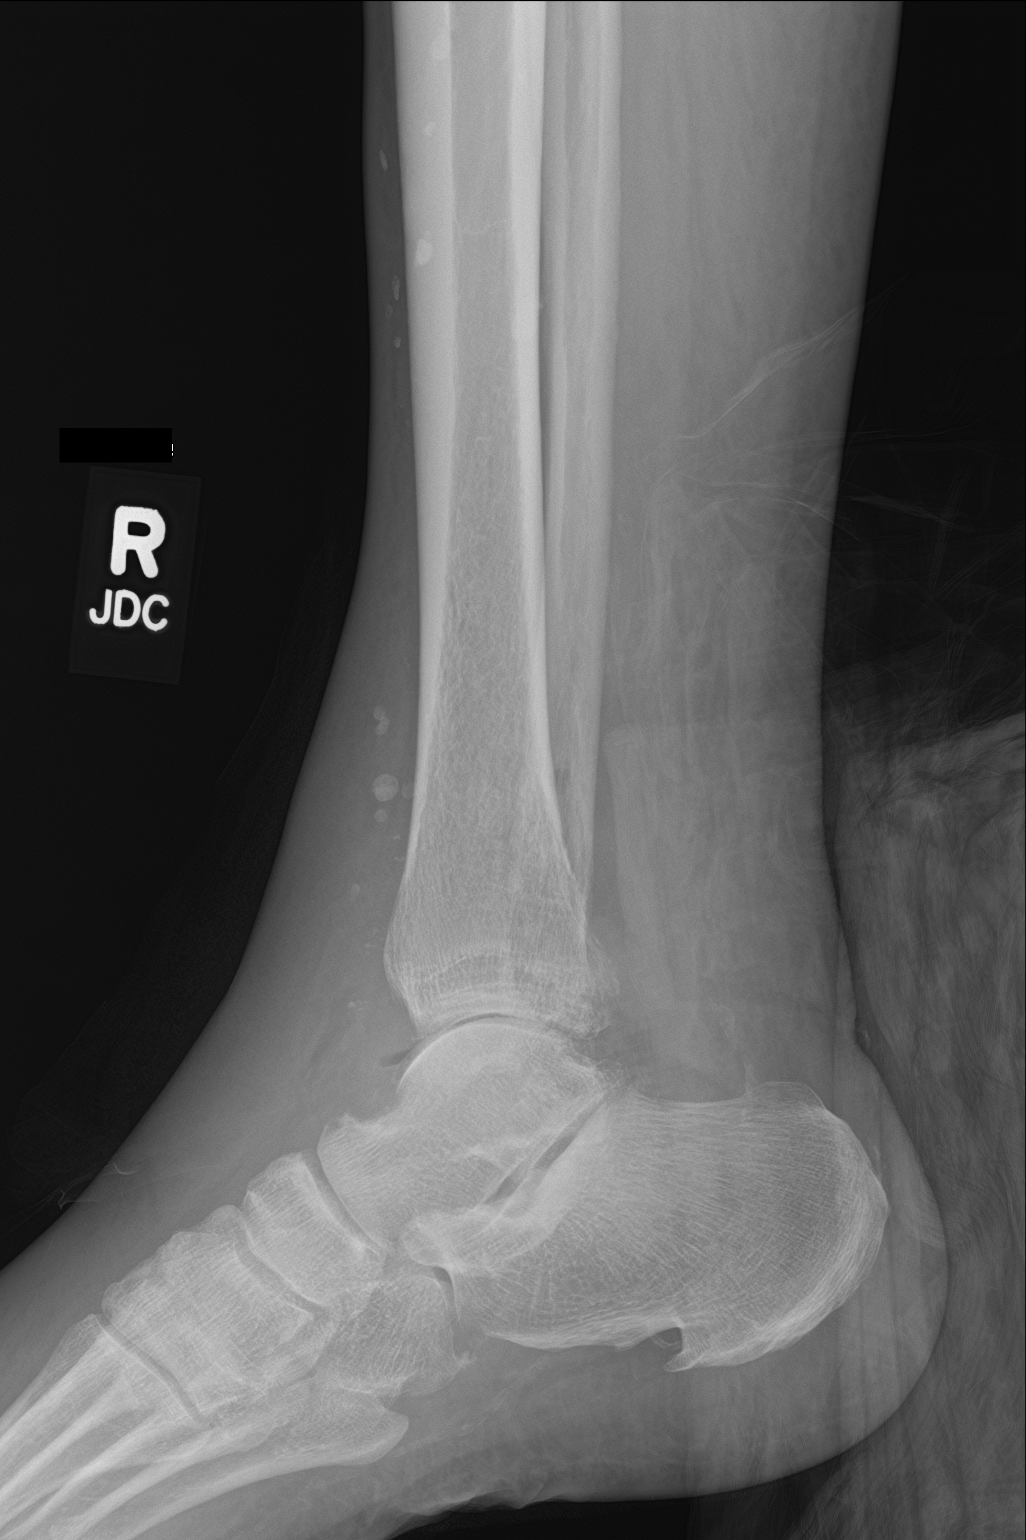

[3 of 3 positions shown; findings below may reference images not displayed]

FINDINGS: Large lateral ulcer at the ankle with erosive change in the
subjacent distal fibula, compatible with osteomyelitis.
Intra-articular gas at the anterior tibiotalar joint. Extensive soft
tissue swelling at the ankle with gas medially. No evidence of acute
fracture. No joint malalignment.
IMPRESSION: 1. Large lateral ulcer at the ankle with extensive erosive change
and osteopenia in the subjacent distal fibula and the lateral talus,
compatible with osteomyelitis. Intra-articular gas at the anterior
tibiotalar joint is concerning for direct communication/septic
arthritis.
2. Extensive soft tissue swelling at the ankle with gas medially,
concerning for cellulitis with gas-forming organism.

## 2023-03-27 ENCOUNTER — Ambulatory Visit: Payer: Medicare HMO

## 2023-03-28 DIAGNOSIS — I69198 Other sequelae of nontraumatic intracerebral hemorrhage: Secondary | ICD-10-CM | POA: Diagnosis not present

## 2023-03-28 DIAGNOSIS — I5032 Chronic diastolic (congestive) heart failure: Secondary | ICD-10-CM | POA: Diagnosis not present

## 2023-03-28 DIAGNOSIS — L89152 Pressure ulcer of sacral region, stage 2: Secondary | ICD-10-CM | POA: Diagnosis not present

## 2023-03-28 DIAGNOSIS — N390 Urinary tract infection, site not specified: Secondary | ICD-10-CM | POA: Diagnosis not present

## 2023-03-28 DIAGNOSIS — I130 Hypertensive heart and chronic kidney disease with heart failure and stage 1 through stage 4 chronic kidney disease, or unspecified chronic kidney disease: Secondary | ICD-10-CM | POA: Diagnosis not present

## 2023-03-28 DIAGNOSIS — M6281 Muscle weakness (generalized): Secondary | ICD-10-CM | POA: Diagnosis not present

## 2023-03-28 DIAGNOSIS — B952 Enterococcus as the cause of diseases classified elsewhere: Secondary | ICD-10-CM | POA: Diagnosis not present

## 2023-03-28 DIAGNOSIS — A419 Sepsis, unspecified organism: Secondary | ICD-10-CM | POA: Diagnosis not present

## 2023-03-28 DIAGNOSIS — Z1621 Resistance to vancomycin: Secondary | ICD-10-CM | POA: Diagnosis not present

## 2023-03-29 DIAGNOSIS — I69198 Other sequelae of nontraumatic intracerebral hemorrhage: Secondary | ICD-10-CM | POA: Diagnosis not present

## 2023-03-29 DIAGNOSIS — M6281 Muscle weakness (generalized): Secondary | ICD-10-CM | POA: Diagnosis not present

## 2023-03-29 DIAGNOSIS — Z1621 Resistance to vancomycin: Secondary | ICD-10-CM | POA: Diagnosis not present

## 2023-03-29 DIAGNOSIS — I5032 Chronic diastolic (congestive) heart failure: Secondary | ICD-10-CM | POA: Diagnosis not present

## 2023-03-29 DIAGNOSIS — B952 Enterococcus as the cause of diseases classified elsewhere: Secondary | ICD-10-CM | POA: Diagnosis not present

## 2023-03-29 DIAGNOSIS — A419 Sepsis, unspecified organism: Secondary | ICD-10-CM | POA: Diagnosis not present

## 2023-03-29 DIAGNOSIS — I130 Hypertensive heart and chronic kidney disease with heart failure and stage 1 through stage 4 chronic kidney disease, or unspecified chronic kidney disease: Secondary | ICD-10-CM | POA: Diagnosis not present

## 2023-03-29 DIAGNOSIS — N390 Urinary tract infection, site not specified: Secondary | ICD-10-CM | POA: Diagnosis not present

## 2023-03-29 DIAGNOSIS — L89152 Pressure ulcer of sacral region, stage 2: Secondary | ICD-10-CM | POA: Diagnosis not present

## 2023-04-02 ENCOUNTER — Other Ambulatory Visit: Payer: Self-pay | Admitting: Neurosurgery

## 2023-04-02 ENCOUNTER — Ambulatory Visit
Admission: RE | Admit: 2023-04-02 | Discharge: 2023-04-02 | Disposition: A | Discharge status: Home / Self Care | Payer: Medicare HMO | Source: Ambulatory Visit | Attending: Neurosurgery | Admitting: Neurosurgery

## 2023-04-02 DIAGNOSIS — I611 Nontraumatic intracerebral hemorrhage in hemisphere, cortical: Secondary | ICD-10-CM | POA: Diagnosis not present

## 2023-04-02 DIAGNOSIS — G9389 Other specified disorders of brain: Secondary | ICD-10-CM | POA: Diagnosis not present

## 2023-04-02 DIAGNOSIS — I629 Nontraumatic intracranial hemorrhage, unspecified: Secondary | ICD-10-CM

## 2023-04-02 DIAGNOSIS — I6782 Cerebral ischemia: Secondary | ICD-10-CM | POA: Diagnosis not present

## 2023-04-02 DIAGNOSIS — G936 Cerebral edema: Secondary | ICD-10-CM | POA: Diagnosis not present

## 2023-04-02 NOTE — Progress Notes (Signed)
As outlined above, CT scan was ordered and resulted.  Currently has follow-up planned in neurosurgery.  Radiology read suggest an MRI with and without contrast.  Order has been placed.   Narrative & Impression  CLINICAL DATA:  Intracranial hemorrhage.   EXAM: CT HEAD WITHOUT CONTRAST   TECHNIQUE: Contiguous axial images were obtained from the base of the skull through the vertex without intravenous contrast.   RADIATION DOSE REDUCTION: This exam was performed according to the departmental dose-optimization program which includes automated exposure control, adjustment of the mA and/or kV according to patient size and/or use of iterative reconstruction technique.   COMPARISON:  Head CT and MRI 03/01/2023   FINDINGS: Brain: Compared to the prior CT, a small focus of hyperdense hemorrhage in the high posterior left frontal lobe has decreased in size, currently measuring 4 mm. However, there is mild to moderate vasogenic edema extending from this area inferiorly into the centrum semiovale which has greatly increased without significant mass effect. No new intracranial hemorrhage is identified elsewhere. 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 Small chronic infarcts are again noted in the right basal ganglia and cerebellum, and there is a background of mild chronic small vessel ischemia in the cerebral white matter. No acute cortically based infarct, midline shift, or extra-axial fluid collection is evident. Mild cerebral atrophy is within normal limits for age.   Vascular: Calcified atherosclerosis at the skull base.   Skull: No acute fracture or suspicious osseous lesion.   Sinuses/Orbits: The included paranasal sinuses and mastoid air cells are clear. Bilateral cataract extraction.   Other: None.   IMPRESSION: 1. Decreased size of a small left frontal lobe hemorrhage, however regional edema has  significantly worsened raising concern for an underlying lesion or other active process. Consider obtaining a repeat head MRI with contrast. 2. 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     Electronically Signed   By: Sebastian Ache M.D.   On: 04/02/2023 15:02

## 2023-04-03 DIAGNOSIS — B952 Enterococcus as the cause of diseases classified elsewhere: Secondary | ICD-10-CM | POA: Diagnosis not present

## 2023-04-03 DIAGNOSIS — L89152 Pressure ulcer of sacral region, stage 2: Secondary | ICD-10-CM | POA: Diagnosis not present

## 2023-04-03 DIAGNOSIS — I69198 Other sequelae of nontraumatic intracerebral hemorrhage: Secondary | ICD-10-CM | POA: Diagnosis not present

## 2023-04-03 DIAGNOSIS — I130 Hypertensive heart and chronic kidney disease with heart failure and stage 1 through stage 4 chronic kidney disease, or unspecified chronic kidney disease: Secondary | ICD-10-CM | POA: Diagnosis not present

## 2023-04-03 DIAGNOSIS — N390 Urinary tract infection, site not specified: Secondary | ICD-10-CM | POA: Diagnosis not present

## 2023-04-03 DIAGNOSIS — I5032 Chronic diastolic (congestive) heart failure: Secondary | ICD-10-CM | POA: Diagnosis not present

## 2023-04-03 DIAGNOSIS — A419 Sepsis, unspecified organism: Secondary | ICD-10-CM | POA: Diagnosis not present

## 2023-04-03 DIAGNOSIS — Z1621 Resistance to vancomycin: Secondary | ICD-10-CM | POA: Diagnosis not present

## 2023-04-03 DIAGNOSIS — M6281 Muscle weakness (generalized): Secondary | ICD-10-CM | POA: Diagnosis not present

## 2023-04-05 DIAGNOSIS — N390 Urinary tract infection, site not specified: Secondary | ICD-10-CM | POA: Diagnosis not present

## 2023-04-05 DIAGNOSIS — A419 Sepsis, unspecified organism: Secondary | ICD-10-CM | POA: Diagnosis not present

## 2023-04-05 DIAGNOSIS — B952 Enterococcus as the cause of diseases classified elsewhere: Secondary | ICD-10-CM | POA: Diagnosis not present

## 2023-04-05 DIAGNOSIS — I69198 Other sequelae of nontraumatic intracerebral hemorrhage: Secondary | ICD-10-CM | POA: Diagnosis not present

## 2023-04-05 DIAGNOSIS — I5032 Chronic diastolic (congestive) heart failure: Secondary | ICD-10-CM | POA: Diagnosis not present

## 2023-04-05 DIAGNOSIS — I130 Hypertensive heart and chronic kidney disease with heart failure and stage 1 through stage 4 chronic kidney disease, or unspecified chronic kidney disease: Secondary | ICD-10-CM | POA: Diagnosis not present

## 2023-04-05 DIAGNOSIS — M6281 Muscle weakness (generalized): Secondary | ICD-10-CM | POA: Diagnosis not present

## 2023-04-05 DIAGNOSIS — L89152 Pressure ulcer of sacral region, stage 2: Secondary | ICD-10-CM | POA: Diagnosis not present

## 2023-04-05 DIAGNOSIS — Z1621 Resistance to vancomycin: Secondary | ICD-10-CM | POA: Diagnosis not present

## 2023-04-06 DIAGNOSIS — Z1621 Resistance to vancomycin: Secondary | ICD-10-CM | POA: Diagnosis not present

## 2023-04-06 DIAGNOSIS — M6281 Muscle weakness (generalized): Secondary | ICD-10-CM | POA: Diagnosis not present

## 2023-04-06 DIAGNOSIS — I5032 Chronic diastolic (congestive) heart failure: Secondary | ICD-10-CM | POA: Diagnosis not present

## 2023-04-06 DIAGNOSIS — L89152 Pressure ulcer of sacral region, stage 2: Secondary | ICD-10-CM | POA: Diagnosis not present

## 2023-04-06 DIAGNOSIS — N390 Urinary tract infection, site not specified: Secondary | ICD-10-CM | POA: Diagnosis not present

## 2023-04-06 DIAGNOSIS — I130 Hypertensive heart and chronic kidney disease with heart failure and stage 1 through stage 4 chronic kidney disease, or unspecified chronic kidney disease: Secondary | ICD-10-CM | POA: Diagnosis not present

## 2023-04-06 DIAGNOSIS — A419 Sepsis, unspecified organism: Secondary | ICD-10-CM | POA: Diagnosis not present

## 2023-04-06 DIAGNOSIS — B952 Enterococcus as the cause of diseases classified elsewhere: Secondary | ICD-10-CM | POA: Diagnosis not present

## 2023-04-06 DIAGNOSIS — I69198 Other sequelae of nontraumatic intracerebral hemorrhage: Secondary | ICD-10-CM | POA: Diagnosis not present

## 2023-04-10 DIAGNOSIS — Z1621 Resistance to vancomycin: Secondary | ICD-10-CM | POA: Diagnosis not present

## 2023-04-10 DIAGNOSIS — I5032 Chronic diastolic (congestive) heart failure: Secondary | ICD-10-CM | POA: Diagnosis not present

## 2023-04-10 DIAGNOSIS — A419 Sepsis, unspecified organism: Secondary | ICD-10-CM | POA: Diagnosis not present

## 2023-04-10 DIAGNOSIS — N390 Urinary tract infection, site not specified: Secondary | ICD-10-CM | POA: Diagnosis not present

## 2023-04-10 DIAGNOSIS — I130 Hypertensive heart and chronic kidney disease with heart failure and stage 1 through stage 4 chronic kidney disease, or unspecified chronic kidney disease: Secondary | ICD-10-CM | POA: Diagnosis not present

## 2023-04-10 DIAGNOSIS — M6281 Muscle weakness (generalized): Secondary | ICD-10-CM | POA: Diagnosis not present

## 2023-04-10 DIAGNOSIS — B952 Enterococcus as the cause of diseases classified elsewhere: Secondary | ICD-10-CM | POA: Diagnosis not present

## 2023-04-10 DIAGNOSIS — L89152 Pressure ulcer of sacral region, stage 2: Secondary | ICD-10-CM | POA: Diagnosis not present

## 2023-04-10 DIAGNOSIS — I69198 Other sequelae of nontraumatic intracerebral hemorrhage: Secondary | ICD-10-CM | POA: Diagnosis not present

## 2023-04-12 DIAGNOSIS — N390 Urinary tract infection, site not specified: Secondary | ICD-10-CM | POA: Diagnosis not present

## 2023-04-12 DIAGNOSIS — I69198 Other sequelae of nontraumatic intracerebral hemorrhage: Secondary | ICD-10-CM | POA: Diagnosis not present

## 2023-04-12 DIAGNOSIS — I130 Hypertensive heart and chronic kidney disease with heart failure and stage 1 through stage 4 chronic kidney disease, or unspecified chronic kidney disease: Secondary | ICD-10-CM | POA: Diagnosis not present

## 2023-04-12 DIAGNOSIS — A419 Sepsis, unspecified organism: Secondary | ICD-10-CM | POA: Diagnosis not present

## 2023-04-12 DIAGNOSIS — B952 Enterococcus as the cause of diseases classified elsewhere: Secondary | ICD-10-CM | POA: Diagnosis not present

## 2023-04-12 DIAGNOSIS — Z1621 Resistance to vancomycin: Secondary | ICD-10-CM | POA: Diagnosis not present

## 2023-04-12 DIAGNOSIS — I5032 Chronic diastolic (congestive) heart failure: Secondary | ICD-10-CM | POA: Diagnosis not present

## 2023-04-12 DIAGNOSIS — L89152 Pressure ulcer of sacral region, stage 2: Secondary | ICD-10-CM | POA: Diagnosis not present

## 2023-04-12 DIAGNOSIS — M6281 Muscle weakness (generalized): Secondary | ICD-10-CM | POA: Diagnosis not present

## 2023-04-14 ENCOUNTER — Ambulatory Visit: Payer: Medicare HMO

## 2023-04-23 ENCOUNTER — Ambulatory Visit: Payer: Medicare HMO | Admitting: Physician Assistant

## 2023-05-25 DIAGNOSIS — I5032 Chronic diastolic (congestive) heart failure: Secondary | ICD-10-CM | POA: Diagnosis not present

## 2023-05-25 DIAGNOSIS — I4891 Unspecified atrial fibrillation: Secondary | ICD-10-CM | POA: Diagnosis not present

## 2023-05-25 DIAGNOSIS — E785 Hyperlipidemia, unspecified: Secondary | ICD-10-CM | POA: Diagnosis not present

## 2023-05-25 DIAGNOSIS — I739 Peripheral vascular disease, unspecified: Secondary | ICD-10-CM | POA: Diagnosis not present

## 2023-05-25 DIAGNOSIS — I10 Essential (primary) hypertension: Secondary | ICD-10-CM | POA: Diagnosis not present

## 2023-05-25 DIAGNOSIS — E1142 Type 2 diabetes mellitus with diabetic polyneuropathy: Secondary | ICD-10-CM | POA: Diagnosis not present

## 2023-08-23 DEATH — deceased
